# Patient Record
Sex: Male | Born: 1935 | Race: White | Hispanic: No | Marital: Married | Smoking: Former smoker
Health system: Southern US, Community
[De-identification: ages and names within clinical notes are randomized; demographics above are authoritative.]

## PROBLEM LIST (undated history)

## (undated) DIAGNOSIS — C679 Malignant neoplasm of bladder, unspecified: Secondary | ICD-10-CM

## (undated) DIAGNOSIS — Z955 Presence of coronary angioplasty implant and graft: Secondary | ICD-10-CM

## (undated) DIAGNOSIS — N201 Calculus of ureter: Secondary | ICD-10-CM

## (undated) DIAGNOSIS — M51369 Other intervertebral disc degeneration, lumbar region without mention of lumbar back pain or lower extremity pain: Secondary | ICD-10-CM

## (undated) DIAGNOSIS — M5416 Radiculopathy, lumbar region: Secondary | ICD-10-CM

## (undated) DIAGNOSIS — T8859XA Other complications of anesthesia, initial encounter: Secondary | ICD-10-CM

## (undated) DIAGNOSIS — Z87442 Personal history of urinary calculi: Secondary | ICD-10-CM

## (undated) DIAGNOSIS — N200 Calculus of kidney: Secondary | ICD-10-CM

## (undated) DIAGNOSIS — E785 Hyperlipidemia, unspecified: Secondary | ICD-10-CM

## (undated) DIAGNOSIS — G629 Polyneuropathy, unspecified: Secondary | ICD-10-CM

## (undated) DIAGNOSIS — I2510 Atherosclerotic heart disease of native coronary artery without angina pectoris: Secondary | ICD-10-CM

## (undated) DIAGNOSIS — E119 Type 2 diabetes mellitus without complications: Secondary | ICD-10-CM

## (undated) DIAGNOSIS — M5136 Other intervertebral disc degeneration, lumbar region: Secondary | ICD-10-CM

## (undated) DIAGNOSIS — I10 Essential (primary) hypertension: Secondary | ICD-10-CM

## (undated) DIAGNOSIS — F32A Depression, unspecified: Secondary | ICD-10-CM

## (undated) DIAGNOSIS — R29898 Other symptoms and signs involving the musculoskeletal system: Secondary | ICD-10-CM

## (undated) DIAGNOSIS — D649 Anemia, unspecified: Secondary | ICD-10-CM

## (undated) DIAGNOSIS — M1990 Unspecified osteoarthritis, unspecified site: Secondary | ICD-10-CM

## (undated) DIAGNOSIS — Z974 Presence of external hearing-aid: Secondary | ICD-10-CM

## (undated) DIAGNOSIS — K219 Gastro-esophageal reflux disease without esophagitis: Secondary | ICD-10-CM

## (undated) HISTORY — PX: CORONARY ANGIOPLASTY WITH STENT PLACEMENT: SHX49

## (undated) HISTORY — DX: Hyperlipidemia, unspecified: E78.5

## (undated) HISTORY — PX: CARDIOVASCULAR STRESS TEST: SHX262

## (undated) HISTORY — PX: CORONARY ANGIOPLASTY: SHX604

## (undated) HISTORY — PX: LAPAROSCOPIC CHOLECYSTECTOMY: SUR755

## (undated) HISTORY — PX: URETEROLITHOTOMY: SHX71

## (undated) HISTORY — PX: TONSILLECTOMY AND ADENOIDECTOMY: SUR1326

## (undated) HISTORY — DX: Essential (primary) hypertension: I10

## (undated) HISTORY — PX: CARDIAC CATHETERIZATION: SHX172

## (undated) HISTORY — PX: EXTRACORPOREAL SHOCK WAVE LITHOTRIPSY: SHX1557

## (undated) HISTORY — PX: CATARACT EXTRACTION W/ INTRAOCULAR LENS  IMPLANT, BILATERAL: SHX1307

---

## 1998-12-27 ENCOUNTER — Encounter: Admission: RE | Admit: 1998-12-27 | Discharge: 1998-12-27 | Payer: Self-pay | Admitting: Family Medicine

## 1999-01-11 ENCOUNTER — Encounter: Payer: Self-pay | Admitting: Family Medicine

## 1999-01-11 ENCOUNTER — Encounter: Admission: RE | Admit: 1999-01-11 | Discharge: 1999-01-11 | Payer: Self-pay | Admitting: Family Medicine

## 1999-05-14 ENCOUNTER — Inpatient Hospital Stay (HOSPITAL_COMMUNITY): Admission: EM | Admit: 1999-05-14 | Discharge: 1999-05-15 | Payer: Self-pay | Admitting: Family Medicine

## 2000-04-18 ENCOUNTER — Encounter: Payer: Self-pay | Admitting: Cardiology

## 2000-04-18 ENCOUNTER — Inpatient Hospital Stay (HOSPITAL_COMMUNITY): Admission: EM | Admit: 2000-04-18 | Discharge: 2000-04-20 | Payer: Self-pay | Admitting: Emergency Medicine

## 2001-03-05 ENCOUNTER — Ambulatory Visit (HOSPITAL_COMMUNITY): Admission: RE | Admit: 2001-03-05 | Discharge: 2001-03-05 | Payer: Self-pay | Admitting: Gastroenterology

## 2002-01-13 ENCOUNTER — Emergency Department (HOSPITAL_COMMUNITY): Admission: EM | Admit: 2002-01-13 | Discharge: 2002-01-14 | Payer: Self-pay | Admitting: *Deleted

## 2008-10-02 DIAGNOSIS — E785 Hyperlipidemia, unspecified: Secondary | ICD-10-CM | POA: Insufficient documentation

## 2008-10-02 DIAGNOSIS — I10 Essential (primary) hypertension: Secondary | ICD-10-CM | POA: Insufficient documentation

## 2008-10-02 DIAGNOSIS — I77819 Aortic ectasia, unspecified site: Secondary | ICD-10-CM | POA: Insufficient documentation

## 2008-12-25 DIAGNOSIS — K219 Gastro-esophageal reflux disease without esophagitis: Secondary | ICD-10-CM | POA: Insufficient documentation

## 2008-12-25 DIAGNOSIS — E1165 Type 2 diabetes mellitus with hyperglycemia: Secondary | ICD-10-CM | POA: Insufficient documentation

## 2008-12-25 DIAGNOSIS — G629 Polyneuropathy, unspecified: Secondary | ICD-10-CM | POA: Insufficient documentation

## 2008-12-25 DIAGNOSIS — G4700 Insomnia, unspecified: Secondary | ICD-10-CM | POA: Insufficient documentation

## 2009-05-25 DIAGNOSIS — N529 Male erectile dysfunction, unspecified: Secondary | ICD-10-CM | POA: Insufficient documentation

## 2009-05-25 DIAGNOSIS — N200 Calculus of kidney: Secondary | ICD-10-CM | POA: Insufficient documentation

## 2009-05-25 DIAGNOSIS — E669 Obesity, unspecified: Secondary | ICD-10-CM | POA: Insufficient documentation

## 2009-05-25 DIAGNOSIS — E8881 Metabolic syndrome: Secondary | ICD-10-CM | POA: Insufficient documentation

## 2010-03-22 DIAGNOSIS — E1149 Type 2 diabetes mellitus with other diabetic neurological complication: Secondary | ICD-10-CM | POA: Insufficient documentation

## 2011-04-11 DIAGNOSIS — I10 Essential (primary) hypertension: Secondary | ICD-10-CM | POA: Diagnosis not present

## 2011-04-11 DIAGNOSIS — Z9861 Coronary angioplasty status: Secondary | ICD-10-CM | POA: Diagnosis not present

## 2011-04-11 DIAGNOSIS — E785 Hyperlipidemia, unspecified: Secondary | ICD-10-CM | POA: Diagnosis not present

## 2011-04-11 DIAGNOSIS — E109 Type 1 diabetes mellitus without complications: Secondary | ICD-10-CM | POA: Diagnosis not present

## 2011-04-11 DIAGNOSIS — I2510 Atherosclerotic heart disease of native coronary artery without angina pectoris: Secondary | ICD-10-CM | POA: Diagnosis not present

## 2011-05-23 DIAGNOSIS — H9190 Unspecified hearing loss, unspecified ear: Secondary | ICD-10-CM | POA: Insufficient documentation

## 2011-05-25 DIAGNOSIS — C4441 Basal cell carcinoma of skin of scalp and neck: Secondary | ICD-10-CM | POA: Diagnosis not present

## 2011-08-21 DIAGNOSIS — E669 Obesity, unspecified: Secondary | ICD-10-CM | POA: Diagnosis not present

## 2011-08-21 DIAGNOSIS — G4700 Insomnia, unspecified: Secondary | ICD-10-CM | POA: Diagnosis not present

## 2011-08-21 DIAGNOSIS — E1149 Type 2 diabetes mellitus with other diabetic neurological complication: Secondary | ICD-10-CM | POA: Diagnosis not present

## 2011-08-21 DIAGNOSIS — M549 Dorsalgia, unspecified: Secondary | ICD-10-CM | POA: Insufficient documentation

## 2011-08-21 DIAGNOSIS — Z0000 Encounter for general adult medical examination without abnormal findings: Secondary | ICD-10-CM | POA: Diagnosis not present

## 2011-12-26 DIAGNOSIS — Z2839 Other underimmunization status: Secondary | ICD-10-CM | POA: Insufficient documentation

## 2011-12-29 DIAGNOSIS — H40019 Open angle with borderline findings, low risk, unspecified eye: Secondary | ICD-10-CM | POA: Diagnosis not present

## 2011-12-29 DIAGNOSIS — E119 Type 2 diabetes mellitus without complications: Secondary | ICD-10-CM | POA: Diagnosis not present

## 2011-12-29 DIAGNOSIS — Z961 Presence of intraocular lens: Secondary | ICD-10-CM | POA: Diagnosis not present

## 2011-12-29 DIAGNOSIS — H04129 Dry eye syndrome of unspecified lacrimal gland: Secondary | ICD-10-CM | POA: Diagnosis not present

## 2011-12-29 DIAGNOSIS — H524 Presbyopia: Secondary | ICD-10-CM | POA: Diagnosis not present

## 2011-12-29 DIAGNOSIS — E11319 Type 2 diabetes mellitus with unspecified diabetic retinopathy without macular edema: Secondary | ICD-10-CM | POA: Diagnosis not present

## 2012-01-25 DIAGNOSIS — L821 Other seborrheic keratosis: Secondary | ICD-10-CM | POA: Diagnosis not present

## 2012-01-25 DIAGNOSIS — L570 Actinic keratosis: Secondary | ICD-10-CM | POA: Diagnosis not present

## 2012-01-25 DIAGNOSIS — Z85828 Personal history of other malignant neoplasm of skin: Secondary | ICD-10-CM | POA: Diagnosis not present

## 2012-02-19 DIAGNOSIS — M810 Age-related osteoporosis without current pathological fracture: Secondary | ICD-10-CM | POA: Insufficient documentation

## 2012-02-19 DIAGNOSIS — E291 Testicular hypofunction: Secondary | ICD-10-CM | POA: Diagnosis not present

## 2012-02-19 DIAGNOSIS — E1149 Type 2 diabetes mellitus with other diabetic neurological complication: Secondary | ICD-10-CM | POA: Diagnosis not present

## 2012-02-19 DIAGNOSIS — E559 Vitamin D deficiency, unspecified: Secondary | ICD-10-CM | POA: Insufficient documentation

## 2012-03-20 HISTORY — PX: COLONOSCOPY WITH PROPOFOL: SHX5780

## 2012-06-06 DIAGNOSIS — N529 Male erectile dysfunction, unspecified: Secondary | ICD-10-CM | POA: Diagnosis not present

## 2012-06-06 DIAGNOSIS — E559 Vitamin D deficiency, unspecified: Secondary | ICD-10-CM | POA: Diagnosis not present

## 2012-07-25 DIAGNOSIS — L820 Inflamed seborrheic keratosis: Secondary | ICD-10-CM | POA: Diagnosis not present

## 2012-07-25 DIAGNOSIS — C44519 Basal cell carcinoma of skin of other part of trunk: Secondary | ICD-10-CM | POA: Diagnosis not present

## 2012-07-25 DIAGNOSIS — L570 Actinic keratosis: Secondary | ICD-10-CM | POA: Diagnosis not present

## 2012-07-25 DIAGNOSIS — Z85828 Personal history of other malignant neoplasm of skin: Secondary | ICD-10-CM | POA: Diagnosis not present

## 2012-07-25 DIAGNOSIS — L909 Atrophic disorder of skin, unspecified: Secondary | ICD-10-CM | POA: Diagnosis not present

## 2012-07-25 DIAGNOSIS — D1801 Hemangioma of skin and subcutaneous tissue: Secondary | ICD-10-CM | POA: Diagnosis not present

## 2012-07-25 DIAGNOSIS — D485 Neoplasm of uncertain behavior of skin: Secondary | ICD-10-CM | POA: Diagnosis not present

## 2012-07-25 DIAGNOSIS — L821 Other seborrheic keratosis: Secondary | ICD-10-CM | POA: Diagnosis not present

## 2012-08-26 DIAGNOSIS — M810 Age-related osteoporosis without current pathological fracture: Secondary | ICD-10-CM | POA: Diagnosis not present

## 2012-08-26 DIAGNOSIS — E559 Vitamin D deficiency, unspecified: Secondary | ICD-10-CM | POA: Diagnosis not present

## 2012-08-26 DIAGNOSIS — Z125 Encounter for screening for malignant neoplasm of prostate: Secondary | ICD-10-CM | POA: Diagnosis not present

## 2012-08-26 DIAGNOSIS — E1149 Type 2 diabetes mellitus with other diabetic neurological complication: Secondary | ICD-10-CM | POA: Diagnosis not present

## 2012-08-26 DIAGNOSIS — N3941 Urge incontinence: Secondary | ICD-10-CM | POA: Insufficient documentation

## 2012-08-26 DIAGNOSIS — N200 Calculus of kidney: Secondary | ICD-10-CM | POA: Diagnosis not present

## 2012-08-26 DIAGNOSIS — E669 Obesity, unspecified: Secondary | ICD-10-CM | POA: Diagnosis not present

## 2012-08-26 DIAGNOSIS — Z0000 Encounter for general adult medical examination without abnormal findings: Secondary | ICD-10-CM | POA: Diagnosis not present

## 2012-08-26 DIAGNOSIS — E291 Testicular hypofunction: Secondary | ICD-10-CM | POA: Diagnosis not present

## 2012-08-26 DIAGNOSIS — Z1331 Encounter for screening for depression: Secondary | ICD-10-CM | POA: Diagnosis not present

## 2012-09-06 DIAGNOSIS — H40019 Open angle with borderline findings, low risk, unspecified eye: Secondary | ICD-10-CM | POA: Diagnosis not present

## 2012-09-06 DIAGNOSIS — H04129 Dry eye syndrome of unspecified lacrimal gland: Secondary | ICD-10-CM | POA: Diagnosis not present

## 2012-10-21 DIAGNOSIS — M20019 Mallet finger of unspecified finger(s): Secondary | ICD-10-CM | POA: Diagnosis not present

## 2012-10-22 DIAGNOSIS — M20019 Mallet finger of unspecified finger(s): Secondary | ICD-10-CM | POA: Diagnosis not present

## 2012-10-30 DIAGNOSIS — M20019 Mallet finger of unspecified finger(s): Secondary | ICD-10-CM | POA: Diagnosis not present

## 2012-11-12 DIAGNOSIS — M20019 Mallet finger of unspecified finger(s): Secondary | ICD-10-CM | POA: Diagnosis not present

## 2012-12-02 DIAGNOSIS — M20019 Mallet finger of unspecified finger(s): Secondary | ICD-10-CM | POA: Diagnosis not present

## 2012-12-03 DIAGNOSIS — M20019 Mallet finger of unspecified finger(s): Secondary | ICD-10-CM | POA: Diagnosis not present

## 2012-12-17 DIAGNOSIS — K5289 Other specified noninfective gastroenteritis and colitis: Secondary | ICD-10-CM | POA: Diagnosis not present

## 2012-12-17 DIAGNOSIS — E1149 Type 2 diabetes mellitus with other diabetic neurological complication: Secondary | ICD-10-CM | POA: Diagnosis not present

## 2012-12-17 DIAGNOSIS — K219 Gastro-esophageal reflux disease without esophagitis: Secondary | ICD-10-CM | POA: Diagnosis not present

## 2012-12-17 DIAGNOSIS — I10 Essential (primary) hypertension: Secondary | ICD-10-CM | POA: Diagnosis not present

## 2013-01-02 DIAGNOSIS — E119 Type 2 diabetes mellitus without complications: Secondary | ICD-10-CM | POA: Diagnosis not present

## 2013-01-02 DIAGNOSIS — Z961 Presence of intraocular lens: Secondary | ICD-10-CM | POA: Diagnosis not present

## 2013-01-02 DIAGNOSIS — H40019 Open angle with borderline findings, low risk, unspecified eye: Secondary | ICD-10-CM | POA: Diagnosis not present

## 2013-01-20 DIAGNOSIS — IMO0001 Reserved for inherently not codable concepts without codable children: Secondary | ICD-10-CM | POA: Diagnosis not present

## 2013-01-20 DIAGNOSIS — Z23 Encounter for immunization: Secondary | ICD-10-CM | POA: Diagnosis not present

## 2013-01-20 DIAGNOSIS — I10 Essential (primary) hypertension: Secondary | ICD-10-CM | POA: Diagnosis not present

## 2013-01-20 DIAGNOSIS — G589 Mononeuropathy, unspecified: Secondary | ICD-10-CM | POA: Diagnosis not present

## 2013-01-20 DIAGNOSIS — E291 Testicular hypofunction: Secondary | ICD-10-CM | POA: Diagnosis not present

## 2013-01-20 DIAGNOSIS — E1149 Type 2 diabetes mellitus with other diabetic neurological complication: Secondary | ICD-10-CM | POA: Diagnosis not present

## 2013-01-20 DIAGNOSIS — E785 Hyperlipidemia, unspecified: Secondary | ICD-10-CM | POA: Diagnosis not present

## 2013-02-03 DIAGNOSIS — L570 Actinic keratosis: Secondary | ICD-10-CM | POA: Diagnosis not present

## 2013-02-03 DIAGNOSIS — D1801 Hemangioma of skin and subcutaneous tissue: Secondary | ICD-10-CM | POA: Diagnosis not present

## 2013-02-03 DIAGNOSIS — L819 Disorder of pigmentation, unspecified: Secondary | ICD-10-CM | POA: Diagnosis not present

## 2013-02-03 DIAGNOSIS — L821 Other seborrheic keratosis: Secondary | ICD-10-CM | POA: Diagnosis not present

## 2013-02-03 DIAGNOSIS — L738 Other specified follicular disorders: Secondary | ICD-10-CM | POA: Diagnosis not present

## 2013-02-03 DIAGNOSIS — Z85828 Personal history of other malignant neoplasm of skin: Secondary | ICD-10-CM | POA: Diagnosis not present

## 2013-04-15 DIAGNOSIS — I10 Essential (primary) hypertension: Secondary | ICD-10-CM | POA: Diagnosis not present

## 2013-04-15 DIAGNOSIS — Z9861 Coronary angioplasty status: Secondary | ICD-10-CM | POA: Diagnosis not present

## 2013-04-15 DIAGNOSIS — I2510 Atherosclerotic heart disease of native coronary artery without angina pectoris: Secondary | ICD-10-CM | POA: Diagnosis not present

## 2013-04-15 DIAGNOSIS — E109 Type 1 diabetes mellitus without complications: Secondary | ICD-10-CM | POA: Diagnosis not present

## 2013-04-15 DIAGNOSIS — E785 Hyperlipidemia, unspecified: Secondary | ICD-10-CM | POA: Diagnosis not present

## 2013-08-07 DIAGNOSIS — D485 Neoplasm of uncertain behavior of skin: Secondary | ICD-10-CM | POA: Diagnosis not present

## 2013-08-07 DIAGNOSIS — C4441 Basal cell carcinoma of skin of scalp and neck: Secondary | ICD-10-CM | POA: Diagnosis not present

## 2013-08-07 DIAGNOSIS — Z85828 Personal history of other malignant neoplasm of skin: Secondary | ICD-10-CM | POA: Diagnosis not present

## 2013-08-07 DIAGNOSIS — L821 Other seborrheic keratosis: Secondary | ICD-10-CM | POA: Diagnosis not present

## 2013-08-07 DIAGNOSIS — L570 Actinic keratosis: Secondary | ICD-10-CM | POA: Diagnosis not present

## 2013-08-07 DIAGNOSIS — L578 Other skin changes due to chronic exposure to nonionizing radiation: Secondary | ICD-10-CM | POA: Diagnosis not present

## 2013-08-25 DIAGNOSIS — K219 Gastro-esophageal reflux disease without esophagitis: Secondary | ICD-10-CM | POA: Diagnosis not present

## 2013-08-26 ENCOUNTER — Ambulatory Visit (INDEPENDENT_AMBULATORY_CARE_PROVIDER_SITE_OTHER): Payer: BC Managed Care – PPO

## 2013-08-26 ENCOUNTER — Ambulatory Visit (INDEPENDENT_AMBULATORY_CARE_PROVIDER_SITE_OTHER): Payer: BC Managed Care – PPO | Admitting: Podiatry

## 2013-08-26 ENCOUNTER — Encounter: Payer: Self-pay | Admitting: Podiatry

## 2013-08-26 VITALS — BP 114/81 | HR 74 | Resp 16 | Ht 68.0 in | Wt 178.0 lb

## 2013-08-26 DIAGNOSIS — M7750 Other enthesopathy of unspecified foot: Secondary | ICD-10-CM

## 2013-08-26 DIAGNOSIS — M779 Enthesopathy, unspecified: Secondary | ICD-10-CM | POA: Diagnosis not present

## 2013-08-26 DIAGNOSIS — M778 Other enthesopathies, not elsewhere classified: Secondary | ICD-10-CM

## 2013-08-26 DIAGNOSIS — M109 Gout, unspecified: Secondary | ICD-10-CM | POA: Diagnosis not present

## 2013-08-26 NOTE — Progress Notes (Signed)
   Subjective:    Patient ID: Alejandro Foster, male    DOB: February 01, 1934, 80 y.o.   MRN: [id number]  HPI Comments: "I just woke up with my foot like this"  Patient c/o throbbing dorsal left foot for 1 day. He suddenly woke up with his foot red and swollen. No injury the previous day. He can't bend his toes or wear shoes comfortably. He has only tried Advil with no relief.  Foot Pain      Review of Systems  HENT: Positive for hearing loss.   All other systems reviewed and are negative.      Objective:   Physical Exam: I have reviewed his past medical history medications allergies surgeries social history and review of systems. Pulses are strongly palpable bilateral. Neurologic sensorium is intact per since once the monofilament. Deep tendon reflexes are intact bilateral. Muscle strength is intact in normal for his age. Orthopedic evaluation demonstrates all joints distal to the ankle a full range of motion without crepitation. He has swelling with overlying erythema first metatarsal medial cuneiform joint which is warm on palpation and painful to the touch with the area of hyperintense erythema demonstrating the majority of the pain. This appears to be the nidus. Radiographic evaluation does not demonstrate any type of osseous abnormalities in this area.        Assessment & Plan:  Assessment: Acute gouty capsulitis first metatarsal medial cuneiform joint left foot possibly associated with renal issues from diabetes or possibly associated with hydrochlorothiazide.  Plan: Injected the site today with Kenalog and local anesthetic. I will followup with him in a few weeks.

## 2013-08-28 DIAGNOSIS — M109 Gout, unspecified: Secondary | ICD-10-CM | POA: Insufficient documentation

## 2013-08-28 DIAGNOSIS — H6120 Impacted cerumen, unspecified ear: Secondary | ICD-10-CM | POA: Diagnosis not present

## 2013-08-28 DIAGNOSIS — N3941 Urge incontinence: Secondary | ICD-10-CM | POA: Diagnosis not present

## 2013-08-28 DIAGNOSIS — E871 Hypo-osmolality and hyponatremia: Secondary | ICD-10-CM | POA: Insufficient documentation

## 2013-08-28 DIAGNOSIS — Z1331 Encounter for screening for depression: Secondary | ICD-10-CM | POA: Diagnosis not present

## 2013-08-28 DIAGNOSIS — M810 Age-related osteoporosis without current pathological fracture: Secondary | ICD-10-CM | POA: Diagnosis not present

## 2013-08-28 DIAGNOSIS — E1149 Type 2 diabetes mellitus with other diabetic neurological complication: Secondary | ICD-10-CM | POA: Diagnosis not present

## 2013-08-28 DIAGNOSIS — E559 Vitamin D deficiency, unspecified: Secondary | ICD-10-CM | POA: Diagnosis not present

## 2013-08-28 DIAGNOSIS — Z0000 Encounter for general adult medical examination without abnormal findings: Secondary | ICD-10-CM | POA: Diagnosis not present

## 2013-09-04 DIAGNOSIS — J069 Acute upper respiratory infection, unspecified: Secondary | ICD-10-CM | POA: Diagnosis not present

## 2013-09-04 DIAGNOSIS — H6120 Impacted cerumen, unspecified ear: Secondary | ICD-10-CM | POA: Diagnosis not present

## 2013-09-09 ENCOUNTER — Ambulatory Visit: Payer: BC Managed Care – PPO | Admitting: Podiatry

## 2013-09-30 DIAGNOSIS — K219 Gastro-esophageal reflux disease without esophagitis: Secondary | ICD-10-CM | POA: Diagnosis not present

## 2013-11-04 DIAGNOSIS — Z961 Presence of intraocular lens: Secondary | ICD-10-CM | POA: Diagnosis not present

## 2013-11-04 DIAGNOSIS — E119 Type 2 diabetes mellitus without complications: Secondary | ICD-10-CM | POA: Diagnosis not present

## 2014-02-05 DIAGNOSIS — H6123 Impacted cerumen, bilateral: Secondary | ICD-10-CM | POA: Diagnosis not present

## 2014-02-05 DIAGNOSIS — H9312 Tinnitus, left ear: Secondary | ICD-10-CM | POA: Diagnosis not present

## 2014-02-23 DIAGNOSIS — L821 Other seborrheic keratosis: Secondary | ICD-10-CM | POA: Diagnosis not present

## 2014-02-23 DIAGNOSIS — L918 Other hypertrophic disorders of the skin: Secondary | ICD-10-CM | POA: Diagnosis not present

## 2014-02-23 DIAGNOSIS — L820 Inflamed seborrheic keratosis: Secondary | ICD-10-CM | POA: Diagnosis not present

## 2014-02-23 DIAGNOSIS — Z85828 Personal history of other malignant neoplasm of skin: Secondary | ICD-10-CM | POA: Diagnosis not present

## 2014-02-23 DIAGNOSIS — L853 Xerosis cutis: Secondary | ICD-10-CM | POA: Diagnosis not present

## 2014-02-23 DIAGNOSIS — L570 Actinic keratosis: Secondary | ICD-10-CM | POA: Diagnosis not present

## 2014-02-24 DIAGNOSIS — E1149 Type 2 diabetes mellitus with other diabetic neurological complication: Secondary | ICD-10-CM | POA: Diagnosis not present

## 2014-02-24 DIAGNOSIS — E1165 Type 2 diabetes mellitus with hyperglycemia: Secondary | ICD-10-CM | POA: Diagnosis not present

## 2014-02-24 DIAGNOSIS — I10 Essential (primary) hypertension: Secondary | ICD-10-CM | POA: Diagnosis not present

## 2014-02-24 DIAGNOSIS — Z6828 Body mass index (BMI) 28.0-28.9, adult: Secondary | ICD-10-CM | POA: Diagnosis not present

## 2014-02-24 DIAGNOSIS — J069 Acute upper respiratory infection, unspecified: Secondary | ICD-10-CM | POA: Diagnosis not present

## 2014-02-24 DIAGNOSIS — E871 Hypo-osmolality and hyponatremia: Secondary | ICD-10-CM | POA: Diagnosis not present

## 2014-02-24 DIAGNOSIS — G4700 Insomnia, unspecified: Secondary | ICD-10-CM | POA: Diagnosis not present

## 2014-02-24 DIAGNOSIS — E785 Hyperlipidemia, unspecified: Secondary | ICD-10-CM | POA: Diagnosis not present

## 2014-07-02 ENCOUNTER — Ambulatory Visit: Payer: Medicare Other

## 2014-07-17 ENCOUNTER — Ambulatory Visit (INDEPENDENT_AMBULATORY_CARE_PROVIDER_SITE_OTHER): Payer: Medicare Other

## 2014-07-17 DIAGNOSIS — M79673 Pain in unspecified foot: Secondary | ICD-10-CM

## 2014-07-17 DIAGNOSIS — B351 Tinea unguium: Secondary | ICD-10-CM

## 2014-07-21 NOTE — Progress Notes (Signed)
HPI Presents today chief complaint of painful elongated toenails.  Objective: Pulses are palpable bilateral nails are thick, yellow dystrophic onychomycosis and painful palpation.   Assessment: Onychomycosis with pain in limb.  Plan: Treatment of nails in thickness and length as covered service secondary to pain.  

## 2014-08-26 DIAGNOSIS — Z0000 Encounter for general adult medical examination without abnormal findings: Secondary | ICD-10-CM | POA: Insufficient documentation

## 2014-09-03 DIAGNOSIS — Z794 Long term (current) use of insulin: Secondary | ICD-10-CM | POA: Insufficient documentation

## 2014-09-03 DIAGNOSIS — I493 Ventricular premature depolarization: Secondary | ICD-10-CM | POA: Insufficient documentation

## 2014-09-03 DIAGNOSIS — I119 Hypertensive heart disease without heart failure: Secondary | ICD-10-CM | POA: Insufficient documentation

## 2014-11-13 ENCOUNTER — Encounter: Payer: Self-pay | Admitting: Podiatry

## 2014-11-13 ENCOUNTER — Ambulatory Visit: Payer: Medicare Other

## 2014-11-13 ENCOUNTER — Ambulatory Visit (INDEPENDENT_AMBULATORY_CARE_PROVIDER_SITE_OTHER): Payer: Medicare Other | Admitting: Podiatry

## 2014-11-13 VITALS — BP 141/72 | HR 66 | Resp 17

## 2014-11-13 DIAGNOSIS — B351 Tinea unguium: Secondary | ICD-10-CM

## 2014-11-13 DIAGNOSIS — M79673 Pain in unspecified foot: Secondary | ICD-10-CM

## 2014-11-13 NOTE — Progress Notes (Signed)
Patient ID: Alejandro Foster, male   DOB: [birth year], 81 y.o.   MRN: [id number] Complaint:  Visit Type: Patient returns to my office for continued preventative foot care services. Complaint: Patient states" my nails have grown long and thick and become painful to walk and wear shoes" Patient has been diagnosed with DM and is taking gabapentin for his neuropathy.. The patient presents for preventative foot care services. No changes to ROS  Podiatric Exam: Vascular: dorsalis pedis and posterior tibial pulses are palpable bilateral. Capillary return is immediate. Temperature gradient is WNL. Skin turgor WNL  Sensorium: Diminished Semmes Weinstein monofilament test. Normal tactile sensation bilaterally. Nail Exam: Pt has thick disfigured discolored nails with subungual debris noted bilateral entire nail hallux  Ulcer Exam: There is no evidence of ulcer or pre-ulcerative changes or infection. Orthopedic Exam: Muscle tone and strength are WNL. No limitations in general ROM. No crepitus or effusions noted. Foot type and digits show no abnormalities. Bony prominences are unremarkable. Skin: No Porokeratosis. No infection or ulcers  Diagnosis:  Onychomycosis, , Pain in right toe, pain in left toes  Treatment & Plan Procedures and Treatment: Consent by patient was obtained for treatment procedures. The patient understood the discussion of treatment and procedures well. All questions were answered thoroughly reviewed. Debridement of mycotic and hypertrophic toenails, 1 through 5 bilateral and clearing of subungual debris. No ulceration, no infection noted.  Return Visit-Office Procedure: Patient instructed to return to the office for a follow up visit 3 months for continued evaluation and treatment.

## 2015-03-04 DIAGNOSIS — L570 Actinic keratosis: Secondary | ICD-10-CM | POA: Diagnosis not present

## 2015-03-04 DIAGNOSIS — Z85828 Personal history of other malignant neoplasm of skin: Secondary | ICD-10-CM | POA: Diagnosis not present

## 2015-03-04 DIAGNOSIS — L821 Other seborrheic keratosis: Secondary | ICD-10-CM | POA: Diagnosis not present

## 2015-03-10 ENCOUNTER — Ambulatory Visit: Payer: Medicare Other | Admitting: Podiatry

## 2015-08-12 ENCOUNTER — Other Ambulatory Visit: Payer: Self-pay | Admitting: Orthopedic Surgery

## 2015-08-12 DIAGNOSIS — M48061 Spinal stenosis, lumbar region without neurogenic claudication: Secondary | ICD-10-CM

## 2015-08-22 ENCOUNTER — Ambulatory Visit
Admission: RE | Admit: 2015-08-22 | Discharge: 2015-08-22 | Disposition: A | Discharge status: Home / Self Care | Payer: Medicare Other | Source: Ambulatory Visit | Attending: Orthopedic Surgery | Admitting: Orthopedic Surgery

## 2015-08-22 DIAGNOSIS — M48061 Spinal stenosis, lumbar region without neurogenic claudication: Secondary | ICD-10-CM

## 2016-01-19 ENCOUNTER — Emergency Department (HOSPITAL_COMMUNITY): Payer: Medicare Other

## 2016-01-19 ENCOUNTER — Encounter (HOSPITAL_COMMUNITY): Payer: Self-pay | Admitting: *Deleted

## 2016-01-19 ENCOUNTER — Emergency Department (HOSPITAL_COMMUNITY)
Admission: EM | Admit: 2016-01-19 | Discharge: 2016-01-19 | Disposition: A | Discharge status: Home / Self Care | Payer: Medicare Other | Attending: Emergency Medicine | Admitting: Emergency Medicine

## 2016-01-19 DIAGNOSIS — E119 Type 2 diabetes mellitus without complications: Secondary | ICD-10-CM | POA: Diagnosis not present

## 2016-01-19 DIAGNOSIS — Z791 Long term (current) use of non-steroidal anti-inflammatories (NSAID): Secondary | ICD-10-CM | POA: Insufficient documentation

## 2016-01-19 DIAGNOSIS — R109 Unspecified abdominal pain: Secondary | ICD-10-CM | POA: Diagnosis present

## 2016-01-19 DIAGNOSIS — Z794 Long term (current) use of insulin: Secondary | ICD-10-CM | POA: Diagnosis not present

## 2016-01-19 DIAGNOSIS — Z87891 Personal history of nicotine dependence: Secondary | ICD-10-CM | POA: Insufficient documentation

## 2016-01-19 DIAGNOSIS — I10 Essential (primary) hypertension: Secondary | ICD-10-CM | POA: Insufficient documentation

## 2016-01-19 DIAGNOSIS — Z79899 Other long term (current) drug therapy: Secondary | ICD-10-CM | POA: Diagnosis not present

## 2016-01-19 DIAGNOSIS — N202 Calculus of kidney with calculus of ureter: Secondary | ICD-10-CM | POA: Diagnosis not present

## 2016-01-19 DIAGNOSIS — N200 Calculus of kidney: Secondary | ICD-10-CM

## 2016-01-19 LAB — URINALYSIS, ROUTINE W REFLEX MICROSCOPIC
BILIRUBIN URINE: NEGATIVE
Glucose, UA: NEGATIVE mg/dL
KETONES UR: NEGATIVE mg/dL
NITRITE: NEGATIVE
Protein, ur: NEGATIVE mg/dL
Specific Gravity, Urine: 1.017 (ref 1.005–1.030)
pH: 7 (ref 5.0–8.0)

## 2016-01-19 LAB — COMPREHENSIVE METABOLIC PANEL
ALBUMIN: 4.6 g/dL (ref 3.5–5.0)
ALK PHOS: 27 U/L — AB (ref 38–126)
ALT: 24 U/L (ref 17–63)
AST: 23 U/L (ref 15–41)
Anion gap: 6 (ref 5–15)
BILIRUBIN TOTAL: 1.4 mg/dL — AB (ref 0.3–1.2)
BUN: 20 mg/dL (ref 6–20)
CALCIUM: 9.9 mg/dL (ref 8.9–10.3)
CO2: 25 mmol/L (ref 22–32)
Chloride: 108 mmol/L (ref 101–111)
Creatinine, Ser: 1.08 mg/dL (ref 0.61–1.24)
GFR calc Af Amer: >60 mL/min (ref >60)
GFR calc non Af Amer: >60 mL/min (ref >60)
GLUCOSE: 106 mg/dL — AB (ref 65–99)
Potassium: 3.9 mmol/L (ref 3.5–5.1)
Sodium: 139 mmol/L (ref 135–145)
TOTAL PROTEIN: 7.7 g/dL (ref 6.5–8.1)

## 2016-01-19 LAB — CBC
HCT: 41.4 % (ref 39.0–52.0)
Hemoglobin: 14.2 g/dL (ref 13.0–17.0)
MCH: 28.9 pg (ref 26.0–34.0)
MCHC: 34.3 g/dL (ref 30.0–36.0)
MCV: 84.1 fL (ref 78.0–100.0)
PLATELETS: 226 10*3/uL (ref 150–400)
RBC: 4.92 MIL/uL (ref 4.22–5.81)
RDW: 13.6 % (ref 11.5–15.5)
WBC: 9.7 10*3/uL (ref 4.0–10.5)

## 2016-01-19 LAB — URINE MICROSCOPIC-ADD ON
Bacteria, UA: NONE SEEN
SQUAMOUS EPITHELIAL / LPF: NONE SEEN

## 2016-01-19 LAB — LIPASE, BLOOD: Lipase: 30 U/L (ref 11–51)

## 2016-01-19 MED ORDER — ONDANSETRON 4 MG PO TBDP: 4.0000 mg | ORAL_TABLET | Freq: Three times a day (TID) | ORAL | 0 refills | Status: DC | PRN

## 2016-01-19 MED ORDER — HYDROCODONE-ACETAMINOPHEN 5-325 MG PO TABS: 1.0000 | ORAL_TABLET | ORAL | 0 refills | Status: DC | PRN

## 2016-01-19 NOTE — Discharge Instructions (Signed)
Take Vicodin as needed for pain. Zofran as needed for nausea.  Strain your  urine.  Call Dr. Junious Silk for follow-up appointment if not passed in 7 days.

## 2016-01-19 NOTE — ED Provider Notes (Signed)
[facility] DEPT Provider Note   CSN: [id number] Arrival date & time: 01/19/16  1600     History   Chief Complaint Chief Complaint  Patient presents with  . Abdominal Pain    HPI Alejandro Foster is a 82 y.o. male. Patient presents for evaluation of right flank pain. States his symptoms started yesterday morning upon awakening. He's had kidney stones and states is been over 20 years since he had an episode. One loose stool today which he thinks is "unrelated". No nausea vomiting. He took a Tylenol 3 at home that he had from previous dental procedure states his pain went from a 7 or 8 down to a 3 or 4/10.  HPI  Past Medical History:  Diagnosis Date  . Diabetes mellitus without complication ([place])   . Hyperlipidemia   . Hypertension     There are no active problems to display for this patient.   History reviewed. No pertinent surgical history.     Home Medications    Prior to Admission medications   Medication Sig Start Date End Date Taking? Authorizing Provider  alendronate (FOSAMAX) 70 MG tablet Take 70 mg by mouth every Monday. Take with a full glass of water on an empty stomach.   Yes Historical Provider, MD  amLODipine (NORVASC) 5 MG tablet Take 5 mg by mouth every evening.    Yes Historical Provider, MD  atorvastatin (LIPITOR) 20 MG tablet Take 20 mg by mouth every evening.    Yes Historical Provider, MD  Calcium Citrate 250 MG TABS Take 250 mg by mouth daily.   Yes Historical Provider, MD  cetirizine (ZYRTEC) 10 MG tablet Take 10 mg by mouth daily.   Yes Historical Provider, MD  Cholecalciferol (VITAMIN D PO) Take 5 drops by mouth every evening.    Yes Historical Provider, MD  [facility] EVENING PRIMROSE OIL PO Take 1 capsule by mouth 3 (three) times daily.   Yes Historical Provider, MD  fenofibrate (TRICOR) 48 MG tablet Take 48 mg by mouth every evening.    Yes Historical Provider, MD  gabapentin (NEURONTIN) 100 MG capsule Take 100-200 mg by mouth 2 (two) times daily.  Take 100mg  at noon and then 200mg  at night   Yes Historical Provider, MD  glipiZIDE-metformin (METAGLIP) 2.5-500 MG per tablet Take 2 tablets by mouth 2 (two) times daily before a meal.    Yes Historical Provider, MD  HUMALOG MIX 75/25 KWIKPEN (75-25) 100 UNIT/ML Kwikpen Inject 30-35 Units into the skin 2 (two) times daily. 35 units in the morning and then 30 units in the evening. 12/31/15  Yes Historical Provider, MD  hydrochlorothiazide (HYDRODIURIL) 25 MG tablet Take 25 mg by mouth daily.   Yes Historical Provider, MD  ibuprofen (ADVIL,MOTRIN) 200 MG tablet Take 200 mg by mouth every 6 (six) hours as needed for headache or moderate pain.    Yes Historical Provider, MD  MAGNESIUM CITRATE PO Take 150 mg by mouth every evening.    Yes Historical Provider, MD  metoprolol tartrate (LOPRESSOR) 25 MG tablet Take 25 mg by mouth daily.   Yes Historical Provider, MD  omega-3 acid ethyl esters (LOVAZA) 1 G capsule Take 1 g by mouth 2 (two) times daily.    Yes Historical Provider, MD  OVER THE COUNTER MEDICATION Take 1-2 capsules by mouth 3 (three) times daily. EFAC pillsTake 1 capsule in the am and at noon and then 2capsules at night   Yes Historical Provider, MD  Pease Take 2  capsules by mouth daily. Prostinol- otc medication   Yes Historical Provider, MD  ramipril (ALTACE) 10 MG capsule Take 20 mg by mouth daily.    Yes Historical Provider, MD  ranitidine (ZANTAC) 150 MG tablet Take 150 mg by mouth at bedtime.    Yes Historical Provider, MD  solifenacin (VESICARE) 5 MG tablet Take 5 mg by mouth daily.   Yes Historical Provider, MD  zolpidem (AMBIEN) 10 MG tablet Take 10 mg by mouth at bedtime.   Yes Historical Provider, MD  HYDROcodone-acetaminophen (NORCO/VICODIN) 5-325 MG tablet Take 1 tablet by mouth every 4 (four) hours as needed. 01/19/16   Tanna Furry, MD  ondansetron (ZOFRAN ODT) 4 MG disintegrating tablet Take 1 tablet (4 mg total) by mouth every 8 (eight) hours as needed for  nausea. 01/19/16   Tanna Furry, MD    Family History No family history on file.  Social History Social History  Substance Use Topics  . Smoking status: Former Smoker    Quit date: 08/26/1988  . Smokeless tobacco: Never Used  . Alcohol use No     Allergies   Sulfa antibiotics   Review of Systems Review of Systems  Constitutional: Negative for appetite change, chills, diaphoresis, fatigue and fever.  HENT: Negative for mouth sores, sore throat and trouble swallowing.   Eyes: Negative for visual disturbance.  Respiratory: Negative for cough, chest tightness, shortness of breath and wheezing.   Cardiovascular: Negative for chest pain.  Gastrointestinal: Positive for nausea. Negative for abdominal distention, abdominal pain, diarrhea and vomiting.  Endocrine: Negative for polydipsia, polyphagia and polyuria.  Genitourinary: Positive for flank pain. Negative for dysuria, frequency and hematuria.  Musculoskeletal: Negative for gait problem.  Skin: Negative for color change, pallor and rash.  Neurological: Negative for dizziness, syncope, light-headedness and headaches.  Hematological: Does not bruise/bleed easily.  Psychiatric/Behavioral: Negative for behavioral problems and confusion.     Physical Exam Updated Vital Signs BP 126/56 (BP Location: Left Arm)   Pulse 63   Temp 97.9 F (36.6 C) (Oral)   Resp 18   SpO2 95%   Physical Exam  Constitutional: He is oriented to person, place, and time. He appears well-developed and well-nourished. No distress.  HENT:  Head: Normocephalic.  Eyes: Conjunctivae are normal. Pupils are equal, round, and reactive to light. No scleral icterus.  Neck: Normal range of motion. Neck supple. No thyromegaly present.  Cardiovascular: Normal rate and regular rhythm.  Exam reveals no gallop and no friction rub.   No murmur heard. Pulmonary/Chest: Effort normal and breath sounds normal. No respiratory distress. He has no wheezes. He has no rales.    Abdominal: Soft. Bowel sounds are normal. He exhibits no distension. There is no tenderness. There is no rebound.    Musculoskeletal: Normal range of motion.  Neurological: He is alert and oriented to person, place, and time.  Skin: Skin is warm and dry. No rash noted.  Psychiatric: He has a normal mood and affect. His behavior is normal.     ED Treatments / Results  Labs (all labs ordered are listed, but only abnormal results are displayed) Labs Reviewed  COMPREHENSIVE METABOLIC PANEL - Abnormal; Notable for the following:       Result Value   Glucose, Bld 106 (*)    Alkaline Phosphatase 27 (*)    Total Bilirubin 1.4 (*)    All other components within normal limits  URINALYSIS, ROUTINE W REFLEX MICROSCOPIC (NOT AT [facility]) - Abnormal; Notable for the following:  Hgb urine dipstick TRACE (*)    Leukocytes, UA SMALL (*)    All other components within normal limits  LIPASE, BLOOD  CBC  URINE MICROSCOPIC-ADD ON    EKG  EKG Interpretation None       Radiology Ct Renal Stone Study  Result Date: 01/19/2016 CLINICAL DATA:  Right lower quadrant pain since yesterday morning, diarrhea and history of kidney stones. EXAM: CT ABDOMEN AND PELVIS WITHOUT CONTRAST TECHNIQUE: Multidetector CT imaging of the abdomen and pelvis was performed following the standard protocol without IV contrast. COMPARISON:  None. FINDINGS: Lower chest: Lung bases show no acute findings. Coronary artery calcification. Heart size normal. No pericardial or pleural effusion. Hepatobiliary: Liver is unremarkable. Cholecystectomy. No biliary ductal dilatation. Pancreas: Negative. Spleen: Negative. Adrenals/Urinary Tract: Adrenal glands are unremarkable. Tiny stones are seen in the kidneys. Low and high attenuation lesions in the left kidney measure up to 3.2 cm on the left, likely a combination of simple and hyperdense cysts but definitive characterization is limited without post-contrast imaging. Right renal edema  with mild right hydronephrosis secondary to a 6 mm proximal right ureteral stone. Ureters are otherwise decompressed. Bladder is unremarkable. Stomach/Bowel: Stomach, small bowel, appendix and colon are unremarkable. Vascular/Lymphatic: Atherosclerotic calcification of the arterial vasculature without abdominal aortic aneurysm. No pathologically enlarged lymph nodes. Reproductive: Prostate is normal in size. Other: No free fluid. Mesenteries and peritoneum are otherwise unremarkable. Musculoskeletal: No worrisome lytic or sclerotic lesions. Degenerative changes are seen in the spine. IMPRESSION: 1. Right renal edema and mild right hydronephrosis secondary to a 6 mm stone in the proximal right ureter. 2. Bilateral renal stones. 3. Aortic atherosclerosis (ICD10-170.0). Coronary artery calcification. Electronically Signed   By: Lorin Picket M.D.   On: 01/19/2016 17:09    Procedures Procedures (including critical care time)  Medications Ordered in ED Medications - No data to display   Initial Impression / Assessment and Plan / ED Course  I have reviewed the triage vital signs and the nursing notes.  Pertinent labs & imaging results that were available during my care of the patient were reviewed by me and considered in my medical decision making (see chart for details).  Clinical Course    CT scan shows proximal ureteral stone. 6 mm. Normal kidney function. No infection. Afebrile. Tolerating symptoms well. For discharge. He has a urologist Dr. Junious Silk. It hurts him to follow-up next week for outpatient appointment.  Final Clinical Impressions(s) / ED Diagnoses   Final diagnoses:  Kidney stone    New Prescriptions Discharge Medication List as of 01/19/2016  6:26 PM       Tanna Furry, MD 01/19/16 1845

## 2016-01-19 NOTE — ED Triage Notes (Addendum)
Per EMS, pt complains of RLQ abdominal pain since yesterday morning, 1 episode of diarrhea today. Pt has hx of kidney stones. Pt denies n/v at this time.   Pt took pain medication at 230PM today. Pain was 7/10 before pain medication, is now 4/10.

## 2016-03-01 ENCOUNTER — Other Ambulatory Visit: Payer: Self-pay | Admitting: Urology

## 2016-03-01 DIAGNOSIS — N201 Calculus of ureter: Secondary | ICD-10-CM

## 2016-03-02 ENCOUNTER — Other Ambulatory Visit: Payer: Self-pay | Admitting: Urology

## 2016-03-24 ENCOUNTER — Encounter (HOSPITAL_BASED_OUTPATIENT_CLINIC_OR_DEPARTMENT_OTHER): Payer: Self-pay | Admitting: *Deleted

## 2016-03-24 NOTE — Progress Notes (Addendum)
NPO AFTER MN.  ARRIVE AT 1015.  NEEDS ISTAT 8 AND KUB.  CURRENT EKG, LOV, STRESS TEST TO BE FAXED FROM DR Wynonia Lawman.  WILL TAKE METOPROLOL AM DOS W/ SIPS OF WATER AND IF NEEDED TAKE PRN PAIN MED. , ONE TYPE, AND ZOFRAN. PT VERBALIZED UNDERSTANDING , WILL DO HALF HUMALOG INSULIN DOSE HS BEFORE DOS , 15 UNITS.  ADDENDUM:  RECEIVED LOV NOTE, STRESS TEST AND CURRENT EKG, PLACED IN CHART.

## 2016-03-27 ENCOUNTER — Encounter (HOSPITAL_BASED_OUTPATIENT_CLINIC_OR_DEPARTMENT_OTHER): Payer: Self-pay | Admitting: *Deleted

## 2016-03-27 NOTE — H&P (Signed)
Office Visit Report 02/24/2016    Alejandro Foster         MRN: [id number]  PRIMARY CARE:  Precious Reel, MD  DOB: [birth year], 83 year old Male  REFERRING:  Georgette Dover, MD  SSN: [id number]  PROVIDER:  Festus Aloe, M.D.    TREATING:  Jimmey Ralph    LOCATION:  [facility] [phone]    CC: I have ureteral stone.  HPI: Alejandro Foster is a 83 year-old male established patient who is here for ureteral stone.  The problem is on the right side. He first stated noticing pain on approximately 01/20/2016. This is not his first kidney stone. He is currently having groin pain. He denies having flank pain, back pain, nausea, vomiting, fever, and chills. Pain is occuring on the right side. He has not caught a stone in his urine strainer since his symptoms began.   He has had eswl, ureteral stent, and ureteroscopy for treatment of his stones in the past.     ALLERGIES: Sulfa Drugs - Other Reaction, childhood   MEDICATIONS: Tamsulosin Hcl 0.4 mg capsule, ext release 24 hr 1 capsule PO Q HS  Vesicare 5 mg tablet 1 tablet PO Daily  Alendronate Sodium 70 MG Oral Tablet Oral  AmLODIPine Besylate 5 MG Oral Tablet Oral  Atorvastatin Calcium 20 MG Oral Tablet Oral  Fenofibrate CAPS Oral  Gabapentin 100 MG Oral Capsule Oral  GlipiZIDE-MetFORMIN HCl - 2.5-500 MG Oral Tablet Oral  HumaLOG SOLN Subcutaneous  HydroCHLOROthiazide 25 MG Oral Tablet Oral  Magnesium TABS Oral  Metoprolol Tartrate 25 MG Oral Tablet Oral  Omega-3 CAPS Oral  Prostaglandin E1 100 % powder 1 ml Intracavernosal Daily  Ramipril 10 MG Oral Capsule Oral  RaNITidine HCl - 150 MG Oral Capsule Oral  Tylenol 325 mg tablet  Vitamin D LIQD Oral  Zolpidem Tartrate 10 MG Oral Tablet Oral  Zyrtec TABS Oral    GU PSH: Cystoscopy - 11/10/2015    NON-GU PSH: Cholecystectomy - Jul 23, 2015        GU PMH: Calculus Ureter - 01/25/2016 Kidney Stone - 01/25/2016 ED, arterial insufficiency - 11/10/2015, Erectile dysfunction  due to arterial insufficiency, - 2015-07-23 Urinary Urgency - 11/10/2015 Bladder Cancer, History, History of bladder cancer - 07/23/2015 Personal Hx urinary calculi, History of renal calculi - 07-23-15 Urge incontinence, Urge incontinence of urine - 07/23/2015    NON-GU PMH: Encounter for general adult medical examination without abnormal findings, Encounter for preventive health examination - 2015/07/23 Personal history of other diseases of the circulatory system, History of hypertension - 07/23/2015, History of cardiac disorder, - Jul 23, 2015 Personal history of other diseases of the digestive system, History of esophageal reflux - Jul 23, 2015 Personal history of other diseases of the musculoskeletal system and connective tissue, History of arthritis - 07/23/15 Personal history of other endocrine, nutritional and metabolic disease, History of hypercholesterolemia - 2015/07/23, History of diabetes mellitus, - 23-Jul-2015    FAMILY HISTORY: Death - Mother, Father Kidney Stones - Runs In Family malignant neoplasm of prostate - Runs In Family renal failure - Runs In Family   SOCIAL HISTORY: Marital Status: Married Current Smoking Status: Patient does not smoke anymore.  Drinks 1 drink per month.  Drinks 2 caffeinated drinks per day. Patient's occupation is/was retired.     Notes: Three children   REVIEW OF SYSTEMS:     GU Review Male:  Patient reports get up at night to urinate and erection problems. Patient denies frequent urination,  hard to postpone urination, burning/ pain with urination, leakage of urine, stream starts and stops, trouble starting your stream, have to strain to urinate , and penile pain.    Gastrointestinal (Upper):  Patient denies nausea, vomiting, and indigestion/ heartburn.    Gastrointestinal (Lower):  Patient denies diarrhea and constipation.    Constitutional:  Patient reports night sweats and fatigue. Patient denies fever and weight loss.    Skin:  Patient denies skin rash/ lesion and  itching.    Eyes:  Patient denies double vision and blurred vision.    Ears/ Nose/ Throat:  Patient denies sore throat and sinus problems.    Hematologic/Lymphatic:  Patient denies swollen glands and easy bruising.    Cardiovascular:  Patient denies leg swelling and chest pains.    Respiratory:  Patient denies cough and shortness of breath.    Endocrine:  Patient denies excessive thirst.    Musculoskeletal:  Patient reports back pain. Patient denies joint pain.    Neurological:  Patient denies headaches and dizziness.    Psychologic:  Patient denies depression and anxiety.    VITAL SIGNS:       02/24/2016 09:38 AM     BP 126/71 mmHg     Pulse 58 /min     Temperature 96.6 F / 36 C     MULTI-SYSTEM PHYSICAL EXAMINATION:      Constitutional: Well-nourished. No physical deformities. Normally developed. Good grooming.      Respiratory: No labored breathing, no use of accessory muscles.      Cardiovascular: Normal temperature, normal extremity pulses, no swelling, no varicosities.      Neurologic / Psychiatric: Oriented to time, oriented to place, oriented to person. No depression, no anxiety, no agitation.      Gastrointestinal: No mass, no tenderness, no rigidity, non obese abdomen.             PAST DATA REVIEWED:   Source Of History:  Patient  Records Review:  Previous Patient Records  Urine Test Review:  Urinalysis  X-Ray Review: KUB: Reviewed Films.     PROCEDURES:    KUB - 74000  A single view of the abdomen is obtained.      Distal right ureteral stone appears unchanged from previous KUB.     Urinalysis w/Scope  Dipstick Dipstick Cont'd Micro  Color: Yellow Bilirubin: Neg WBC/hpf: 20 - 40/hpf  Appearance: Cloudy Ketones: Neg RBC/hpf: 0 - 2/hpf  Specific Gravity: 1.025 Blood: Neg Bacteria: Rare (0-9/hpf)  pH: <=5.0 Protein: Neg Cystals: NS (Not Seen)  Glucose: 3+ Urobilinogen: 0.2 Casts: NS (Not Seen)   Nitrites: Neg Trichomonas: Not Present   Leukocyte Esterase: Neg  Mucous: Not Present    Epithelial Cells: 0 - 5/hpf    Yeast: NS (Not Seen)    Sperm: Not Present    ASSESSMENT:     ICD-10 Details  1 GU:  Calculus Ureter - N20.1 Right, Culture urine. Empirically begin Cephalexin 500 mg 1 po BID X 7 days till culture complete. Distal right ureteral stone appears unchanged from previous KUB. Will have Dr. Junious Silk review KUB and call pt to discuss either ESWL vs cystourethroscopy, (R) RPG, stone extraction. Pt has now failed MET.    PLAN:   Medications  New Meds: Cephalexin 500 mg capsule 1 capsule PO BID #14 0 Refill(s)  Tramadol Hcl 50 mg tablet 1 tablet PO Q 6 H PRN #30 0 Refill(s)    Stop Meds: Ondansetron Odt 4 mg tablet,disintegrating 1 tablet Sublingual Q 8  H Start: 01/25/2016  Discontinue: 02/24/2016 - Reason: The medication cycle was completed.  Tramadol Hcl 50 mg tablet 1 tablet PO Q 6 H PRN Start: 02/23/2016  Discontinue: 02/24/2016 - Reason: The medication cycle was completed.    Orders  Labs Urine Culture and Sensitivity  Document  Letter(s):  Created for Patient: Clinical Summary   Notes:  Will have Dr. Junious Silk review KUB and call pt to discuss either ESWL vs cystourethroscopy, (R) RPG, stone extraction. Pt has now failed MET.    E & M CODE: I spent at least 25 minutes face to face with the patient, more than 50% of that time was spent on counseling and/or coordinating care.   * Signed by Jimmey Ralph on 02/24/16 at 9:58 AM (EST)*   The information contained in this medical record document is considered private and confidential patient information. This information can only be used for the medical diagnosis and/or medical services that are being provided by the patient's selected caregivers. This information can only be distributed outside of the patient's care if the patient agrees and signs waivers of authorization for this information to be sent to an outside source or route.  Add: F/u CT showed stone moved to right mid - ureter and  is visible on scout/KUB. Urine Cx was negative. I discussed with Alejandro Foster and reviewed chart, labs, CT and KUBs.

## 2016-03-28 ENCOUNTER — Encounter (HOSPITAL_BASED_OUTPATIENT_CLINIC_OR_DEPARTMENT_OTHER): Payer: Self-pay | Admitting: Anesthesiology

## 2016-03-28 ENCOUNTER — Ambulatory Visit (HOSPITAL_BASED_OUTPATIENT_CLINIC_OR_DEPARTMENT_OTHER)
Admission: RE | Admit: 2016-03-28 | Discharge: 2016-03-28 | Disposition: A | Discharge status: Home / Self Care | Payer: Medicare Other | Source: Ambulatory Visit | Attending: Urology | Admitting: Urology

## 2016-03-28 ENCOUNTER — Ambulatory Visit (HOSPITAL_COMMUNITY): Payer: Medicare Other

## 2016-03-28 ENCOUNTER — Ambulatory Visit (HOSPITAL_BASED_OUTPATIENT_CLINIC_OR_DEPARTMENT_OTHER): Payer: Medicare Other | Admitting: Anesthesiology

## 2016-03-28 ENCOUNTER — Encounter (HOSPITAL_BASED_OUTPATIENT_CLINIC_OR_DEPARTMENT_OTHER)
Admission: RE | Disposition: A | Discharge status: Home / Self Care | Payer: Self-pay | Source: Ambulatory Visit | Attending: Urology

## 2016-03-28 DIAGNOSIS — E119 Type 2 diabetes mellitus without complications: Secondary | ICD-10-CM | POA: Insufficient documentation

## 2016-03-28 DIAGNOSIS — N3080 Other cystitis without hematuria: Secondary | ICD-10-CM | POA: Insufficient documentation

## 2016-03-28 DIAGNOSIS — M1990 Unspecified osteoarthritis, unspecified site: Secondary | ICD-10-CM | POA: Diagnosis not present

## 2016-03-28 DIAGNOSIS — K219 Gastro-esophageal reflux disease without esophagitis: Secondary | ICD-10-CM | POA: Diagnosis not present

## 2016-03-28 DIAGNOSIS — Z79899 Other long term (current) drug therapy: Secondary | ICD-10-CM | POA: Insufficient documentation

## 2016-03-28 DIAGNOSIS — I10 Essential (primary) hypertension: Secondary | ICD-10-CM | POA: Insufficient documentation

## 2016-03-28 DIAGNOSIS — N202 Calculus of kidney with calculus of ureter: Secondary | ICD-10-CM | POA: Diagnosis present

## 2016-03-28 DIAGNOSIS — I2510 Atherosclerotic heart disease of native coronary artery without angina pectoris: Secondary | ICD-10-CM | POA: Insufficient documentation

## 2016-03-28 DIAGNOSIS — Z87891 Personal history of nicotine dependence: Secondary | ICD-10-CM | POA: Insufficient documentation

## 2016-03-28 DIAGNOSIS — Z888 Allergy status to other drugs, medicaments and biological substances status: Secondary | ICD-10-CM | POA: Diagnosis not present

## 2016-03-28 DIAGNOSIS — E7800 Pure hypercholesterolemia, unspecified: Secondary | ICD-10-CM | POA: Diagnosis not present

## 2016-03-28 DIAGNOSIS — N201 Calculus of ureter: Secondary | ICD-10-CM

## 2016-03-28 DIAGNOSIS — Z87442 Personal history of urinary calculi: Secondary | ICD-10-CM | POA: Insufficient documentation

## 2016-03-28 DIAGNOSIS — Z8042 Family history of malignant neoplasm of prostate: Secondary | ICD-10-CM | POA: Insufficient documentation

## 2016-03-28 DIAGNOSIS — Z794 Long term (current) use of insulin: Secondary | ICD-10-CM | POA: Insufficient documentation

## 2016-03-28 DIAGNOSIS — Z8551 Personal history of malignant neoplasm of bladder: Secondary | ICD-10-CM | POA: Diagnosis not present

## 2016-03-28 DIAGNOSIS — Z8249 Family history of ischemic heart disease and other diseases of the circulatory system: Secondary | ICD-10-CM | POA: Diagnosis not present

## 2016-03-28 HISTORY — DX: Presence of coronary angioplasty implant and graft: Z95.5

## 2016-03-28 HISTORY — PX: CYSTOSCOPY WITH URETEROSCOPY AND STENT PLACEMENT: SHX6377

## 2016-03-28 HISTORY — DX: Radiculopathy, lumbar region: M54.16

## 2016-03-28 HISTORY — DX: Type 2 diabetes mellitus without complications: E11.9

## 2016-03-28 HISTORY — DX: Other intervertebral disc degeneration, lumbar region without mention of lumbar back pain or lower extremity pain: M51.369

## 2016-03-28 HISTORY — DX: Calculus of kidney: N20.0

## 2016-03-28 HISTORY — PX: HOLMIUM LASER APPLICATION: SHX5852

## 2016-03-28 HISTORY — DX: Presence of external hearing-aid: Z97.4

## 2016-03-28 HISTORY — DX: Personal history of urinary calculi: Z87.442

## 2016-03-28 HISTORY — DX: Calculus of ureter: N20.1

## 2016-03-28 HISTORY — DX: Other intervertebral disc degeneration, lumbar region: M51.36

## 2016-03-28 HISTORY — DX: Other symptoms and signs involving the musculoskeletal system: R29.898

## 2016-03-28 HISTORY — DX: Atherosclerotic heart disease of native coronary artery without angina pectoris: I25.10

## 2016-03-28 HISTORY — DX: Polyneuropathy, unspecified: G62.9

## 2016-03-28 LAB — POCT I-STAT, CHEM 8
BUN: 19 mg/dL (ref 6–20)
CALCIUM ION: 1.11 mmol/L — AB (ref 1.15–1.40)
CREATININE: 1 mg/dL (ref 0.61–1.24)
Chloride: 106 mmol/L (ref 101–111)
GLUCOSE: 176 mg/dL — AB (ref 65–99)
HCT: 42 % (ref 39.0–52.0)
HEMOGLOBIN: 14.3 g/dL (ref 13.0–17.0)
Potassium: 4.2 mmol/L (ref 3.5–5.1)
Sodium: 138 mmol/L (ref 135–145)
TCO2: 21 mmol/L (ref 0–100)

## 2016-03-28 LAB — GLUCOSE, CAPILLARY: Glucose-Capillary: 150 mg/dL — ABNORMAL HIGH (ref 65–99)

## 2016-03-28 SURGERY — CYSTOURETEROSCOPY, WITH STENT INSERTION
Anesthesia: General | Site: Ureter | Laterality: Right

## 2016-03-28 MED ORDER — SODIUM CHLORIDE 0.9 % IR SOLN
Status: DC | PRN
  Administered 2016-03-28: 1 via INTRAVESICAL

## 2016-03-28 MED ORDER — KETOROLAC TROMETHAMINE 30 MG/ML IJ SOLN
INTRAMUSCULAR | Status: DC | PRN
  Administered 2016-03-28: 15 mg via INTRAVENOUS

## 2016-03-28 MED ORDER — LIDOCAINE 2% (20 MG/ML) 5 ML SYRINGE
INTRAMUSCULAR | Status: AC
  Filled 2016-03-28: qty 5

## 2016-03-28 MED ORDER — KETOROLAC TROMETHAMINE 30 MG/ML IJ SOLN
INTRAMUSCULAR | Status: AC
  Filled 2016-03-28: qty 1

## 2016-03-28 MED ORDER — FENTANYL CITRATE (PF) 100 MCG/2ML IJ SOLN
25.0000 ug | INTRAMUSCULAR | Status: DC | PRN
  Filled 2016-03-28: qty 1

## 2016-03-28 MED ORDER — LIDOCAINE HCL 2 % EX GEL
CUTANEOUS | Status: DC | PRN
  Administered 2016-03-28: 1 via URETHRAL

## 2016-03-28 MED ORDER — LIDOCAINE 2% (20 MG/ML) 5 ML SYRINGE
INTRAMUSCULAR | Status: DC | PRN
  Administered 2016-03-28: 50 mg via INTRAVENOUS

## 2016-03-28 MED ORDER — ONDANSETRON HCL 4 MG/2ML IJ SOLN
INTRAMUSCULAR | Status: AC
  Filled 2016-03-28: qty 2

## 2016-03-28 MED ORDER — PROPOFOL 10 MG/ML IV BOLUS
INTRAVENOUS | Status: DC | PRN
  Administered 2016-03-28: 130 mg via INTRAVENOUS

## 2016-03-28 MED ORDER — ONDANSETRON HCL 4 MG/2ML IJ SOLN
INTRAMUSCULAR | Status: DC | PRN
  Administered 2016-03-28: 4 mg via INTRAVENOUS

## 2016-03-28 MED ORDER — LACTATED RINGERS IV SOLN
INTRAVENOUS | Status: DC
  Administered 2016-03-28 (×2): via INTRAVENOUS
  Filled 2016-03-28: qty 1000

## 2016-03-28 MED ORDER — IOHEXOL 300 MG/ML  SOLN
INTRAMUSCULAR | Status: DC | PRN
  Administered 2016-03-28: 11 mL via URETHRAL

## 2016-03-28 MED ORDER — STERILE WATER FOR IRRIGATION IR SOLN
Status: DC | PRN
  Administered 2016-03-28: 50 mL

## 2016-03-28 MED ORDER — CEFAZOLIN IN D5W 1 GM/50ML IV SOLN
1.0000 g | INTRAVENOUS | Status: AC
  Administered 2016-03-28: 2 g via INTRAVENOUS
  Filled 2016-03-28: qty 50

## 2016-03-28 MED ORDER — HYDROCODONE-ACETAMINOPHEN 5-325 MG PO TABS
ORAL_TABLET | ORAL | Status: AC
  Filled 2016-03-28: qty 1

## 2016-03-28 MED ORDER — FENTANYL CITRATE (PF) 100 MCG/2ML IJ SOLN
INTRAMUSCULAR | Status: DC | PRN
  Administered 2016-03-28: 25 ug via INTRAVENOUS
  Administered 2016-03-28: 50 ug via INTRAVENOUS
  Administered 2016-03-28: 25 ug via INTRAVENOUS

## 2016-03-28 MED ORDER — LIDOCAINE HCL (CARDIAC) 20 MG/ML IV SOLN: INTRAVENOUS | Status: DC | PRN

## 2016-03-28 MED ORDER — FENTANYL CITRATE (PF) 100 MCG/2ML IJ SOLN
INTRAMUSCULAR | Status: AC
  Filled 2016-03-28: qty 2

## 2016-03-28 MED ORDER — LIDOCAINE HCL 2 % EX GEL
CUTANEOUS | Status: AC
  Filled 2016-03-28: qty 5

## 2016-03-28 MED ORDER — HYDROCODONE-ACETAMINOPHEN 5-325 MG PO TABS
1.0000 | ORAL_TABLET | ORAL | Status: DC | PRN
  Administered 2016-03-28: 1 via ORAL
  Filled 2016-03-28: qty 1

## 2016-03-28 MED ORDER — DEXAMETHASONE SODIUM PHOSPHATE 10 MG/ML IJ SOLN
INTRAMUSCULAR | Status: AC
  Filled 2016-03-28: qty 1

## 2016-03-28 MED ORDER — DEXAMETHASONE SODIUM PHOSPHATE 4 MG/ML IJ SOLN
INTRAMUSCULAR | Status: DC | PRN
  Administered 2016-03-28: 5 mg via INTRAVENOUS

## 2016-03-28 MED ORDER — ARTIFICIAL TEARS OP OINT
TOPICAL_OINTMENT | OPHTHALMIC | Status: AC
  Filled 2016-03-28: qty 3.5

## 2016-03-28 MED ORDER — PROPOFOL 10 MG/ML IV BOLUS
INTRAVENOUS | Status: AC
  Filled 2016-03-28: qty 40

## 2016-03-28 MED ORDER — CEFAZOLIN SODIUM-DEXTROSE 2-4 GM/100ML-% IV SOLN
INTRAVENOUS | Status: AC
  Filled 2016-03-28: qty 100

## 2016-03-28 SURGICAL SUPPLY — 35 items
BAG DRAIN URO-CYSTO SKYTR STRL (DRAIN) ×4 IMPLANT
BASKET LASER NITINOL 1.9FR (BASKET) IMPLANT
BASKET STNLS GEMINI 4WIRE 3FR (BASKET) IMPLANT
BASKET ZERO TIP NITINOL 2.4FR (BASKET) ×4 IMPLANT
CATH INTERMIT  6FR 70CM (CATHETERS) IMPLANT
CATH URET 5FR 28IN CONE TIP (BALLOONS)
CATH URET 5FR 28IN OPEN ENDED (CATHETERS) IMPLANT
CATH URET 5FR 70CM CONE TIP (BALLOONS) IMPLANT
CATH URET DUAL LUMEN 6-10FR 50 (CATHETERS) ×4 IMPLANT
CLOTH BEACON ORANGE TIMEOUT ST (SAFETY) ×4 IMPLANT
DRSG TELFA 3X8 NADH (GAUZE/BANDAGES/DRESSINGS) ×4 IMPLANT
ELECT REM PT RETURN 9FT ADLT (ELECTROSURGICAL)
ELECTRODE REM PT RTRN 9FT ADLT (ELECTROSURGICAL) IMPLANT
FIBER LASER FLEXIVA 200 (UROLOGICAL SUPPLIES) ×4 IMPLANT
GLOVE BIO SURGEON STRL SZ7.5 (GLOVE) ×4 IMPLANT
GOWN STRL REUS W/ TWL LRG LVL3 (GOWN DISPOSABLE) ×2 IMPLANT
GOWN STRL REUS W/ TWL XL LVL3 (GOWN DISPOSABLE) ×2 IMPLANT
GOWN STRL REUS W/TWL LRG LVL3 (GOWN DISPOSABLE) ×2
GOWN STRL REUS W/TWL XL LVL3 (GOWN DISPOSABLE) ×2
GUIDEWIRE 0.038 PTFE COATED (WIRE) ×4 IMPLANT
GUIDEWIRE ANG ZIPWIRE 038X150 (WIRE) ×4 IMPLANT
GUIDEWIRE STR DUAL SENSOR (WIRE) ×4 IMPLANT
IV NS IRRIG 3000ML ARTHROMATIC (IV SOLUTION) ×8 IMPLANT
KIT BALLIN UROMAX 15FX10 (LABEL) IMPLANT
KIT BALLN UROMAX 15FX4 (MISCELLANEOUS) IMPLANT
KIT BALLN UROMAX 26 75X4 (MISCELLANEOUS)
KIT ROOM TURNOVER WOR (KITS) ×4 IMPLANT
MANIFOLD NEPTUNE II (INSTRUMENTS) IMPLANT
PACK CYSTO (CUSTOM PROCEDURE TRAY) ×4 IMPLANT
SET HIGH PRES BAL DIL (LABEL)
SHEATH ACCESS URETERAL 38CM (SHEATH) ×4 IMPLANT
STENT URET 6FRX26 CONTOUR (STENTS) ×12 IMPLANT
SYRINGE IRR TOOMEY STRL 70CC (SYRINGE) IMPLANT
TUBE CONNECTING 12'X1/4 (SUCTIONS) ×1
TUBE CONNECTING 12X1/4 (SUCTIONS) ×3 IMPLANT

## 2016-03-28 NOTE — Interval H&P Note (Signed)
History and Physical Interval Note:  03/28/2016 11:41 AM  Alejandro Foster  has presented today for surgery, with the diagnosis of RIGHT URETERAL STONE  The various methods of treatment have been discussed with the patient and family. After consideration of risks, benefits and other options for treatment (I discussed continued stone passage and ESWL), the patient has consented to  Procedure(s): CYSTOSCOPY WITH RIGHT  URETEROSCOPY AND STENT PLACEMENT (Right) HOLMIUM LASER APPLICATION (Right) as a surgical intervention .  The patient's history has been reviewed, patient examined, no change in status, stable for surgery.  I have reviewed the patient's chart, KUB and labs. Stone remains in right mid-distal - discussed possible staged procedure. Questions were answered to the patient's satisfaction.  He elects to proceed.   Redmond Whittley

## 2016-03-28 NOTE — Progress Notes (Addendum)
Pt took own lopressor pill  w sip of water per Dr. Blane Ohara instruction.

## 2016-03-28 NOTE — Transfer of Care (Signed)
Last Vitals:  Vitals:   03/28/16 1014  BP: (!) 151/61  Pulse: 69  Resp: 16  Temp: 36.4 C    Last Pain:  Vitals:   03/28/16 1014  TempSrc: Oral         Immediate Anesthesia Transfer of Care Note  Patient: [name]  Procedure(s) Performed: Procedure(s) (LRB): CYSTOSCOPY WITH RIGHT  URETEROSCOPY AND STENT PLACEMENT (Right) HOLMIUM LASER APPLICATION (Right)  Patient Location: PACU  Anesthesia Type: General  Level of Consciousness: awake, alert  and oriented  Airway & Oxygen Therapy: Patient Spontanous Breathing and Patient connected to nasal cannula oxygen  Post-op Assessment: Report given to PACU RN and Post -op Vital signs reviewed and stable  Post vital signs: Reviewed and stable  Complications: No apparent anesthesia complications

## 2016-03-28 NOTE — Anesthesia Procedure Notes (Signed)
Procedure Name: LMA Insertion Date/Time: 03/28/2016 11:50 AM Performed by: Suzette Battiest Pre-anesthesia Checklist: Patient identified, Emergency Drugs available, Suction available and Patient being monitored Patient Re-evaluated:Patient Re-evaluated prior to inductionOxygen Delivery Method: Circle system utilized Preoxygenation: Pre-oxygenation with 100% oxygen Intubation Type: IV induction Ventilation: Mask ventilation without difficulty LMA: LMA inserted LMA Size: 4.0 Number of attempts: 1 Airway Equipment and Method: Bite block Placement Confirmation: positive ETCO2 Tube secured with: Tape Dental Injury: Teeth and Oropharynx as per pre-operative assessment

## 2016-03-28 NOTE — Anesthesia Preprocedure Evaluation (Addendum)
Anesthesia Evaluation  Patient identified by MRN, date of birth, ID band Patient awake    Reviewed: Allergy & Precautions, NPO status , Patient's Chart, lab work & pertinent test results, reviewed documented beta blocker date and time   Airway Mallampati: II  TM Distance: >3 FB Neck ROM: Full    Dental  (+) Dental Advisory Given   Pulmonary former smoker,    breath sounds clear to auscultation       Cardiovascular hypertension, Pt. on medications and Pt. on home beta blockers + CAD and + Cardiac Stents   Rhythm:Regular Rate:Normal     Neuro/Psych  Neuromuscular disease    GI/Hepatic negative GI ROS, Neg liver ROS,   Endo/Other  diabetes, Type 2, Insulin Dependent  Renal/GU Renal disease     Musculoskeletal  (+) Arthritis ,   Abdominal   Peds  Hematology negative hematology ROS (+)   Anesthesia Other Findings   Reproductive/Obstetrics                            Lab Results  Component Value Date   WBC 9.7 01/19/2016   HGB 14.2 01/19/2016   HCT 41.4 01/19/2016   MCV 84.1 01/19/2016   PLT 226 01/19/2016   Lab Results  Component Value Date   CREATININE 1.08 01/19/2016   BUN 20 01/19/2016   NA 139 01/19/2016   K 3.9 01/19/2016   CL 108 01/19/2016   CO2 25 01/19/2016    Anesthesia Physical Anesthesia Plan  ASA: III  Anesthesia Plan: General   Post-op Pain Management:    Induction: Intravenous  Airway Management Planned: LMA  Additional Equipment:   Intra-op Plan:   Post-operative Plan: Extubation in OR  Informed Consent: I have reviewed the patients History and Physical, chart, labs and discussed the procedure including the risks, benefits and alternatives for the proposed anesthesia with the patient or authorized representative who has indicated his/her understanding and acceptance.   Dental advisory given  Plan Discussed with: CRNA  Anesthesia Plan Comments:          Anesthesia Quick Evaluation

## 2016-03-28 NOTE — Op Note (Signed)
Preoperative diagnosis: Right ureteral stone, right renal stone Postoperative diagnosis: Same  Procedure: Cystoscopy, right retrograde pyelogram, right ureteroscopy with holmium laser lithotripsy and stone basket extraction, right ureteral stent placement; Bladder biopsy and fulguration 0.5 cm  Surgeon: Junious Silk   anesthesia: Gen.  Indication for procedure: 83 year old with symptomatic right ureteral stone was brought for the above procedures. There is a posterior small area of erythema neovascularity which was biopsied and fulgurated at the posterior bladder.  Findings: On cystoscopy the urethra appeared normal, the prostatic urethra was unremarkable, the trigone ureteral orifices were in their normal orthotopic position with clear efflux. There were no stones or foreign bodies in the bladder. The bladder mucosa appeared normal apart from a small cervical posteriorly about 5 mm which had some distinct neovascularity.  Right retrograde pyelogram-this outlined a single ureter single collecting system unit. There is a filling defect in the right mid to distal ureter just under the iliacs in the location of the stone. The proximal ureter and collecting system were unremarkable.  Ureteroscopy the stone was located in the right distal ureter and a small stone in the right upper pole posterior calyx.  Description of procedure: After consent was obtained patient brought to the operating room. After adequate anesthesia he was placed in lithotomy position and prepped and draped in the usual sterile fashion. A timeout was performed to confirm the patient and procedure. The cystoscope was passed per urethra and the bladder inspected. The right ureteral orifice was cannulated with a 6 [place] open-ended catheter and retrograde injection of contrast was performed. Then I advanced an angled glide wire. The semirigid scope was placed adjacent to the wire where the stones located in the right mid to distal ureter.  It was too large for extraction and it was fragmented 0.2 and 53 and 0.8 and 8 and in the fragments were dropped in the bladder with a 0 tip basket. The ureter was clear well up into the proximal ureter and had placed a second wire under direct vision. Over the second wire a ureteral access sheath was advanced without difficulty. The digital ureteroscope was then placed in the proximal ureter inspection was completed as well as the collecting system was inspected. A small stone was noted in the upper pole was grasped and removed without difficulty with a 0 tip Nitinol basket. Another inspection of the collecting system and renal pelvis noted to be normal. The proximal ureter was normal. The scope and the access sheath were backed out together and the ureter noted to be normal with no other stone fragments or injury.   I then turned my attention to the posterior bladder and biopsied this with the tauber biopsy forceps but the coagulation did not work. Therefore Bugbee electrode was passed and the small biopsy site fulgurated. Hemostasis was insured at low-pressure. Next the Glidewire was backloaded on the cystoscope and a 6 x 26 and ureter stent was advanced. The wire was removed with a good coil seen in the kidney and a good coil in the bladder. The bladder was drained and the scope removed. He was awakened and taken to the recovery room in stable condition.  Complications: None  Blood loss: Minimal  Specimens: #1 Stone fragments to office lab #2 posterior bladder biopsy to pathology  Drains: 6 x 26 cm right ureteral stent with string  Disposition: Patient stable to PACU

## 2016-03-28 NOTE — Anesthesia Postprocedure Evaluation (Signed)
Anesthesia Post Note  Patient: JA DELLAQUILA  Procedure(s) Performed: Procedure(s) (LRB): CYSTOSCOPY WITH RIGHT  URETEROSCOPY AND STENT PLACEMENT (Right) HOLMIUM LASER APPLICATION (Right)  Patient location during evaluation: PACU Anesthesia Type: General Level of consciousness: awake and alert Pain management: pain level controlled Vital Signs Assessment: post-procedure vital signs reviewed and stable Respiratory status: spontaneous breathing, nonlabored ventilation, respiratory function stable and patient connected to nasal cannula oxygen Cardiovascular status: blood pressure returned to baseline and stable Postop Assessment: no signs of nausea or vomiting Anesthetic complications: no       Last Vitals:  Vitals:   03/28/16 1330 03/28/16 1345  BP: 136/66 134/74  Pulse: (!) 55 (!) 56  Resp: 13 13  Temp:      Last Pain:  Vitals:   03/28/16 1300  TempSrc:   PainSc: 0-No pain                 Tiajuana Amass

## 2016-03-28 NOTE — Discharge Instructions (Signed)
Post Anesthesia Home Care Instructions  Activity: Get plenty of rest for the remainder of the day. A responsible adult should stay with you for 24 hours following the procedure.  For the next 24 hours, DO NOT: -Drive a car -Paediatric nurse -Drink alcoholic beverages -Take any medication unless instructed by your physician -Make any legal decisions or sign important papers.  Meals: Start with liquid foods such as gelatin or soup. Progress to regular foods as tolerated. Avoid greasy, spicy, heavy foods. If nausea and/or vomiting occur, drink only clear liquids until the nausea and/or vomiting subsides. Call your physician if vomiting continues.  Special Instructions/Symptoms: Your throat may feel dry or sore from the anesthesia or the breathing tube placed in your throat during surgery. If this causes discomfort, gargle with warm salt water. The discomfort should disappear within 24 hours.  If you had a scopolamine patch placed behind your ear for the management of post- operative nausea and/or vomiting:  1. The medication in the patch is effective for 72 hours, after which it should be removed.  Wrap patch in a tissue and discard in the trash. Wash hands thoroughly with soap and water. 2. You may remove the patch earlier than 72 hours if you experience unpleasant side effects which may include dry mouth, dizziness or visual disturbances. 3. Avoid touching the patch. Wash your hands with soap and water after contact with the patch.   Ureteral Stent Implantation, Care After Introduction Refer to this sheet in the next few weeks. These instructions provide you with information about caring for yourself after your procedure. Your health care provider may also give you more specific instructions. Your treatment has been planned according to current medical practices, but problems sometimes occur. Call your health care provider if you have any problems or questions after your  procedure.  REMOVAL OF THE STENT: remove the stent by pulling the string Friday morning, March 31, 2016.   What can I expect after the procedure? After the procedure, it is common to have:  Nausea.  Mild pain when you urinate. You may feel this pain in your lower back or lower abdomen. Pain should stop within a few minutes after you urinate. This may last for up to 1 week.  A small amount of blood in your urine for several days. Follow these instructions at home:   Medicines  Take over-the-counter and prescription medicines only as told by your health care provider.  If you were prescribed an antibiotic medicine, take it as told by your health care provider. Do not stop taking the antibiotic even if you start to feel better.  Do not drive for 24 hours if you received a sedative.  Do not drive or operate heavy machinery while taking prescription pain medicines. Activity  Return to your normal activities as told by your health care provider. Ask your health care provider what activities are safe for you.  Do not lift anything that is heavier than 10 lb (4.5 kg). Follow this limit for 1 week after your procedure, or for as long as told by your health care provider. General instructions  Watch for any blood in your urine. Call your health care provider if the amount of blood in your urine increases.  If you have a catheter:  Follow instructions from your health care provider about taking care of your catheter and collection bag.  Do not take baths, swim, or use a hot tub until your health care provider approves.  Drink enough fluid  to keep your urine clear or pale yellow.  Keep all follow-up visits as told by your health care provider. This is important. Contact a health care provider if:  You have pain that gets worse or does not get better with medicine, especially pain when you urinate.  You have difficulty urinating.  You feel nauseous or you vomit repeatedly during a  period of more than 2 days after the procedure. Get help right away if:  Your urine is dark red or has blood clots in it.  You are leaking urine (have incontinence).  The end of the stent comes out of your urethra.  You cannot urinate.  You have sudden, sharp, or severe pain in your abdomen or lower back.  You have a fever. This information is not intended to replace advice given to you by your health care provider. Make sure you discuss any questions you have with your health care provider. Document Released: 11/06/2012 Document Revised: 08/12/2015 Document Reviewed: 09/18/2014  2017 [employer]

## 2016-03-29 ENCOUNTER — Encounter (HOSPITAL_BASED_OUTPATIENT_CLINIC_OR_DEPARTMENT_OTHER): Payer: Self-pay | Admitting: Urology

## 2016-09-12 DIAGNOSIS — R5383 Other fatigue: Secondary | ICD-10-CM | POA: Insufficient documentation

## 2017-01-09 DIAGNOSIS — R269 Unspecified abnormalities of gait and mobility: Secondary | ICD-10-CM | POA: Insufficient documentation

## 2017-01-11 ENCOUNTER — Encounter (INDEPENDENT_AMBULATORY_CARE_PROVIDER_SITE_OTHER): Payer: Self-pay | Admitting: Physical Medicine and Rehabilitation

## 2017-01-11 ENCOUNTER — Ambulatory Visit (INDEPENDENT_AMBULATORY_CARE_PROVIDER_SITE_OTHER): Payer: Medicare Other | Admitting: Physical Medicine and Rehabilitation

## 2017-01-11 VITALS — BP 148/81 | HR 65

## 2017-01-11 DIAGNOSIS — M419 Scoliosis, unspecified: Secondary | ICD-10-CM | POA: Diagnosis not present

## 2017-01-11 DIAGNOSIS — M5416 Radiculopathy, lumbar region: Secondary | ICD-10-CM

## 2017-01-11 NOTE — Progress Notes (Deleted)
Lower back pain right sided radiating down leg to knee. No numbness or tingling. Doesn't know if last injection really helped because he had kidney stones 2 days after. Felt better for first 2 days after injection.

## 2017-01-12 ENCOUNTER — Encounter (INDEPENDENT_AMBULATORY_CARE_PROVIDER_SITE_OTHER): Payer: Self-pay | Admitting: Physical Medicine and Rehabilitation

## 2017-01-12 NOTE — Progress Notes (Signed)
Alejandro Foster - 83 y.o. male MRN [id number]  Date of birth: [birth year]  Office Visit Note: Visit Date: 01/11/2017 PCP: Shon Baton, MD Referred by: Shon Baton, MD  Subjective: Chief Complaint  Patient presents with  . Lower Back - Pain   HPI: Alejandro Foster is an 83 year old followed by Dr. Sharol Given.  He comes in today after having had a right L5 transforaminal injection in October of last year.  He felt like it helped for a couple of days but he unfortunately had a pretty severe kidney stone at the same time a few days later and was hospitalized.  He actually had quite a bit of complications over the next several months because of the kidney stone.  He comes in today stating that he continues to have low back pain with referral pattern into the lateral buttock and lateral leg to about the knee.  He does not endorse any numbness or tingling in the upper thigh but he does have a history of bilateral polyneuropathy of the lower limbs.  He does have type 2 diabetes.  His MRI from last year is low.  His moderate right scoliosis centered at L2 with a very minor listhesis of L4 on L5 with significant facet arthropathy on the right at L4-5.  There is degenerative disc height loss at this level as well.  There is no central canal stenosis and no focal nerve compression.  He reports no real pain on the left side.  Most of his pain is with standing and ambulating.  Better at rest.  Medications have not been beneficial.  He does have a history of heart disease and cannot anti-inflammatories.  He does use tramadol and gabapentin with only mild relief of the right-sided hip and leg pain.  He denies any groin pain or specific trauma.    Review of Systems  Constitutional: Negative for chills, fever, malaise/fatigue and weight loss.  HENT: Negative for hearing loss and sinus pain.   Eyes: Negative for blurred vision, double vision and photophobia.  Respiratory: Negative for cough and shortness of breath.     Cardiovascular: Negative for chest pain, palpitations and leg swelling.  Gastrointestinal: Negative for abdominal pain, nausea and vomiting.  Genitourinary: Negative for flank pain.  Musculoskeletal: Positive for back pain. Negative for myalgias.       Right hip and leg pain  Skin: Negative for itching and rash.  Neurological: Positive for tingling. Negative for tremors, focal weakness and weakness.  Endo/Heme/Allergies: Negative.   Psychiatric/Behavioral: Negative for depression.  All other systems reviewed and are negative.  Otherwise per HPI.  Assessment & Plan: Visit Diagnoses:  1. Lumbar radiculopathy   2. Scoliosis of thoracolumbar spine, unspecified scoliosis type     Plan: Findings:  Chronic long history of intermittent back pain but 1 year history of right hip and leg pain to about the knee.  His referral pattern clearly could be L5 or L4 nerve root.  This could also be facet mediated referral pattern.  Does have a right-sided facet joint arthropathy particularly at L4-5.  He also has some foraminal narrowing and slight foraminal protrusion is both at L4 and L5.  He has had conservative care with medications and therapy in the past.  Course is complicated by significant type 2 diabetes with polyneuropathy.  He also has a history of heart catheterization.Marland Kitchen  Approach is to complete a right L4 and L5 transforaminal epidural steroid injection to see if that is very beneficial probably go ahead  and schedule him for potential L4-5 and L5-S1 facet joint block depending on those results.  He will continue on the gabapentin and tramadol.    Meds & Orders: No orders of the defined types were placed in this encounter.  No orders of the defined types were placed in this encounter.   Follow-up: Return for Right L4 and L5 transforaminal epidural steroid injection.   Procedures: No procedures performed  No notes on file   Clinical History: MRI LUMBAR SPINE WITHOUT  CONTRAST  TECHNIQUE: Multiplanar, multisequence MR imaging of the lumbar spine was performed. No intravenous contrast was administered.  COMPARISON:  Office radiographs 07/15/2015.  FINDINGS: Segmentation: Conventional anatomy assumed, with the last open disc space designated L5-S1.  Alignment: There is a moderate convex right scoliosis centered at L2. There is 2 mm of degenerative anterolisthesis at L4-5.  Vertebrae: No worrisome osseous lesion, acute fracture or pars defect. The visualized sacroiliac joints appear unremarkable.  Conus medullaris: Extends to the L1-2 level and appears normal.  Paraspinal and other soft tissues: No significant paraspinal findings. A left renal cyst is partially imaged.  Disc levels:  T11-12: Mild loss of disc height and annular disc bulging. No spinal stenosis or nerve root encroachment.  There are no significant disc space findings at T12-L1 or L1-2.  L2-3: Mild disc bulging, facet and ligamentous hypertrophy. No spinal stenosis or nerve root encroachment.  L3-4: Annular disc bulging, facet and ligamentous hypertrophy contribute to mild spinal stenosis and mild narrowing of the lateral recesses, left greater than right. The foramina are sufficiently patent.  L4-5: There is loss of disc height with annular disc bulging and endplate osteophytes asymmetric to the right. There is also asymmetric right-sided facet hypertrophy. These factors contribute to mild-to-moderate right foraminal and mild right lateral recess narrowing. There is no definite nerve root encroachment. The spinal canal is adequately patent.  L5-S1: Fairly symmetric disc bulging with asymmetric facet hypertrophy on the right. Mild right foraminal narrowing. No definite nerve root encroachment.  IMPRESSION: 1. Convex right lumbar scoliosis with associated multilevel spondylosis. No acute findings demonstrated. 2. Mild multifactorial spinal stenosis at  L3-4 with mild narrowing of the lateral recesses, left greater than right. 3. Mild-to-moderate right foraminal and mild right lateral recess narrowing at L4-5 without definite nerve root encroachment. 4. Mild right foraminal narrowing at L5-S1.   Electronically Signed   By: Richardean Sale M.D.   On: 08/22/2015 15:41  He reports that he quit smoking about 28 years ago. His smoking use included Cigarettes. He quit after 40.00 years of use. He has never used smokeless tobacco. No results for input(s): HGBA1C, LABURIC in the last 8760 hours.  Objective:  VS:  HT:    WT:   BMI:     BP:(!) 148/81  HR:65bpm  TEMP: ( )  RESP:  Physical Exam  Constitutional: He is oriented to person, place, and time. He appears well-developed and well-nourished. No distress.  HENT:  Head: Normocephalic and atraumatic.  Nose: Nose normal.  Mouth/Throat: Oropharynx is clear and moist.  Eyes: Pupils are equal, round, and reactive to light. Conjunctivae are normal.  Neck: Normal range of motion. Neck supple.  Cardiovascular: Regular rhythm and intact distal pulses.   Pulmonary/Chest: Effort normal and breath sounds normal.  Abdominal: Soft. He exhibits no distension.  Musculoskeletal: He exhibits no deformity.  Patient ambulates without aid.  He is somewhat slow to rise from a seated position.  This is mainly due to his knees.  He does  have pain with extension of the lumbar spine.  He has no paraspinal tenderness.  He has no pain over the greater trochanters.  He is a little bit tender along the iliotibial band on the right but not the greater trochanter.  He has no pain with hip rotation.  Does have some stiffness at inches.  He has good distal strength without clonus.  Neurological: He is alert and oriented to person, place, and time. He exhibits normal muscle tone. Coordination normal.  Skin: Skin is warm. No rash noted.  Psychiatric: He has a normal mood and affect. His behavior is normal.  Nursing note  and vitals reviewed.   Ortho Exam Imaging: No results found.  Past Medical/Family/Surgical/Social History: Medications & Allergies reviewed per EMR There are no active problems to display for this patient.  Past Medical History:  Diagnosis Date  . Coronary artery disease    cardiologist-  dr Wynonia Lawman--  s/p  cardiac cath's w/ angioplasty and stenting x2  . DDD (degenerative disc disease), lumbar   . History of kidney stones   . Hyperlipidemia   . Hypertension   . Lumbar radiculopathy, right    right leg weakness  . Nephrolithiasis    bilateral non-obstructive per ct 01-19-2016  . Peripheral neuropathy    feet  . Right leg weakness    due to lumbar radiculopathy  . Right ureteral stone   . S/P coronary artery stent placement    2002 x1  and 2003 x1  . Type 2 diabetes mellitus ([place])   . Wears hearing aid    BILATERAL   History reviewed. No pertinent family history. Past Surgical History:  Procedure Laterality Date  . CARDIAC CATHETERIZATION  01/ 2002   dr Wynonia Lawman   mLAD 30%,  CFX OM3 40%,  RCA 30% previous stent site  . CARDIOVASCULAR STRESS TEST  04-15-2009  dr Wynonia Lawman   normal nuclear study w/ no ischemia/  normal LV function and wall motion , ef 77%  . CATARACT EXTRACTION W/ INTRAOCULAR LENS  IMPLANT, BILATERAL  2013  approx.  . COLONOSCOPY WITH PROPOFOL  2014  . CORONARY ANGIOPLASTY  1990  dr Wynonia Lawman   PTCA to OM2  . CORONARY ANGIOPLASTY  10/ 1996  dr Wynonia Lawman   PTCA to RCA  . CORONARY ANGIOPLASTY WITH STENT PLACEMENT  04/ 1996  dr Wynonia Lawman   stenting to RCA  . CYSTOSCOPY WITH URETEROSCOPY AND STENT PLACEMENT Right 03/28/2016   Procedure: CYSTOSCOPY WITH RIGHT  URETEROSCOPY AND STENT PLACEMENT;  Surgeon: Festus Aloe, MD;  Location: [facility];  Service: Urology;  Laterality: Right;  . EXTRACORPOREAL SHOCK WAVE LITHOTRIPSY  yrs ago  . HOLMIUM LASER APPLICATION Right 11/25/3380   Procedure: HOLMIUM LASER APPLICATION;  Surgeon: Festus Aloe, MD;   Location: [facility];  Service: Urology;  Laterality: Right;  . LAPAROSCOPIC CHOLECYSTECTOMY  1990's  . TONSILLECTOMY AND ADENOIDECTOMY  child  . URETEROLITHOTOMY  1990's   Social History   Occupational History  . Not on file.   Social History Main Topics  . Smoking status: Former Smoker    Years: 40.00    Types: Cigarettes    Quit date: 08/26/1988  . Smokeless tobacco: Never Used  . Alcohol use Yes     Comment: rare  . Drug use: No  . Sexual activity: Not on file

## 2017-01-22 ENCOUNTER — Ambulatory Visit (INDEPENDENT_AMBULATORY_CARE_PROVIDER_SITE_OTHER): Payer: Medicare Other | Admitting: Physical Medicine and Rehabilitation

## 2017-01-22 ENCOUNTER — Encounter (INDEPENDENT_AMBULATORY_CARE_PROVIDER_SITE_OTHER): Payer: Self-pay | Admitting: Physical Medicine and Rehabilitation

## 2017-01-22 ENCOUNTER — Ambulatory Visit: Payer: Medicare Other | Admitting: Physical Therapy

## 2017-01-22 ENCOUNTER — Ambulatory Visit (INDEPENDENT_AMBULATORY_CARE_PROVIDER_SITE_OTHER): Payer: Medicare Other

## 2017-01-22 VITALS — BP 141/68 | HR 88

## 2017-01-22 DIAGNOSIS — M5416 Radiculopathy, lumbar region: Secondary | ICD-10-CM

## 2017-01-22 DIAGNOSIS — M419 Scoliosis, unspecified: Secondary | ICD-10-CM

## 2017-01-22 MED ORDER — BETAMETHASONE SOD PHOS & ACET 6 (3-3) MG/ML IJ SUSP
12.0000 mg | Freq: Once | INTRAMUSCULAR | Status: AC
  Administered 2017-01-22: 12 mg

## 2017-01-22 MED ORDER — LIDOCAINE HCL (PF) 1 % IJ SOLN
2.0000 mL | Freq: Once | INTRAMUSCULAR | Status: AC
  Administered 2017-01-22: 2 mL

## 2017-01-22 NOTE — Progress Notes (Deleted)
Complains of low back pain with radicular right leg pain. Does state that he has been having some numbness and tingling in his right leg from time to time.

## 2017-01-22 NOTE — Patient Instructions (Signed)

## 2017-01-22 NOTE — Procedures (Signed)
[name] is an 83 year old gentleman with a right hip and leg pain.  We saw him recently for evaluation and management.  Please see our prior evaluation and management note for further details and justification.  Lumbosacral Transforaminal Epidural Steroid Injection - Sub-Pedicular Approach with Fluoroscopic Guidance  Patient: Alejandro Foster      Date of Birth: 1934/03/08 MRN: [id number] PCP: Shon Baton, MD      Visit Date: 01/22/2017   Universal Protocol:    Date/Time: 01/22/2017  Consent Given By: the patient  Position: PRONE  Additional Comments: Vital signs were monitored before and after the procedure. Patient was prepped and draped in the usual sterile fashion. The correct patient, procedure, and site was verified.   Injection Procedure Details:  Procedure Site One Meds Administered:  Meds ordered this encounter  Medications  . lidocaine (PF) (XYLOCAINE) 1 % injection 2 mL  . betamethasone acetate-betamethasone sodium phosphate (CELESTONE) injection 12 mg    Laterality: Right  Location/Site:  L4-L5 L5-S1  Needle size: 22 G  Needle type: Spinal  Needle Placement: Transforaminal  Findings:  -Contrast Used: 1 mL iohexol 180 mg iodine/mL   -Comments: Excellent flow of contrast along the nerve and into the epidural space.  Procedure Details: After squaring off the end-plates to get a true AP view, the C-arm was positioned so that an oblique view of the foramen as noted above was visualized. The target area is just inferior to the "nose of the scotty dog" or sub pedicular. The soft tissues overlying this structure were infiltrated with 2-3 ml. of 1% Lidocaine without Epinephrine.  The spinal needle was inserted toward the target using a "trajectory" view along the fluoroscope beam.  Under AP and lateral visualization, the needle was advanced so it did not puncture dura and was located close the 6 O'Clock position of the pedical in AP tracterory. Biplanar projections  were used to confirm position. Aspiration was confirmed to be negative for CSF and/or blood. A 1-2 ml. volume of Isovue-250 was injected and flow of contrast was noted at each level. Radiographs were obtained for documentation purposes.   After attaining the desired flow of contrast documented above, a 0.5 to 1.0 ml test dose of 0.25% Marcaine was injected into each respective transforaminal space.  The patient was observed for 90 seconds post injection.  After no sensory deficits were reported, and normal lower extremity motor function was noted,   the above injectate was administered so that equal amounts of the injectate were placed at each foramen (level) into the transforaminal epidural space.   Additional Comments:  The patient tolerated the procedure well Dressing: Band-Aid    Post-procedure details: Patient was observed during the procedure. Post-procedure instructions were reviewed.  Patient left the clinic in stable condition.

## 2017-01-24 ENCOUNTER — Encounter: Payer: Self-pay | Admitting: Physical Therapy

## 2017-01-24 ENCOUNTER — Ambulatory Visit: Payer: Medicare Other | Attending: Internal Medicine | Admitting: Physical Therapy

## 2017-01-24 DIAGNOSIS — R2689 Other abnormalities of gait and mobility: Secondary | ICD-10-CM | POA: Insufficient documentation

## 2017-01-24 DIAGNOSIS — R2681 Unsteadiness on feet: Secondary | ICD-10-CM | POA: Insufficient documentation

## 2017-01-25 NOTE — Therapy (Signed)
[facility] [address] [place], [place], [address] Phone: [phone]   Fax:  [phone]  Physical Therapy Evaluation  Patient Details  Name: Alejandro Foster MRN: [id number] Date of Birth: 1933/06/26 Referring Provider: Hadassah Pais   Encounter Date: 01/24/2017  PT End of Session - 01/25/17 0920    Visit Number  1    Number of Visits  9    Date for PT Re-Evaluation  03/25/17    Authorization Type  [employer] Medicare-GCODE every 10th visit    PT Start Time  0935    PT Stop Time  1015    PT Time Calculation (min)  40 min    Activity Tolerance  Patient tolerated treatment well    Behavior During Therapy  [facility] for tasks assessed/performed       Past Medical History:  Diagnosis Date  . Coronary artery disease    cardiologist-  dr Wynonia Lawman--  s/p  cardiac cath's w/ angioplasty and stenting x2  . DDD (degenerative disc disease), lumbar   . History of kidney stones   . Hyperlipidemia   . Hypertension   . Lumbar radiculopathy, right    right leg weakness  . Nephrolithiasis    bilateral non-obstructive per ct 01-19-2016  . Peripheral neuropathy    feet  . Right leg weakness    due to lumbar radiculopathy  . Right ureteral stone   . S/P coronary artery stent placement    2002 x1  and 2003 x1  . Type 2 diabetes mellitus ([facility])   . Wears hearing aid    BILATERAL    Past Surgical History:  Procedure Laterality Date  . CARDIAC CATHETERIZATION  01/ 2002   dr Wynonia Lawman   mLAD 30%,  CFX OM3 40%,  RCA 30% previous stent site  . CARDIOVASCULAR STRESS TEST  04-15-2009  dr Wynonia Lawman   normal nuclear study w/ no ischemia/  normal LV function and wall motion , ef 77%  . CATARACT EXTRACTION W/ INTRAOCULAR LENS  IMPLANT, BILATERAL  2013  approx.  . COLONOSCOPY WITH PROPOFOL  2014  . CORONARY ANGIOPLASTY  1990  dr Wynonia Lawman   PTCA to OM2  . CORONARY ANGIOPLASTY  10/ 1996  dr Wynonia Lawman   PTCA to RCA  . CORONARY ANGIOPLASTY WITH STENT PLACEMENT   04/ 1996  dr Wynonia Lawman   stenting to RCA  . EXTRACORPOREAL SHOCK WAVE LITHOTRIPSY  yrs ago  . LAPAROSCOPIC CHOLECYSTECTOMY  1990's  . TONSILLECTOMY AND ADENOIDECTOMY  child  . URETEROLITHOTOMY  1990's    There were no vitals filed for this visit.   Subjective Assessment - 01/24/17 0939    Subjective  Pt had recent visit with MD and told him that balance has been a problem for about a year.  He reports 2 falls coming down steps at home; reports decreased confidence in balance.  He's also noticing dropping things more often.  He uses walking stick when walking outdoors and walking the dog.    Pertinent History  lumbar radiculopathy, scoliosis (s/p injections 01/22/17); neuropathy    Patient Stated Goals  Pt's goal for therapy is to improve balance and strength in legs.    Currently in Pain?  No/denies         [facility] PT Assessment - 01/24/17 0943      Assessment   Medical Diagnosis  gait imbalance    Referring Provider  Hadassah Pais    Onset Date/Surgical Date  01/10/17 referral from MD  referral from MD     Precautions   Precautions  Fall      Balance Screen   Has the patient fallen in the past 6 months  Yes    How many times?  2 both falls down the steps going outdoors at home   both falls down the steps going outdoors at home   Has the patient had a decrease in activity level because of a fear of falling?   Yes    Is the patient reluctant to leave their home because of a fear of falling?   No      Home Film/video editor residence    Living Arrangements  Spouse/significant other    Available Help at Discharge  Family    Type of [place] to enter    Entrance Stairs-Number of Steps  7    Entrance Stairs-Rails  Right    Home Layout  Two level;Bed/bath upstairs    Alternate Level Stairs-Rails  Right    Home Equipment  -- walking stick   walking stick     Prior Function   Level of Independence  Independent    Leisure  Enjoy  hiking and walking, walks the dog; used to go the gym but has not been in past year      Observation/Other Assessments   Focus on Therapeutic Outcomes (FOTO)   NA      Posture/Postural Control   Posture/Postural Control  Postural limitations    Postural Limitations  Rounded Shoulders;Forward head      ROM / Strength   AROM / PROM / Strength  Strength      Strength   Overall Strength  Deficits    Overall Strength Comments  Pt feels RLE is weaker than LLE    Strength Assessment Site  Hip;Knee;Ankle    Right/Left Hip  Right;Left    Right Hip Flexion  5/5    Left Hip Flexion  5/5    Right/Left Knee  Right;Left    Right Knee Flexion  4+/5    Right Knee Extension  4+/5    Left Knee Flexion  5/5    Left Knee Extension  5/5    Right/Left Ankle  Right;Left    Right Ankle Dorsiflexion  4/5    Left Ankle Dorsiflexion  4/5      Transfers   Transfers  Sit to Stand;Stand to Sit    Sit to Stand  6: Modified independent (Device/Increase time);With upper extremity assist;From chair/3-in-1    Stand to Sit  6: Modified independent (Device/Increase time);With upper extremity assist;To chair/3-in-1      Ambulation/Gait   Ambulation/Gait  Yes    Ambulation/Gait Assistance  7: Independent    Ambulation Distance (Feet)  200 Feet    Assistive device  None    Gait Pattern  Step-through pattern;Decreased arm swing - right;Decreased arm swing - left;Decreased step length - right;Decreased step length - left;Poor foot clearance - left;Poor foot clearance - right    Ambulation Surface  Level;Indoor    Gait velocity  10.25 sec = 3.2 ft/sec    Stairs  Yes    Stairs Assistance  6: Modified independent (Device/Increase time)    Stair Management Technique  Two rails;Alternating pattern poor foot placement/clearance   poor foot placement/clearance   Number of Stairs  4    Height of Stairs  6      Standardized Balance Assessment  Standardized Balance Assessment  Timed Up and Go Test;Dynamic Gait Index       Dynamic Gait Index   Level Surface  Normal    Change in Gait Speed  Mild Impairment    Gait with Horizontal Head Turns  Mild Impairment    Gait with Vertical Head Turns  Mild Impairment    Gait and Pivot Turn  Normal    Step Over Obstacle  Mild Impairment    Step Around Obstacles  Normal    Steps  Moderate Impairment    Total Score  18    DGI comment:  Scores <19/24 indicate increased fall risk.      Timed Up and Go Test   Normal TUG (seconds)  14.97    TUG Comments  Scores >13.5 seconds indicate increased fall risk.      High Level Balance   High Level Balance Comments  Pt stands EO/EC solid surface x 30 seconds; stand EO/EC on foam surface x 30 seconds (increased posterior sway with EC on foam)             Objective measurements completed on examination: See above findings.              PT Education - 01/25/17 [phone]    Education provided  Yes    Education Details  Discussed eval results, including pt being at fall risk per TUG and DGI scores; discussed role of OT (based on pt's reports of hand pain at times and dropping things more often); discussed safety with stair negotiation and initiated education on fall prevention    Person(s) Educated  Patient    Methods  Explanation    Comprehension  Verbalized understanding          PT Long Term Goals - 01/25/17 0929      PT LONG TERM GOAL #1   Title  Pt will be independent with HEP for improved balance, strength, gait.  TARGET:  01/24/17    Time  4    Period  Weeks    Status  New    Target Date  01/24/17      PT LONG TERM GOAL #2   Title  Pt will improve DGI score to at least 20/24 for decreased fall risk.    Time  4    Period  Weeks    Status  New    Target Date  01/24/17      PT LONG TERM GOAL #3   Title  Pt will improve TUG score to less than or equal to 13.5 seconds for decreased fall risk.    Time  4    Period  Weeks    Status  New    Target Date  01/24/17      PT LONG TERM GOAL #4    Title  Sensory Organization test to be performed, with goals to be written as appropriate.    Time  4    Period  Weeks    Status  New    Target Date  01/24/17      PT LONG TERM GOAL #5   Title  Pt will negotiate at least 4 steps with handrail, alternating step pattern, modified independently for improved safety with stair negotiation.    Time  4    Period  Weeks    Status  New    Target Date  01/24/17      Additional Long Term Goals   Additional Long Term Goals  Yes  PT LONG TERM GOAL #6   Title  Pt will verbalize understanding of fall prevention in home environment.    Time  4    Period  Weeks    Status  New    Target Date  01/24/17             Plan - 03-Feb-2017 5176    Clinical Impression Statement  Pt is an 83 year old male who presents to OP PT with recent history of 2 falls in the past 6 months, referred for gait imbalance.  Pt presents with decreased balance, decreased muscle strength, abnormal gait, possible decreased vestibular system use for balance.  Pt is at fall risk per DGI and TUG scores.  Pt has been active in the past, hiking on trails and walking outdoors, but is not participating in those activities as much recently.  Pt will benefit from skilled PT to address the above stated deficits for decreased fall risk and improved functional mobility/participation in outdoor/leisure activities.    History and Personal Factors relevant to plan of care:  >3 co-morbidities, including DDD, lumbar radiculopathy, DM, peripheral neuropathy    Clinical Presentation  Stable    Clinical Presentation due to:  Fall risk, hx of 2 falls    Clinical Decision Making  Low    Rehab Potential  Good    PT Frequency  2x / week    PT Duration  4 weeks plus eval   plus eval   PT Treatment/Interventions  ADLs/Self Care Home Management;DME Instruction;Balance training;Therapeutic exercise;Stair training;Gait training;Functional mobility training;Therapeutic activities;Neuromuscular  re-education;Patient/family education    PT Next Visit Plan  Perform Sensory Organization test (and write goal as appropriate); initiate HEP for balance    Recommended Other Services  Possible OT referral based on pt's reports of hand pain/dropping things    Consulted and Agree with Plan of Care  Patient       Patient will benefit from skilled therapeutic intervention in order to improve the following deficits and impairments:  Abnormal gait, Decreased balance, Difficulty walking, Decreased strength, Postural dysfunction, Decreased mobility  Visit Diagnosis: Other abnormalities of gait and mobility  Unsteadiness on feet  G-Codes - February 03, 2017 1251    Functional Assessment Tool Used (Outpatient Only)  gait velocity 3.2 ft/sec, DGI 18/24, TUG 14.97 sec, 2 falls in the past 6 months    Functional Limitation  Mobility: Walking and moving around    Mobility: Walking and Moving Around Current Status [phone])  At least 20 percent but less than 40 percent impaired, limited or restricted    Mobility: Walking and Moving Around Goal Status [phone])  At least 1 percent but less than 20 percent impaired, limited or restricted        Problem List There are no active problems to display for this patient.   Whit Bruni W. 02-03-17, 1:01 PM  Frazier Butt., PT  [facility] [address] [place], [place], [address] Phone: [phone]   Fax:  [phone]  Name: Alejandro Foster MRN: [id number] Date of Birth: [birth year]

## 2017-01-29 ENCOUNTER — Ambulatory Visit: Payer: Medicare Other | Admitting: Physical Therapy

## 2017-01-29 DIAGNOSIS — R2681 Unsteadiness on feet: Secondary | ICD-10-CM

## 2017-01-29 DIAGNOSIS — R2689 Other abnormalities of gait and mobility: Secondary | ICD-10-CM

## 2017-01-29 NOTE — Therapy (Signed)
[facility] [address] [place], [place], [address] Phone: [phone]   Fax:  [phone]  Physical Therapy Treatment  Patient Details  Name: Alejandro Foster MRN: [id number] Date of Birth: [birth year] Referring Provider: Hadassah Pais   Encounter Date: 01/29/2017  PT End of Session - 01/29/17 1038    Visit Number  2    Number of Visits  9    Date for PT Re-Evaluation  03/25/17    Authorization Type  [employer]HC Medicare-GCODE every 10th visit    PT Start Time  0931    PT Stop Time  1015    PT Time Calculation (min)  44 min    Equipment Utilized During Treatment  Gait belt    Activity Tolerance  Patient tolerated treatment well    Behavior During Therapy  [facility] for tasks assessed/performed       Past Medical History:  Diagnosis Date  . Coronary artery disease    cardiologist-  dr Wynonia Lawman--  s/p  cardiac cath's w/ angioplasty and stenting x2  . DDD (degenerative disc disease), lumbar   . History of kidney stones   . Hyperlipidemia   . Hypertension   . Lumbar radiculopathy, right    right leg weakness  . Nephrolithiasis    bilateral non-obstructive per ct 01-19-2016  . Peripheral neuropathy    feet  . Right leg weakness    due to lumbar radiculopathy  . Right ureteral stone   . S/P coronary artery stent placement    2002 x1  and 2003 x1  . Type 2 diabetes mellitus ([place])   . Wears hearing aid    BILATERAL    Past Surgical History:  Procedure Laterality Date  . CARDIAC CATHETERIZATION  01/ 2002   dr Wynonia Lawman   mLAD 30%,  CFX OM3 40%,  RCA 30% previous stent site  . CARDIOVASCULAR STRESS TEST  04-15-2009  dr Wynonia Lawman   normal nuclear study w/ no ischemia/  normal LV function and wall motion , ef 77%  . CATARACT EXTRACTION W/ INTRAOCULAR LENS  IMPLANT, BILATERAL  2013  approx.  . COLONOSCOPY WITH PROPOFOL  2014  . CORONARY ANGIOPLASTY  1990  dr Wynonia Lawman   PTCA to OM2  . CORONARY ANGIOPLASTY  10/ 1996  dr Wynonia Lawman   PTCA to  RCA  . CORONARY ANGIOPLASTY WITH STENT PLACEMENT  04/ 1996  dr Wynonia Lawman   stenting to RCA  . EXTRACORPOREAL SHOCK WAVE LITHOTRIPSY  yrs ago  . LAPAROSCOPIC CHOLECYSTECTOMY  1990's  . TONSILLECTOMY AND ADENOIDECTOMY  child  . URETEROLITHOTOMY  1990's    There were no vitals filed for this visit.  Subjective Assessment - 01/29/17 0933    Subjective  Pt states he does not have much back pain and feels his balance has improved since getting injection from doctor. Pt complains of left foot occasionally catching when descending stairs.    Pertinent History  lumbar radiculopathy, scoliosis (s/p injections 01/22/17); neuropathy    Patient Stated Goals  Pt's goal for therapy is to improve balance and strength in legs.    Currently in Pain?  Yes    Pain Score  1     Pain Location  Back    Pain Orientation  Lower    Pain Descriptors / Indicators  Dull    Pain Type  Chronic pain    Multiple Pain Sites  No         [facility] PT Assessment - 01/29/17 0001  Balance   Balance Assessed  Yes       Neuro re-ed: sensory organization test performed with following results: Conditions: 1: all above norm 2: all above norm 3: all above norm  4: 1/3 below norm - EO with floor sway 5: all above norm 6: all above norm Composite score: 76 Sensory Analysis Som: above norm Vis: above norm Vest: above norm Pref: above norm Strategy analysis: within range        COG alignment: within range  .    [facility] Adult PT Treatment/Exercise - 01/29/17 0001      Ambulation/Gait   Stairs  Yes    Stairs Assistance  5: Supervision    Stair Management Technique  No rails;One rail Right;Alternating pattern;Step to pattern;Forwards    Number of Stairs  4 x5 sets, VC's for proper technique and S for safety    Height of Stairs  6      Neuro Re-ed    Neuro Re-ed Details   See HEP in pt instruction. Pt performed corner balance exercises with Min guard/Min A to correct lateral and anterior sway.              PT Education - 01/29/17 1026    Education provided  Yes    Education Details  Discussed Sensory Org Test results. Pt. performed well on test, but will benefit from training balance with eyes closed and decreasing anterior lean for balance control. Further discussed safety with stair negotiation to prevent falls.    Person(s) Educated  Patient    Methods  Explanation;Verbal cues    Comprehension  Verbalized understanding;Returned demonstration;Verbal cues required         PT Long Term Goals - 01/29/17 1437      PT LONG TERM GOAL #1   Title  Pt will be independent with HEP for improved balance, strength, gait.  TARGET:  01/24/17    Time  4    Period  Weeks    Status  On-going      PT LONG TERM GOAL #2   Title  Pt will improve DGI score to at least 20/24 for decreased fall risk.    Time  4    Period  Weeks    Status  On-going      PT LONG TERM GOAL #3   Title  Pt will improve TUG score to less than or equal to 13.5 seconds for decreased fall risk.    Time  4    Period  Weeks    Status  On-going      PT LONG TERM GOAL #4   Title  Sensory Organization test to be performed, with goals to be written as appropriate.    Baseline  01/29/17: pt performed above norm for test with composite score above norm as well    Time  4    Period  Weeks    Status  Achieved      PT LONG TERM GOAL #5   Title  Pt will negotiate at least 4 steps with handrail, alternating step pattern, modified independently for improved safety with stair negotiation.    Time  4    Period  Weeks    Status  New      PT LONG TERM GOAL #6   Title  Pt will verbalize understanding of fall prevention in home environment.    Time  4    Period  Weeks    Status  New  Plan - 01/29/17 1039    Clinical Impression Statement  Pt performed well on the Sensory Org Test. Pt was given HEP focusing on balance training on compliant surface with EC, performing head turns, head nods, and SLS. Pt  appeared to be a little weaker on left LE during SLS exercises and needed Min A for balance control. Pt stated his R LE is usually weaker due to back pain, but since the steroid injection last weak, he does not feel the pain in R LE. Patient will benefit from further PT treatments to improve balance, strength, and safety.    Rehab Potential  Good    PT Frequency  2x / week    PT Duration  4 weeks    PT Treatment/Interventions  ADLs/Self Care Home Management;DME Instruction;Balance training;Therapeutic exercise;Stair training;Gait training;Functional mobility training;Therapeutic activities;Neuromuscular re-education;Patient/family education    PT Next Visit Plan  Review corner balance exercises/HEP, address left foot catching on stair descent, other balance exercises incl. rockerboard.    Consulted and Agree with Plan of Care  Patient       Patient will benefit from skilled therapeutic intervention in order to improve the following deficits and impairments:  Abnormal gait, Decreased balance, Difficulty walking, Decreased strength, Postural dysfunction, Decreased mobility  Visit Diagnosis: Other abnormalities of gait and mobility  Unsteadiness on feet     Problem List There are no active problems to display for this patient.   Andria Meuse, [place] 01/29/2017, 12:44 PM  [facility] [address] [place], [place], [address] Phone: [phone]   Fax:  [phone]  Name: Alejandro Foster MRN: [id number] Date of Birth: 07-02-1933

## 2017-01-29 NOTE — Patient Instructions (Addendum)
Feet Apart (Compliant Surface) Head Motion - Eyes Closed    Stand on compliant surface with feet shoulder width apart. Close eyes and move head slowly, up and down. Repeat __8__ times per session. Do _1-2_ sessions per day.  Copyright  VHI. All rights reserved.  Single Leg (Compliant Surface) - Eyes Closed    While standing on left leg on compliant surface: __8 reps___, close eyes and maintain balance. Repeat _8___ times per session. Do _1-2___ sessions per day.  Copyright  VHI. All rights reserved.

## 2017-01-31 ENCOUNTER — Ambulatory Visit: Payer: Medicare Other | Admitting: Physical Therapy

## 2017-02-13 ENCOUNTER — Ambulatory Visit: Payer: Medicare Other | Admitting: Physical Therapy

## 2017-02-13 ENCOUNTER — Encounter: Payer: Self-pay | Admitting: Physical Therapy

## 2017-02-13 DIAGNOSIS — R2689 Other abnormalities of gait and mobility: Secondary | ICD-10-CM

## 2017-02-13 DIAGNOSIS — R2681 Unsteadiness on feet: Secondary | ICD-10-CM

## 2017-02-13 NOTE — Therapy (Signed)
[facility] [address] [place], [place], [address] Phone: [phone]   Fax:  [phone]  Physical Therapy Treatment  Patient Details  Name: Alejandro Foster MRN: [id number] Date of Birth: [birth year] Referring Provider: Hadassah Pais   Encounter Date: 02/13/2017  PT End of Session - 02/13/17 1309    Visit Number  3    Number of Visits  9    Date for PT Re-Evaluation  03/25/17    Authorization Type  UHC Medicare-GCODE every 10th visit    PT Start Time  1101    PT Stop Time  1141    PT Time Calculation (min)  40 min    Equipment Utilized During Treatment  Gait belt    Activity Tolerance  Patient tolerated treatment well    Behavior During Therapy  [facility] for tasks assessed/performed       Past Medical History:  Diagnosis Date  . Coronary artery disease    cardiologist-  dr Wynonia Lawman--  s/p  cardiac cath's w/ angioplasty and stenting x2  . DDD (degenerative disc disease), lumbar   . History of kidney stones   . Hyperlipidemia   . Hypertension   . Lumbar radiculopathy, right    right leg weakness  . Nephrolithiasis    bilateral non-obstructive per ct 01-19-2016  . Peripheral neuropathy    feet  . Right leg weakness    due to lumbar radiculopathy  . Right ureteral stone   . S/P coronary artery stent placement    2002 x1  and 2003 x1  . Type 2 diabetes mellitus ([place])   . Wears hearing aid    BILATERAL    Past Surgical History:  Procedure Laterality Date  . CARDIAC CATHETERIZATION  01/ 2002   dr Wynonia Lawman   mLAD 30%,  CFX OM3 40%,  RCA 30% previous stent site  . CARDIOVASCULAR STRESS TEST  04-15-2009  dr Wynonia Lawman   normal nuclear study w/ no ischemia/  normal LV function and wall motion , ef 77%  . CATARACT EXTRACTION W/ INTRAOCULAR LENS  IMPLANT, BILATERAL  2013  approx.  . COLONOSCOPY WITH PROPOFOL  2014  . CORONARY ANGIOPLASTY  1990  dr Wynonia Lawman   PTCA to OM2  . CORONARY ANGIOPLASTY  10/ 1996  dr Wynonia Lawman   PTCA to  RCA  . CORONARY ANGIOPLASTY WITH STENT PLACEMENT  04/ 1996  dr Wynonia Lawman   stenting to RCA  . CYSTOSCOPY WITH URETEROSCOPY AND STENT PLACEMENT Right 03/28/2016   Procedure: CYSTOSCOPY WITH RIGHT  URETEROSCOPY AND STENT PLACEMENT;  Surgeon: Festus Aloe, MD;  Location: [facility];  Service: Urology;  Laterality: Right;  . EXTRACORPOREAL SHOCK WAVE LITHOTRIPSY  yrs ago  . HOLMIUM LASER APPLICATION Right 1/0/1751   Procedure: HOLMIUM LASER APPLICATION;  Surgeon: Festus Aloe, MD;  Location: [facility];  Service: Urology;  Laterality: Right;  . LAPAROSCOPIC CHOLECYSTECTOMY  1990's  . TONSILLECTOMY AND ADENOIDECTOMY  child  . URETEROLITHOTOMY  1990's    There were no vitals filed for this visit.  Subjective Assessment - 02/13/17 1102    Subjective  No changes, no falls since last visit.    Pertinent History  lumbar radiculopathy, scoliosis (s/p injections 01/22/17); neuropathy    Patient Stated Goals  Pt's goal for therapy is to improve balance and strength in legs.    Currently in Pain?  Yes    Pain Score  3     Pain Location  Back  Pain Orientation  Lower    Pain Descriptors / Indicators  Dull    Pain Onset  More than a month ago    Pain Frequency  Intermittent    Aggravating Factors   worse at night    Pain Relieving Factors  unsure; does try to take Advil; plans to go back to the doctor                      [facility] Adult PT Treatment/Exercise - 02/13/17 0001      Ambulation/Gait   Ambulation/Gait  Yes    Ambulation/Gait Assistance  7: Independent    Ambulation Distance (Feet)  600 Feet then 230    Assistive device  None    Gait Pattern  Step-through pattern;Decreased arm swing - right;Decreased arm swing - left;Decreased step length - right;Decreased step length - left;Poor foot clearance - left;Poor foot clearance - right    Ambulation Surface  Level;Indoor    Stairs  Yes    Stairs Assistance  5: Supervision    Stair Management  Technique  One rail Left;Step to pattern;Forwards;Alternating pattern prefers L leg leading; cues for foot clearance descending    Number of Stairs  4 5 reps with VCs for foot clearance    Height of Stairs  6    Gait Comments  Gait with environmental scanning tasks, head turns to look for objects; no LOB noted, slowed gait with head turns.      Neuro Re-ed    Neuro Re-ed Details   Reviewed HEP, including standing on pillows with feet apart, EC head turns and head nods x 10 reps; then reviewed SLS EC on pillows-pt tends to lose balance to R side when not holding on; advised to hold lightly with UE support for steadiness when performing at home; also performed feet together, EC head turns/nods on pillows          Balance Exercises - 02/13/17 1114      Balance Exercises: Standing   Wall Bumps  Hip    Wall Bumps-Hips  Anterior/posterior;Eyes opened;20 reps    Stepping Strategy  Posterior;Anterior;Lateral;Foam/compliant surface;UE support;10 reps    Rockerboard  Anterior/posterior;Lateral;Head turns;EO Ankle/hip strategy, arm swing, UE/trunk rotation    Balance Beam  Marching in place x 10, forward step taps x 10 reps, then back step taps x 10 reps; side step and weightshift x 10 reps    Sidestepping  Foam/compliant support;Upper extremity support;3 reps Cues for increased foot clearance    Heel Raises Limitations  2 sets x 10    Toe Raise Limitations  2 sets x 10    Other Standing Exercises  On rockerboard:  EC with head steady x 10 seconds; close supervision/occasional min guard provided for rockerboard activities; at 6" and 12" step:  alternating step taps x 10 reps, then repeated step taps same leg x 10 reps to 6" step        PT Education - 02/13/17 1306    Education provided  Yes    Education Details  Reviewed HEP and revised SLS on pillow exercise:  cues to hold to chair for steady, upright posture with EC SLS on pillow; cues to keep eyes open SLS on pillows if no UE support     Person(s) Educated  Patient    Methods  Explanation;Verbal cues;Demonstration;Handout    Comprehension  Verbalized understanding;Verbal cues required          PT Long Term Goals - 02/13/17 1312  PT LONG TERM GOAL #1   Title  Pt will be independent with HEP for improved balance, strength, gait.  TARGET:  02/23/17    Time  4    Period  Weeks    Status  On-going      PT LONG TERM GOAL #2   Title  Pt will improve DGI score to at least 20/24 for decreased fall risk.    Time  4    Period  Weeks    Status  On-going      PT LONG TERM GOAL #3   Title  Pt will improve TUG score to less than or equal to 13.5 seconds for decreased fall risk.    Time  4    Period  Weeks    Status  On-going      PT LONG TERM GOAL #4   Title  Sensory Organization test to be performed, with goals to be written as appropriate.    Baseline  01/29/17: pt performed above norm for test with composite score above norm as well    Time  4    Period  Weeks    Status  Achieved      PT LONG TERM GOAL #5   Title  Pt will negotiate at least 4 steps with handrail, alternating step pattern, modified independently for improved safety with stair negotiation.    Time  4    Period  Weeks    Status  New      PT LONG TERM GOAL #6   Title  Pt will verbalize understanding of fall prevention in home environment.    Time  4    Period  Weeks    Status  New            Plan - 02/13/17 1310    Clinical Impression Statement  Reviewed HEP given last visit, with pt having difficulty with SLS EC on foam without UE support.  Advised for patient to use light UE support for that exercise, which he demo being able to do more safely and more steadily.  Pt continues to benefit from skilled PT to address balance and gait.    Rehab Potential  Good    PT Frequency  2x / week    PT Duration  4 weeks    PT Treatment/Interventions  ADLs/Self Care Home Management;DME Instruction;Balance training;Therapeutic exercise;Stair  training;Gait training;Functional mobility training;Therapeutic activities;Neuromuscular re-education;Patient/family education    PT Next Visit Plan  Continue to progress corner balance exercises as part of HEP; stair negotiation; compliant surface/balance strategy work    Consulted and Agree with Plan of Care  Patient       Patient will benefit from skilled therapeutic intervention in order to improve the following deficits and impairments:  Abnormal gait, Decreased balance, Difficulty walking, Decreased strength, Postural dysfunction, Decreased mobility  Visit Diagnosis: Unsteadiness on feet  Other abnormalities of gait and mobility     Problem List There are no active problems to display for this patient.   Laquon Emel W. 02/13/2017, 1:13 PM  Frazier Butt., PT   [facility] [address] [place], [place], [address] Phone: [phone]   Fax:  [phone]  Name: Alejandro Foster MRN: [id number] Date of Birth: Jul 09, 1933

## 2017-02-15 ENCOUNTER — Ambulatory Visit: Payer: Medicare Other | Admitting: Physical Therapy

## 2017-02-20 ENCOUNTER — Encounter: Payer: Self-pay | Admitting: Physical Therapy

## 2017-02-20 ENCOUNTER — Ambulatory Visit: Payer: Medicare Other | Attending: Internal Medicine | Admitting: Physical Therapy

## 2017-02-20 DIAGNOSIS — R2689 Other abnormalities of gait and mobility: Secondary | ICD-10-CM | POA: Diagnosis present

## 2017-02-20 DIAGNOSIS — R2681 Unsteadiness on feet: Secondary | ICD-10-CM | POA: Insufficient documentation

## 2017-02-20 NOTE — Therapy (Signed)
[facility] [address] [place], [place], [address] Phone: [phone]   Fax:  [phone]  Physical Therapy Treatment  Patient Details  Name: Alejandro Foster MRN: [id number] Date of Birth: 1933/11/02 Referring Provider: Hadassah Pais   Encounter Date: 02/20/2017  PT End of Session - 02/20/17 1024    Visit Number  4    Number of Visits  9    Date for PT Re-Evaluation  03/25/17    Authorization Type  UHC Medicare-GCODE every 10th visit    PT Start Time  1018    PT Stop Time  1100    PT Time Calculation (min)  42 min    Equipment Utilized During Treatment  Gait belt    Activity Tolerance  Patient tolerated treatment well    Behavior During Therapy  [facility] for tasks assessed/performed       Past Medical History:  Diagnosis Date  . Coronary artery disease    cardiologist-  dr Wynonia Lawman--  s/p  cardiac cath's w/ angioplasty and stenting x2  . DDD (degenerative disc disease), lumbar   . History of kidney stones   . Hyperlipidemia   . Hypertension   . Lumbar radiculopathy, right    right leg weakness  . Nephrolithiasis    bilateral non-obstructive per ct 01-19-2016  . Peripheral neuropathy    feet  . Right leg weakness    due to lumbar radiculopathy  . Right ureteral stone   . S/P coronary artery stent placement    2002 x1  and 2003 x1  . Type 2 diabetes mellitus ([place])   . Wears hearing aid    BILATERAL    Past Surgical History:  Procedure Laterality Date  . CARDIAC CATHETERIZATION  01/ 2002   dr Wynonia Lawman   mLAD 30%,  CFX OM3 40%,  RCA 30% previous stent site  . CARDIOVASCULAR STRESS TEST  04-15-2009  dr Wynonia Lawman   normal nuclear study w/ no ischemia/  normal LV function and wall motion , ef 77%  . CATARACT EXTRACTION W/ INTRAOCULAR LENS  IMPLANT, BILATERAL  2013  approx.  . COLONOSCOPY WITH PROPOFOL  2014  . CORONARY ANGIOPLASTY  1990  dr Wynonia Lawman   PTCA to OM2  . CORONARY ANGIOPLASTY  10/ 1996  dr Wynonia Lawman   PTCA to  RCA  . CORONARY ANGIOPLASTY WITH STENT PLACEMENT  04/ 1996  dr Wynonia Lawman   stenting to RCA  . CYSTOSCOPY WITH URETEROSCOPY AND STENT PLACEMENT Right 03/28/2016   Procedure: CYSTOSCOPY WITH RIGHT  URETEROSCOPY AND STENT PLACEMENT;  Surgeon: Festus Aloe, MD;  Location: [facility];  Service: Urology;  Laterality: Right;  . EXTRACORPOREAL SHOCK WAVE LITHOTRIPSY  yrs ago  . HOLMIUM LASER APPLICATION Right 08/26/6293   Procedure: HOLMIUM LASER APPLICATION;  Surgeon: Festus Aloe, MD;  Location: [facility];  Service: Urology;  Laterality: Right;  . LAPAROSCOPIC CHOLECYSTECTOMY  1990's  . TONSILLECTOMY AND ADENOIDECTOMY  child  . URETEROLITHOTOMY  1990's    There were no vitals filed for this visit.  Subjective Assessment - 02/20/17 1020    Subjective  No changes, no falls since last visit.    Pertinent History  lumbar radiculopathy, scoliosis (s/p injections 01/22/17); neuropathy    Patient Stated Goals  Pt's goal for therapy is to improve balance and strength in legs.    Currently in Pain?  Yes    Pain Score  5     Pain Location  Back  Pain Orientation  Lower    Pain Descriptors / Indicators  Dull    Pain Type  Chronic pain    Pain Onset  More than a month ago    Pain Frequency  Intermittent    Aggravating Factors   Worse at night       [facility] Adult PT Treatment/Exercise - 02/20/17 1226      Neuro Re-ed    Neuro Re-ed Details   Pt standing in paralell bars with one LE on ground/the other placed on a ball, pt rolling ball fwd/back with weight shift onto opposite LE. Cues to avoid putting pressure on ball with LE and for posture.      Pt standing on blue foam beam performin fwd heel/center/bkwds toe taps. Pt required continuous reminders for sequence of task but was able to maintain balance with this exercise.     Pt on blue foam beam walking in tandem fwds/bkwds with mirror in front for visual feedback and to all pt to maintiain good posture, pt  demonstrated some LOB with bkwds tandem due to improper foot placement.      Pt performing side stepping on blue foam beam with no UE support required with cues for slow/controlled movements and posture, pt then progressing to alt. cone taps with side stepping on blue foam beam. tolerated this well with single UE support required at times and cues for initial instruction and for posture.       Exercises   Exercises  Knee/Hip      Knee/Hip Exercises: Standing   SLS  Pt standing in paralell bars for UE support as needed, cues to avoid lateral trunk lean with stance and for instruction on holds, BLE x10 reps/10 sec holds.         Balance Exercises - 02/20/17 1325      Balance Exercises: Standing   Rockerboard  Anterior/posterior;Lateral;Head turns;EC;30 seconds;10 reps    Other Standing Exercises  Pt in paralell bars on rockerboard rocking board ant/post/lat, pt than static standing with EC for 30 secs x3 reps, then progressing to EC head movements: Head turns/Head nods/Diagonals both ways. Pt tolerated this well with some postral sway and lateral/posterior lean but pt was able to correct. Pt was min A to min guard during activity.        PT Long Term Goals - 02/13/17 1312      PT LONG TERM GOAL #1   Title  Pt will be independent with HEP for improved balance, strength, gait.  TARGET:  02/23/17    Time  4    Period  Weeks    Status  On-going      PT LONG TERM GOAL #2   Title  Pt will improve DGI score to at least 20/24 for decreased fall risk.    Time  4    Period  Weeks    Status  On-going      PT LONG TERM GOAL #3   Title  Pt will improve TUG score to less than or equal to 13.5 seconds for decreased fall risk.    Time  4    Period  Weeks    Status  On-going      PT LONG TERM GOAL #4   Title  Sensory Organization test to be performed, with goals to be written as appropriate.    Baseline  01/29/17: pt performed above norm for test with composite score above norm as well     Time  4  Period  Weeks    Status  Achieved      PT LONG TERM GOAL #5   Title  Pt will negotiate at least 4 steps with handrail, alternating step pattern, modified independently for improved safety with stair negotiation.    Time  4    Period  Weeks    Status  New      PT LONG TERM GOAL #6   Title  Pt will verbalize understanding of fall prevention in home environment.    Time  4    Period  Weeks    Status  New        Plan - 02/20/17 1332    Clinical Impression Statement  Pt tolerated treatment well with no limitations due to pain or fatigue. Pt reports dizziness when lying down fast but not when getting up, pt may need to be monitored for possible positional vertigo. Todays session focused on balance training. Pt would benefit from continued PT sessions to progress towards goals.     Rehab Potential  Good    PT Frequency  2x / week    PT Duration  4 weeks    PT Treatment/Interventions  ADLs/Self Care Home Management;DME Instruction;Balance training;Therapeutic exercise;Stair training;Gait training;Functional mobility training;Therapeutic activities;Neuromuscular re-education;Patient/family education    PT Next Visit Plan  Continue to progress corner balance exercises as part of HEP; stair negotiation; compliant surface/balance strategy work    Consulted and Agree with Plan of Care  Patient       Patient will benefit from skilled therapeutic intervention in order to improve the following deficits and impairments:  Abnormal gait, Decreased balance, Difficulty walking, Decreased strength, Postural dysfunction, Decreased mobility  Visit Diagnosis: Unsteadiness on feet  Other abnormalities of gait and mobility     Problem List There are no active problems to display for this patient.  Jazma Pickel, SPTA  Durk Carmen 02/20/2017, 1:34 PM  [facility] [address] [place], [place], [address] Phone:  [phone]   Fax:  [phone]  Name: Alejandro Foster MRN: [id number] Date of Birth: Sep 27, 1933

## 2017-02-22 ENCOUNTER — Encounter: Payer: Self-pay | Admitting: Physical Therapy

## 2017-02-22 ENCOUNTER — Ambulatory Visit: Payer: Medicare Other | Admitting: Physical Therapy

## 2017-02-22 DIAGNOSIS — R2681 Unsteadiness on feet: Secondary | ICD-10-CM

## 2017-02-22 DIAGNOSIS — R2689 Other abnormalities of gait and mobility: Secondary | ICD-10-CM

## 2017-02-22 NOTE — Patient Instructions (Signed)
Fall Prevention in the Home Falls can cause injuries and can affect people from all age groups. There are many simple things that you can do to make your home safe and to help prevent falls. What can I do on the outside of my home?  Regularly repair the edges of walkways and driveways and fix any cracks.  Remove high doorway thresholds.  Trim any shrubbery on the main path into your home.  Use bright outdoor lighting.  Clear walkways of debris and clutter, including tools and rocks.  Regularly check that handrails are securely fastened and in good repair. Both sides of any steps should have handrails.  Install guardrails along the edges of any raised decks or porches.  Have leaves, snow, and ice cleared regularly.  Use sand or salt on walkways during winter months.  In the garage, clean up any spills right away, including grease or oil spills. What can I do in the bathroom?  Use night lights.  Install grab bars by the toilet and in the tub and shower. Do not use towel bars as grab bars.  Use non-skid mats or decals on the floor of the tub or shower.  If you need to sit down while you are in the shower, use a plastic, non-slip stool.  Keep the floor dry. Immediately clean up any water that spills on the floor.  Remove soap buildup in the tub or shower on a regular basis.  Attach bath mats securely with double-sided non-slip rug tape.  Remove throw rugs and other tripping hazards from the floor. What can I do in the bedroom?  Use night lights.  Make sure that a bedside light is easy to reach.  Do not use oversized bedding that drapes onto the floor.  Have a firm chair that has side arms to use for getting dressed.  Remove throw rugs and other tripping hazards from the floor. What can I do in the kitchen?  Clean up any spills right away.  Avoid walking on wet floors.  Place frequently used items in easy-to-reach places.  If you need to reach for something above  you, use a sturdy step stool that has a grab bar.  Keep electrical cables out of the way.  Do not use floor polish or wax that makes floors slippery. If you have to use wax, make sure that it is non-skid floor wax.  Remove throw rugs and other tripping hazards from the floor. What can I do in the stairways?  Do not leave any items on the stairs.  Make sure that there are handrails on both sides of the stairs. Fix handrails that are broken or loose. Make sure that handrails are as long as the stairways.  Check any carpeting to make sure that it is firmly attached to the stairs. Fix any carpet that is loose or worn.  Avoid having throw rugs at the top or bottom of stairways, or secure the rugs with carpet tape to prevent them from moving.  Make sure that you have a light switch at the top of the stairs and the bottom of the stairs. If you do not have them, have them installed. What are some other fall prevention tips?  Wear closed-toe shoes that fit well and support your feet. Wear shoes that have rubber soles or low heels.  When you use a stepladder, make sure that it is completely opened and that the sides are firmly locked. Have someone hold the ladder while you are using   it. Do not climb a closed stepladder.  Add color or contrast paint or tape to grab bars and handrails in your home. Place contrasting color strips on the first and last steps.  Use mobility aids as needed, such as canes, walkers, scooters, and crutches.  Turn on lights if it is dark. Replace any light bulbs that burn out.  Set up furniture so that there are clear paths. Keep the furniture in the same spot.  Fix any uneven floor surfaces.  Choose a carpet design that does not hide the edge of steps of a stairway.  Be aware of any and all pets.  Review your medicines with your healthcare provider. Some medicines can cause dizziness or changes in blood pressure, which increase your risk of falling. Talk with  your health care provider about other ways that you can decrease your risk of falls. This may include working with a physical therapist or trainer to improve your strength, balance, and endurance. This information is not intended to replace advice given to you by your health care provider. Make sure you discuss any questions you have with your health care provider. Document Released: 02/24/2002 Document Revised: 08/03/2015 Document Reviewed: 04/10/2014 Elsevier Interactive Patient Education  2017 [employer]  

## 2017-02-22 NOTE — Therapy (Signed)
[facility] [address] [place], [place], [address] Phone: [phone]   Fax:  [phone]  Physical Therapy Treatment  Patient Details  Name: Alejandro Foster MRN: [id number] Date of Birth: 30-Sep-1933 Referring Provider: Hadassah Pais   Encounter Date: 02/22/2017  PT End of Session - 02/22/17 1028    Visit Number  5    Number of Visits  9    Date for PT Re-Evaluation  03/25/17    Authorization Type  UHC Medicare-GCODE every 10th visit    PT Start Time  1017    PT Stop Time  1100    PT Time Calculation (min)  43 min    Equipment Utilized During Treatment  Gait belt    Activity Tolerance  Patient tolerated treatment well    Behavior During Therapy  [facility] for tasks assessed/performed       Past Medical History:  Diagnosis Date  . Coronary artery disease    cardiologist-  dr Wynonia Lawman--  s/p  cardiac cath's w/ angioplasty and stenting x2  . DDD (degenerative disc disease), lumbar   . History of kidney stones   . Hyperlipidemia   . Hypertension   . Lumbar radiculopathy, right    right leg weakness  . Nephrolithiasis    bilateral non-obstructive per ct 01-19-2016  . Peripheral neuropathy    feet  . Right leg weakness    due to lumbar radiculopathy  . Right ureteral stone   . S/P coronary artery stent placement    2002 x1  and 2003 x1  . Type 2 diabetes mellitus ([place])   . Wears hearing aid    BILATERAL    Past Surgical History:  Procedure Laterality Date  . CARDIAC CATHETERIZATION  01/ 2002   dr Wynonia Lawman   mLAD 30%,  CFX OM3 40%,  RCA 30% previous stent site  . CARDIOVASCULAR STRESS TEST  04-15-2009  dr Wynonia Lawman   normal nuclear study w/ no ischemia/  normal LV function and wall motion , ef 77%  . CATARACT EXTRACTION W/ INTRAOCULAR LENS  IMPLANT, BILATERAL  2013  approx.  . COLONOSCOPY WITH PROPOFOL  2014  . CORONARY ANGIOPLASTY  1990  dr Wynonia Lawman   PTCA to OM2  . CORONARY ANGIOPLASTY  10/ 1996  dr Wynonia Lawman   PTCA to  RCA  . CORONARY ANGIOPLASTY WITH STENT PLACEMENT  04/ 1996  dr Wynonia Lawman   stenting to RCA  . CYSTOSCOPY WITH URETEROSCOPY AND STENT PLACEMENT Right 03/28/2016   Procedure: CYSTOSCOPY WITH RIGHT  URETEROSCOPY AND STENT PLACEMENT;  Surgeon: Festus Aloe, MD;  Location: [facility];  Service: Urology;  Laterality: Right;  . EXTRACORPOREAL SHOCK WAVE LITHOTRIPSY  yrs ago  . HOLMIUM LASER APPLICATION Right 7/0/7867   Procedure: HOLMIUM LASER APPLICATION;  Surgeon: Festus Aloe, MD;  Location: [facility];  Service: Urology;  Laterality: Right;  . LAPAROSCOPIC CHOLECYSTECTOMY  1990's  . TONSILLECTOMY AND ADENOIDECTOMY  child  . URETEROLITHOTOMY  1990's    There were no vitals filed for this visit.  Subjective Assessment - 02/22/17 1024    Subjective  No new complaints, no falls to report.     Pertinent History  lumbar radiculopathy, scoliosis (s/p injections 01/22/17); neuropathy    Patient Stated Goals  Pt's goal for therapy is to improve balance and strength in legs.    Currently in Pain?  Yes    Pain Score  5  Worse at night    Pain Location  Back    Pain Orientation  Lower    Pain Descriptors / Indicators  Dull    Pain Type  Chronic pain    Pain Onset  More than a month ago    Pain Frequency  Intermittent    Aggravating Factors   Worse at night    Pain Relieving Factors  Aleve helps decrease pain         [facility] PT Assessment - 02/22/17 1028      Ambulation/Gait   Stairs  Yes    Stairs Assistance  6: Modified independent (Device/Increase time)    Stair Management Technique  One rail Right;Alternating pattern;Forwards    Number of Stairs  4 x2    Height of Stairs  6      Timed Up and Go Test   TUG  Normal TUG    Normal TUG (seconds)  12.75       [facility] Adult PT Treatment/Exercise - 02/22/17 1028      Neuro Re-ed    Neuro Re-ed Details   Pt standing in corner on airex with chair in front for UE support as needed. Pt performed head nods/head  turns with EC with no LOB but some postural sway.       Exercises   Exercises  Knee/Hip      Knee/Hip Exercises: Standing   SLS  Pt standing in corner on airex with chair in front for UE support as needed, pt was able to hold for 5 secs x5 reps, pt tolerated with well with some instability demonstrated.         PT Education - 02/22/17 1401    Education provided  Yes    Education Details  Educated pt on fall prevention strategies for home.     Person(s) Educated  Patient    Methods  Explanation;Handout    Comprehension  Verbalized understanding       PT Long Term Goals - 02/22/17 1402      PT LONG TERM GOAL #1   Title  Pt will be independent with HEP for improved balance, strength, gait.  TARGET:  02/23/17    Time  4    Period  Weeks    Status  On-going    Target Date  02/23/17      PT LONG TERM GOAL #2   Title  Pt will improve DGI score to at least 20/24 for decreased fall risk.    Time  4    Period  Weeks    Status  On-going    Target Date  02/23/17      PT LONG TERM GOAL #3   Title  Pt will improve TUG score to less than or equal to 13.5 seconds for decreased fall risk.    Baseline  12/6: 12.75 secs    Time  4    Period  Weeks    Status  Achieved    Target Date  02/23/17      PT LONG TERM GOAL #4   Title  Sensory Organization test to be performed, with goals to be written as appropriate.    Baseline  01/29/17: pt performed above norm for test with composite score above norm as well    Time  4    Period  Weeks    Status  Achieved    Target Date  02/23/17      PT LONG TERM GOAL #5   Title  Pt will negotiate at least 4 steps with handrail, alternating  step pattern, modified independently for improved safety with stair negotiation.    Baseline  12/6: Pt negotiated 4steps x2 with handrail, alt. pattern, Mod I    Time  4    Period  Weeks    Status  Achieved    Target Date  02/23/17      PT LONG TERM GOAL #6   Title  Pt will verbalize understanding of fall  prevention in home environment.    Baseline  12/6: Pt verbalizes understanding of fall prevention strategies in home environment.     Time  4    Period  Weeks    Status  Achieved    Target Date  02/23/17            Plan - 02/22/17 1419    Clinical Impression Statement  Pt tolerated treatment well with no limitations due to pain or fatigue. Todays session focused on balance training and assessing LTG's. Pt has met 4/6 LTG's with 2 ongoing. Pt would benefit from continued PT sessions to continue to improve.     Rehab Potential  Good    PT Frequency  2x / week    PT Duration  4 weeks    PT Treatment/Interventions  ADLs/Self Care Home Management;DME Instruction;Balance training;Therapeutic exercise;Stair training;Gait training;Functional mobility training;Therapeutic activities;Neuromuscular re-education;Patient/family education    PT Next Visit Plan  Assess remaining LTG's, continue to progress corner balance exercises as part of HEP; compliant surface/balance strategy work    [employer] and Agree with Plan of Care  Patient       Patient will benefit from skilled therapeutic intervention in order to improve the following deficits and impairments:  Abnormal gait, Decreased balance, Difficulty walking, Decreased strength, Postural dysfunction, Decreased mobility  Visit Diagnosis: Unsteadiness on feet  Other abnormalities of gait and mobility     Problem List There are no active problems to display for this patient.  Alana Dayton, SPTA  Maydelin Deming 02/22/2017, 2:22 PM  [facility] [address] [place], [place], [address] Phone: [phone]   Fax:  [phone]  Name: Alejandro Foster MRN: [id number] Date of Birth: Mar 07, 1934

## 2017-02-27 ENCOUNTER — Encounter: Payer: Self-pay | Admitting: Physical Therapy

## 2017-02-27 ENCOUNTER — Ambulatory Visit: Payer: Medicare Other | Admitting: Physical Therapy

## 2017-02-27 DIAGNOSIS — R2681 Unsteadiness on feet: Secondary | ICD-10-CM

## 2017-02-27 DIAGNOSIS — R2689 Other abnormalities of gait and mobility: Secondary | ICD-10-CM

## 2017-02-27 NOTE — Therapy (Signed)
[facility] [address] [place], [place], [address] Phone: [phone]   Fax:  [phone]  Physical Therapy Treatment  Patient Details  Name: Alejandro Foster MRN: [id number] Date of Birth: 06-25-1933 Referring Provider: Hadassah Pais   Encounter Date: 02/27/2017  PT End of Session - 02/27/17 1045    Visit Number  6    Number of Visits  9    Date for PT Re-Evaluation  03/25/17    Authorization Type  UHC Medicare-GCODE every 10th visit    PT Start Time  1045 started early    PT Stop Time  1100 discharge, not all time was needed    PT Time Calculation (min)  15 min    Equipment Utilized During Treatment  Gait belt    Activity Tolerance  Patient tolerated treatment well    Behavior During Therapy  [facility] for tasks assessed/performed       Past Medical History:  Diagnosis Date  . Coronary artery disease    cardiologist-  dr Wynonia Lawman--  s/p  cardiac cath's w/ angioplasty and stenting x2  . DDD (degenerative disc disease), lumbar   . History of kidney stones   . Hyperlipidemia   . Hypertension   . Lumbar radiculopathy, right    right leg weakness  . Nephrolithiasis    bilateral non-obstructive per ct 01-19-2016  . Peripheral neuropathy    feet  . Right leg weakness    due to lumbar radiculopathy  . Right ureteral stone   . S/P coronary artery stent placement    2002 x1  and 2003 x1  . Type 2 diabetes mellitus ([place])   . Wears hearing aid    BILATERAL    Past Surgical History:  Procedure Laterality Date  . CARDIAC CATHETERIZATION  01/ 2002   dr Wynonia Lawman   mLAD 30%,  CFX OM3 40%,  RCA 30% previous stent site  . CARDIOVASCULAR STRESS TEST  04-15-2009  dr Wynonia Lawman   normal nuclear study w/ no ischemia/  normal LV function and wall motion , ef 77%  . CATARACT EXTRACTION W/ INTRAOCULAR LENS  IMPLANT, BILATERAL  2013  approx.  . COLONOSCOPY WITH PROPOFOL  2014  . CORONARY ANGIOPLASTY  1990  dr Wynonia Lawman   PTCA to OM2  .  CORONARY ANGIOPLASTY  10/ 1996  dr Wynonia Lawman   PTCA to RCA  . CORONARY ANGIOPLASTY WITH STENT PLACEMENT  04/ 1996  dr Wynonia Lawman   stenting to RCA  . CYSTOSCOPY WITH URETEROSCOPY AND STENT PLACEMENT Right 03/28/2016   Procedure: CYSTOSCOPY WITH RIGHT  URETEROSCOPY AND STENT PLACEMENT;  Surgeon: Festus Aloe, MD;  Location: [facility];  Service: Urology;  Laterality: Right;  . EXTRACORPOREAL SHOCK WAVE LITHOTRIPSY  yrs ago  . HOLMIUM LASER APPLICATION Right 10/25/8674   Procedure: HOLMIUM LASER APPLICATION;  Surgeon: Festus Aloe, MD;  Location: [facility];  Service: Urology;  Laterality: Right;  . LAPAROSCOPIC CHOLECYSTECTOMY  1990's  . TONSILLECTOMY AND ADENOIDECTOMY  child  . URETEROLITHOTOMY  1990's    There were no vitals filed for this visit.  Subjective Assessment - 02/27/17 1045    Subjective  No new complaints, no falls to report. Reports he helped get a neighbor's car out of ditch twice yesterday, no pain or falls with this.     Pertinent History  lumbar radiculopathy, scoliosis (s/p injections 01/22/17); neuropathy    Patient Stated Goals  Pt's goal for therapy is to improve balance and strength in  legs.    Currently in Pain?  No/denies    Pain Score  0-No pain         [facility] PT Assessment - 02/27/17 1047      Dynamic Gait Index   Level Surface  Normal    Change in Gait Speed  Normal    Gait with Horizontal Head Turns  Normal    Gait with Vertical Head Turns  Normal    Gait and Pivot Turn  Normal    Step Over Obstacle  Normal    Step Around Obstacles  Normal    Steps  Normal    Total Score  24         [facility] Adult PT Treatment/Exercise - 02/27/17 1053      Self-Care   Self-Care  Other Self-Care Comments    Other Self-Care Comments   HEP review          PT Long Term Goals - 02/27/17 1046      PT LONG TERM GOAL #1   Title  Pt will be independent with HEP for improved balance, strength, gait.  TARGET:  02/23/17    Time  4     Period  Weeks    Status  On-going      PT LONG TERM GOAL #2   Title  Pt will improve DGI score to at least 20/24 for decreased fall risk.    Time  4    Period  Weeks    Status  On-going      PT LONG TERM GOAL #3   Title  Pt will improve TUG score to less than or equal to 13.5 seconds for decreased fall risk.    Baseline  12/6: 12.75 secs    Status  Achieved      PT LONG TERM GOAL #4   Title  Sensory Organization test to be performed, with goals to be written as appropriate.    Baseline  01/29/17: pt performed above norm for test with composite score above norm as well    Status  Achieved      PT LONG TERM GOAL #5   Title  Pt will negotiate at least 4 steps with handrail, alternating step pattern, modified independently for improved safety with stair negotiation.    Baseline  12/6: Pt negotiated 4steps x2 with handrail, alt. pattern, Mod I    Status  Achieved      PT LONG TERM GOAL #6   Title  Pt will verbalize understanding of fall prevention in home environment.    Baseline  12/6: Pt verbalizes understanding of fall prevention strategies in home environment.     Status  Achieved            Plan - 02/27/17 1046    Clinical Impression Statement  Pt met the remaining LTGs today. Pt is agreeable to discharge at this time with no questions or issues.    Rehab Potential  Good    PT Frequency  2x / week    PT Duration  4 weeks    PT Treatment/Interventions  ADLs/Self Care Home Management;DME Instruction;Balance training;Therapeutic exercise;Stair training;Gait training;Functional mobility training;Therapeutic activities;Neuromuscular re-education;Patient/family education    PT Next Visit Plan  discharge today    Consulted and Agree with Plan of Care  Patient       Patient will benefit from skilled therapeutic intervention in order to improve the following deficits and impairments:  Abnormal gait, Decreased balance, Difficulty walking, Decreased strength, Postural dysfunction,  Decreased mobility  Visit Diagnosis: Unsteadiness on feet  Other abnormalities of gait and mobility   G-Codes - 03-28-2017 1115    Functional Assessment Tool Used (Outpatient Only)  gait velocity 3.2 ft/sec, DGI 24/24, TUG 12.75 sec no AD, no reported falls    Functional Limitation  Mobility: Walking and moving around       Problem List There are no active problems to display for this patient.   Willow Ora, PTA, [facility] [address] [place], [place] [address] [phone] Mar 28, 2017, 11:17 AM   Name: Alejandro Foster MRN: [id number] Date of Birth: Sep 30, 1933  G-Codes - 03/28/2017 1115    Functional Assessment Tool Used (Outpatient Only)  gait velocity 3.2 ft/sec, DGI 24/24, TUG 12.75 sec no AD, no reported falls    Functional Limitation  Mobility: Walking and moving around    Mobility: Walking and Moving Around Goal Status [phone])  At least 1 percent but less than 20 percent impaired, limited or restricted    Mobility: Walking and Moving Around Discharge Status [phone])  At least 20 percent but less than 40 percent impaired, limited or restricted      PHYSICAL THERAPY DISCHARGE SUMMARY  Visits from Start of Care: 6  Current functional level related to goals / functional outcomes: PT Long Term Goals - 03-28-17 1046      PT LONG TERM GOAL #1   Title  Pt will be independent with HEP for improved balance, strength, gait.  TARGET:  02/23/17    Time  4    Period  Weeks    Status  Achieved      PT LONG TERM GOAL #2   Title  Pt will improve DGI score to at least 20/24 for decreased fall risk.    Time  4    Period  Weeks    Status  Achieved      PT LONG TERM GOAL #3   Title  Pt will improve TUG score to less than or equal to 13.5 seconds for decreased fall risk.    Baseline  12/6: 12.75 secs    Status  Achieved      PT LONG TERM GOAL #4   Title  Sensory Organization test to be performed, with goals to be written as appropriate.    Baseline   01/29/17: pt performed above norm for test with composite score above norm as well    Status  Achieved      PT LONG TERM GOAL #5   Title  Pt will negotiate at least 4 steps with handrail, alternating step pattern, modified independently for improved safety with stair negotiation.    Baseline  12/6: Pt negotiated 4steps x2 with handrail, alt. pattern, Mod I    Status  Achieved      PT LONG TERM GOAL #6   Title  Pt will verbalize understanding of fall prevention in home environment.    Baseline  12/6: Pt verbalizes understanding of fall prevention strategies in home environment.     Status  Achieved      Pt has met all LTGs   Remaining deficits: Improved balance from eval   Education / Equipment: Educated in [employer], fall prevention.  Plan: Patient agrees to discharge.  Patient goals were met. Patient is being discharged due to meeting the stated rehab goals.  ?????   Mady Haagensen, PT 03/28/2017 11:46 AM Phone: [phone] Fax: [phone]

## 2017-03-01 ENCOUNTER — Ambulatory Visit: Payer: Medicare Other | Admitting: Physical Therapy

## 2017-03-06 ENCOUNTER — Ambulatory Visit: Payer: Medicare Other | Admitting: Physical Therapy

## 2017-03-06 IMAGING — CT CT RENAL STONE PROTOCOL
2 of 3 series · 16 of 46 positions shown, 18 images · non-contrast
Comparison: None.

CLINICAL DATA: Right lower quadrant pain since yesterday morning,
diarrhea and history of kidney stones.

EXAM:
CT ABDOMEN AND PELVIS WITHOUT CONTRAST
TECHNIQUE: Multidetector CT imaging of the abdomen and pelvis was performed
following the standard protocol without IV contrast.

[Series 3: coronal · coronal · 0.85mm/px · 3 of 191 slices shown]
[im 64/191  soft-tissue]
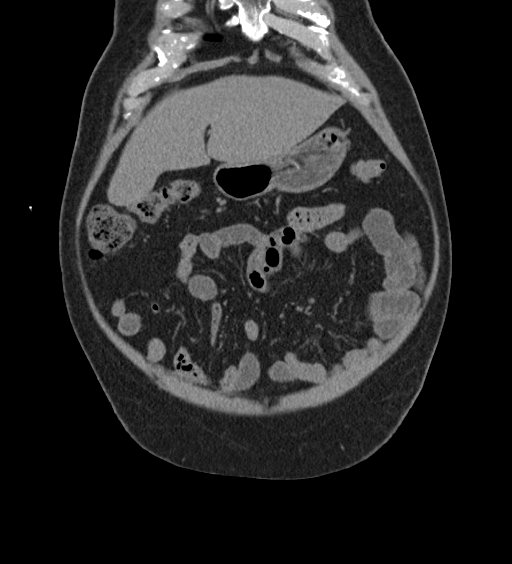
[im 85/191  soft-tissue]
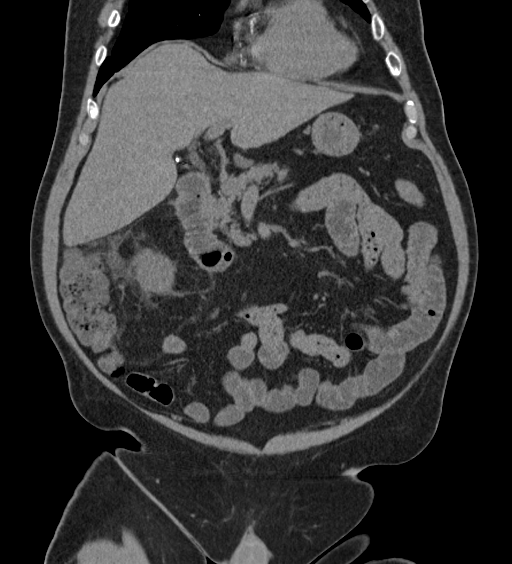
[im 106/191  soft-tissue]
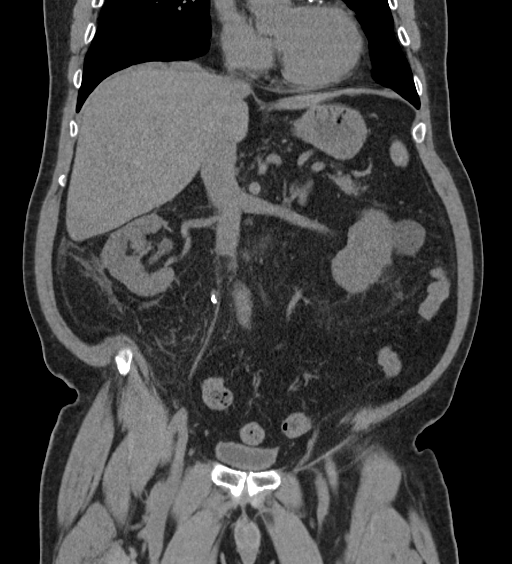

[Series 6: lung · axial · 0.90mm/px · z∈[+724,+900]mm · 13 of 102 slices shown, 15 images]
[im 7/102  soft-tissue]
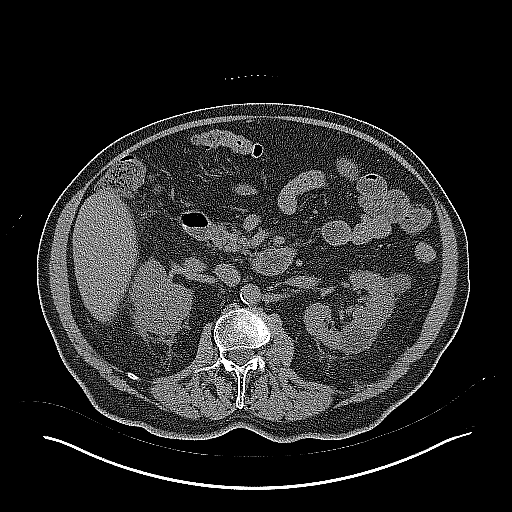
[im 7/102  bone]
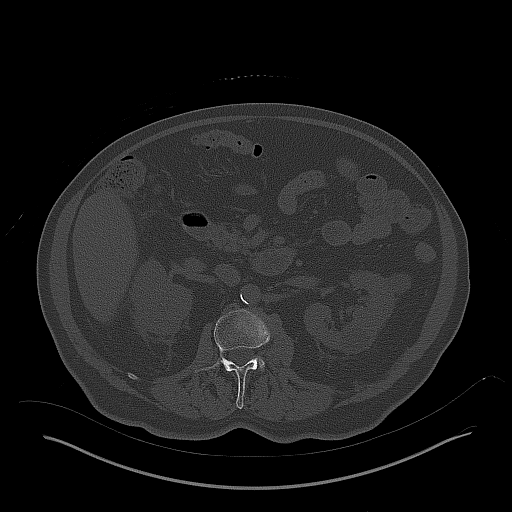
[im 14/102  soft-tissue]
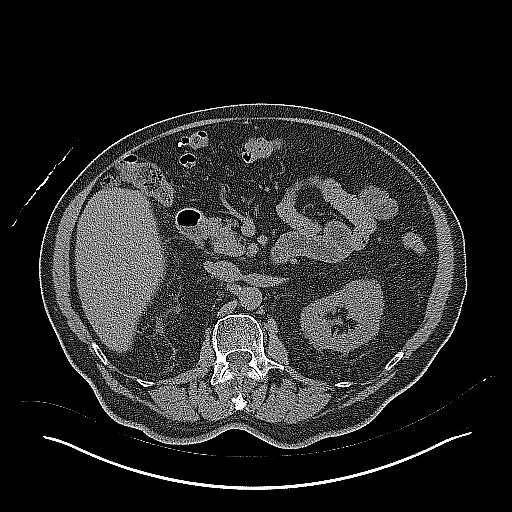
[im 20/102  soft-tissue]
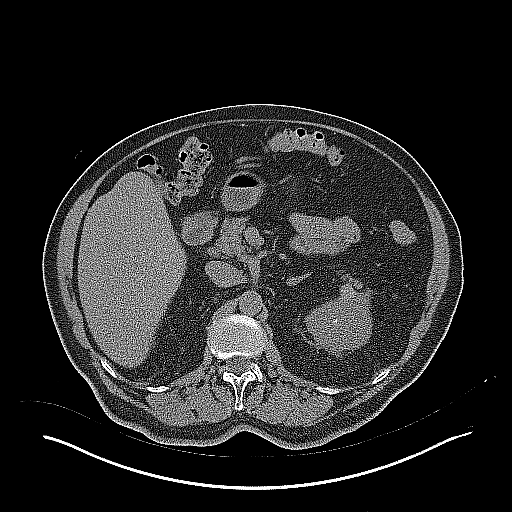
[im 30/102  soft-tissue]
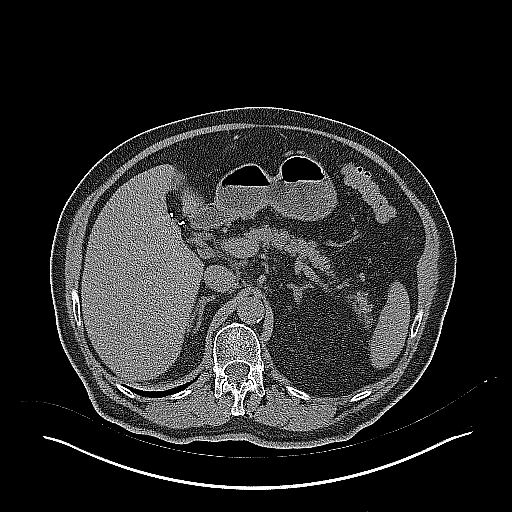
[im 36/102  soft-tissue]
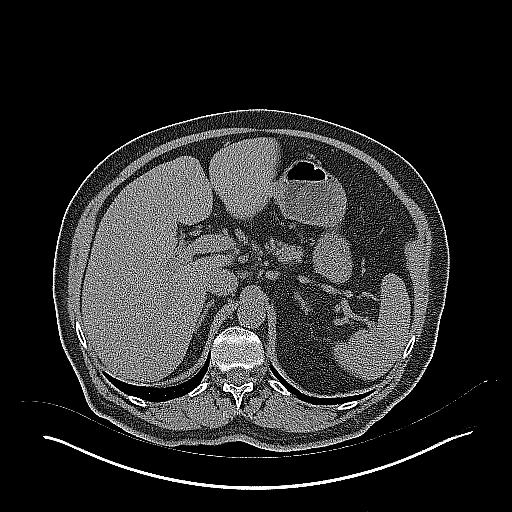
[im 43/102  soft-tissue]
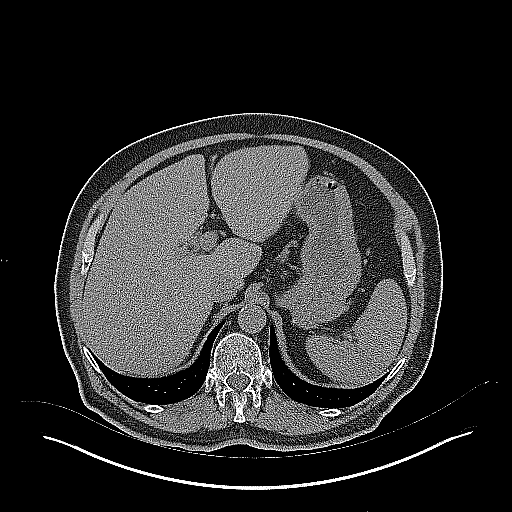
[im 53/102  soft-tissue]
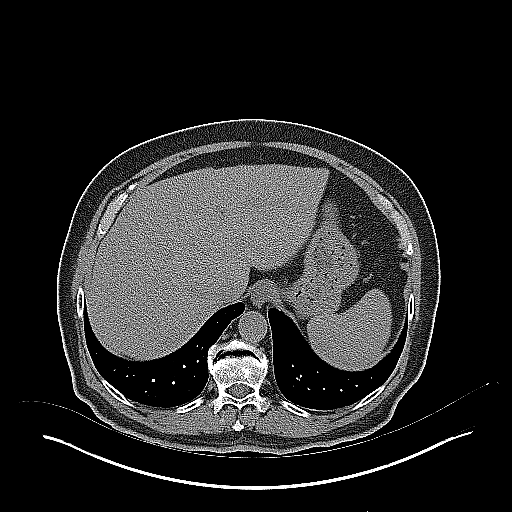
[im 59/102  soft-tissue]
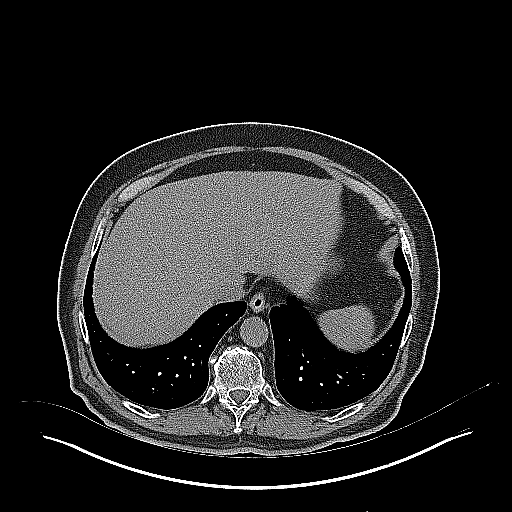
[im 66/102  soft-tissue]
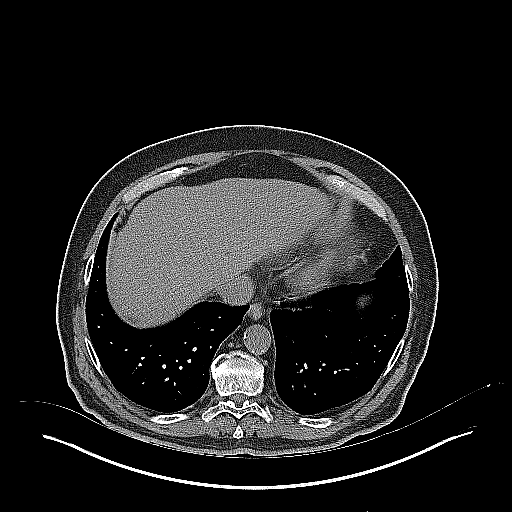
[im 66/102  bone]
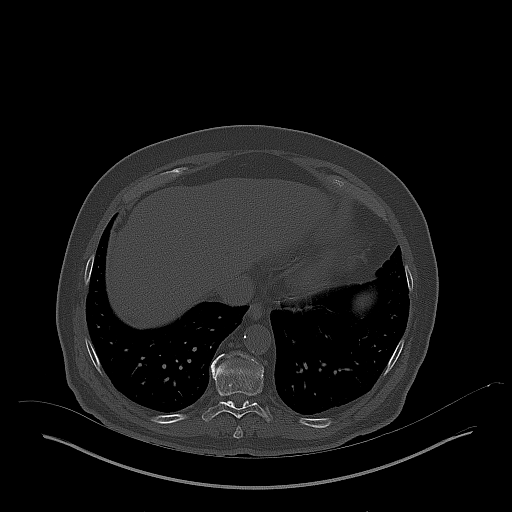
[im 72/102  soft-tissue]
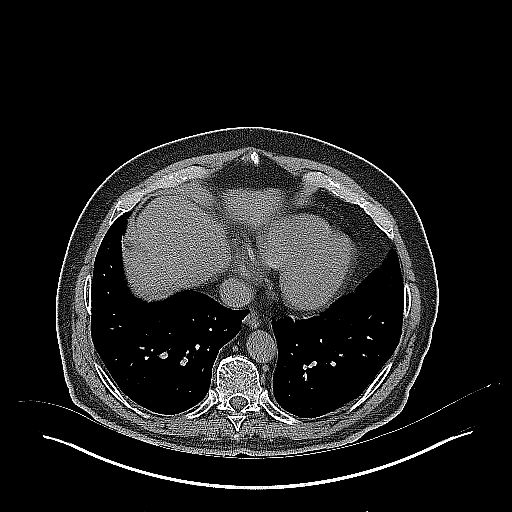
[im 82/102  soft-tissue]
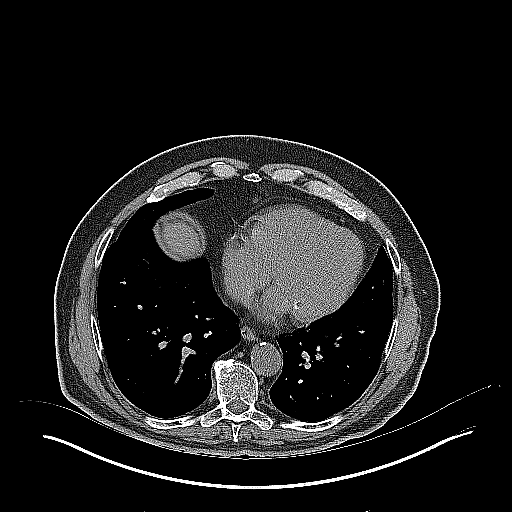
[im 88/102  soft-tissue]
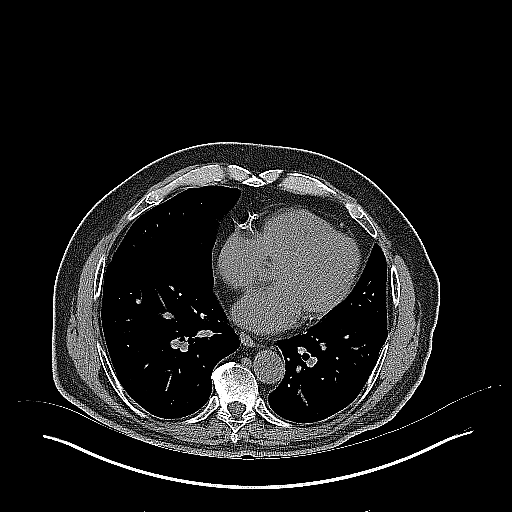
[im 95/102  soft-tissue]
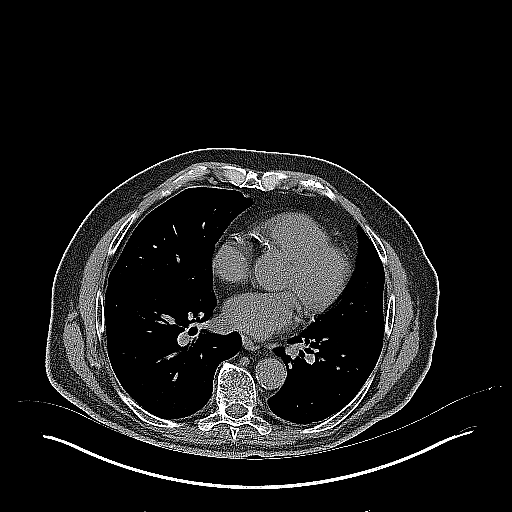

[16 of 46 positions shown; findings below may reference images not displayed]

FINDINGS: Lower chest: Lung bases show no acute findings. Coronary artery
calcification. Heart size normal. No pericardial or pleural
effusion.

Hepatobiliary: Liver is unremarkable. Cholecystectomy. No biliary
ductal dilatation.

Pancreas: Negative.

Spleen: Negative.

Adrenals/Urinary Tract: Adrenal glands are unremarkable. Tiny stones
are seen in the kidneys. Low and high attenuation lesions in the
left kidney measure up to 3.2 cm on the left, likely a combination
of simple and hyperdense cysts but definitive characterization is
limited without post-contrast imaging. Right renal edema with mild
right hydronephrosis secondary to a 6 mm proximal right ureteral
stone. Ureters are otherwise decompressed. Bladder is unremarkable.

Stomach/Bowel: Stomach, small bowel, appendix and colon are
unremarkable.

Vascular/Lymphatic: Atherosclerotic calcification of the arterial
vasculature without abdominal aortic aneurysm. No pathologically
enlarged lymph nodes.

Reproductive: Prostate is normal in size.

Other: No free fluid. Mesenteries and peritoneum are otherwise
unremarkable.

Musculoskeletal: No worrisome lytic or sclerotic lesions.
Degenerative changes are seen in the spine.
IMPRESSION: 1. Right renal edema and mild right hydronephrosis secondary to a 6
mm stone in the proximal right ureter.
2. Bilateral renal stones.
3. Aortic atherosclerosis ([id number]). Coronary artery
calcification.

## 2017-03-08 ENCOUNTER — Ambulatory Visit: Payer: Medicare Other | Admitting: Physical Therapy

## 2017-03-29 ENCOUNTER — Telehealth (INDEPENDENT_AMBULATORY_CARE_PROVIDER_SITE_OTHER): Payer: Self-pay | Admitting: Physical Medicine and Rehabilitation

## 2017-03-30 NOTE — Telephone Encounter (Signed)
Scheduled for 04/09/17 at 0830.

## 2017-03-30 NOTE — Telephone Encounter (Signed)
Unless it was tremendously better would do right L4-5 and L5-S1 facet block per my last notes

## 2017-04-09 ENCOUNTER — Encounter (INDEPENDENT_AMBULATORY_CARE_PROVIDER_SITE_OTHER): Payer: Self-pay | Admitting: Physical Medicine and Rehabilitation

## 2017-04-09 ENCOUNTER — Ambulatory Visit (INDEPENDENT_AMBULATORY_CARE_PROVIDER_SITE_OTHER): Payer: Medicare Other | Admitting: Physical Medicine and Rehabilitation

## 2017-04-09 ENCOUNTER — Ambulatory Visit (INDEPENDENT_AMBULATORY_CARE_PROVIDER_SITE_OTHER): Payer: Medicare Other

## 2017-04-09 VITALS — BP 135/64 | HR 60 | Temp 97.7°F

## 2017-04-09 DIAGNOSIS — M47816 Spondylosis without myelopathy or radiculopathy, lumbar region: Secondary | ICD-10-CM

## 2017-04-09 MED ORDER — METHYLPREDNISOLONE ACETATE 80 MG/ML IJ SUSP
80.0000 mg | Freq: Once | INTRAMUSCULAR | Status: AC
  Administered 2017-04-09: 80 mg

## 2017-04-09 NOTE — Progress Notes (Signed)
Lower back pain from mid back to right side.  Increased pain and numbness radiating to right leg, mostly at night.  Taking Aleve, not helping.  Last injection 01/22/17, injection did well for 3 weeks afterwards. Pain has slowly progressed.  No blood thinner, baby asprin. No contrast allergy.  Has driver today.

## 2017-04-09 NOTE — Patient Instructions (Signed)

## 2017-04-10 NOTE — Procedures (Signed)
[name] is a pleasant 84 year old gentleman who unfortunately is a poor historian.  He is here with his wife who does provide some of the history.  Briefly we saw him last year and completed transforaminal epidural steroid injection in an area where he has some collapsing scoliosis and facet arthropathy on the right at L4-5.  At that time he felt like the injection helped but he did have a run in with kidney stones around the same time that was fairly severe so he is really unsure how much it helped.  We went ahead a few weeks ago and repeated the injection which was a right L4 transforaminal injection with what he says was almost 100% relief for 3 weeks.  His wife however disagrees with the fact that it helped that much.  Reviewing his MRI shows mainly facet arthropathy on the right at L4-5 with some foraminal narrowing although there is no frank nerve compression.  He gets most of his pain with twisting and extension and mostly on the right lower back region but it does refer into the leg at times.  His biggest complaint is the right sided low back pain into the buttock region.  I think the best approach is a diagnostic facet joint block on the right at L4-5.  He would likely represent a great candidate for radiofrequency ablation in the future if this seemed to help.  We briefly discussed this.  The injection  will be diagnostic and hopefully therapeutic. The patient has failed conservative care including time, medications and activity modification.  Lumbar Facet Joint Intra-Articular Injection(s) with Fluoroscopic Guidance  Patient: Alejandro Foster LEVELS      Date of Birth: 02/11/1934 MRN: [id number] PCP: Shon Baton, MD      Visit Date: 04/09/2017   Universal Protocol:    Date/Time: 04/09/2017  Consent Given By: the patient  Position: PRONE   Additional Comments: Vital signs were monitored before and after the procedure. Patient was prepped and draped in the usual sterile fashion. The correct  patient, procedure, and site was verified.   Injection Procedure Details:  Procedure Site One Meds Administered:  Meds ordered this encounter  Medications  . methylPREDNISolone acetate (DEPO-MEDROL) injection 80 mg     Laterality: Right  Location/Site:  L4-L5  Needle size: 22 guage  Needle type: Spinal  Needle Placement: Articular  Findings:  -Comments: Excellent flow of contrast producing a partial arthrogram.  Procedure Details: The fluoroscope beam is vertically oriented in AP, and the inferior recess is visualized beneath the lower pole of the inferior apophyseal process, which represents the target point for needle insertion. When direct visualization is difficult the target point is located at the medial projection of the vertebral pedicle. The region overlying each aforementioned target is locally anesthetized with a 1 to 2 ml. volume of 1% Lidocaine without Epinephrine.   The spinal needle was inserted into each of the above mentioned facet joints using biplanar fluoroscopic guidance. A 0.25 to 0.5 ml. volume of Isovue-250 was injected and a partial facet joint arthrogram was obtained. A single spot film was obtained of the resulting arthrogram.    One to 1.25 ml of the steroid/anesthetic solution was then injected into each of the facet joints noted above.   Additional Comments:  The patient tolerated the procedure well Dressing: Band-Aid    Post-procedure details: Patient was observed during the procedure. Post-procedure instructions were reviewed.  Patient left the clinic in stable condition.  Pertinent Imaging: MRI LUMBAR SPINE WITHOUT  CONTRAST  TECHNIQUE: Multiplanar, multisequence MR imaging of the lumbar spine was performed. No intravenous contrast was administered.  COMPARISON:  Office radiographs 07/15/2015.  FINDINGS: Segmentation: Conventional anatomy assumed, with the last open disc space designated L5-S1.  Alignment: There is a moderate  convex right scoliosis centered at L2. There is 2 mm of degenerative anterolisthesis at L4-5.  Vertebrae: No worrisome osseous lesion, acute fracture or pars defect. The visualized sacroiliac joints appear unremarkable.  Conus medullaris: Extends to the L1-2 level and appears normal.  Paraspinal and other soft tissues: No significant paraspinal findings. A left renal cyst is partially imaged.  Disc levels:  T11-12: Mild loss of disc height and annular disc bulging. No spinal stenosis or nerve root encroachment.  There are no significant disc space findings at T12-L1 or L1-2.  L2-3: Mild disc bulging, facet and ligamentous hypertrophy. No spinal stenosis or nerve root encroachment.  L3-4: Annular disc bulging, facet and ligamentous hypertrophy contribute to mild spinal stenosis and mild narrowing of the lateral recesses, left greater than right. The foramina are sufficiently patent.  L4-5: There is loss of disc height with annular disc bulging and endplate osteophytes asymmetric to the right. There is also asymmetric right-sided facet hypertrophy. These factors contribute to mild-to-moderate right foraminal and mild right lateral recess narrowing. There is no definite nerve root encroachment. The spinal canal is adequately patent.  L5-S1: Fairly symmetric disc bulging with asymmetric facet hypertrophy on the right. Mild right foraminal narrowing. No definite nerve root encroachment.  IMPRESSION: 1. Convex right lumbar scoliosis with associated multilevel spondylosis. No acute findings demonstrated. 2. Mild multifactorial spinal stenosis at L3-4 with mild narrowing of the lateral recesses, left greater than right. 3. Mild-to-moderate right foraminal and mild right lateral recess narrowing at L4-5 without definite nerve root encroachment. 4. Mild right foraminal narrowing at L5-S1.   Electronically Signed   By: Richardean Sale M.D.   On: 08/22/2015 15:41

## 2017-04-27 ENCOUNTER — Telehealth (INDEPENDENT_AMBULATORY_CARE_PROVIDER_SITE_OTHER): Payer: Self-pay | Admitting: Physical Medicine and Rehabilitation

## 2017-04-27 NOTE — Telephone Encounter (Signed)
Patient states he had no relief from facet injection. Scheduled for right L4 and L5 TF on 05/14/17 with driver and no blood thinners.

## 2017-04-27 NOTE — Telephone Encounter (Signed)
If no help at all or less than 50% then repeat the injection that was done prior to that last year, think it was two level trans esi.  If facets helped but just did not last long then repeat with OV to discuss RFA - pt not good historian

## 2017-05-14 ENCOUNTER — Ambulatory Visit (INDEPENDENT_AMBULATORY_CARE_PROVIDER_SITE_OTHER): Payer: Medicare Other

## 2017-05-14 ENCOUNTER — Encounter (INDEPENDENT_AMBULATORY_CARE_PROVIDER_SITE_OTHER): Payer: Self-pay | Admitting: Physical Medicine and Rehabilitation

## 2017-05-14 ENCOUNTER — Ambulatory Visit (INDEPENDENT_AMBULATORY_CARE_PROVIDER_SITE_OTHER): Payer: Medicare Other | Admitting: Physical Medicine and Rehabilitation

## 2017-05-14 VITALS — BP 124/63 | HR 61 | Temp 97.8°F

## 2017-05-14 DIAGNOSIS — M48062 Spinal stenosis, lumbar region with neurogenic claudication: Secondary | ICD-10-CM | POA: Diagnosis not present

## 2017-05-14 DIAGNOSIS — M5416 Radiculopathy, lumbar region: Secondary | ICD-10-CM | POA: Diagnosis not present

## 2017-05-14 DIAGNOSIS — M419 Scoliosis, unspecified: Secondary | ICD-10-CM | POA: Diagnosis not present

## 2017-05-14 IMAGING — CR DG ABDOMEN 1V
1 series · 1 of 1 positions shown · non-contrast
Comparison: CT abdomen pelvis of 02/28/2016

CLINICAL DATA: Preop for right ureteral calculus

EXAM:
ABDOMEN - 1 VIEW

[t abdomen supine]
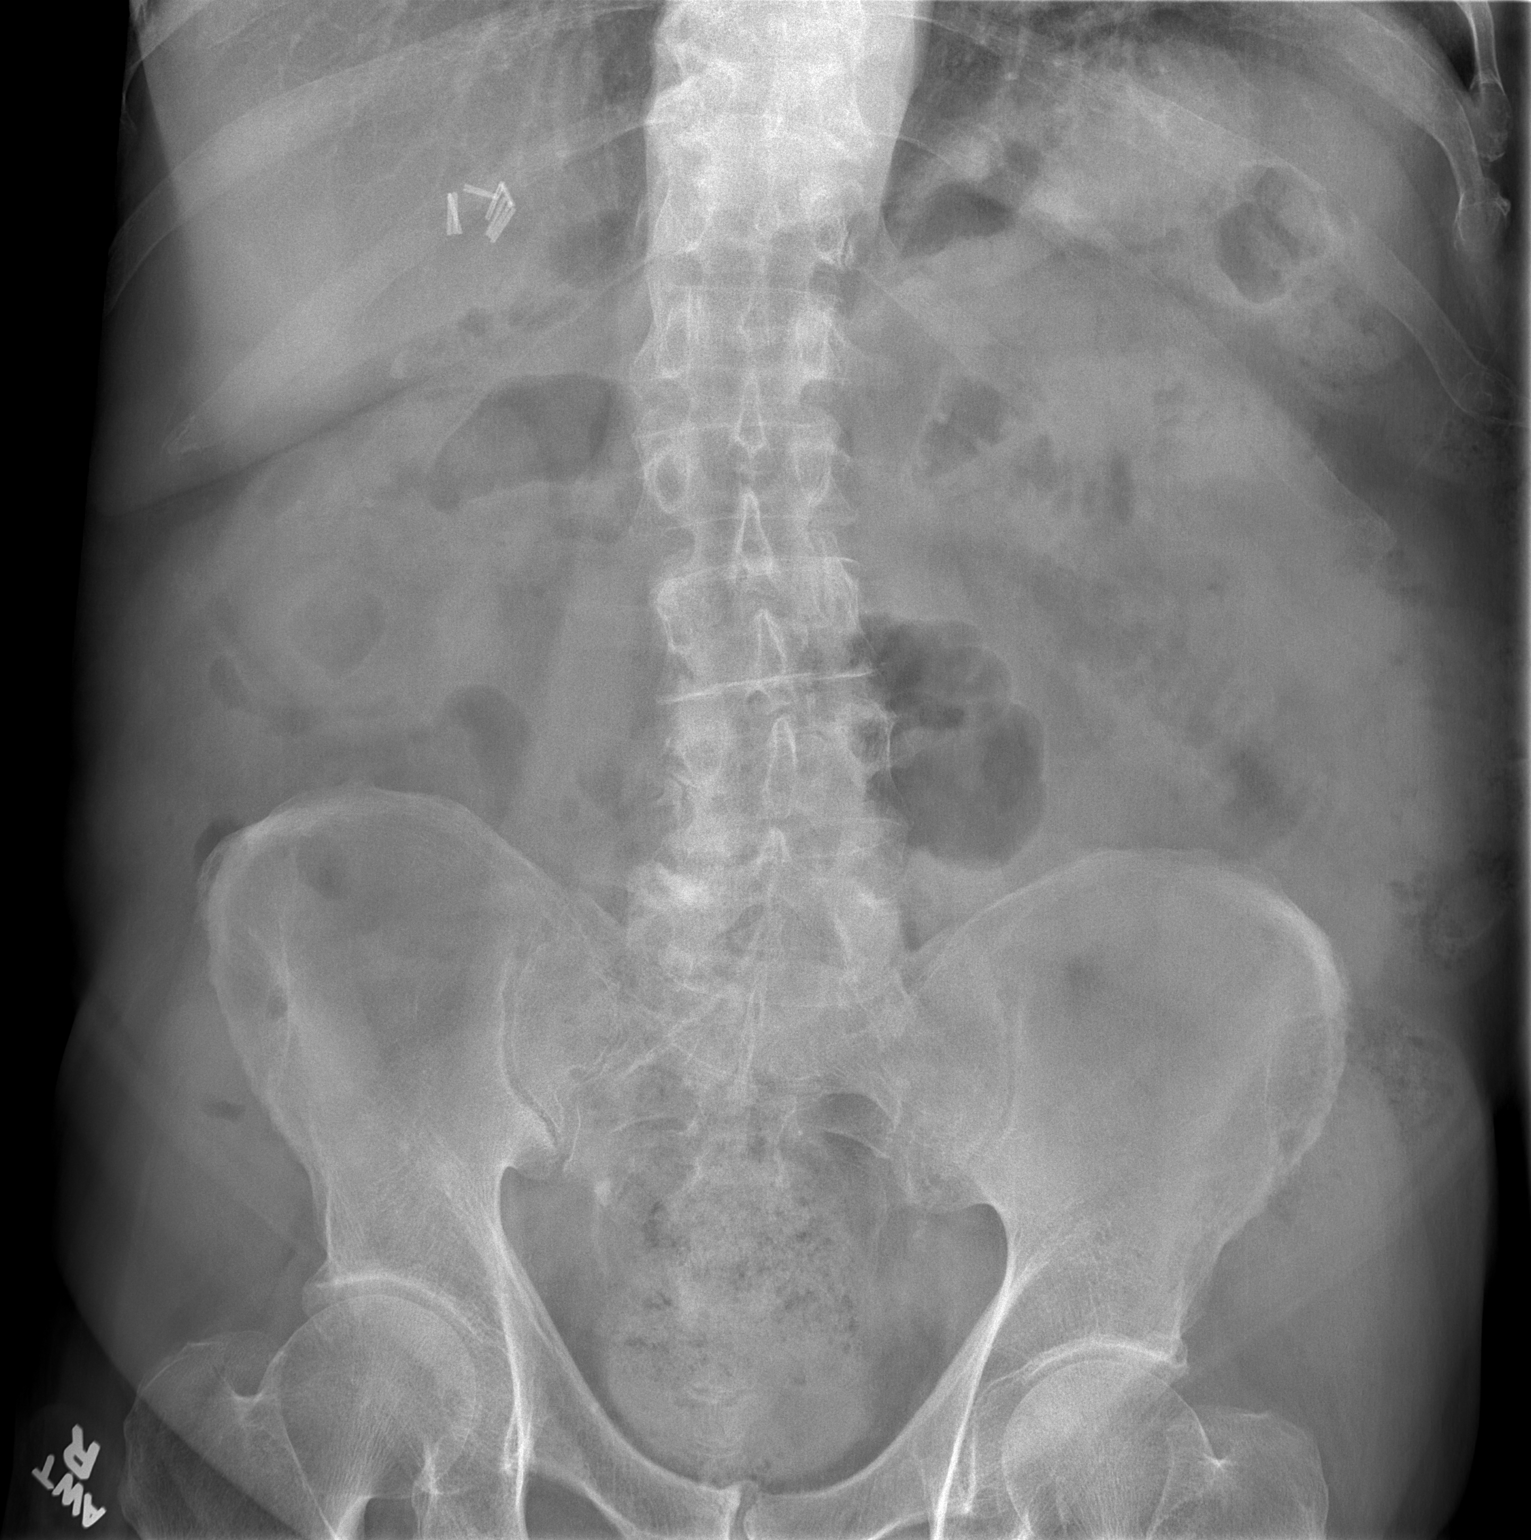

[1 of 1 positions shown; findings below may reference images not displayed]

FINDINGS: The distal right ureteral calculus noted by CT appears unchanged in
position overlying the lower right sacrum. A tiny right mid upper
renal calculus is noted. By plain film no definite left renal
calculi are seen. The bowel gas pattern is nonspecific. Surgical
clips are present in the right upper quadrant from prior
cholecystectomy.
IMPRESSION: 1. No change in position of the previously demonstrated distal right
ureteral calculus overlying the lower right sacrum.
2. Tiny right renal calculus. No definite left renal calculi are
seen by plain film.

## 2017-05-14 MED ORDER — METHYLPREDNISOLONE ACETATE 80 MG/ML IJ SUSP
80.0000 mg | Freq: Once | INTRAMUSCULAR | Status: AC
  Administered 2017-05-14: 80 mg

## 2017-05-14 NOTE — Patient Instructions (Signed)

## 2017-05-14 NOTE — Progress Notes (Deleted)
Pt states a dull aching pain in lower back mostly on the right side, that sometimes radiates through the right leg. Pt states pain started October 2018. Pt states driving and getting in the bed at night makes pain worse, advil makes pain better. Pt states previous injections did not help any. +Driver,-BT, Dye Allergies. Right L5-S1 interlam ? PT on [place] , had neurorehab PT for balance

## 2017-05-14 NOTE — Progress Notes (Signed)
Alejandro Foster - 84 y.o. male MRN [id number]  Date of birth: 1933/05/25  Office Visit Note: Visit Date: 05/14/2017 PCP: Shon Baton, MD Referred by: Shon Baton, MD  Subjective: Chief Complaint  Patient presents with  . Lower Back - Pain  . Right Leg - Pain   HPI: Alejandro Foster is an 84 year old gentleman who comes in today with his wife who provides some of the history.  He continues to have right-sided back and hip pain that does refer in the leg at times.  No paresthesias.  His history is somewhat convoluted and that this is been going on for quite a while and does affect his activities of daily living he also has issues with balance difficulties.  He has been to physical therapy for balance problems at the neuro rehabilitation center.  We completed transforaminal injection in the fall of last year but unfortunately patient had some medical problems after that and was never really sure how much it helped.  We saw him last month and completed facet joint block which again he thinks helped but is really unsure how much or how long it helped.  He continues to have this dull aching pain in the lower back more on the right side.  He did report that it started in October 2018.    ROS Otherwise per HPI.  Assessment & Plan: Visit Diagnoses:  1. Lumbar radiculopathy   2. Scoliosis of thoracolumbar spine, unspecified scoliosis type   3. Spinal stenosis of lumbar region with neurogenic claudication     Plan: Findings:  Chronic persistent right low back and hip pain with MRI findings of mostly facet arthropathy with some lateral recess narrowing but no focal central stenosis or focal nerve compression.  I think the best approach one time is an intralaminar epidural steroid injection.  See how much relief he gets and if it is not very much once again that I do not think injections are really going to help him.  At that point we would try to get and then with physical therapy at [facility].   Otherwise am not sure exactly how much we can help him other than change of medications or regrouping with Dr. Sharol Given from an orthopedic standpoint.  He does not have any groin pain.  Not sure if this is something more of a musculoskeletal complaint that he may in fact have to live with part of the time.    Meds & Orders:  Meds ordered this encounter  Medications  . methylPREDNISolone acetate (DEPO-MEDROL) injection 80 mg    Orders Placed This Encounter  Procedures  . XR C-ARM NO REPORT  . Epidural Steroid injection    Follow-up: Return if symptoms worsen or fail to improve.   Procedures: No procedures performed  Lumbar Epidural Steroid Injection - Interlaminar Approach with Fluoroscopic Guidance  Patient: Alejandro Foster      Date of Birth: 16-Mar-1934 MRN: [id number] PCP: Shon Baton, MD      Visit Date: 05/14/2017   Universal Protocol:     Consent Given By: the patient  Position: PRONE  Additional Comments: Vital signs were monitored before and after the procedure. Patient was prepped and draped in the usual sterile fashion. The correct patient, procedure, and site was verified.   Injection Procedure Details:  Procedure Site One Meds Administered:  Meds ordered this encounter  Medications  . methylPREDNISolone acetate (DEPO-MEDROL) injection 80 mg     Laterality: Right  Location/Site:  L5-S1  Needle size: 20 G  Needle type: Tuohy  Needle Placement: Paramedian epidural  Findings:   -Comments: Excellent flow of contrast into the epidural space.  Procedure Details: Using a paramedian approach from the side mentioned above, the region overlying the inferior lamina was localized under fluoroscopic visualization and the soft tissues overlying this structure were infiltrated with 4 ml. of 1% Lidocaine without Epinephrine. The Tuohy needle was inserted into the epidural space using a paramedian approach.   The epidural space was localized using loss of resistance  along with lateral and bi-planar fluoroscopic views.  After negative aspirate for air, blood, and CSF, a 2 ml. volume of Isovue-250 was injected into the epidural space and the flow of contrast was observed. Radiographs were obtained for documentation purposes.    The injectate was administered into the level noted above.   Additional Comments:  No complications occurred Dressing: Band-Aid    Post-procedure details: Patient was observed during the procedure. Post-procedure instructions were reviewed.  Patient left the clinic in stable condition.   Clinical History: MRI LUMBAR SPINE WITHOUT CONTRAST  TECHNIQUE: Multiplanar, multisequence MR imaging of the lumbar spine was performed. No intravenous contrast was administered.  COMPARISON:  Office radiographs 07/15/2015.  FINDINGS: Segmentation: Conventional anatomy assumed, with the last open disc space designated L5-S1.  Alignment: There is a moderate convex right scoliosis centered at L2. There is 2 mm of degenerative anterolisthesis at L4-5.  Vertebrae: No worrisome osseous lesion, acute fracture or pars defect. The visualized sacroiliac joints appear unremarkable.  Conus medullaris: Extends to the L1-2 level and appears normal.  Paraspinal and other soft tissues: No significant paraspinal findings. A left renal cyst is partially imaged.  Disc levels:  T11-12: Mild loss of disc height and annular disc bulging. No spinal stenosis or nerve root encroachment.  There are no significant disc space findings at T12-L1 or L1-2.  L2-3: Mild disc bulging, facet and ligamentous hypertrophy. No spinal stenosis or nerve root encroachment.  L3-4: Annular disc bulging, facet and ligamentous hypertrophy contribute to mild spinal stenosis and mild narrowing of the lateral recesses, left greater than right. The foramina are sufficiently patent.  L4-5: There is loss of disc height with annular disc bulging  and endplate osteophytes asymmetric to the right. There is also asymmetric right-sided facet hypertrophy. These factors contribute to mild-to-moderate right foraminal and mild right lateral recess narrowing. There is no definite nerve root encroachment. The spinal canal is adequately patent.  L5-S1: Fairly symmetric disc bulging with asymmetric facet hypertrophy on the right. Mild right foraminal narrowing. No definite nerve root encroachment.  IMPRESSION: 1. Convex right lumbar scoliosis with associated multilevel spondylosis. No acute findings demonstrated. 2. Mild multifactorial spinal stenosis at L3-4 with mild narrowing of the lateral recesses, left greater than right. 3. Mild-to-moderate right foraminal and mild right lateral recess narrowing at L4-5 without definite nerve root encroachment. 4. Mild right foraminal narrowing at L5-S1.   Electronically Signed   By: Richardean Sale M.D.   On: 08/22/2015 15:41  He reports that he quit smoking about 28 years ago. His smoking use included cigarettes. He quit after 40.00 years of use. he has never used smokeless tobacco. No results for input(s): HGBA1C, LABURIC in the last 8760 hours.  Objective:  VS:  HT:    WT:   BMI:     BP:124/63  HR:61bpm  TEMP:97.8 F (36.6 C)(Oral)  RESP:95 % Physical Exam  Musculoskeletal:  Patient ambulates without aid but does have some balance difficulties.  He ambulates with a forward flexed lumbar spine and does have pain with extension of the lumbar spine.  He has no groin pain with hip rotation.    Ortho Exam Imaging: No results found.  Past Medical/Family/Surgical/Social History: Medications & Allergies reviewed per EMR There are no active problems to display for this patient.  Past Medical History:  Diagnosis Date  . Coronary artery disease    cardiologist-  dr Wynonia Lawman--  s/p  cardiac cath's w/ angioplasty and stenting x2  . DDD (degenerative disc disease), lumbar   . History of  kidney stones   . Hyperlipidemia   . Hypertension   . Lumbar radiculopathy, right    right leg weakness  . Nephrolithiasis    bilateral non-obstructive per ct 01-19-2016  . Peripheral neuropathy    feet  . Right leg weakness    due to lumbar radiculopathy  . Right ureteral stone   . S/P coronary artery stent placement    2002 x1  and 2003 x1  . Type 2 diabetes mellitus ([place])   . Wears hearing aid    BILATERAL   History reviewed. No pertinent family history. Past Surgical History:  Procedure Laterality Date  . CARDIAC CATHETERIZATION  01/ 2002   dr Wynonia Lawman   mLAD 30%,  CFX OM3 40%,  RCA 30% previous stent site  . CARDIOVASCULAR STRESS TEST  04-15-2009  dr Wynonia Lawman   normal nuclear study w/ no ischemia/  normal LV function and wall motion , ef 77%  . CATARACT EXTRACTION W/ INTRAOCULAR LENS  IMPLANT, BILATERAL  2013  approx.  . COLONOSCOPY WITH PROPOFOL  2014  . CORONARY ANGIOPLASTY  1990  dr Wynonia Lawman   PTCA to OM2  . CORONARY ANGIOPLASTY  10/ 1996  dr Wynonia Lawman   PTCA to RCA  . CORONARY ANGIOPLASTY WITH STENT PLACEMENT  04/ 1996  dr Wynonia Lawman   stenting to RCA  . CYSTOSCOPY WITH URETEROSCOPY AND STENT PLACEMENT Right 03/28/2016   Procedure: CYSTOSCOPY WITH RIGHT  URETEROSCOPY AND STENT PLACEMENT;  Surgeon: Festus Aloe, MD;  Location: [facility];  Service: Urology;  Laterality: Right;  . EXTRACORPOREAL SHOCK WAVE LITHOTRIPSY  yrs ago  . HOLMIUM LASER APPLICATION Right 09/21/9447   Procedure: HOLMIUM LASER APPLICATION;  Surgeon: Festus Aloe, MD;  Location: [facility];  Service: Urology;  Laterality: Right;  . LAPAROSCOPIC CHOLECYSTECTOMY  1990's  . TONSILLECTOMY AND ADENOIDECTOMY  child  . URETEROLITHOTOMY  1990's   Social History   Occupational History  . Not on file  Tobacco Use  . Smoking status: Former Smoker    Years: 40.00    Types: Cigarettes    Last attempt to quit: 08/26/1988    Years since quitting: 28.7  . Smokeless tobacco: Never  Used  Substance and Sexual Activity  . Alcohol use: Yes    Comment: rare  . Drug use: No  . Sexual activity: Not on file

## 2017-05-22 NOTE — Procedures (Signed)
Lumbar Epidural Steroid Injection - Interlaminar Approach with Fluoroscopic Guidance  Patient: Alejandro Foster      Date of Birth: 07-18-1933 MRN: [id number] PCP: Shon Baton, MD      Visit Date: 05/14/2017   Universal Protocol:     Consent Given By: the patient  Position: PRONE  Additional Comments: Vital signs were monitored before and after the procedure. Patient was prepped and draped in the usual sterile fashion. The correct patient, procedure, and site was verified.   Injection Procedure Details:  Procedure Site One Meds Administered:  Meds ordered this encounter  Medications  . methylPREDNISolone acetate (DEPO-MEDROL) injection 80 mg     Laterality: Right  Location/Site:  L5-S1  Needle size: 20 G  Needle type: Tuohy  Needle Placement: Paramedian epidural  Findings:   -Comments: Excellent flow of contrast into the epidural space.  Procedure Details: Using a paramedian approach from the side mentioned above, the region overlying the inferior lamina was localized under fluoroscopic visualization and the soft tissues overlying this structure were infiltrated with 4 ml. of 1% Lidocaine without Epinephrine. The Tuohy needle was inserted into the epidural space using a paramedian approach.   The epidural space was localized using loss of resistance along with lateral and bi-planar fluoroscopic views.  After negative aspirate for air, blood, and CSF, a 2 ml. volume of Isovue-250 was injected into the epidural space and the flow of contrast was observed. Radiographs were obtained for documentation purposes.    The injectate was administered into the level noted above.   Additional Comments:  No complications occurred Dressing: Band-Aid    Post-procedure details: Patient was observed during the procedure. Post-procedure instructions were reviewed.  Patient left the clinic in stable condition.

## 2017-08-01 DIAGNOSIS — R600 Localized edema: Secondary | ICD-10-CM | POA: Insufficient documentation

## 2017-09-17 DIAGNOSIS — R454 Irritability and anger: Secondary | ICD-10-CM | POA: Insufficient documentation

## 2017-09-17 DIAGNOSIS — R059 Cough, unspecified: Secondary | ICD-10-CM | POA: Insufficient documentation

## 2017-09-26 ENCOUNTER — Telehealth (INDEPENDENT_AMBULATORY_CARE_PROVIDER_SITE_OTHER): Payer: Self-pay | Admitting: Physical Medicine and Rehabilitation

## 2017-09-26 NOTE — Telephone Encounter (Signed)
Yes if really helped

## 2017-09-27 NOTE — Telephone Encounter (Signed)
Patient reports that he had almost no pain until recently. Scheduled for 10/16/17 at 1400 with driver and no blood thinners.

## 2017-10-16 ENCOUNTER — Ambulatory Visit (INDEPENDENT_AMBULATORY_CARE_PROVIDER_SITE_OTHER): Payer: Self-pay

## 2017-10-16 ENCOUNTER — Ambulatory Visit (INDEPENDENT_AMBULATORY_CARE_PROVIDER_SITE_OTHER): Payer: Medicare Other | Admitting: Physical Medicine and Rehabilitation

## 2017-10-16 VITALS — BP 133/61 | HR 66 | Temp 98.3°F

## 2017-10-16 DIAGNOSIS — M5416 Radiculopathy, lumbar region: Secondary | ICD-10-CM | POA: Diagnosis not present

## 2017-10-16 DIAGNOSIS — M419 Scoliosis, unspecified: Secondary | ICD-10-CM | POA: Diagnosis not present

## 2017-10-16 DIAGNOSIS — M48062 Spinal stenosis, lumbar region with neurogenic claudication: Secondary | ICD-10-CM

## 2017-10-16 MED ORDER — METHYLPREDNISOLONE ACETATE 80 MG/ML IJ SUSP
80.0000 mg | Freq: Once | INTRAMUSCULAR | Status: AC
  Administered 2017-10-16: 80 mg

## 2017-10-16 NOTE — Patient Instructions (Signed)

## 2017-10-16 NOTE — Progress Notes (Signed)
    Numeric Pain Rating Scale and Functional Assessment Average Pain 2   In the last MONTH (on 0-10 scale) has pain interfered with the following?  1. General activity like being  able to carry out your everyday physical activities such as walking, climbing stairs, carrying groceries, or moving a chair?  Rating(2)   +Driver, -BT, -Dye Allergies.  

## 2017-10-16 NOTE — Procedures (Signed)
Lumbar Epidural Steroid Injection - Interlaminar Approach with Fluoroscopic Guidance  Patient: Alejandro Foster      Date of Birth: [birth year] MRN: [id number] PCP: Shon Baton, MD      Visit Date: 10/16/2017   Universal Protocol:     Consent Given By: the patient  Position: PRONE  Additional Comments: Vital signs were monitored before and after the procedure. Patient was prepped and draped in the usual sterile fashion. The correct patient, procedure, and site was verified.   Injection Procedure Details:  Procedure Site One Meds Administered:  Meds ordered this encounter  Medications  . methylPREDNISolone acetate (DEPO-MEDROL) injection 80 mg     Laterality: Right  Location/Site:  L5-S1  Needle size: 20 G  Needle type: Tuohy  Needle Placement: Paramedian epidural  Findings:   -Comments: Excellent flow of contrast into the epidural space.  Procedure Details: Using a paramedian approach from the side mentioned above, the region overlying the inferior lamina was localized under fluoroscopic visualization and the soft tissues overlying this structure were infiltrated with 4 ml. of 1% Lidocaine without Epinephrine. The Tuohy needle was inserted into the epidural space using a paramedian approach.   The epidural space was localized using loss of resistance along with lateral and bi-planar fluoroscopic views.  After negative aspirate for air, blood, and CSF, a 2 ml. volume of Isovue-250 was injected into the epidural space and the flow of contrast was observed. Radiographs were obtained for documentation purposes.    The injectate was administered into the level noted above.   Additional Comments:  The patient tolerated the procedure well Dressing: Band-Aid    Post-procedure details: Patient was observed during the procedure. Post-procedure instructions were reviewed.  Patient left the clinic in stable condition.

## 2017-10-23 NOTE — Progress Notes (Signed)
Alejandro RADELL - 84 y.o. male MRN [id number]  Date of birth: 1934-02-25  Office Visit Note: Visit Date: 10/16/2017 PCP: Shon Baton, MD Referred by: Shon Baton, MD  Subjective: Chief Complaint  Patient presents with  . Lower Back - Pain   HPI: [name] is an 84 year old gentleman with chronic history of back and hip and leg pain.  Most of his pain is from the lower back right more than the left but he does get pain a little bit higher up in the lower thoracic upper lumbar region.  This goes from sitting to laying down to getting up and movement.  He reports the last injection which was in February of this year and was a L5-S1 interlaminar injection gave him great relief for very similar symptoms.  Again he hurts more twisting sideways.  He has a good bit of scoliosis with multilevel facet arthropathy but not much in the way of central canal stenosis.  MRI was from 2017.  He said no new injuries.  We are going to repeat the injection today as a simplest thing to do.  If he does well with that it seems to help him I think that is fine with doing these very intermittently.  If it does not seem to help as much with the mechanical complaints are probably have him regroup with a physical therapist for a short course.   ROS Otherwise per HPI.  Assessment & Plan: Visit Diagnoses:  1. Lumbar radiculopathy   2. Scoliosis of thoracolumbar spine, unspecified scoliosis type   3. Spinal stenosis of lumbar region with neurogenic claudication     Plan: No additional findings.   Meds & Orders:  Meds ordered this encounter  Medications  . methylPREDNISolone acetate (DEPO-MEDROL) injection 80 mg    Orders Placed This Encounter  Procedures  . XR C-ARM NO REPORT  . Epidural Steroid injection    Follow-up: Return if symptoms worsen or fail to improve.   Procedures: No procedures performed  Lumbar Epidural Steroid Injection - Interlaminar Approach with Fluoroscopic Guidance  Patient: Alejandro Foster      Date of Birth: 05-10-1933 MRN: [id number] PCP: Shon Baton, MD      Visit Date: 10/16/2017   Universal Protocol:     Consent Given By: the patient  Position: PRONE  Additional Comments: Vital signs were monitored before and after the procedure. Patient was prepped and draped in the usual sterile fashion. The correct patient, procedure, and site was verified.   Injection Procedure Details:  Procedure Site One Meds Administered:  Meds ordered this encounter  Medications  . methylPREDNISolone acetate (DEPO-MEDROL) injection 80 mg     Laterality: Right  Location/Site:  L5-S1  Needle size: 20 G  Needle type: Tuohy  Needle Placement: Paramedian epidural  Findings:   -Comments: Excellent flow of contrast into the epidural space.  Procedure Details: Using a paramedian approach from the side mentioned above, the region overlying the inferior lamina was localized under fluoroscopic visualization and the soft tissues overlying this structure were infiltrated with 4 ml. of 1% Lidocaine without Epinephrine. The Tuohy needle was inserted into the epidural space using a paramedian approach.   The epidural space was localized using loss of resistance along with lateral and bi-planar fluoroscopic views.  After negative aspirate for air, blood, and CSF, a 2 ml. volume of Isovue-250 was injected into the epidural space and the flow of contrast was observed. Radiographs were obtained for documentation purposes.  The injectate was administered into the level noted above.   Additional Comments:  The patient tolerated the procedure well Dressing: Band-Aid    Post-procedure details: Patient was observed during the procedure. Post-procedure instructions were reviewed.  Patient left the clinic in stable condition.   Clinical History: MRI LUMBAR SPINE WITHOUT CONTRAST  TECHNIQUE: Multiplanar, multisequence MR imaging of the lumbar spine was performed. No intravenous  contrast was administered.  COMPARISON:  Office radiographs 07/15/2015.  FINDINGS: Segmentation: Conventional anatomy assumed, with the last open disc space designated L5-S1.  Alignment: There is a moderate convex right scoliosis centered at L2. There is 2 mm of degenerative anterolisthesis at L4-5.  Vertebrae: No worrisome osseous lesion, acute fracture or pars defect. The visualized sacroiliac joints appear unremarkable.  Conus medullaris: Extends to the L1-2 level and appears normal.  Paraspinal and other soft tissues: No significant paraspinal findings. A left renal cyst is partially imaged.  Disc levels:  T11-12: Mild loss of disc height and annular disc bulging. No spinal stenosis or nerve root encroachment.  There are no significant disc space findings at T12-L1 or L1-2.  L2-3: Mild disc bulging, facet and ligamentous hypertrophy. No spinal stenosis or nerve root encroachment.  L3-4: Annular disc bulging, facet and ligamentous hypertrophy contribute to mild spinal stenosis and mild narrowing of the lateral recesses, left greater than right. The foramina are sufficiently patent.  L4-5: There is loss of disc height with annular disc bulging and endplate osteophytes asymmetric to the right. There is also asymmetric right-sided facet hypertrophy. These factors contribute to mild-to-moderate right foraminal and mild right lateral recess narrowing. There is no definite nerve root encroachment. The spinal canal is adequately patent.  L5-S1: Fairly symmetric disc bulging with asymmetric facet hypertrophy on the right. Mild right foraminal narrowing. No definite nerve root encroachment.  IMPRESSION: 1. Convex right lumbar scoliosis with associated multilevel spondylosis. No acute findings demonstrated. 2. Mild multifactorial spinal stenosis at L3-4 with mild narrowing of the lateral recesses, left greater than right. 3. Mild-to-moderate right foraminal  and mild right lateral recess narrowing at L4-5 without definite nerve root encroachment. 4. Mild right foraminal narrowing at L5-S1.   Electronically Signed   By: Richardean Sale M.D.   On: 08/22/2015 15:41   He reports that he quit smoking about 29 years ago. His smoking use included cigarettes. He quit after 40.00 years of use. He has never used smokeless tobacco. No results for input(s): HGBA1C, LABURIC in the last 8760 hours.  Objective:  VS:  HT:    WT:   BMI:     BP:133/61  HR:66bpm  TEMP:98.3 F (36.8 C)(Oral)  RESP:94 % Physical Exam  Ortho Exam Imaging: No results found.  Past Medical/Family/Surgical/Social History: Medications & Allergies reviewed per EMR, new medications updated. There are no active problems to display for this patient.  Past Medical History:  Diagnosis Date  . Coronary artery disease    cardiologist-  dr Wynonia Lawman--  s/p  cardiac cath's w/ angioplasty and stenting x2  . DDD (degenerative disc disease), lumbar   . History of kidney stones   . Hyperlipidemia   . Hypertension   . Lumbar radiculopathy, right    right leg weakness  . Nephrolithiasis    bilateral non-obstructive per ct 01-19-2016  . Peripheral neuropathy    feet  . Right leg weakness    due to lumbar radiculopathy  . Right ureteral stone   . S/P coronary artery stent placement    2002 x1  and  2003 x1  . Type 2 diabetes mellitus ([place])   . Wears hearing aid    BILATERAL   No family history on file. Past Surgical History:  Procedure Laterality Date  . CARDIAC CATHETERIZATION  01/ 2002   dr Wynonia Lawman   mLAD 30%,  CFX OM3 40%,  RCA 30% previous stent site  . CARDIOVASCULAR STRESS TEST  04-15-2009  dr Wynonia Lawman   normal nuclear study w/ no ischemia/  normal LV function and wall motion , ef 77%  . CATARACT EXTRACTION W/ INTRAOCULAR LENS  IMPLANT, BILATERAL  2013  approx.  . COLONOSCOPY WITH PROPOFOL  2014  . CORONARY ANGIOPLASTY  1990  dr Wynonia Lawman   PTCA to OM2  . CORONARY  ANGIOPLASTY  10/ 1996  dr Wynonia Lawman   PTCA to RCA  . CORONARY ANGIOPLASTY WITH STENT PLACEMENT  04/ 1996  dr Wynonia Lawman   stenting to RCA  . CYSTOSCOPY WITH URETEROSCOPY AND STENT PLACEMENT Right 03/28/2016   Procedure: CYSTOSCOPY WITH RIGHT  URETEROSCOPY AND STENT PLACEMENT;  Surgeon: Festus Aloe, MD;  Location[facility];  Service: Urology;  Laterality: Right;  . EXTRACORPOREAL SHOCK WAVE LITHOTRIPSY  yrs ago  . HOLMIUM LASER APPLICATION Right 5/0/5397   Procedure: HOLMIUM LASER APPLICATION;  Surgeon: Festus Aloe, MD;  Location: [facility];  Service: Urology;  Laterality: Right;  . LAPAROSCOPIC CHOLECYSTECTOMY  1990's  . TONSILLECTOMY AND ADENOIDECTOMY  child  . URETEROLITHOTOMY  1990's   Social History   Occupational History  . Not on file  Tobacco Use  . Smoking status: Former Smoker    Years: 40.00    Types: Cigarettes    Last attempt to quit: 08/26/1988    Years since quitting: 29.1  . Smokeless tobacco: Never Used  Substance and Sexual Activity  . Alcohol use: Yes    Comment: rare  . Drug use: No  . Sexual activity: Not on file

## 2017-11-05 ENCOUNTER — Telehealth (INDEPENDENT_AMBULATORY_CARE_PROVIDER_SITE_OTHER): Payer: Self-pay | Admitting: Physical Medicine and Rehabilitation

## 2017-11-05 DIAGNOSIS — M48062 Spinal stenosis, lumbar region with neurogenic claudication: Secondary | ICD-10-CM

## 2017-11-05 DIAGNOSIS — M5416 Radiculopathy, lumbar region: Secondary | ICD-10-CM

## 2017-11-05 NOTE — Telephone Encounter (Signed)
Patient wishes to proceed with MRI. Order placed.

## 2017-11-05 NOTE — Telephone Encounter (Signed)
Need to either update MRI of lspine or f/up with Dr. Sharol Given from an orthopedic standpoint, last MRI was 2017 and pain is now worse despite injection and PT.

## 2017-11-14 ENCOUNTER — Ambulatory Visit
Admission: RE | Admit: 2017-11-14 | Discharge: 2017-11-14 | Disposition: A | Discharge status: Home / Self Care | Payer: Medicare Other | Source: Ambulatory Visit | Attending: Physical Medicine and Rehabilitation | Admitting: Physical Medicine and Rehabilitation

## 2017-11-14 DIAGNOSIS — M5416 Radiculopathy, lumbar region: Secondary | ICD-10-CM

## 2017-11-14 DIAGNOSIS — M48062 Spinal stenosis, lumbar region with neurogenic claudication: Secondary | ICD-10-CM

## 2017-11-15 ENCOUNTER — Telehealth (INDEPENDENT_AMBULATORY_CARE_PROVIDER_SITE_OTHER): Payer: Self-pay | Admitting: Orthopedic Surgery

## 2017-11-15 ENCOUNTER — Telehealth (INDEPENDENT_AMBULATORY_CARE_PROVIDER_SITE_OTHER): Payer: Self-pay | Admitting: Physical Medicine and Rehabilitation

## 2017-11-15 NOTE — Telephone Encounter (Signed)
Please see message below concerning a call for MRI results.  Thank You

## 2017-11-15 NOTE — Telephone Encounter (Signed)
Call patient.  His MRI scan shows degenerative disc disease which is unchanged from the MRI scan of 2017.  Would follow-up with Dr. Ernestina Patches.

## 2017-11-15 NOTE — Telephone Encounter (Signed)
Patient had an MRI yesterday and states he is now out of town. He would like to see if Dr. Sharol Given would call him with results. Patients # [phone]

## 2017-11-15 NOTE — Telephone Encounter (Signed)
Called and left a VM advising patient of Dr. Jess Barters message concerning his MRI results.

## 2017-11-15 NOTE — Telephone Encounter (Signed)
Per [facility] website this [facility] Advantage members plan does not currently require a prior authorization for procedure [phone].

## 2017-11-29 ENCOUNTER — Ambulatory Visit (INDEPENDENT_AMBULATORY_CARE_PROVIDER_SITE_OTHER): Payer: Medicare Other | Admitting: Physical Medicine and Rehabilitation

## 2017-11-29 ENCOUNTER — Encounter (INDEPENDENT_AMBULATORY_CARE_PROVIDER_SITE_OTHER): Payer: Self-pay | Admitting: Physical Medicine and Rehabilitation

## 2017-11-29 ENCOUNTER — Ambulatory Visit (INDEPENDENT_AMBULATORY_CARE_PROVIDER_SITE_OTHER): Payer: Self-pay

## 2017-11-29 VITALS — BP 127/59 | HR 59 | Temp 98.5°F

## 2017-11-29 DIAGNOSIS — M7918 Myalgia, other site: Secondary | ICD-10-CM

## 2017-11-29 DIAGNOSIS — E1142 Type 2 diabetes mellitus with diabetic polyneuropathy: Secondary | ICD-10-CM

## 2017-11-29 DIAGNOSIS — M47816 Spondylosis without myelopathy or radiculopathy, lumbar region: Secondary | ICD-10-CM

## 2017-11-29 DIAGNOSIS — M9983 Other biomechanical lesions of lumbar region: Secondary | ICD-10-CM | POA: Diagnosis not present

## 2017-11-29 DIAGNOSIS — G8929 Other chronic pain: Secondary | ICD-10-CM

## 2017-11-29 DIAGNOSIS — R531 Weakness: Secondary | ICD-10-CM

## 2017-11-29 DIAGNOSIS — M546 Pain in thoracic spine: Secondary | ICD-10-CM | POA: Diagnosis not present

## 2017-11-29 DIAGNOSIS — M48061 Spinal stenosis, lumbar region without neurogenic claudication: Secondary | ICD-10-CM

## 2017-11-29 MED ORDER — BETAMETHASONE SOD PHOS & ACET 6 (3-3) MG/ML IJ SUSP: 12.0000 mg | Freq: Once | INTRAMUSCULAR | Status: DC

## 2017-11-29 NOTE — Progress Notes (Signed)
 .  Numeric Pain Rating Scale and Functional Assessment Average Pain 8   In the last MONTH (on 0-10 scale) has pain interfered with the following?  1. General activity like being  able to carry out your everyday physical activities such as walking, climbing stairs, carrying groceries, or moving a chair?  Rating(3)   +Driver, -BT, -Dye Allergies.  

## 2017-12-05 ENCOUNTER — Other Ambulatory Visit: Payer: Self-pay

## 2017-12-05 ENCOUNTER — Ambulatory Visit: Payer: Medicare Other | Attending: Physical Medicine and Rehabilitation | Admitting: Physical Therapy

## 2017-12-05 ENCOUNTER — Encounter: Payer: Self-pay | Admitting: Physical Therapy

## 2017-12-05 DIAGNOSIS — R2689 Other abnormalities of gait and mobility: Secondary | ICD-10-CM | POA: Diagnosis present

## 2017-12-05 DIAGNOSIS — M6281 Muscle weakness (generalized): Secondary | ICD-10-CM | POA: Diagnosis present

## 2017-12-05 DIAGNOSIS — G8929 Other chronic pain: Secondary | ICD-10-CM | POA: Diagnosis present

## 2017-12-05 DIAGNOSIS — R293 Abnormal posture: Secondary | ICD-10-CM | POA: Insufficient documentation

## 2017-12-05 DIAGNOSIS — M545 Low back pain, unspecified: Secondary | ICD-10-CM

## 2017-12-05 DIAGNOSIS — R2681 Unsteadiness on feet: Secondary | ICD-10-CM | POA: Diagnosis present

## 2017-12-05 NOTE — Therapy (Signed)
[facility] [address] [place], [place], [address] Phone: [phone]   Fax:  [phone]  Physical Therapy Evaluation  Patient Details  Name: Alejandro Foster MRN: [id number] Date of Birth: May 06, 1933 Referring Provider: Magnus Sinning, MD   Encounter Date: 12/05/2017  PT End of Session - 12/05/17 1708    Visit Number  1    Date for PT Re-Evaluation  01/30/18    Authorization Type  [employer] Medicare    PT Start Time  1005    PT Stop Time  1053    PT Time Calculation (min)  48 min    Activity Tolerance  Patient tolerated treatment well    Behavior During Therapy  [facility] for tasks assessed/performed       Past Medical History:  Diagnosis Date  . Coronary artery disease    cardiologist-  dr Wynonia Lawman--  s/p  cardiac cath's w/ angioplasty and stenting x2  . DDD (degenerative disc disease), lumbar   . History of kidney stones   . Hyperlipidemia   . Hypertension   . Lumbar radiculopathy, right    right leg weakness  . Nephrolithiasis    bilateral non-obstructive per ct 01-19-2016  . Peripheral neuropathy    feet  . Right leg weakness    due to lumbar radiculopathy  . Right ureteral stone   . S/P coronary artery stent placement    2002 x1  and 2003 x1  . Type 2 diabetes mellitus ([place])   . Wears hearing aid    BILATERAL    Past Surgical History:  Procedure Laterality Date  . CARDIAC CATHETERIZATION  01/ 2002   dr Wynonia Lawman   mLAD 30%,  CFX OM3 40%,  RCA 30% previous stent site  . CARDIOVASCULAR STRESS TEST  04-15-2009  dr Wynonia Lawman   normal nuclear study w/ no ischemia/  normal LV function and wall motion , ef 77%  . CATARACT EXTRACTION W/ INTRAOCULAR LENS  IMPLANT, BILATERAL  2013  approx.  . COLONOSCOPY WITH PROPOFOL  2014  . CORONARY ANGIOPLASTY  1990  dr Wynonia Lawman   PTCA to OM2  . CORONARY ANGIOPLASTY  10/ 1996  dr Wynonia Lawman   PTCA to RCA  . CORONARY ANGIOPLASTY WITH STENT PLACEMENT  04/ 1996  dr Wynonia Lawman   stenting to  RCA  . CYSTOSCOPY WITH URETEROSCOPY AND STENT PLACEMENT Right 03/28/2016   Procedure: CYSTOSCOPY WITH RIGHT  URETEROSCOPY AND STENT PLACEMENT;  Surgeon: Festus Aloe, MD;  Location: [facility];  Service: Urology;  Laterality: Right;  . EXTRACORPOREAL SHOCK WAVE LITHOTRIPSY  yrs ago  . HOLMIUM LASER APPLICATION Right 09/22/1605   Procedure: HOLMIUM LASER APPLICATION;  Surgeon: Festus Aloe, MD;  Location: [facility];  Service: Urology;  Laterality: Right;  . LAPAROSCOPIC CHOLECYSTECTOMY  1990's  . TONSILLECTOMY AND ADENOIDECTOMY  child  . URETEROLITHOTOMY  1990's    There were no vitals filed for this visit.   Subjective Assessment - 12/05/17 1011    Subjective  Pt reports Rt sided LBP without any leg pain for approximately 2 years.  Pt has received 4 steriod injections which have helped somewhat.      Limitations  Sitting;Other (comment)   twisting   How long can you sit comfortably?  1 hour    How long can you stand comfortably?  most comfortable position    How long can you walk comfortably?  2 hours    Diagnostic tests  MRI lumbar spine Mod chronic  right L4 nerve level foraminal stenosis at L4/5. Mild multifactorial spinal stenosis at L3/4 with up to moderate Lt lateral recess stenosis at L4 nerve    Patient Stated Goals  put shoes on, exercise routine for weight loss (belly), keep pain to stay calm    Currently in Pain?  Yes    Pain Score  1    can get to 9/10 when twisting and initially laying down and getting up   Pain Location  Back    Pain Orientation  Right    Pain Descriptors / Indicators  Sharp;Aching    Pain Type  Chronic pain    Pain Onset  More than a month ago    Pain Frequency  Intermittent    Aggravating Factors   twisting, sitting > 60 min, slouched sitting, transitioning into laying down and getting up    Pain Relieving Factors  Aleve 3x/day, using caution when getting up out of bed    Effect of Pain on Daily Activities  getting  in and out of bed         [facility] PT Assessment - 12/05/17 0001      Assessment   Medical Diagnosis  M54.6,G89.29 (ICD-10-CM) - Chronic bilateral thoracic back pain    Referring Provider  Magnus Sinning, MD    Next MD Visit  not scheduled    Prior Therapy  balance PT      Balance Screen   Has the patient fallen in the past 6 months  No    Has the patient had a decrease in activity level because of a fear of falling?   No    Is the patient reluctant to leave their home because of a fear of falling?   No      Home Film/video editor residence    Living Arrangements  Spouse/significant other    Home Access  Stairs to enter    Entrance Stairs-Number of Steps  5    Entrance Stairs-Rails  Left;Right;Cannot reach both    [place] bars - tub/shower      Prior Function   Level of [place]  Retired      Associate Professor   Overall Cognitive Status  Within Functional Limits for tasks assessed      Observation/Other Assessments   Focus on Therapeutic Outcomes (FOTO)   41   expected 38% limited     Posture/Postural Control   Posture/Postural Control  Postural limitations    Postural Limitations  Increased thoracic kyphosis;Decreased lumbar lordosis;Forward head;Rounded Shoulders      AROM   Right/Left Hip  Right;Left   Bil hip IR/ER limited by 30%   Lumbar Flexion  10    Lumbar Extension  3    Lumbar - Right Side Bend  5    Lumbar - Left Side Bend  5    Lumbar - Right Rotation  0    Lumbar - Left Rotation  0      Strength   Right/Left Hip  Right;Left    Right Hip Flexion  5/5    Right Hip Extension  4/5    Right Hip External Rotation   4/5    Right Hip Internal Rotation  5/5    Right Hip ABduction  4/5    Right Hip ADduction  5/5    Left Hip Flexion  4+/5    Left Hip Extension  4/5    Left Hip External Rotation  4/5    Left Hip Internal Rotation  5/5    Left Hip ABduction  4/5     Left Hip ADduction  5/5      Flexibility   Soft Tissue Assessment /Muscle Length  yes    Hamstrings  limited bil    Quadriceps  limited bil    Piriformis  limited bil      Palpation   Spinal mobility  Limited PAs throughout thoracic and lumbar spine, limited rib springing bilaterally, limited sacral rocking bilaterally                Objective measurements completed on examination: See above findings.              PT Education - 12/05/17 1706    Education Details  Bed mobility, core stabilization    Person(s) Educated  Patient    Methods  Explanation;Demonstration;Tactile cues    Comprehension  Verbalized understanding;Returned demonstration       PT Short Term Goals - 12/05/17 2003      PT SHORT TERM GOAL #1   Title  Pt will demonstrate proper body mechanics during bed mobility for reduced pain and improved safety getting in/out of bed.    Time  2    Period  Weeks    Status  New    Target Date  12/19/17      PT SHORT TERM GOAL #2   Title  Pt will be independent in initial HEP including spinal ROM, core stabilization, LE and trunk stretches.    Time  4    Period  Weeks    Status  New    Target Date  01/02/18      PT SHORT TERM GOAL #3   Title  Pt will report 50% reduction in painful episodes during position transitions.    Time  4    Period  Weeks    Status  New    Target Date  01/02/18      PT SHORT TERM GOAL #4   Title  Pt will incorporate walking for exercise > or = 20 minutes 3x/week for aerobic health and to contribute to his goal of weight loss.    Time  4    Period  Weeks    Status  New    Target Date  01/02/18        PT Long Term Goals - 12/05/17 2005      PT LONG TERM GOAL #1   Title  Pt will be independent in HEP to include spine ROM, core and LE strength, flexibility and aerobic exercise to promote reduced pain, weight loss and aerobic health.    Time  8    Period  Weeks    Status  New    Target Date  01/30/18      PT  LONG TERM GOAL #2   Title  Pt will gain functional trunk and hip ROM to enable him to put his shoes on without difficulty.    Time  8    Period  Weeks    Status  New    Target Date  01/30/18      PT LONG TERM GOAL #3   Title  Pt will receive a 5/5 MMT rating in all LE muscle groups to promote continued independence in daily activities.    Time  8    Period  Weeks    Status  New  Target Date  01/30/18      PT LONG TERM GOAL #4   Title  Pt will receive a FOTO score of < or = 38% limited.    Time  8    Period  Weeks    Status  New    Target Date  01/30/18      PT LONG TERM GOAL #5   Title  Pt will be able to sit > 1 hour with pain rating of < or = 3/10.    Time  8    Period  Weeks    Status  New    Target Date  01/30/18             Plan - 12/05/17 1711    Clinical Impression Statement  Pt presents with core weakness and significant spinal and pelvic stiffness throughout thoracolumbar regions.  He has some weakness in LE hip musculature and limited flexibility of trunk and LEs.  He will benefit from Pt education for proper mechanics during daily activities, manual therapy, core stabilization and ther ex program to address ROM and strength deficits, and an aerobic exercise regimen.    Clinical Presentation  Stable    Clinical Decision Making  Low    Rehab Potential  Good    PT Frequency  2x / week    PT Duration  8 weeks    PT Treatment/Interventions  ADLs/Self Care Home Management;Traction;Moist Heat;Gait training;Stair training;Functional mobility training;Therapeutic activities;Therapeutic exercise;Patient/family education;Manual techniques;Passive range of motion;Dry needling;Taping    PT Next Visit Plan  begin HEP including follow up on bed mobility, core contractions, add spinal ROM and continue manual therapy techniques/joint mobs for spinal stiffness    Consulted and Agree with Plan of Care  Patient       Patient will benefit from skilled therapeutic intervention  in order to improve the following deficits and impairments:  Improper body mechanics, Pain, Decreased mobility, Postural dysfunction, Decreased activity tolerance, Decreased endurance, Decreased range of motion, Decreased strength, Hypomobility, Impaired flexibility  Visit Diagnosis: Chronic right-sided low back pain without sciatica - Plan: PT plan of care cert/re-cert  Abnormal posture - Plan: PT plan of care cert/re-cert  Muscle weakness (generalized) - Plan: PT plan of care cert/re-cert     Problem List There are no active problems to display for this patient.  Venetia Night Beuhring, PT 12/05/17 8:36 PM   [facility] [address] [place], [place], [address] Phone: [phone]   Fax:  [phone]  Name: SABURO LUGER MRN: [id number] Date of Birth: 1933/08/05

## 2017-12-05 NOTE — Patient Instructions (Signed)
Pt instructed and practiced with PT proper bed mobility for stand to sit to sidelying and from sidelying to sit to stand.  PT cued Pt to use transversus abdominus during position changes.  Transversus abdominus contractions encouraged by PT as beginning of stabilization program during all activities and postures.

## 2017-12-12 ENCOUNTER — Ambulatory Visit: Payer: Medicare Other | Admitting: Physical Therapy

## 2017-12-12 ENCOUNTER — Encounter: Payer: Self-pay | Admitting: Physical Therapy

## 2017-12-12 ENCOUNTER — Encounter (INDEPENDENT_AMBULATORY_CARE_PROVIDER_SITE_OTHER): Payer: Self-pay | Admitting: Physical Medicine and Rehabilitation

## 2017-12-12 DIAGNOSIS — M545 Low back pain, unspecified: Secondary | ICD-10-CM

## 2017-12-12 DIAGNOSIS — E1142 Type 2 diabetes mellitus with diabetic polyneuropathy: Secondary | ICD-10-CM | POA: Insufficient documentation

## 2017-12-12 DIAGNOSIS — M7918 Myalgia, other site: Secondary | ICD-10-CM

## 2017-12-12 DIAGNOSIS — M48061 Spinal stenosis, lumbar region without neurogenic claudication: Secondary | ICD-10-CM | POA: Insufficient documentation

## 2017-12-12 DIAGNOSIS — G8929 Other chronic pain: Secondary | ICD-10-CM | POA: Insufficient documentation

## 2017-12-12 DIAGNOSIS — M47816 Spondylosis without myelopathy or radiculopathy, lumbar region: Secondary | ICD-10-CM

## 2017-12-12 DIAGNOSIS — M6281 Muscle weakness (generalized): Secondary | ICD-10-CM

## 2017-12-12 DIAGNOSIS — M546 Pain in thoracic spine: Secondary | ICD-10-CM

## 2017-12-12 DIAGNOSIS — R293 Abnormal posture: Secondary | ICD-10-CM

## 2017-12-12 HISTORY — DX: Pain in thoracic spine: M54.6

## 2017-12-12 HISTORY — DX: Myalgia, other site: M79.18

## 2017-12-12 HISTORY — DX: Spinal stenosis, lumbar region without neurogenic claudication: M48.061

## 2017-12-12 HISTORY — DX: Other chronic pain: G89.29

## 2017-12-12 HISTORY — DX: Spondylosis without myelopathy or radiculopathy, lumbar region: M47.816

## 2017-12-12 HISTORY — DX: Type 2 diabetes mellitus with diabetic polyneuropathy: E11.42

## 2017-12-12 NOTE — Patient Instructions (Signed)
Access Code: [email or web address]  URL: [email or web address]  Date: 12/12/2017  Prepared by: Venetia Night Trindon Dorton   Exercises  Supine Hamstring Stretch - 10 reps - 2 sets - 30 hold - 1x daily - 7x weekly  Supine Piriformis Stretch with Foot on Ground - 10 reps - 2 sets - 30 hold - 1x daily - 7x weekly  Hooklying Single Knee to Chest Stretch - 10 reps - 2 sets - 5 hold - 1x daily - 7x weekly  Standing Hip Hinge with Dowel - 10 reps - 3 sets - 1x daily - 7x weekly  Single Leg Stance Stabilization with Quick Front to Back Band Oscillations - Posterior Anchor - 30 reps - 3 sets - 1x daily - 7x weekly  Single Leg Stance Stabilization with Quick Front to Back Band Oscillations - Anterior Anchor - 30 reps - 3 sets - 1x daily - 7x weekly  Patient Education  Rolling From Your Back to Your Side

## 2017-12-12 NOTE — Therapy (Signed)
[facility] [address], [place], [place], [address] Phone: [phone]   Fax:  [phone]  Physical Therapy Treatment  Patient Details  Name: Alejandro Foster MRN: [id number] Date of Birth: Feb 21, 1934 Referring Provider: Magnus Sinning, MD   Encounter Date: 12/12/2017  PT End of Session - 12/12/17 1013    Visit Number  2    Date for PT Re-Evaluation  01/30/18    Authorization Type  [employer] Medicare    PT Start Time  0922    PT Stop Time  1008    PT Time Calculation (min)  46 min    Activity Tolerance  Patient tolerated treatment well    Behavior During Therapy  [facility] for tasks assessed/performed       Past Medical History:  Diagnosis Date  . Coronary artery disease    cardiologist-  dr Wynonia Lawman--  s/p  cardiac cath's w/ angioplasty and stenting x2  . DDD (degenerative disc disease), lumbar   . History of kidney stones   . Hyperlipidemia   . Hypertension   . Lumbar radiculopathy, right    right leg weakness  . Nephrolithiasis    bilateral non-obstructive per ct 01-19-2016  . Peripheral neuropathy    feet  . Right leg weakness    due to lumbar radiculopathy  . Right ureteral stone   . S/P coronary artery stent placement    2002 x1  and 2003 x1  . Type 2 diabetes mellitus ([place])   . Wears hearing aid    BILATERAL    Past Surgical History:  Procedure Laterality Date  . CARDIAC CATHETERIZATION  01/ 2002   dr Wynonia Lawman   mLAD 30%,  CFX OM3 40%,  RCA 30% previous stent site  . CARDIOVASCULAR STRESS TEST  04-15-2009  dr Wynonia Lawman   normal nuclear study w/ no ischemia/  normal LV function and wall motion , ef 77%  . CATARACT EXTRACTION W/ INTRAOCULAR LENS  IMPLANT, BILATERAL  2013  approx.  . COLONOSCOPY WITH PROPOFOL  2014  . CORONARY ANGIOPLASTY  1990  dr Wynonia Lawman   PTCA to OM2  . CORONARY ANGIOPLASTY  10/ 1996  dr Wynonia Lawman   PTCA to RCA  . CORONARY ANGIOPLASTY WITH STENT PLACEMENT  04/ 1996  dr Wynonia Lawman   stenting to  RCA  . CYSTOSCOPY WITH URETEROSCOPY AND STENT PLACEMENT Right 03/28/2016   Procedure: CYSTOSCOPY WITH RIGHT  URETEROSCOPY AND STENT PLACEMENT;  Surgeon: Festus Aloe, MD;  Location: [facility];  Service: Urology;  Laterality: Right;  . EXTRACORPOREAL SHOCK WAVE LITHOTRIPSY  yrs ago  . HOLMIUM LASER APPLICATION Right 10/24/5641   Procedure: HOLMIUM LASER APPLICATION;  Surgeon: Festus Aloe, MD;  Location: [facility];  Service: Urology;  Laterality: Right;  . LAPAROSCOPIC CHOLECYSTECTOMY  1990's  . TONSILLECTOMY AND ADENOIDECTOMY  child  . URETEROLITHOTOMY  1990's    There were no vitals filed for this visit.  Subjective Assessment - 12/12/17 0923    Subjective  Pt reported he had a flare up putting the garbage bag into the outdoor trash can and had a flare up of pain.  Pain was sharp and intermittent thorughout the rest of the day but recovered the next day.    Limitations  Sitting;Other (comment)    How long can you sit comfortably?  1 hour    How long can you stand comfortably?  most comfortable position    How long can you walk comfortably?  2 hours  Diagnostic tests  MRI lumbar spine Mod chronic right L4 nerve level foraminal stenosis at L4/5. Mild multifactorial spinal stenosis at L3/4 with up to moderate Lt lateral recess stenosis at L4 nerve    Patient Stated Goals  put shoes on, exercise routine for weight loss (belly), keep pain to stay calm    Currently in Pain?  Yes    Pain Score  1     Pain Location  Back    Pain Orientation  Right    Pain Descriptors / Indicators  Dull   can be sharp at times   Pain Onset  More than a month ago    Aggravating Factors   lifting, twisting                       [facility] Adult PT Treatment/Exercise - 12/12/17 0001      Exercises   Exercises  Lumbar;Knee/Hip      Lumbar Exercises: Stretches   Active Hamstring Stretch  Right;Left;2 reps;30 seconds    Single Knee to Chest Stretch   Right;Left;5 reps;10 seconds    Piriformis Stretch  Right;Left;30 seconds;2 reps      Lumbar Exercises: Aerobic   Tread Mill  1.61mph x 6'      Lumbar Exercises: Standing   Other Standing Lumbar Exercises  double leg stance body blade oscillations Rt/Lt UE at sides x 1 min each, in front Rt/Lt UE 1 min each   progressed to red tband fwd/bwd UE ocsillations on both feet   Other Standing Lumbar Exercises  squats with dowel   PT cued to bend with hips and maintain core contraction     Knee/Hip Exercises: Standing   Other Standing Knee Exercises  medicine ball transfer chair to counter height with proper squat, carry and turning cues x 5 reps   PT cued to avoid bend, lift, twist combo            PT Education - 12/12/17 1012    Education Details  Access Code: [email or web address], avoidance of bend, lift, twist    Person(s) Educated  Patient    Methods  Explanation;Demonstration;Handout;Verbal cues    Comprehension  Verbalized understanding;Returned demonstration       PT Short Term Goals - 12/05/17 2003      PT SHORT TERM GOAL #1   Title  Pt will demonstrate proper body mechanics during bed mobility for reduced pain and improved safety getting in/out of bed.    Time  2    Period  Weeks    Status  New    Target Date  12/19/17      PT SHORT TERM GOAL #2   Title  Pt will be independent in initial HEP including spinal ROM, core stabilization, LE and trunk stretches.    Time  4    Period  Weeks    Status  New    Target Date  01/02/18      PT SHORT TERM GOAL #3   Title  Pt will report 50% reduction in painful episodes during position transitions.    Time  4    Period  Weeks    Status  New    Target Date  01/02/18      PT SHORT TERM GOAL #4   Title  Pt will incorporate walking for exercise > or = 20 minutes 3x/week for aerobic health and to contribute to his goal of weight loss.    Time  4  Period  Weeks    Status  New    Target Date  01/02/18        PT Long Term Goals -  12/05/17 2005      PT LONG TERM GOAL #1   Title  Pt will be independent in HEP to include spine ROM, core and LE strength, flexibility and aerobic exercise to promote reduced pain, weight loss and aerobic health.    Time  8    Period  Weeks    Status  New    Target Date  01/30/18      PT LONG TERM GOAL #2   Title  Pt will gain functional trunk and hip ROM to enable him to put his shoes on without difficulty.    Time  8    Period  Weeks    Status  New    Target Date  01/30/18      PT LONG TERM GOAL #3   Title  Pt will receive a 5/5 MMT rating in all LE muscle groups to promote continued independence in daily activities.    Time  8    Period  Weeks    Status  New    Target Date  01/30/18      PT LONG TERM GOAL #4   Title  Pt will receive a FOTO score of < or = 38% limited.    Time  8    Period  Weeks    Status  New    Target Date  01/30/18      PT LONG TERM GOAL #5   Title  Pt will be able to sit > 1 hour with pain rating of < or = 3/10.    Time  8    Period  Weeks    Status  New    Target Date  01/30/18            Plan - 12/12/17 1013    Clinical Impression Statement  Pt reported flare up with taking out trash over the weekend.  His postural awareness and body mechanics are poor and he demonstrates bending, lifting and twisting combinations for household activities.  PT spent time incorporating functional movement patterns of squatting, carrying, and transferring weighted ball today to simulate safer body mechanics with core awarenes.  Pt was able to perform with improved awareness and no occurrences of pain.    Rehab Potential  Good    PT Frequency  2x / week    PT Duration  8 weeks    PT Treatment/Interventions  ADLs/Self Care Home Management;Traction;Moist Heat;Gait training;Stair training;Functional mobility training;Therapeutic activities;Therapeutic exercise;Patient/family education;Manual techniques;Passive range of motion;Dry needling;Taping    PT Next Visit  Plan  follow up on HEP, continue functional training and strength training    Consulted and Agree with Plan of Care  Patient       Patient will benefit from skilled therapeutic intervention in order to improve the following deficits and impairments:  Improper body mechanics, Pain, Decreased mobility, Postural dysfunction, Decreased activity tolerance, Decreased endurance, Decreased range of motion, Decreased strength, Hypomobility, Impaired flexibility  Visit Diagnosis: Chronic right-sided low back pain without sciatica  Muscle weakness (generalized)  Abnormal posture     Problem List There are no active problems to display for this patient.  Venetia Night Ariana Cavenaugh, PT 12/12/17 10:16 AM    [facility] [address], [place], [place], [address] Phone: [phone]   Fax:  [phone]  Name:  JAIYON WANDER MRN: [id number] Date of Birth: [birth year]

## 2017-12-12 NOTE — Progress Notes (Signed)
[name] - 84 y.o. male MRN [id number]  Date of birth: 23-Oct-1933  Office Visit Note: Visit Date: 11/29/2017 PCP: Shon Baton, MD Referred by: Shon Baton, MD  Subjective: Chief Complaint  Patient presents with  . Lower Back - Pain  . Right Hip - Weakness  . Right Foot - Pain, Numbness  . Left Foot - Pain, Numbness   HPI: [name] is a very pleasant 84 year old gentleman who comes in today with continued recalcitrant low back pain right more than left centered over the lumbosacral junction and PSIS area.  Is worse with prolonged sitting somewhat worse with going from sit to stand but patient is able to walk without much difficulty without claudication type symptoms.  No radicular pain down the legs per se.  No paresthesias down the leg.  He is somewhat of a poor historian.  We completed transforaminal epidural steroid injection back in November after having seen in a long time before that and he felt like the injections initially helped.  We saw him back in January and it was hard to tell if the epidural injection helped that much and we did end up doing facet joint block and that did not seem to help at all.  We regroup and completed interlaminar epidural injection followed by another injection a few months later.  The injections helped to a degree but they are not really more than 50% are not very long-lasting and is just really hard to tell if the injections were getting anywhere so we did repeat his MRI.  We reviewed that with him today at length.  There is some scoliosis.  There is no high-grade central stenosis.  There is no nerve compression.  There is right more than left facet arthropathy.  His symptoms again are more sitting than standing.  He can stand fairly well.  We have already completed diagnostic blocks that just warrant is beneficial as he would like to see.  He did get enough relief with the facet joint block but it was very temporary and it was hard to discuss with him at  that time.  He has had no new trauma or falls.  He said no new focal weakness has had a history of leg weakness is more flexion of the hip but no real spine reasons.  There was noted weakness data radiculopathy but again nothing really on the MRI that would hinted any nerve compression.  He has a history of polyneuropathy of the legs.  He is an insulin-dependent diabetic but we do not have any recent hemoglobin A1c values in our chart at least.  He reports good control of his blood sugar.  He also reports peripheral neuropathy in the bilateral feet.  Some burning sensation.  He also has some upper back/mid back pain.  Really pain around the shoulder blade region.   Review of Systems  Constitutional: Negative for chills, fever, malaise/fatigue and weight loss.  HENT: Negative for hearing loss and sinus pain.   Eyes: Negative for blurred vision, double vision and photophobia.  Respiratory: Negative for cough and shortness of breath.   Cardiovascular: Negative for chest pain, palpitations and leg swelling.  Gastrointestinal: Negative for abdominal pain, nausea and vomiting.  Genitourinary: Negative for flank pain.  Musculoskeletal: Positive for back pain and joint pain. Negative for myalgias.  Skin: Negative for itching and rash.  Neurological: Positive for weakness. Negative for tremors and focal weakness.  Endo/Heme/Allergies: Negative.   Psychiatric/Behavioral: Negative for depression.  All  other systems reviewed and are negative.  Otherwise per HPI.  Assessment & Plan: Visit Diagnoses:  1. Foraminal stenosis of lumbar region   2. Chronic bilateral thoracic back pain   3. Myofascial pain syndrome   4. Spondylosis without myelopathy or radiculopathy, lumbar region   5. Weakness   6. Diabetic peripheral neuropathy associated with type 2 diabetes mellitus ([place])     Plan: Findings:  Continued low back pain mostly right-sided worse with sitting he feels unsteady with weakness in the right  thigh and he related this at least historically to a lumbar radiculopathy.  MRI does not show any nerve compression there is scoliosis and facet arthropathy.  He really has not gotten great relief this long-lasting with epidural injection or facet joint block.  Facet joint block was diagnostically better but very short-lived.  I think the next approach is to regroup with physical therapy with a good therapist and see if they can look at this more as a piriformis type syndrome with strengthening around the hip girdle and manual treatment as well.  Otherwise we would look at perhaps a diagnostic medial branch block one more time with good education on facet pain and radiofrequency ablation potential.  Otherwise can contemplate spinal cord stimulator.  Patient is 50 and a poor historian he probably could handle at least the mechanical aspect of that.  He would be a candidate from a diagnostic standpoint.  He has recalcitrant pain and he also has peripheral neuropathy.  He has taken hydrocodone and tramadol in the past.  He would not be a great candidate for chronic escalating opioid therapy.  We will see him back as needed and go from there.  No change in other medications.    Meds & Orders:  Meds ordered this encounter  Medications  . DISCONTD: betamethasone acetate-betamethasone sodium phosphate (CELESTONE) injection 12 mg    Orders Placed This Encounter  Procedures  . Ambulatory referral to Physical Therapy    Follow-up: Return if symptoms worsen or fail to improve.   Procedures: No procedures performed  No notes on file   Clinical History: MRI LUMBAR SPINE WITHOUT CONTRAST  TECHNIQUE: Multiplanar, multisequence MR imaging of the lumbar spine was performed. No intravenous contrast was administered.  COMPARISON:  Office radiographs 07/15/2015.  FINDINGS: Segmentation: Conventional anatomy assumed, with the last open disc space designated L5-S1.  Alignment: There is a moderate convex  right scoliosis centered at L2. There is 2 mm of degenerative anterolisthesis at L4-5.  Vertebrae: No worrisome osseous lesion, acute fracture or pars defect. The visualized sacroiliac joints appear unremarkable.  Conus medullaris: Extends to the L1-2 level and appears normal.  Paraspinal and other soft tissues: No significant paraspinal findings. A left renal cyst is partially imaged.  Disc levels:  T11-12: Mild loss of disc height and annular disc bulging. No spinal stenosis or nerve root encroachment.  There are no significant disc space findings at T12-L1 or L1-2.  L2-3: Mild disc bulging, facet and ligamentous hypertrophy. No spinal stenosis or nerve root encroachment.  L3-4: Annular disc bulging, facet and ligamentous hypertrophy contribute to mild spinal stenosis and mild narrowing of the lateral recesses, left greater than right. The foramina are sufficiently patent.  L4-5: There is loss of disc height with annular disc bulging and endplate osteophytes asymmetric to the right. There is also asymmetric right-sided facet hypertrophy. These factors contribute to mild-to-moderate right foraminal and mild right lateral recess narrowing. There is no definite nerve root encroachment. The spinal canal  is adequately patent.  L5-S1: Fairly symmetric disc bulging with asymmetric facet hypertrophy on the right. Mild right foraminal narrowing. No definite nerve root encroachment.  IMPRESSION: 1. Convex right lumbar scoliosis with associated multilevel spondylosis. No acute findings demonstrated. 2. Mild multifactorial spinal stenosis at L3-4 with mild narrowing of the lateral recesses, left greater than right. 3. Mild-to-moderate right foraminal and mild right lateral recess narrowing at L4-5 without definite nerve root encroachment. 4. Mild right foraminal narrowing at L5-S1.   Electronically Signed   By: Richardean Sale M.D.   On: 08/22/2015 15:41   He  reports that he quit smoking about 29 years ago. His smoking use included cigarettes. He quit after 40.00 years of use. He has never used smokeless tobacco. No results for input(s): HGBA1C, LABURIC in the last 8760 hours.  Objective:  VS:  HT:    WT:   BMI:     BP:(!) 127/59  HR:(!) 59bpm  TEMP:98.5 F (36.9 C)(Oral)  RESP:  Physical Exam  Constitutional: He is oriented to person, place, and time. He appears well-developed and well-nourished. No distress.  HENT:  Head: Normocephalic and atraumatic.  Nose: Nose normal.  Mouth/Throat: Oropharynx is clear and moist.  Eyes: Pupils are equal, round, and reactive to light. Conjunctivae are normal.  Neck: Normal range of motion. Neck supple. No tracheal deviation present.  Cardiovascular: Regular rhythm and intact distal pulses.  Pulmonary/Chest: Effort normal and breath sounds normal.  Abdominal: Soft. He exhibits no distension. There is no rebound and no guarding.  Musculoskeletal: He exhibits no deformity.  Patient is slow to rise from a seated position does have some balance difficulties.  He has a negative Romberg test negative pronator drift.  He has good distal strength without clonus.  Strength in the thighs instantaneously he seems to resist effort but then I am able to push him down with the right thigh more than the left.  This is in flexion.  He has no pain around the paraspinal region.  He has no real atrophy noted.  He does have subjective decreased sensation in a stocking distribution bilaterally the lower extremities.  No ulcers noted.  Neurological: He is alert and oriented to person, place, and time. He exhibits normal muscle tone. Coordination normal.  Skin: Skin is warm. No rash noted.  Psychiatric: He has a normal mood and affect. His behavior is normal.  Nursing note and vitals reviewed.   Ortho Exam Imaging: No results found.  Past Medical/Family/Surgical/Social History: Medications & Allergies reviewed per EMR, new  medications updated. There are no active problems to display for this patient.  Past Medical History:  Diagnosis Date  . Coronary artery disease    cardiologist-  dr Wynonia Lawman--  s/p  cardiac cath's w/ angioplasty and stenting x2  . DDD (degenerative disc disease), lumbar   . History of kidney stones   . Hyperlipidemia   . Hypertension   . Lumbar radiculopathy, right    right leg weakness  . Nephrolithiasis    bilateral non-obstructive per ct 01-19-2016  . Peripheral neuropathy    feet  . Right leg weakness    due to lumbar radiculopathy  . Right ureteral stone   . S/P coronary artery stent placement    2002 x1  and 2003 x1  . Type 2 diabetes mellitus ([place])   . Wears hearing aid    BILATERAL   History reviewed. No pertinent family history. Past Surgical History:  Procedure Laterality Date  . CARDIAC CATHETERIZATION  01/ 2002   dr Wynonia Lawman   mLAD 30%,  CFX OM3 40%,  RCA 30% previous stent site  . CARDIOVASCULAR STRESS TEST  04-15-2009  dr Wynonia Lawman   normal nuclear study w/ no ischemia/  normal LV function and wall motion , ef 77%  . CATARACT EXTRACTION W/ INTRAOCULAR LENS  IMPLANT, BILATERAL  2013  approx.  . COLONOSCOPY WITH PROPOFOL  2014  . CORONARY ANGIOPLASTY  1990  dr Wynonia Lawman   PTCA to OM2  . CORONARY ANGIOPLASTY  10/ 1996  dr Wynonia Lawman   PTCA to RCA  . CORONARY ANGIOPLASTY WITH STENT PLACEMENT  04/ 1996  dr Wynonia Lawman   stenting to RCA  . CYSTOSCOPY WITH URETEROSCOPY AND STENT PLACEMENT Right 03/28/2016   Procedure: CYSTOSCOPY WITH RIGHT  URETEROSCOPY AND STENT PLACEMENT;  Surgeon: Festus Aloe, MD;  Location[facility];  Service: Urology;  Laterality: Right;  . EXTRACORPOREAL SHOCK WAVE LITHOTRIPSY  yrs ago  . HOLMIUM LASER APPLICATION Right 08/25/7371   Procedure: HOLMIUM LASER APPLICATION;  Surgeon: Festus Aloe, MD;  Location[facility];  Service: Urology;  Laterality: Right;  . LAPAROSCOPIC CHOLECYSTECTOMY  1990's  . TONSILLECTOMY AND  ADENOIDECTOMY  child  . URETEROLITHOTOMY  1990's   Social History   Occupational History  . Not on file  Tobacco Use  . Smoking status: Former Smoker    Years: 40.00    Types: Cigarettes    Last attempt to quit: 08/26/1988    Years since quitting: 29.3  . Smokeless tobacco: Never Used  Substance and Sexual Activity  . Alcohol use: Yes    Comment: rare  . Drug use: No  . Sexual activity: Not on file

## 2017-12-14 ENCOUNTER — Encounter: Payer: Self-pay | Admitting: Physical Therapy

## 2017-12-14 ENCOUNTER — Ambulatory Visit: Payer: Medicare Other | Admitting: Physical Therapy

## 2017-12-14 DIAGNOSIS — G8929 Other chronic pain: Secondary | ICD-10-CM

## 2017-12-14 DIAGNOSIS — M6281 Muscle weakness (generalized): Secondary | ICD-10-CM

## 2017-12-14 DIAGNOSIS — R293 Abnormal posture: Secondary | ICD-10-CM

## 2017-12-14 DIAGNOSIS — M545 Low back pain: Principal | ICD-10-CM

## 2017-12-14 DIAGNOSIS — R2681 Unsteadiness on feet: Secondary | ICD-10-CM

## 2017-12-14 DIAGNOSIS — R2689 Other abnormalities of gait and mobility: Secondary | ICD-10-CM

## 2017-12-14 NOTE — Therapy (Signed)
[facility] [address] [place], [place], [address] Phone: [phone]   Fax:  [phone]  Physical Therapy Treatment  Patient Details  Name: Alejandro Foster MRN: [id number] Date of Birth: June 16, 1933 Referring Provider (PT): Magnus Sinning, MD   Encounter Date: 12/14/2017  PT End of Session - 12/14/17 1053    Visit Number  3    Date for PT Re-Evaluation  01/30/18    Authorization Type  [employer] Medicare    PT Start Time  1050    PT Stop Time  1133    PT Time Calculation (min)  43 min    Activity Tolerance  Patient tolerated treatment well    Behavior During Therapy  [facility] for tasks assessed/performed       Past Medical History:  Diagnosis Date  . Coronary artery disease    cardiologist-  dr Wynonia Lawman--  s/p  cardiac cath's w/ angioplasty and stenting x2  . DDD (degenerative disc disease), lumbar   . History of kidney stones   . Hyperlipidemia   . Hypertension   . Lumbar radiculopathy, right    right leg weakness  . Nephrolithiasis    bilateral non-obstructive per ct 01-19-2016  . Peripheral neuropathy    feet  . Right leg weakness    due to lumbar radiculopathy  . Right ureteral stone   . S/P coronary artery stent placement    2002 x1  and 2003 x1  . Type 2 diabetes mellitus ([place])   . Wears hearing aid    BILATERAL    Past Surgical History:  Procedure Laterality Date  . CARDIAC CATHETERIZATION  01/ 2002   dr Wynonia Lawman   mLAD 30%,  CFX OM3 40%,  RCA 30% previous stent site  . CARDIOVASCULAR STRESS TEST  04-15-2009  dr Wynonia Lawman   normal nuclear study w/ no ischemia/  normal LV function and wall motion , ef 77%  . CATARACT EXTRACTION W/ INTRAOCULAR LENS  IMPLANT, BILATERAL  2013  approx.  . COLONOSCOPY WITH PROPOFOL  2014  . CORONARY ANGIOPLASTY  1990  dr Wynonia Lawman   PTCA to OM2  . CORONARY ANGIOPLASTY  10/ 1996  dr Wynonia Lawman   PTCA to RCA  . CORONARY ANGIOPLASTY WITH STENT PLACEMENT  04/ 1996  dr Wynonia Lawman   stenting  to RCA  . CYSTOSCOPY WITH URETEROSCOPY AND STENT PLACEMENT Right 03/28/2016   Procedure: CYSTOSCOPY WITH RIGHT  URETEROSCOPY AND STENT PLACEMENT;  Surgeon: Festus Aloe, MD;  Location: [facility];  Service: Urology;  Laterality: Right;  . EXTRACORPOREAL SHOCK WAVE LITHOTRIPSY  yrs ago  . HOLMIUM LASER APPLICATION Right 06/18/6604   Procedure: HOLMIUM LASER APPLICATION;  Surgeon: Festus Aloe, MD;  Location: [facility];  Service: Urology;  Laterality: Right;  . LAPAROSCOPIC CHOLECYSTECTOMY  1990's  . TONSILLECTOMY AND ADENOIDECTOMY  child  . URETEROLITHOTOMY  1990's    There were no vitals filed for this visit.  Subjective Assessment - 12/14/17 1054    Subjective  Feeling good. No pain this AM. I think the walking is most beneficial.     Diagnostic tests  MRI lumbar spine Mod chronic right L4 nerve level foraminal stenosis at L4/5. Mild multifactorial spinal stenosis at L3/4 with up to moderate Lt lateral recess stenosis at L4 nerve    Currently in Pain?  No/denies    Multiple Pain Sites  No  [place] Adult PT Treatment/Exercise - 12/14/17 0001      Lumbar Exercises: Stretches   Active Hamstring Stretch  Right;Left;2 reps;30 seconds   Used sheet to assit, TC to get knee straighter     Lumbar Exercises: Aerobic   Nustep  L2 x 10 min   PTA present to discuss current status     Knee/Hip Exercises: Standing   Hip Abduction  Stengthening;Both;2 sets;10 reps;Knee straight   VC for posture and lower abs prior to lifting     Knee/Hip Exercises: Seated   Sit to Sand  2 sets;10 reps;without UE support   green band for hor abd 2x10 upon standing: added to HEP            PT Education - 12/14/17 1116    Education Details  Sit ti stand wiht green band shoulder horizontal abd upon standing; re-education on supine hamstring stretching.      Person(s) Educated  Patient    Methods  Explanation;Demonstration;Verbal  cues;Handout    Comprehension  Returned demonstration;Verbalized understanding       PT Short Term Goals - 12/05/17 2003      PT SHORT TERM GOAL #1   Title  Pt will demonstrate proper body mechanics during bed mobility for reduced pain and improved safety getting in/out of bed.    Time  2    Period  Weeks    Status  New    Target Date  12/19/17      PT SHORT TERM GOAL #2   Title  Pt will be independent in initial HEP including spinal ROM, core stabilization, LE and trunk stretches.    Time  4    Period  Weeks    Status  New    Target Date  01/02/18      PT SHORT TERM GOAL #3   Title  Pt will report 50% reduction in painful episodes during position transitions.    Time  4    Period  Weeks    Status  New    Target Date  01/02/18      PT SHORT TERM GOAL #4   Title  Pt will incorporate walking for exercise > or = 20 minutes 3x/week for aerobic health and to contribute to his goal of weight loss.    Time  4    Period  Weeks    Status  New    Target Date  01/02/18        PT Long Term Goals - 12/05/17 2005      PT LONG TERM GOAL #1   Title  Pt will be independent in HEP to include spine ROM, core and LE strength, flexibility and aerobic exercise to promote reduced pain, weight loss and aerobic health.    Time  8    Period  Weeks    Status  New    Target Date  01/30/18      PT LONG TERM GOAL #2   Title  Pt will gain functional trunk and hip ROM to enable him to put his shoes on without difficulty.    Time  8    Period  Weeks    Status  New    Target Date  01/30/18      PT LONG TERM GOAL #3   Title  Pt will receive a 5/5 MMT rating in all LE muscle groups to promote continued independence in daily activities.    Time  8    Period  Weeks  Status  New    Target Date  01/30/18      PT LONG TERM GOAL #4   Title  Pt will receive a FOTO score of < or = 38% limited.    Time  8    Period  Weeks    Status  New    Target Date  01/30/18      PT LONG TERM GOAL #5    Title  Pt will be able to sit > 1 hour with pain rating of < or = 3/10.    Time  8    Period  Weeks    Status  New    Target Date  01/30/18            Plan - 12/14/17 1053    Clinical Impression Statement  Pt presents today with no pain and compliant with his HEP. He is considering getting into the pool for exercise. oday we added sit to stand to his current HEP with the use of the green band for his postural strength. Pt need some re-education on his supine hams strech as he tends to bend his knee too much.     Rehab Potential  Good    PT Frequency  2x / week    PT Duration  8 weeks    PT Treatment/Interventions  ADLs/Self Care Home Management;Traction;Moist Heat;Gait training;Stair training;Functional mobility training;Therapeutic activities;Therapeutic exercise;Patient/family education;Manual techniques;Passive range of motion;Dry needling;Taping    PT Next Visit Plan  follow up on HEP, continue functional training and strength training    PT Home Exercise Plan  Access Code: [email or web address]    Consulted and Agree with Plan of Care  Patient       Patient will benefit from skilled therapeutic intervention in order to improve the following deficits and impairments:  Improper body mechanics, Pain, Decreased mobility, Postural dysfunction, Decreased activity tolerance, Decreased endurance, Decreased range of motion, Decreased strength, Hypomobility, Impaired flexibility  Visit Diagnosis: Chronic right-sided low back pain without sciatica  Muscle weakness (generalized)  Abnormal posture  Unsteadiness on feet  Other abnormalities of gait and mobility     Problem List Patient Active Problem List   Diagnosis Date Noted  . Diabetic peripheral neuropathy associated with type 2 diabetes mellitus ([place]) 12/12/2017  . Chronic bilateral thoracic back pain 12/12/2017  . Myofascial pain syndrome 12/12/2017  . Foraminal stenosis of lumbar region 12/12/2017  . Spondylosis without myelopathy  or radiculopathy, lumbar region 12/12/2017    Lajune Perine, PTA 12/14/2017, 11:38 AM  [facility] [address] [place], [place], [address] Phone: [phone]   Fax:  [phone]  Name: Alejandro Foster MRN: [id number] Date of Birth: 06-Sep-1933  Access Code: [email or web address]  URL: [email or web address]  Date: 12/14/2017  Prepared by: Myrene Galas   Exercises  Supine Hamstring Stretch - 10 reps - 2 sets - 30 hold - 1x daily - 7x weekly  Supine Piriformis Stretch with Foot on Ground - 10 reps - 2 sets - 30 hold - 1x daily - 7x weekly  Hooklying Single Knee to Chest Stretch - 10 reps - 2 sets - 5 hold - 1x daily - 7x weekly  Standing Hip Hinge with Dowel - 10 reps - 3 sets - 1x daily - 7x weekly  Single Leg Stance Stabilization with Quick Front to Back Band Oscillations - Posterior Anchor - 30 reps - 3 sets - 1x daily - 7x weekly  Single Leg Stance Stabilization with Quick Front to  Back Band Oscillations - Anterior Anchor - 30 reps - 3 sets - 1x daily - 7x weekly  Sit to Stand without Arm Support - 10 reps - 10 sets - 2x daily - 7x weekly  Patient Education  Rolling From Your Back to Your Side

## 2017-12-19 ENCOUNTER — Encounter: Payer: Self-pay | Admitting: Physical Therapy

## 2017-12-19 ENCOUNTER — Ambulatory Visit: Payer: Medicare Other | Attending: Physical Medicine and Rehabilitation | Admitting: Physical Therapy

## 2017-12-19 DIAGNOSIS — R2689 Other abnormalities of gait and mobility: Secondary | ICD-10-CM | POA: Diagnosis present

## 2017-12-19 DIAGNOSIS — R2681 Unsteadiness on feet: Secondary | ICD-10-CM | POA: Diagnosis present

## 2017-12-19 DIAGNOSIS — M545 Low back pain: Secondary | ICD-10-CM | POA: Diagnosis present

## 2017-12-19 DIAGNOSIS — M6281 Muscle weakness (generalized): Secondary | ICD-10-CM

## 2017-12-19 DIAGNOSIS — G8929 Other chronic pain: Secondary | ICD-10-CM | POA: Insufficient documentation

## 2017-12-19 DIAGNOSIS — R293 Abnormal posture: Secondary | ICD-10-CM | POA: Diagnosis present

## 2017-12-19 NOTE — Therapy (Signed)
[facility] [address], [place], [place], [address] Phone: [phone]   Fax:  [phone]  Physical Therapy Treatment  Patient Details  Name: Alejandro Foster MRN: [id number] Date of Birth: Oct 24, 1933 Referring Provider (PT): Magnus Sinning, MD   Encounter Date: 12/19/2017  PT End of Session - 12/19/17 0757    Visit Number  4    Date for PT Re-Evaluation  01/30/18    Authorization Type  UHC Medicare    PT Start Time  0758    PT Stop Time  0839    PT Time Calculation (min)  41 min    Activity Tolerance  Patient tolerated treatment well    Behavior During Therapy  [facility] for tasks assessed/performed       Past Medical History:  Diagnosis Date  . Coronary artery disease    cardiologist-  dr Wynonia Lawman--  s/p  cardiac cath's w/ angioplasty and stenting x2  . DDD (degenerative disc disease), lumbar   . History of kidney stones   . Hyperlipidemia   . Hypertension   . Lumbar radiculopathy, right    right leg weakness  . Nephrolithiasis    bilateral non-obstructive per ct 01-19-2016  . Peripheral neuropathy    feet  . Right leg weakness    due to lumbar radiculopathy  . Right ureteral stone   . S/P coronary artery stent placement    2002 x1  and 2003 x1  . Type 2 diabetes mellitus ([place])   . Wears hearing aid    BILATERAL    Past Surgical History:  Procedure Laterality Date  . CARDIAC CATHETERIZATION  01/ 2002   dr Wynonia Lawman   mLAD 30%,  CFX OM3 40%,  RCA 30% previous stent site  . CARDIOVASCULAR STRESS TEST  04-15-2009  dr Wynonia Lawman   normal nuclear study w/ no ischemia/  normal LV function and wall motion , ef 77%  . CATARACT EXTRACTION W/ INTRAOCULAR LENS  IMPLANT, BILATERAL  2013  approx.  . COLONOSCOPY WITH PROPOFOL  2014  . CORONARY ANGIOPLASTY  1990  dr Wynonia Lawman   PTCA to OM2  . CORONARY ANGIOPLASTY  10/ 1996  dr Wynonia Lawman   PTCA to RCA  . CORONARY ANGIOPLASTY WITH STENT PLACEMENT  04/ 1996  dr Wynonia Lawman   stenting  to RCA  . CYSTOSCOPY WITH URETEROSCOPY AND STENT PLACEMENT Right 03/28/2016   Procedure: CYSTOSCOPY WITH RIGHT  URETEROSCOPY AND STENT PLACEMENT;  Surgeon: Festus Aloe, MD;  Location: [facility];  Service: Urology;  Laterality: Right;  . EXTRACORPOREAL SHOCK WAVE LITHOTRIPSY  yrs ago  . HOLMIUM LASER APPLICATION Right 0/0/9381   Procedure: HOLMIUM LASER APPLICATION;  Surgeon: Festus Aloe, MD;  Location: [facility];  Service: Urology;  Laterality: Right;  . LAPAROSCOPIC CHOLECYSTECTOMY  1990's  . TONSILLECTOMY AND ADENOIDECTOMY  child  . URETEROLITHOTOMY  1990's    There were no vitals filed for this visit.  Subjective Assessment - 12/19/17 0757    Subjective  Pt reports he is doing well.  Sleeping through the night without pain interruptions.  Walking daily for a little over 20 minutes.  He continues to be reminded of sharp pain if he isn't careful about log rolling out of bed in the morning.  He was able to put his socks on with greater ease but is still working on flexibility to get his shoes on.    Diagnostic tests  MRI lumbar spine Mod chronic right L4 nerve level  foraminal stenosis at L4/5. Mild multifactorial spinal stenosis at L3/4 with up to moderate Lt lateral recess stenosis at L4 nerve    Patient Stated Goals  put shoes on, exercise routine for weight loss (belly), keep pain to stay calm    Currently in Pain?  No/denies                       [facility] Adult PT Treatment/Exercise - 12/19/17 0001      Bed Mobility   Bed Mobility  Right Sidelying to Sit;Left Sidelying to Sit;Rolling Right;Rolling Left   PT provided cueing to log roll with ab set and exhale     Exercises   Exercises  Lumbar;Knee/Hip      Lumbar Exercises: Stretches   Active Hamstring Stretch  Right;Left;2 reps;30 seconds    Single Knee to Chest Stretch  Right;Left;5 reps;10 seconds    Piriformis Stretch  Right;Left;20 seconds;2 reps      Lumbar Exercises:  Aerobic   Tread Mill  L1 x 6'    Nustep  L1x6'      Lumbar Exercises: Standing   Other Standing Lumbar Exercises  side stepping with UE red tband reactive isometrics of trunk       Lumbar Exercises: Supine   Large Ball Abdominal Isometric  10 reps;5 seconds    Large Ball Abdominal Isometric Limitations  shoulder press, shoulder adduction squeeze    Large Ball Oblique Isometric  10 reps;5 seconds      Knee/Hip Exercises: Standing   Forward Step Up  Left;Right;10 reps   PT cued to get COG forward and drive through heel   Forward Step Up Limitations  min use of bil UEs for balance/safety               PT Short Term Goals - 12/19/17 0802      PT SHORT TERM GOAL #1   Title  Pt will demonstrate proper body mechanics during bed mobility for reduced pain and improved safety getting in/out of bed.    Time  2    Period  Weeks    Status  Achieved      PT SHORT TERM GOAL #2   Title  Pt will be independent in initial HEP including spinal ROM, core stabilization, LE and trunk stretches.    Time  4    Period  Weeks    Status  Achieved      PT SHORT TERM GOAL #3   Title  Pt will report 50% reduction in painful episodes during position transitions.   Pt reports 70% improvement   Time  4    Period  Weeks    Status  Achieved      PT SHORT TERM GOAL #4   Title  Pt will incorporate walking for exercise > or = 20 minutes 3x/week for aerobic health and to contribute to his goal of weight loss.   pt is walking every day x 20+ min   Time  4    Period  Weeks    Status  Achieved        PT Long Term Goals - 12/19/17 1916      PT LONG TERM GOAL #2   Title  Pt will gain functional trunk and hip ROM to enable him to put his shoes on without difficulty.   Pt can get socks on and uses shoe horn for shoes   Time  8    Period  Weeks  Status  On-going            Plan - 12/19/17 0829    Clinical Impression Statement  Pt reports improvement in pain and has increased his  functional performance of daily tasks such as ease with donning shoes and socks.  He has also met all short term goals incorporating a walking program and his initial HEP.  He continues to be aware of the threat of pain when he doesn't use proper body mechanics getting in and out of bed.  Bed mobility reviewed today with min-mod cueing to remember to use ab set and log roll.  Pt needed verbal and tactile cueing not to overuse full abdominal bracing and breath holding with ab isometrics.  PT cued Pt to get COG over feet on step ups to simulate stairs at home.  Pt will continue to benefit from skilled advancement of strengthening for trunk and LEs to improve safe functional independence.    Rehab Potential  Good    PT Frequency  2x / week    PT Duration  8 weeks    PT Treatment/Interventions  ADLs/Self Care Home Management;Traction;Moist Heat;Gait training;Stair training;Functional mobility training;Therapeutic activities;Therapeutic exercise;Patient/family education;Manual techniques;Passive range of motion;Dry needling;Taping    PT Next Visit Plan  follow up on HEP, continue functional training and strength training    PT Home Exercise Plan  Access Code: [email or web address]    Consulted and Agree with Plan of Care  Patient       Patient will benefit from skilled therapeutic intervention in order to improve the following deficits and impairments:  Improper body mechanics, Pain, Decreased mobility, Postural dysfunction, Decreased activity tolerance, Decreased endurance, Decreased range of motion, Decreased strength, Hypomobility, Impaired flexibility  Visit Diagnosis: Chronic right-sided low back pain without sciatica  Muscle weakness (generalized)  Abnormal posture  Unsteadiness on feet     Problem List Patient Active Problem List   Diagnosis Date Noted  . Diabetic peripheral neuropathy associated with type 2 diabetes mellitus ([place]) 12/12/2017  . Chronic bilateral thoracic back pain 12/12/2017  .  Myofascial pain syndrome 12/12/2017  . Foraminal stenosis of lumbar region 12/12/2017  . Spondylosis without myelopathy or radiculopathy, lumbar region 12/12/2017   Baruch Merl, PT 12/19/17 8:42 AM   [facility] [address], [place], [place], [address] Phone: [phone]   Fax:  [phone]  Name: ITAMAR MCGOWAN MRN: [id number] Date of Birth: 1933/11/26

## 2017-12-21 ENCOUNTER — Ambulatory Visit: Payer: Medicare Other | Admitting: Physical Therapy

## 2017-12-21 ENCOUNTER — Encounter: Payer: Self-pay | Admitting: Physical Therapy

## 2017-12-21 DIAGNOSIS — G8929 Other chronic pain: Secondary | ICD-10-CM

## 2017-12-21 DIAGNOSIS — M545 Low back pain, unspecified: Secondary | ICD-10-CM

## 2017-12-21 DIAGNOSIS — M6281 Muscle weakness (generalized): Secondary | ICD-10-CM

## 2017-12-21 DIAGNOSIS — R2681 Unsteadiness on feet: Secondary | ICD-10-CM

## 2017-12-21 DIAGNOSIS — R293 Abnormal posture: Secondary | ICD-10-CM

## 2017-12-21 NOTE — Therapy (Signed)
[facility] [address], [place], [place], [address] Phone: [phone]   Fax:  [phone]  Physical Therapy Treatment  Patient Details  Name: Alejandro Foster MRN: [id number] Date of Birth: [birth year] Referring Provider (PT): Magnus Sinning, MD   Encounter Date: 12/21/2017  PT End of Session - 12/21/17 1013    Visit Number  5    Date for PT Re-Evaluation  01/30/18    Authorization Type  [employer] Medicare    PT Start Time  0932    PT Stop Time  1016    PT Time Calculation (min)  44 min    Activity Tolerance  Patient tolerated treatment well    Behavior During Therapy  [facility] for tasks assessed/performed       Past Medical History:  Diagnosis Date  . Coronary artery disease    cardiologist-  dr Wynonia Lawman--  s/p  cardiac cath's w/ angioplasty and stenting x2  . DDD (degenerative disc disease), lumbar   . History of kidney stones   . Hyperlipidemia   . Hypertension   . Lumbar radiculopathy, right    right leg weakness  . Nephrolithiasis    bilateral non-obstructive per ct 01-19-2016  . Peripheral neuropathy    feet  . Right leg weakness    due to lumbar radiculopathy  . Right ureteral stone   . S/P coronary artery stent placement    2002 x1  and 2003 x1  . Type 2 diabetes mellitus ([place])   . Wears hearing aid    BILATERAL    Past Surgical History:  Procedure Laterality Date  . CARDIAC CATHETERIZATION  01/ 2002   dr Wynonia Lawman   mLAD 30%,  CFX OM3 40%,  RCA 30% previous stent site  . CARDIOVASCULAR STRESS TEST  04-15-2009  dr Wynonia Lawman   normal nuclear study w/ no ischemia/  normal LV function and wall motion , ef 77%  . CATARACT EXTRACTION W/ INTRAOCULAR LENS  IMPLANT, BILATERAL  2013  approx.  . COLONOSCOPY WITH PROPOFOL  2014  . CORONARY ANGIOPLASTY  1990  dr Wynonia Lawman   PTCA to OM2  . CORONARY ANGIOPLASTY  10/ 1996  dr Wynonia Lawman   PTCA to RCA  . CORONARY ANGIOPLASTY WITH STENT PLACEMENT  04/ 1996  dr Wynonia Lawman   stenting  to RCA  . CYSTOSCOPY WITH URETEROSCOPY AND STENT PLACEMENT Right 03/28/2016   Procedure: CYSTOSCOPY WITH RIGHT  URETEROSCOPY AND STENT PLACEMENT;  Surgeon: Festus Aloe, MD;  Location: [facility];  Service: Urology;  Laterality: Right;  . EXTRACORPOREAL SHOCK WAVE LITHOTRIPSY  yrs ago  . HOLMIUM LASER APPLICATION Right 07/23/2561   Procedure: HOLMIUM LASER APPLICATION;  Surgeon: Festus Aloe, MD;  Location: [facility];  Service: Urology;  Laterality: Right;  . LAPAROSCOPIC CHOLECYSTECTOMY  1990's  . TONSILLECTOMY AND ADENOIDECTOMY  child  . URETEROLITHOTOMY  1990's    There were no vitals filed for this visit.  Subjective Assessment - 12/21/17 0936    Subjective  Pt had Rt back pain while in dentist chair getting his cleaning yesterday but otherwise has been painfree and continuing his walking program and HEP.    Diagnostic tests  MRI lumbar spine Mod chronic right L4 nerve level foraminal stenosis at L4/5. Mild multifactorial spinal stenosis at L3/4 with up to moderate Lt lateral recess stenosis at L4 nerve    Patient Stated Goals  put shoes on, exercise routine for weight loss (belly), keep pain to stay calm  Currently in Pain?  Yes    Pain Score  1     Pain Location  Back    Pain Orientation  Right    Pain Descriptors / Indicators  --   catching   Pain Onset  More than a month ago    Pain Frequency  Intermittent    Aggravating Factors   lifting/twisting    Effect of Pain on Daily Activities  position transitions if not careful and using core                       [facility] Adult PT Treatment/Exercise - 12/21/17 0001      Bed Mobility   Bed Mobility  Right Sidelying to Sit;Left Sidelying to Sit;Rolling Right;Rolling Left   PT provided cueing to log roll with ab set and exhale     Exercises   Exercises  Lumbar;Knee/Hip;Ankle      Lumbar Exercises: Stretches   Active Hamstring Stretch  Right;Left;2 reps;30 seconds    Double  Knee to Chest Stretch  5 reps;10 seconds   feet on red ball   Piriformis Stretch  Right;Left;20 seconds;2 reps      Lumbar Exercises: Aerobic   Tread Mill  L1 x 4' fwd 1.65mph, 1' sidestepping Lt 1.29mph, 1' sidestepping Rt 1.53mph    Nustep  legs only level 3 x 6'      Lumbar Exercises: Standing   Other Standing Lumbar Exercises  sidestepping with red tband at countertop 10' Rt/Lt x 5 sets    Other Standing Lumbar Exercises  sidestepping, fwd/bwd walking trunk reactive isometrics with UE tband red 5x10 sec each direction   mini squats with bil UE red tband pullbacks and pullforward     Lumbar Exercises: Supine   Large Ball Abdominal Isometric  10 reps;5 seconds   PT cued to perform glut set simultaneously     Knee/Hip Exercises: Supine   Other Supine Knee/Hip Exercises  glut sets 10x5 sec eachs      Ankle Exercises: Stretches   Gastroc Stretch  2 reps;20 seconds   bil with slant board              PT Short Term Goals - 12/19/17 0802      PT SHORT TERM GOAL #1   Title  Pt will demonstrate proper body mechanics during bed mobility for reduced pain and improved safety getting in/out of bed.    Time  2    Period  Weeks    Status  Achieved      PT SHORT TERM GOAL #2   Title  Pt will be independent in initial HEP including spinal ROM, core stabilization, LE and trunk stretches.    Time  4    Period  Weeks    Status  Achieved      PT SHORT TERM GOAL #3   Title  Pt will report 50% reduction in painful episodes during position transitions.   Pt reports 70% improvement   Time  4    Period  Weeks    Status  Achieved      PT SHORT TERM GOAL #4   Title  Pt will incorporate walking for exercise > or = 20 minutes 3x/week for aerobic health and to contribute to his goal of weight loss.   pt is walking every day x 20+ min   Time  4    Period  Weeks    Status  Achieved  PT Long Term Goals - 12/19/17 0804      PT LONG TERM GOAL #2   Title  Pt will gain functional  trunk and hip ROM to enable him to put his shoes on without difficulty.   Pt can get socks on and uses shoe horn for shoes   Time  8    Period  Weeks    Status  On-going            Plan - 12/21/17 1013    Clinical Impression Statement  Pt reported improved Rt back and buttock pain and catching with exercise today.  His core and hip muscle recruitment and body mechanics pattern with squats is improving.  PT continues to provide needed min-mod cueing to perform hip hinge during squats and use exhale and bil UEs to push from sidelying to sitting during bed mobility.  His pain is well controlled with increased use of core and proper body mechanics.  He was unable to perform bridging today without Rt LBP so PT did bridging prep work of glut sets instead.  Pt will continue to benefit from skilled progression of core and LE strengthening to improve safe daily task performance with continued pain management.    Rehab Potential  Good    PT Frequency  2x / week    PT Duration  8 weeks    PT Treatment/Interventions  ADLs/Self Care Home Management;Traction;Moist Heat;Gait training;Stair training;Functional mobility training;Therapeutic activities;Therapeutic exercise;Patient/family education;Manual techniques;Passive range of motion;Dry needling;Taping    PT Next Visit Plan  continue functional training and strength training    PT Home Exercise Plan  Access Code: [email or web address]       Patient will benefit from skilled therapeutic intervention in order to improve the following deficits and impairments:  Improper body mechanics, Pain, Decreased mobility, Postural dysfunction, Decreased activity tolerance, Decreased endurance, Decreased range of motion, Decreased strength, Hypomobility, Impaired flexibility  Visit Diagnosis: Chronic right-sided low back pain without sciatica  Muscle weakness (generalized)  Abnormal posture  Unsteadiness on feet     Problem List Patient Active Problem List    Diagnosis Date Noted  . Diabetic peripheral neuropathy associated with type 2 diabetes mellitus ([place]) 12/12/2017  . Chronic bilateral thoracic back pain 12/12/2017  . Myofascial pain syndrome 12/12/2017  . Foraminal stenosis of lumbar region 12/12/2017  . Spondylosis without myelopathy or radiculopathy, lumbar region 12/12/2017   Baruch Merl, PT 12/21/17 12:30 PM    [facility] [address] [place], [place], [address] Phone: [phone]   Fax:  [phone]  Name: Alejandro Foster MRN: [id number] Date of Birth: Aug 08, 1933

## 2017-12-24 ENCOUNTER — Encounter: Payer: Self-pay | Admitting: Physical Therapy

## 2017-12-24 ENCOUNTER — Ambulatory Visit: Payer: Medicare Other | Admitting: Physical Therapy

## 2017-12-24 DIAGNOSIS — M545 Low back pain, unspecified: Secondary | ICD-10-CM

## 2017-12-24 DIAGNOSIS — R293 Abnormal posture: Secondary | ICD-10-CM

## 2017-12-24 DIAGNOSIS — G8929 Other chronic pain: Secondary | ICD-10-CM

## 2017-12-24 DIAGNOSIS — M6281 Muscle weakness (generalized): Secondary | ICD-10-CM

## 2017-12-24 NOTE — Patient Instructions (Signed)
Access Code: [email or web address]  URL: [email or web address]  Date: 12/24/2017  Prepared by: Venetia Night Beuhring   Exercises  Supine Hamstring Stretch - 10 reps - 2 sets - 30 hold - 1x daily - 7x weekly  Supine Piriformis Stretch with Foot on Ground - 10 reps - 2 sets - 30 hold - 1x daily - 7x weekly  Hooklying Single Knee to Chest Stretch - 10 reps - 2 sets - 5 hold - 1x daily - 7x weekly  Hooklying Gluteal Sets - 10 reps - 2 sets - 10 hold - 1x daily - 7x weekly  Single Leg Stance Stabilization with Quick Front to Back Band Oscillations - Posterior Anchor - 30 reps - 3 sets - 1x daily - 7x weekly  Single Leg Stance Stabilization with Quick Front to Back Band Oscillations - Anterior Anchor - 30 reps - 3 sets - 1x daily - 7x weekly  Standing Hip Hinge with Dowel - 10 reps - 3 sets - 1x daily - 7x weekly  Sit to Stand without Arm Support - 10 reps - 3 sets - 2x daily - 7x weekly  Side Stepping with Resistance at Ankles and Counter Support - 10 reps - 2 sets - 1x daily - 7x weekly  Standing Hip Extension with Counter Support - 10 reps - 2 sets - 1x daily - 7x weekly  Standing Bilateral Shoulder Flexion Reactive Isometric with Resistance - 10 reps - 1 sets - 5 hold - 1x daily - 7x weekly  Standing Anti-Rotation Press with Anchored Resistance - 10 reps - 1 sets - 5 hold - 1x daily - 7x weekly  Shoulder Extension Reactive Isometrics with Elbow Extended - 10 reps - 1 sets - 5 hold - 1x daily - 7x weekly  Standing High Row with Resistance - 10 reps - 3 sets - 1x daily - 7x weekly  Supine Active Straight Leg Raise - 10 reps - 1 sets - 1x daily - 7x weekly  Standing Shoulder Horizontal Abduction with Resistance - 10 reps - 3 sets - 1x daily - 7x weekly  Patient Education  Rolling From Your Back to Your Side

## 2017-12-24 NOTE — Therapy (Addendum)
[facility] [address] [place], [place], [address] Phone: [phone]   Fax:  [phone]  Physical Therapy Treatment  Patient Details  Name: Alejandro Foster MRN: [id number] Date of Birth: March 26, 1933 Referring Provider (PT): Magnus Sinning, MD   Encounter Date: 12/24/2017  PT End of Session - 12/24/17 0759    Visit Number  6    Date for PT Re-Evaluation  01/30/18    Authorization Type  UHC Medicare    PT Start Time  0759    PT Stop Time  0845    PT Time Calculation (min)  46 min    Activity Tolerance  Patient tolerated treatment well    Behavior During Therapy  [facility] for tasks assessed/performed       Past Medical History:  Diagnosis Date  . Coronary artery disease    cardiologist-  dr Wynonia Lawman--  s/p  cardiac cath's w/ angioplasty and stenting x2  . DDD (degenerative disc disease), lumbar   . History of kidney stones   . Hyperlipidemia   . Hypertension   . Lumbar radiculopathy, right    right leg weakness  . Nephrolithiasis    bilateral non-obstructive per ct 01-19-2016  . Peripheral neuropathy    feet  . Right leg weakness    due to lumbar radiculopathy  . Right ureteral stone   . S/P coronary artery stent placement    2002 x1  and 2003 x1  . Type 2 diabetes mellitus ([place])   . Wears hearing aid    BILATERAL    Past Surgical History:  Procedure Laterality Date  . CARDIAC CATHETERIZATION  01/ 2002   dr Wynonia Lawman   mLAD 30%,  CFX OM3 40%,  RCA 30% previous stent site  . CARDIOVASCULAR STRESS TEST  04-15-2009  dr Wynonia Lawman   normal nuclear study w/ no ischemia/  normal LV function and wall motion , ef 77%  . CATARACT EXTRACTION W/ INTRAOCULAR LENS  IMPLANT, BILATERAL  2013  approx.  . COLONOSCOPY WITH PROPOFOL  2014  . CORONARY ANGIOPLASTY  1990  dr Wynonia Lawman   PTCA to OM2  . CORONARY ANGIOPLASTY  10/ 1996  dr Wynonia Lawman   PTCA to RCA  . CORONARY ANGIOPLASTY WITH STENT PLACEMENT  04/ 1996  dr Wynonia Lawman   stenting  to RCA  . CYSTOSCOPY WITH URETEROSCOPY AND STENT PLACEMENT Right 03/28/2016   Procedure: CYSTOSCOPY WITH RIGHT  URETEROSCOPY AND STENT PLACEMENT;  Surgeon: Festus Aloe, MD;  Location[facility];  Service: Urology;  Laterality: Right;  . EXTRACORPOREAL SHOCK WAVE LITHOTRIPSY  yrs ago  . HOLMIUM LASER APPLICATION Right 11/19/1192   Procedure: HOLMIUM LASER APPLICATION;  Surgeon: Festus Aloe, MD;  Location: [facility];  Service: Urology;  Laterality: Right;  . LAPAROSCOPIC CHOLECYSTECTOMY  1990's  . TONSILLECTOMY AND ADENOIDECTOMY  child  . URETEROLITHOTOMY  1990's    There were no vitals filed for this visit.  Subjective Assessment - 12/24/17 0800    Subjective  Pt reports dull ache in Rt low back which he notices at night when he gets in bed but it doesn't last more than 1-2 minutes.  Otherwise he had a good weekend and continued his walking program and HEP.  He is incorporating improved body mechanics at home with activities such as taking out the trash now without pain.    Diagnostic tests  MRI lumbar spine Mod chronic right L4 nerve level foraminal stenosis at L4/5. Mild multifactorial spinal stenosis  at L3/4 with up to moderate Lt lateral recess stenosis at L4 nerve    Patient Stated Goals  put shoes on, exercise routine for weight loss (belly), keep pain to stay calm    Currently in Pain?  No/denies                       [facility] Adult PT Treatment/Exercise - 12/24/17 0001      Exercises   Exercises  Lumbar;Knee/Hip;Shoulder      Lumbar Exercises: Aerobic   Tread Mill  L1 x 6'      Lumbar Exercises: Standing   Functional Squats  15 reps   2 sets bil, PT cued for maintained core   Other Standing Lumbar Exercises  sidestepping with red tband at countertop 10' Rt/Lt x 5 sets    Other Standing Lumbar Exercises  sidestepping, fwd/bwd walking trunk reactive isometrics with UE tband red 5x10 sec each direction   mini squats with bil UE  red tband pullbacks and pullforward     Lumbar Exercises: Supine   Straight Leg Raise  10 reps    Straight Leg Raises Limitations  bil      Knee/Hip Exercises: Supine   Straight Leg Raises  Strengthening;1 set;10 reps    Other Supine Knee/Hip Exercises  glut sets 10x5 sec eachs      Shoulder Exercises: Standing   Row  Strengthening;Both;15 reps;Theraband    Theraband Level (Shoulder Row)  Level 4 [facility])             PT Education - 12/24/17 0837    Education Details  [id number], progressed HEP to include postural strength through upper back, added more quad strengthening    Person(s) Educated  Patient    Methods  Explanation;Demonstration;Handout;Verbal cues;Tactile cues    Comprehension  Verbalized understanding;Verbal cues required;Tactile cues required       PT Short Term Goals - 12/19/17 0802      PT SHORT TERM GOAL #1   Title  Pt will demonstrate proper body mechanics during bed mobility for reduced pain and improved safety getting in/out of bed.    Time  2    Period  Weeks    Status  Achieved      PT SHORT TERM GOAL #2   Title  Pt will be independent in initial HEP including spinal ROM, core stabilization, LE and trunk stretches.    Time  4    Period  Weeks    Status  Achieved      PT SHORT TERM GOAL #3   Title  Pt will report 50% reduction in painful episodes during position transitions.   Pt reports 70% improvement   Time  4    Period  Weeks    Status  Achieved      PT SHORT TERM GOAL #4   Title  Pt will incorporate walking for exercise > or = 20 minutes 3x/week for aerobic health and to contribute to his goal of weight loss.   pt is walking every day x 20+ min   Time  4    Period  Weeks    Status  Achieved        PT Long Term Goals - 12/24/17 1448      PT LONG TERM GOAL #1   Title  Pt will be independent in HEP to include spine ROM, core and LE strength, flexibility and aerobic exercise to promote reduced pain, weight loss and aerobic health.  Time  8    Period  Weeks    Status  Achieved      PT LONG TERM GOAL #2   Title  Pt will gain functional trunk and hip ROM to enable him to put his shoes on without difficulty.    Time  8    Period  Weeks    Status  Achieved            Plan - 12/24/17 9030    Clinical Impression Statement  Pt is reporting much improved pain with compliance to use of proper body mechanics with bending, lifting, avoidance of combined bend/lift/twist activities, proper bed mobility, and HEP and walking program.  PT is building progressive strength program which he continues to need min-mod cueing for proper performance.  When Pt has had pain, he reports pain improves with hip strengthening exercises and core exercises.  Pt will continue to benefit from skilled progression of ther ex and lumbar stabilization for increased safety, ongoing exercise for health, and functional independence with pain control.    Rehab Potential  Good    PT Frequency  2x / week    PT Duration  8 weeks    PT Treatment/Interventions  ADLs/Self Care Home Management;Traction;Moist Heat;Gait training;Stair training;Functional mobility training;Therapeutic activities;Therapeutic exercise;Patient/family education;Manual techniques;Passive range of motion;Dry needling;Taping    PT Next Visit Plan  continue functional training and strength training    PT Home Exercise Plan  Access Code: [email or web address]    Consulted and Agree with Plan of Care  Patient       Patient will benefit from skilled therapeutic intervention in order to improve the following deficits and impairments:  Improper body mechanics, Pain, Decreased mobility, Postural dysfunction, Decreased activity tolerance, Decreased endurance, Decreased range of motion, Decreased strength, Hypomobility, Impaired flexibility  Visit Diagnosis: Chronic right-sided low back pain without sciatica  Muscle weakness (generalized)  Abnormal posture     Problem List Patient Active Problem  List   Diagnosis Date Noted  . Diabetic peripheral neuropathy associated with type 2 diabetes mellitus ([place]) 12/12/2017  . Chronic bilateral thoracic back pain 12/12/2017  . Myofascial pain syndrome 12/12/2017  . Foraminal stenosis of lumbar region 12/12/2017  . Spondylosis without myelopathy or radiculopathy, lumbar region 12/12/2017    Baruch Merl, PT 12/24/17 9:32 AM    [facility] [address], [place], [place], [address] Phone: [phone]   Fax:  [phone]  Name: MENELIK MCFARREN MRN: [id number] Date of Birth: 03-May-1933

## 2017-12-26 ENCOUNTER — Encounter: Payer: Self-pay | Admitting: Physical Therapy

## 2017-12-26 ENCOUNTER — Ambulatory Visit: Payer: Medicare Other | Admitting: Physical Therapy

## 2017-12-26 DIAGNOSIS — M6281 Muscle weakness (generalized): Secondary | ICD-10-CM

## 2017-12-26 DIAGNOSIS — R2681 Unsteadiness on feet: Secondary | ICD-10-CM

## 2017-12-26 DIAGNOSIS — R293 Abnormal posture: Secondary | ICD-10-CM

## 2017-12-26 DIAGNOSIS — M545 Low back pain: Secondary | ICD-10-CM | POA: Diagnosis not present

## 2017-12-26 DIAGNOSIS — G8929 Other chronic pain: Secondary | ICD-10-CM

## 2017-12-26 NOTE — Therapy (Signed)
[facility] [address], [place], [place], [address] Phone: [phone]   Fax:  [phone]  Physical Therapy Treatment  Patient Details  Name: Alejandro Foster MRN: [id number] Date of Birth: 1934-02-06 Referring Provider (PT): Magnus Sinning, MD   Encounter Date: 12/26/2017  PT End of Session - 12/26/17 0930    Visit Number  7    Date for PT Re-Evaluation  01/30/18    Authorization Type  [employer] Medicare    PT Start Time  0930    PT Stop Time  1013    PT Time Calculation (min)  43 min    Activity Tolerance  Patient tolerated treatment well    Behavior During Therapy  [facility] for tasks assessed/performed       Past Medical History:  Diagnosis Date  . Coronary artery disease    cardiologist-  dr Wynonia Lawman--  s/p  cardiac cath's w/ angioplasty and stenting x2  . DDD (degenerative disc disease), lumbar   . History of kidney stones   . Hyperlipidemia   . Hypertension   . Lumbar radiculopathy, right    right leg weakness  . Nephrolithiasis    bilateral non-obstructive per ct 01-19-2016  . Peripheral neuropathy    feet  . Right leg weakness    due to lumbar radiculopathy  . Right ureteral stone   . S/P coronary artery stent placement    2002 x1  and 2003 x1  . Type 2 diabetes mellitus ([place])   . Wears hearing aid    BILATERAL    Past Surgical History:  Procedure Laterality Date  . CARDIAC CATHETERIZATION  01/ 2002   dr Wynonia Lawman   mLAD 30%,  CFX OM3 40%,  RCA 30% previous stent site  . CARDIOVASCULAR STRESS TEST  04-15-2009  dr Wynonia Lawman   normal nuclear study w/ no ischemia/  normal LV function and wall motion , ef 77%  . CATARACT EXTRACTION W/ INTRAOCULAR LENS  IMPLANT, BILATERAL  2013  approx.  . COLONOSCOPY WITH PROPOFOL  2014  . CORONARY ANGIOPLASTY  1990  dr Wynonia Lawman   PTCA to OM2  . CORONARY ANGIOPLASTY  10/ 1996  dr Wynonia Lawman   PTCA to RCA  . CORONARY ANGIOPLASTY WITH STENT PLACEMENT  04/ 1996  dr Wynonia Lawman   stenting  to RCA  . CYSTOSCOPY WITH URETEROSCOPY AND STENT PLACEMENT Right 03/28/2016   Procedure: CYSTOSCOPY WITH RIGHT  URETEROSCOPY AND STENT PLACEMENT;  Surgeon: Festus Aloe, MD;  Location: [facility];  Service: Urology;  Laterality: Right;  . EXTRACORPOREAL SHOCK WAVE LITHOTRIPSY  yrs ago  . HOLMIUM LASER APPLICATION Right 0/05/4740   Procedure: HOLMIUM LASER APPLICATION;  Surgeon: Festus Aloe, MD;  Location: [facility];  Service: Urology;  Laterality: Right;  . LAPAROSCOPIC CHOLECYSTECTOMY  1990's  . TONSILLECTOMY AND ADENOIDECTOMY  child  . URETEROLITHOTOMY  1990's    There were no vitals filed for this visit.  Subjective Assessment - 12/26/17 0930    Subjective  Pt reports he has no pain today.  Had a good night and used his bed mobility techniques to get in and out of bed without pain.    Diagnostic tests  MRI lumbar spine Mod chronic right L4 nerve level foraminal stenosis at L4/5. Mild multifactorial spinal stenosis at L3/4 with up to moderate Lt lateral recess stenosis at L4 nerve    Currently in Pain?  No/denies  [place] Adult PT Treatment/Exercise - 12/26/17 0001      Exercises   Exercises  Lumbar;Knee/Hip      Lumbar Exercises: Stretches   Active Hamstring Stretch  Right;Left;2 reps;30 seconds    Piriformis Stretch  Right;Left;20 seconds;2 reps    Figure 4 Stretch  20 seconds;2 reps;With overpressure    Figure 4 Stretch Limitations  bil      Lumbar Exercises: Aerobic   Tread Mill  L1 x 6'   PT present to review goals and cue longer step length   Nustep  L2 x 6'   PT present to monitor     Lumbar Exercises: Standing   Other Standing Lumbar Exercises  stairs x 4 up/down 5 sets with Rt rail, step over step pattern      Lumbar Exercises: Seated   Long Arc Quad on Chair  Strengthening;Both;15 reps;2 sets    LAQ on Chair Weights (lbs)  2    Other Seated Lumbar Exercises  marching, bil, 2# ankle weights       Lumbar Exercises: Supine   Glut Set  10 reps   10 sec holds with UE press into physioball for ab set   Glut Set Limitations  PT provided cueing for longer hold for endurance strength    Dead Bug  15 reps    Dead Bug Limitations  bil alternating      Knee/Hip Exercises: Standing   Heel Raises  20 reps;Both    Hip Abduction  Stengthening;Both;2 sets;15 reps    Abduction Limitations  --   attempted with 2# but increased LBP, no pain without weight   Hip Extension  Stengthening;Both;15 reps    Other Standing Knee Exercises  toe raises x 20 reps    Other Standing Knee Exercises  marching toe taps on 4" riser x 30 reps               PT Short Term Goals - 12/19/17 0802      PT SHORT TERM GOAL #1   Title  Pt will demonstrate proper body mechanics during bed mobility for reduced pain and improved safety getting in/out of bed.    Time  2    Period  Weeks    Status  Achieved      PT SHORT TERM GOAL #2   Title  Pt will be independent in initial HEP including spinal ROM, core stabilization, LE and trunk stretches.    Time  4    Period  Weeks    Status  Achieved      PT SHORT TERM GOAL #3   Title  Pt will report 50% reduction in painful episodes during position transitions.   Pt reports 70% improvement   Time  4    Period  Weeks    Status  Achieved      PT SHORT TERM GOAL #4   Title  Pt will incorporate walking for exercise > or = 20 minutes 3x/week for aerobic health and to contribute to his goal of weight loss.   pt is walking every day x 20+ min   Time  4    Period  Weeks    Status  Achieved        PT Long Term Goals - 12/24/17 0539      PT LONG TERM GOAL #1   Title  Pt will be independent in HEP to include spine ROM, core and LE strength, flexibility and aerobic exercise to promote reduced pain, weight loss  and aerobic health.    Time  8    Period  Weeks    Status  Achieved      PT LONG TERM GOAL #2   Title  Pt will gain functional trunk and hip ROM to  enable him to put his shoes on without difficulty.    Time  8    Period  Weeks    Status  Achieved            Plan - 12/26/17 1009    Clinical Impression Statement  Pt with reported improved pain and function.  He is hoping to return to walking wooded trails with a walking stick soon.  He was able to perform steps with single rail with step over step pattern today which improved from step to step pattern at beginning of therapy.  He is able to perform sit to stand from standard height chair without use of UEs.  He has ongoing weakness in hip extensors, flexors, and abductors so PT focused today on further progression of challenging those muscle groups in functional patterns in both open and closed chain.  Pt will continue to benefit from skilled progression of lumbopelvic and LE strengthening for pain management and increased safety and independence during daily tasks.     PT Frequency  2x / week    PT Duration  8 weeks    PT Treatment/Interventions  ADLs/Self Care Home Management;Traction;Moist Heat;Gait training;Stair training;Functional mobility training;Therapeutic activities;Therapeutic exercise;Patient/family education;Manual techniques;Passive range of motion;Dry needling;Taping    PT Next Visit Plan  continue functional training and strength training    PT Home Exercise Plan  Access Code: [email or web address]    Consulted and Agree with Plan of Care  Patient       Patient will benefit from skilled therapeutic intervention in order to improve the following deficits and impairments:  Improper body mechanics, Pain, Decreased mobility, Postural dysfunction, Decreased activity tolerance, Decreased endurance, Decreased range of motion, Decreased strength, Hypomobility, Impaired flexibility  Visit Diagnosis: Chronic right-sided low back pain without sciatica  Muscle weakness (generalized)  Abnormal posture  Unsteadiness on feet     Problem List Patient Active Problem List   Diagnosis Date  Noted  . Diabetic peripheral neuropathy associated with type 2 diabetes mellitus ([place]) 12/12/2017  . Chronic bilateral thoracic back pain 12/12/2017  . Myofascial pain syndrome 12/12/2017  . Foraminal stenosis of lumbar region 12/12/2017  . Spondylosis without myelopathy or radiculopathy, lumbar region 12/12/2017   Baruch Merl, PT 12/26/17 10:11 AM    [facility] [address], [place], [place], [address] Phone: [phone]   Fax:  [phone]  Name: CHRYSTOPHER STANGL MRN: [id number] Date of Birth: June 15, 1933

## 2018-01-02 ENCOUNTER — Ambulatory Visit: Payer: Medicare Other | Admitting: Physical Therapy

## 2018-01-02 ENCOUNTER — Encounter: Payer: Self-pay | Admitting: Physical Therapy

## 2018-01-02 DIAGNOSIS — R293 Abnormal posture: Secondary | ICD-10-CM

## 2018-01-02 DIAGNOSIS — M545 Low back pain, unspecified: Secondary | ICD-10-CM

## 2018-01-02 DIAGNOSIS — M6281 Muscle weakness (generalized): Secondary | ICD-10-CM

## 2018-01-02 DIAGNOSIS — G8929 Other chronic pain: Secondary | ICD-10-CM

## 2018-01-02 DIAGNOSIS — R2681 Unsteadiness on feet: Secondary | ICD-10-CM

## 2018-01-02 NOTE — Therapy (Signed)
[facility] [address] [place], [place], [address] Phone: [phone]   Fax:  [phone]  Physical Therapy Treatment  Patient Details  Name: Alejandro Foster MRN: [id number] Date of Birth: Sep 01, 1933 Referring Provider (PT): Magnus Sinning, MD   Encounter Date: 01/02/2018  PT End of Session - 01/02/18 0940    Visit Number  8    Date for PT Re-Evaluation  01/30/18    Authorization Type  UHC Medicare    PT Start Time  0933    PT Stop Time  1017    PT Time Calculation (min)  44 min    Activity Tolerance  Patient tolerated treatment well    Behavior During Therapy  [facility] for tasks assessed/performed       Past Medical History:  Diagnosis Date  . Coronary artery disease    cardiologist-  dr Wynonia Lawman--  s/p  cardiac cath's w/ angioplasty and stenting x2  . DDD (degenerative disc disease), lumbar   . History of kidney stones   . Hyperlipidemia   . Hypertension   . Lumbar radiculopathy, right    right leg weakness  . Nephrolithiasis    bilateral non-obstructive per ct 01-19-2016  . Peripheral neuropathy    feet  . Right leg weakness    due to lumbar radiculopathy  . Right ureteral stone   . S/P coronary artery stent placement    2002 x1  and 2003 x1  . Type 2 diabetes mellitus ([place])   . Wears hearing aid    BILATERAL    Past Surgical History:  Procedure Laterality Date  . CARDIAC CATHETERIZATION  01/ 2002   dr Wynonia Lawman   mLAD 30%,  CFX OM3 40%,  RCA 30% previous stent site  . CARDIOVASCULAR STRESS TEST  04-15-2009  dr Wynonia Lawman   normal nuclear study w/ no ischemia/  normal LV function and wall motion , ef 77%  . CATARACT EXTRACTION W/ INTRAOCULAR LENS  IMPLANT, BILATERAL  2013  approx.  . COLONOSCOPY WITH PROPOFOL  2014  . CORONARY ANGIOPLASTY  1990  dr Wynonia Lawman   PTCA to OM2  . CORONARY ANGIOPLASTY  10/ 1996  dr Wynonia Lawman   PTCA to RCA  . CORONARY ANGIOPLASTY WITH STENT PLACEMENT  04/ 1996  dr Wynonia Lawman    stenting to RCA  . CYSTOSCOPY WITH URETEROSCOPY AND STENT PLACEMENT Right 03/28/2016   Procedure: CYSTOSCOPY WITH RIGHT  URETEROSCOPY AND STENT PLACEMENT;  Surgeon: Festus Aloe, MD;  Location: [facility];  Service: Urology;  Laterality: Right;  . EXTRACORPOREAL SHOCK WAVE LITHOTRIPSY  yrs ago  . HOLMIUM LASER APPLICATION Right 03/24/4006   Procedure: HOLMIUM LASER APPLICATION;  Surgeon: Festus Aloe, MD;  Location: [facility];  Service: Urology;  Laterality: Right;  . LAPAROSCOPIC CHOLECYSTECTOMY  1990's  . TONSILLECTOMY AND ADENOIDECTOMY  child  . URETEROLITHOTOMY  1990's    There were no vitals filed for this visit.  Subjective Assessment - 01/02/18 0935    Subjective  Pt woke up with a Rt low back catch this morning which he hasn't had in a long time.  He went to the dentist yesterday and had pain laying back in the chair which may have caused his morning pain.  He states it feels better now that he is up and moving.    Limitations  Sitting    How long can you sit comfortably?  1 hour    Diagnostic tests  MRI lumbar spine Mod chronic  right L4 nerve level foraminal stenosis at L4/5. Mild multifactorial spinal stenosis at L3/4 with up to moderate Lt lateral recess stenosis at L4 nerve    Currently in Pain?  Yes   was 3-4 on waking this morning but has improved since getting up   Pain Score  1     Pain Location  Back    Pain Orientation  Right    Pain Descriptors / Indicators  --   catching   Pain Type  Chronic pain    Pain Onset  More than a month ago                       [facility] Adult PT Treatment/Exercise - 01/02/18 0001      Ambulation/Gait   Ambulation/Gait  --      Exercises   Exercises  Lumbar;Knee/Hip      Lumbar Exercises: Stretches   Active Hamstring Stretch  Right;Left;2 reps;20 seconds    Single Knee to Chest Stretch  Left;Right;5 reps;10 seconds    Piriformis Stretch  Right;Left;20 seconds;2 reps    Figure 4  Stretch  20 seconds;2 reps;With overpressure    Figure 4 Stretch Limitations  bil    Other Lumbar Stretch Exercise  seated physioball rollouts x 15 reps      Lumbar Exercises: Aerobic   Tread Mill  L1 x 8', speed increasing from 1.5 to 1.40mph   PT present to review symptoms, goals     Knee/Hip Exercises: Standing   Rebounder  stagger stance Lt/Rt/front/back x 1' each x 2 sets, marching x 1' x 2 sets     Gait Training  stepping over objects 2" tall   PT cued for increased hip flexion and ankle DF to clear   Other Standing Knee Exercises  marching on foam x 1'               PT Short Term Goals - 12/19/17 0802      PT SHORT TERM GOAL #1   Title  Pt will demonstrate proper body mechanics during bed mobility for reduced pain and improved safety getting in/out of bed.    Time  2    Period  Weeks    Status  Achieved      PT SHORT TERM GOAL #2   Title  Pt will be independent in initial HEP including spinal ROM, core stabilization, LE and trunk stretches.    Time  4    Period  Weeks    Status  Achieved      PT SHORT TERM GOAL #3   Title  Pt will report 50% reduction in painful episodes during position transitions.   Pt reports 70% improvement   Time  4    Period  Weeks    Status  Achieved      PT SHORT TERM GOAL #4   Title  Pt will incorporate walking for exercise > or = 20 minutes 3x/week for aerobic health and to contribute to his goal of weight loss.   pt is walking every day x 20+ min   Time  4    Period  Weeks    Status  Achieved        PT Long Term Goals - 12/24/17 8119      PT LONG TERM GOAL #1   Title  Pt will be independent in HEP to include spine ROM, core and LE strength, flexibility and aerobic exercise to promote reduced pain, weight loss  and aerobic health.    Time  8    Period  Weeks    Status  Achieved      PT LONG TERM GOAL #2   Title  Pt will gain functional trunk and hip ROM to enable him to put his shoes on without difficulty.    Time  8     Period  Weeks    Status  Achieved            Plan - 01/02/18 1104    Clinical Impression Statement  Pt reported mild Rt LBP on waking this morning which began after being in a dentist chair yesterday.  Pain improved with walking warm up and LE stretching.  Pt has a goal of walking on unpaved wooded paths to add variety to his aerobic walking program so PT focused on uneven surface stepping, weight shifts and marching to simulate uneven ground.  He needed cueing to increase hip flexion and ankle DF to ensure clearing obstacles.  Pt demonstrated good performance of going upstairs with one railing today but needed cueing on use of eccentric quads when descending to avoid heavy landing on feet.  He was able to perform this with PT demo and VC.  He will continue to benefit from skilled PT to advance his strength, balance and gait challenges to improve safety and independence in the community.    Rehab Potential  Good    PT Frequency  2x / week    PT Duration  8 weeks    PT Treatment/Interventions  ADLs/Self Care Home Management;Traction;Moist Heat;Gait training;Stair training;Functional mobility training;Therapeutic activities;Therapeutic exercise;Patient/family education;Manual techniques;Passive range of motion;Dry needling;Taping    PT Next Visit Plan  continue functional training and strength training, balance training, eccentric control with descending stairs    PT Home Exercise Plan  Access Code: [email or web address]    Consulted and Agree with Plan of Care  Patient       Patient will benefit from skilled therapeutic intervention in order to improve the following deficits and impairments:  Improper body mechanics, Pain, Decreased mobility, Postural dysfunction, Decreased activity tolerance, Decreased endurance, Decreased range of motion, Decreased strength, Hypomobility, Impaired flexibility  Visit Diagnosis: Chronic right-sided low back pain without sciatica  Muscle weakness  (generalized)  Unsteadiness on feet  Abnormal posture     Problem List Patient Active Problem List   Diagnosis Date Noted  . Diabetic peripheral neuropathy associated with type 2 diabetes mellitus ([place]) 12/12/2017  . Chronic bilateral thoracic back pain 12/12/2017  . Myofascial pain syndrome 12/12/2017  . Foraminal stenosis of lumbar region 12/12/2017  . Spondylosis without myelopathy or radiculopathy, lumbar region 12/12/2017   Baruch Merl, PT 01/02/18 11:10 AM    [facility] [address], [place], [place], [address] Phone: [phone]   Fax:  [phone]  Name: Alejandro Foster MRN: [id number] Date of Birth: 1934-01-07

## 2018-01-04 ENCOUNTER — Ambulatory Visit: Payer: Medicare Other | Admitting: Physical Therapy

## 2018-01-04 ENCOUNTER — Encounter: Payer: Self-pay | Admitting: Physical Therapy

## 2018-01-04 DIAGNOSIS — M6281 Muscle weakness (generalized): Secondary | ICD-10-CM

## 2018-01-04 DIAGNOSIS — R2681 Unsteadiness on feet: Secondary | ICD-10-CM

## 2018-01-04 DIAGNOSIS — G8929 Other chronic pain: Secondary | ICD-10-CM

## 2018-01-04 DIAGNOSIS — R2689 Other abnormalities of gait and mobility: Secondary | ICD-10-CM

## 2018-01-04 DIAGNOSIS — M545 Low back pain: Secondary | ICD-10-CM | POA: Diagnosis not present

## 2018-01-04 DIAGNOSIS — R293 Abnormal posture: Secondary | ICD-10-CM

## 2018-01-04 NOTE — Therapy (Addendum)
[facility] [address] [place], [place], [address] Phone: [phone]   Fax:  [phone]  Physical Therapy Treatment  Patient Details  Name: Alejandro Foster MRN: [id number] Date of Birth: 10-03-1933 Referring Provider (PT): Magnus Sinning, MD   Encounter Date: 01/04/2018  PT End of Session - 01/04/18 0847    Visit Number  9    Date for PT Re-Evaluation  01/30/18    Authorization Type  [employer] Medicare    PT Start Time  2482    PT Stop Time  0932    PT Time Calculation (min)  45 min    Activity Tolerance  Patient tolerated treatment well    Behavior During Therapy  [facility] for tasks assessed/performed       Past Medical History:  Diagnosis Date  . Coronary artery disease    cardiologist-  dr Wynonia Lawman--  s/p  cardiac cath's w/ angioplasty and stenting x2  . DDD (degenerative disc disease), lumbar   . History of kidney stones   . Hyperlipidemia   . Hypertension   . Lumbar radiculopathy, right    right leg weakness  . Nephrolithiasis    bilateral non-obstructive per ct 01-19-2016  . Peripheral neuropathy    feet  . Right leg weakness    due to lumbar radiculopathy  . Right ureteral stone   . S/P coronary artery stent placement    2002 x1  and 2003 x1  . Type 2 diabetes mellitus ([place])   . Wears hearing aid    BILATERAL    Past Surgical History:  Procedure Laterality Date  . CARDIAC CATHETERIZATION  01/ 2002   dr Wynonia Lawman   mLAD 30%,  CFX OM3 40%,  RCA 30% previous stent site  . CARDIOVASCULAR STRESS TEST  04-15-2009  dr Wynonia Lawman   normal nuclear study w/ no ischemia/  normal LV function and wall motion , ef 77%  . CATARACT EXTRACTION W/ INTRAOCULAR LENS  IMPLANT, BILATERAL  2013  approx.  . COLONOSCOPY WITH PROPOFOL  2014  . CORONARY ANGIOPLASTY  1990  dr Wynonia Lawman   PTCA to OM2  . CORONARY ANGIOPLASTY  10/ 1996  dr Wynonia Lawman   PTCA to RCA  . CORONARY ANGIOPLASTY WITH STENT PLACEMENT  04/ 1996  dr Wynonia Lawman   stenting to RCA  . CYSTOSCOPY WITH URETEROSCOPY AND STENT PLACEMENT Right 03/28/2016   Procedure: CYSTOSCOPY WITH RIGHT  URETEROSCOPY AND STENT PLACEMENT;  Surgeon: Festus Aloe, MD;  Location: [facility];  Service: Urology;  Laterality: Right;  . EXTRACORPOREAL SHOCK WAVE LITHOTRIPSY  yrs ago  . HOLMIUM LASER APPLICATION Right 5/0/0370   Procedure: HOLMIUM LASER APPLICATION;  Surgeon: Festus Aloe, MD;  Location[facility];  Service: Urology;  Laterality: Right;  . LAPAROSCOPIC CHOLECYSTECTOMY  1990's  . TONSILLECTOMY AND ADENOIDECTOMY  child  . URETEROLITHOTOMY  1990's    There were no vitals filed for this visit.  Subjective Assessment - 01/04/18 0851    Subjective  Moving/walking seems to be the best thing to do. I have to watch sitting too long.    Currently in Pain?  Yes    Pain Score  1     Pain Location  Back    Pain Orientation  Right    Pain Descriptors / Indicators  Tightness    Aggravating Factors   Not moving    Pain Relieving Factors  Moving    Multiple Pain Sites  No  [place] Adult PT Treatment/Exercise - 01/04/18 0001      Lumbar Exercises: Stretches   Active Hamstring Stretch  Right;Left;2 reps;20 seconds    Single Knee to Chest Stretch  Right;Left;2 reps;20 seconds    Figure 4 Stretch  2 reps;20 seconds      Lumbar Exercises: Aerobic   Tread Mill  1.81mh x 10 min   PTA present for safety   Nustep  L2 x 8 min   PTA present to discuss status     Lumbar Exercises: Standing   Other Standing Lumbar Exercises  forwards step ups 10x bil, step downs 8x bil   Pain on RTLB during RTLE step downs   Other Standing Lumbar Exercises  abduction 2x10 0#   marching down length of counter 4x     Lumbar Exercises: Seated   Long Arc Quad on Chair  Strengthening;Both;2 sets;10 reps;Weights    LAQ on Chair Weights (lbs)  3      Lumbar Exercises: Supine   Dead Bug  10 reps;4 seconds   clamshell 10x  with TA activation, single LE bicylce 6x Bil              PT Short Term Goals - 12/19/17 0802      PT SHORT TERM GOAL #1   Title  Pt will demonstrate proper body mechanics during bed mobility for reduced pain and improved safety getting in/out of bed.    Time  2    Period  Weeks    Status  Achieved      PT SHORT TERM GOAL #2   Title  Pt will be independent in initial HEP including spinal ROM, core stabilization, LE and trunk stretches.    Time  4    Period  Weeks    Status  Achieved      PT SHORT TERM GOAL #3   Title  Pt will report 50% reduction in painful episodes during position transitions.   Pt reports 70% improvement   Time  4    Period  Weeks    Status  Achieved      PT SHORT TERM GOAL #4   Title  Pt will incorporate walking for exercise > or = 20 minutes 3x/week for aerobic health and to contribute to his goal of weight loss.   pt is walking every day x 20+ min   Time  4    Period  Weeks    Status  Achieved        PT Long Term Goals - 12/24/17 00722     PT LONG TERM GOAL #1   Title  Pt will be independent in HEP to include spine ROM, core and LE strength, flexibility and aerobic exercise to promote reduced pain, weight loss and aerobic health.    Time  8    Period  Weeks    Status  Achieved      PT LONG TERM GOAL #2   Title  Pt will gain functional trunk and hip ROM to enable him to put his shoes on without difficulty.    Time  8    Period  Weeks    Status  Achieved            Plan - 01/04/18 0853    Clinical Impression Statement  Pt reports today he feels 80% better since initial evaluation. Pt appears to have good handle on core activation and his flexibility exercises. Pain was reproduced with RTLE step downs today and  could not complete 10 reps. There was no pain on the LT.  Pt also increased his walking to 10 min.     Rehab Potential  Good    PT Frequency  2x / week    PT Duration  8 weeks    PT Treatment/Interventions  ADLs/Self Care  Home Management;Traction;Moist Heat;Gait training;Stair training;Functional mobility training;Therapeutic activities;Therapeutic exercise;Patient/family education;Manual techniques;Passive range of motion;Dry needling;Taping    PT Next Visit Plan  continue functional training and strength training, balance training, eccentric control with descending stairs    PT Home Exercise Plan  Access Code: [email or web address]    Consulted and Agree with Plan of Care  Patient       Patient will benefit from skilled therapeutic intervention in order to improve the following deficits and impairments:  Improper body mechanics, Pain, Decreased mobility, Postural dysfunction, Decreased activity tolerance, Decreased endurance, Decreased range of motion, Decreased strength, Hypomobility, Impaired flexibility  Visit Diagnosis: Chronic right-sided low back pain without sciatica  Muscle weakness (generalized)  Unsteadiness on feet  Abnormal posture  Other abnormalities of gait and mobility     Problem List Patient Active Problem List   Diagnosis Date Noted  . Diabetic peripheral neuropathy associated with type 2 diabetes mellitus ([place]) 12/12/2017  . Chronic bilateral thoracic back pain 12/12/2017  . Myofascial pain syndrome 12/12/2017  . Foraminal stenosis of lumbar region 12/12/2017  . Spondylosis without myelopathy or radiculopathy, lumbar region 12/12/2017    Marcedes Tech, PTA 01/04/2018, 9:27 AM   Baruch Merl, PT 01/28/18 6:49 AM  PHYSICAL THERAPY DISCHARGE SUMMARY  Visits from Start of Care: 9  Current functional level related to goals / functional outcomes: Pt last reported 80% improvement in symptoms with PT and had met goals.  Anticipated D/C at 10th visit but Pt cancelled this appointment and did not reschedule.    Remaining deficits: See above.   Education / Equipment: HEP Plan: Patient agrees to discharge.  Patient goals were met. Patient is being discharged due to not  returning since the last visit.  ?????       [facility] [address] [place], [place], [address] Phone: [phone]   Fax:  [phone]  Name: Alejandro Foster MRN: [id number] Date of Birth: [birth year]

## 2018-01-16 ENCOUNTER — Encounter: Payer: Medicare Other | Admitting: Physical Therapy

## 2018-01-25 ENCOUNTER — Encounter: Payer: Self-pay | Admitting: Cardiology

## 2018-01-25 ENCOUNTER — Ambulatory Visit: Payer: Medicare Other | Admitting: Cardiology

## 2018-01-25 VITALS — BP 114/58 | HR 66 | Ht 67.0 in | Wt 186.1 lb

## 2018-01-25 DIAGNOSIS — E1142 Type 2 diabetes mellitus with diabetic polyneuropathy: Secondary | ICD-10-CM | POA: Diagnosis not present

## 2018-01-25 DIAGNOSIS — E785 Hyperlipidemia, unspecified: Secondary | ICD-10-CM | POA: Insufficient documentation

## 2018-01-25 DIAGNOSIS — I2510 Atherosclerotic heart disease of native coronary artery without angina pectoris: Secondary | ICD-10-CM | POA: Insufficient documentation

## 2018-01-25 HISTORY — DX: Hyperlipidemia, unspecified: E78.5

## 2018-01-25 NOTE — Progress Notes (Signed)
Cardiology Office Note:    Date:  01/25/2018   ID:  Alejandro Foster, DOB 17-Apr-1933, MRN [id number]  PCP:  Shon Baton, MD  Cardiologist:  Jenne Campus, MD    Referring MD: Shon Baton, MD   Chief Complaint  Patient presents with  . Follow-up  I am doing well  History of Present Illness:    Alejandro Foster is a 84 y.o. male with quite complex past medical history that include long-standing hypertension diabetes, dyslipidemia, coronary artery disease PTCA and stenting done in 1996.  Overall doing very well in the matter-of-fact just came back from [place] thinks he was there for 4 days was able to walk climb with no major difficulties he did have some shortness of breath but no chest pain tightness squeezing pressure burning chest.  He was at the altitude [place] 6000 [place]. Described to have some swelling of lower extremities at evening time.  Apparently diabetes is early control his last A1c was 7.4 in December.  He does not smoke trying to be active try to exercise.  Used to have a problem with the back but started taking some CBD oil and now he does not have any pain in the back.  Past Medical History:  Diagnosis Date  . Coronary artery disease    cardiologist-  dr Wynonia Lawman--  s/p  cardiac cath's w/ angioplasty and stenting x2  . DDD (degenerative disc disease), lumbar   . History of kidney stones   . Hyperlipidemia   . Hypertension   . Lumbar radiculopathy, right    right leg weakness  . Nephrolithiasis    bilateral non-obstructive per ct 01-19-2016  . Peripheral neuropathy    feet  . Right leg weakness    due to lumbar radiculopathy  . Right ureteral stone   . S/P coronary artery stent placement    2002 x1  and 2003 x1  . Type 2 diabetes mellitus ([place])   . Wears hearing aid    BILATERAL    Past Surgical History:  Procedure Laterality Date  . CARDIAC CATHETERIZATION  01/ 2002   dr Wynonia Lawman   mLAD 30%,  CFX OM3 40%,  RCA 30% previous stent site  . CARDIOVASCULAR STRESS TEST   04-15-2009  dr Wynonia Lawman   normal nuclear study w/ no ischemia/  normal LV function and wall motion , ef 77%  . CATARACT EXTRACTION W/ INTRAOCULAR LENS  IMPLANT, BILATERAL  2013  approx.  . COLONOSCOPY WITH PROPOFOL  2014  . CORONARY ANGIOPLASTY  1990  dr Wynonia Lawman   PTCA to OM2  . CORONARY ANGIOPLASTY  10/ 1996  dr Wynonia Lawman   PTCA to RCA  . CORONARY ANGIOPLASTY WITH STENT PLACEMENT  04/ 1996  dr Wynonia Lawman   stenting to RCA  . CYSTOSCOPY WITH URETEROSCOPY AND STENT PLACEMENT Right 03/28/2016   Procedure: CYSTOSCOPY WITH RIGHT  URETEROSCOPY AND STENT PLACEMENT;  Surgeon: Festus Aloe, MD;  Location: [facility];  Service: Urology;  Laterality: Right;  . EXTRACORPOREAL SHOCK WAVE LITHOTRIPSY  yrs ago  . HOLMIUM LASER APPLICATION Right 08/19/9507   Procedure: HOLMIUM LASER APPLICATION;  Surgeon: Festus Aloe, MD;  Location: [facility];  Service: Urology;  Laterality: Right;  . LAPAROSCOPIC CHOLECYSTECTOMY  1990's  . TONSILLECTOMY AND ADENOIDECTOMY  child  . URETEROLITHOTOMY  1990's    Current Medications: Current Meds  Medication Sig  . alendronate (FOSAMAX) 70 MG tablet Take 70 mg by mouth every Monday. Take with a full glass of water  on an empty stomach.  Marland Kitchen amLODipine (NORVASC) 5 MG tablet Take 5 mg by mouth every evening.   Marland Kitchen aspirin EC 81 MG tablet Take 81 mg by mouth daily.  Marland Kitchen atorvastatin (LIPITOR) 20 MG tablet Take 20 mg by mouth every evening.   . Calcium Citrate 250 MG TABS Take 250 mg by mouth daily.  . cetirizine (ZYRTEC) 10 MG tablet Take 10 mg by mouth daily as needed.   . Cholecalciferol (VITAMIN D PO) Take 5 drops by mouth every evening.   . fenofibrate (TRICOR) 48 MG tablet Take 48 mg by mouth every evening.   . gabapentin (NEURONTIN) 100 MG capsule Take 100-200 mg by mouth 2 (two) times daily. Take 100mg  at noon and then 200mg  at night  . glipiZIDE-metformin (METAGLIP) 2.5-500 MG per tablet Take 2 tablets by mouth 2 (two) times daily before a  meal.   . HUMALOG MIX 75/25 KWIKPEN (75-25) 100 UNIT/ML Kwikpen Inject 30-35 Units into the skin 2 (two) times daily. 35 units in the morning and then 30 units in the evening.  . hydrochlorothiazide (HYDRODIURIL) 25 MG tablet Take 25 mg by mouth every morning.   Marland Kitchen ibuprofen (ADVIL,MOTRIN) 200 MG tablet Take 200 mg by mouth every 6 (six) hours as needed for headache or moderate pain.   . Magnesium Citrate 125 MG CAPS Take 150 mg by mouth daily.  . metoprolol tartrate (LOPRESSOR) 25 MG tablet Take 25 mg by mouth every morning.   . ramipril (ALTACE) 10 MG capsule Take 20 mg by mouth daily.   . ranitidine (ZANTAC) 150 MG tablet Take 150 mg by mouth at bedtime.   . solifenacin (VESICARE) 5 MG tablet Take 5 mg by mouth every morning.   . zolpidem (AMBIEN) 10 MG tablet Take 10 mg by mouth at bedtime.     Allergies:   Sulfa antibiotics   Social History   Socioeconomic History  . Marital status: Married    Spouse name: Not on file  . Number of children: Not on file  . Years of education: Not on file  . Highest education level: Not on file  Occupational History  . Not on file  Social Needs  . Financial resource strain: Not on file  . Food insecurity:    Worry: Not on file    Inability: Not on file  . Transportation needs:    Medical: Not on file    Non-medical: Not on file  Tobacco Use  . Smoking status: Former Smoker    Years: 40.00    Types: Cigarettes    Last attempt to quit: 08/26/1988    Years since quitting: 29.4  . Smokeless tobacco: Never Used  Substance and Sexual Activity  . Alcohol use: Yes    Comment: rare  . Drug use: No  . Sexual activity: Not on file  Lifestyle  . Physical activity:    Days per week: Not on file    Minutes per session: Not on file  . Stress: Not on file  Relationships  . Social connections:    Talks on phone: Not on file    Gets together: Not on file    Attends religious service: Not on file    Active member of club or organization: Not on  file    Attends meetings of clubs or organizations: Not on file    Relationship status: Not on file  Other Topics Concern  . Not on file  Social History Narrative  . Not on file  Family History: The patient's family history is not on file. ROS:   Please see the history of present illness.    All 14 point review of systems negative except as described per history of present illness  EKGs/Labs/Other Studies Reviewed:      Recent Labs: No results found for requested labs within last 8760 hours.  Recent Lipid Panel No results found for: CHOL, TRIG, HDL, CHOLHDL, VLDL, LDLCALC, LDLDIRECT  Physical Exam:    VS:  BP (!) 114/58   Pulse 66   Ht 5\' 7"  (1.702 m)   Wt 186 lb 1.9 oz (84.4 kg)   SpO2 98%   BMI 29.15 kg/m     Wt Readings from Last 3 Encounters:  01/25/18 186 lb 1.9 oz (84.4 kg)  03/28/16 178 lb (80.7 kg)  08/26/13 178 lb (80.7 kg)     GEN:  Well nourished, well developed in no acute distress HEENT: Normal NECK: No JVD; No carotid bruits LYMPHATICS: No lymphadenopathy CARDIAC: RRR, no murmurs, no rubs, no gallops RESPIRATORY:  Clear to auscultation without rales, wheezing or rhonchi  ABDOMEN: Soft, non-tender, non-distended MUSCULOSKELETAL:  No edema; No deformity  SKIN: Warm and dry LOWER EXTREMITIES: no swelling NEUROLOGIC:  Alert and oriented x 3 PSYCHIATRIC:  Normal affect   ASSESSMENT:    1. Diabetic peripheral neuropathy associated with type 2 diabetes mellitus ([place])   2. Coronary artery disease involving native coronary artery of native heart without angina pectoris   3. Dyslipidemia    PLAN:    In order of problems listed above:  1. Coronary artery disease status post stenting long time ago appears to be asymptomatic [place] walk and no difficulties did have some shortness of breath though.  Also described to have some swelling of lower extremities the evening time.  I will ask him to have  echocardiogram to check left ventricular ejection fraction but I do not think with dealing with any reactivation of coronary artery disease problem.  Prior symptoms included chest pain tightness and pain going to the neck when he was having coronary events. 2. Dyslipidemia on moderate intensity statin with excellent control cholesterol. 3. Diabetes with last hemoglobin A1c 7.5.  Overall he is doing well we will see him back in 6 months sooner if he get a problem   Medication Adjustments/Labs and Tests Ordered: Current medicines are reviewed at length with the patient today.  Concerns regarding medicines are outlined above.  No orders of the defined types were placed in this encounter.  Medication changes: No orders of the defined types were placed in this encounter.   Signed, Alejandro Liter, MD, [facility] 01/25/2018 11:24 AM    [place]

## 2018-01-25 NOTE — Patient Instructions (Signed)
Medication Instructions:  Your physician recommends that you continue on your current medications as directed. Please refer to the Current Medication list given to you today.  If you need a refill on your cardiac medications before your next appointment, please call your pharmacy.   Lab work: None.   If you have labs (blood work) drawn today and your tests are completely normal, you will receive your results only by: . MyChart Message (if you have MyChart) OR . A paper copy in the mail If you have any lab test that is abnormal or we need to change your treatment, we will call you to review the results.  Testing/Procedures: Your physician has requested that you have an echocardiogram. Echocardiography is a painless test that uses sound waves to create images of your heart. It provides your doctor with information about the size and shape of your heart and how well your heart's chambers and valves are working. This procedure takes approximately one hour. There are no restrictions for this procedure.    Follow-Up: At [facility], you and your health needs are our priority.  As part of our continuing mission to provide you with exceptional heart care, we have created designated Provider Care Teams.  These Care Teams include your primary Cardiologist (physician) and Advanced Practice Providers (APPs -  Physician Assistants and Nurse Practitioners) who all work together to provide you with the care you need, when you need it. You will need a follow up appointment in 6 months.  Please call our office 2 months in advance to schedule this appointment.  You may see No primary care provider on file. or another member of our [facility] Provider Team in [place]: Brian Munley, MD . Rajan Revankar, MD  Any Other Special Instructions Will Be Listed Below (If Applicable).  Echocardiogram An echocardiogram, or echocardiography, uses sound waves (ultrasound) to produce an image of your heart. The  echocardiogram is simple, painless, obtained within a short period of time, and offers valuable information to your health care provider. The images from an echocardiogram can provide information such as:  Evidence of coronary artery disease (CAD).  Heart size.  Heart muscle function.  Heart valve function.  Aneurysm detection.  Evidence of a past heart attack.  Fluid buildup around the heart.  Heart muscle thickening.  Assess heart valve function.  Tell a health care provider about:  Any allergies you have.  All medicines you are taking, including vitamins, herbs, eye drops, creams, and over-the-counter medicines.  Any problems you or family members have had with anesthetic medicines.  Any blood disorders you have.  Any surgeries you have had.  Any medical conditions you have.  Whether you are pregnant or may be pregnant. What happens before the procedure? No special preparation is needed. Eat and drink normally. What happens during the procedure?  In order to produce an image of your heart, gel will be applied to your chest and a wand-like tool (transducer) will be moved over your chest. The gel will help transmit the sound waves from the transducer. The sound waves will harmlessly bounce off your heart to allow the heart images to be captured in real-time motion. These images will then be recorded.  You may need an IV to receive a medicine that improves the quality of the pictures. What happens after the procedure? You may return to your normal schedule including diet, activities, and medicines, unless your health care provider tells you otherwise. This information is not intended to   replace advice given to you by your health care provider. Make sure you discuss any questions you have with your health care provider. Document Released: 03/03/2000 Document Revised: 10/23/2015 Document Reviewed: 11/11/2012 Elsevier Interactive Patient Education  2017 Elsevier  Inc.    

## 2018-01-28 NOTE — Progress Notes (Signed)
Thanks

## 2018-02-06 ENCOUNTER — Ambulatory Visit (HOSPITAL_BASED_OUTPATIENT_CLINIC_OR_DEPARTMENT_OTHER)
Admission: RE | Admit: 2018-02-06 | Discharge: 2018-02-06 | Disposition: A | Discharge status: Home / Self Care | Payer: Medicare Other | Source: Ambulatory Visit | Attending: Cardiology | Admitting: Cardiology

## 2018-02-06 DIAGNOSIS — I2510 Atherosclerotic heart disease of native coronary artery without angina pectoris: Secondary | ICD-10-CM | POA: Diagnosis present

## 2018-02-06 NOTE — Progress Notes (Signed)
  Echocardiogram 2D Echocardiogram has been performed.  Alejandro Foster T Haruto Demaria 02/06/2018, 11:52 AM

## 2018-05-28 DIAGNOSIS — M6530 Trigger finger, unspecified finger: Secondary | ICD-10-CM

## 2018-05-28 HISTORY — DX: Trigger finger, unspecified finger: M65.30

## 2019-02-18 ENCOUNTER — Ambulatory Visit (INDEPENDENT_AMBULATORY_CARE_PROVIDER_SITE_OTHER): Payer: Medicare Other | Admitting: Cardiology

## 2019-02-18 ENCOUNTER — Other Ambulatory Visit: Payer: Self-pay

## 2019-02-18 ENCOUNTER — Encounter: Payer: Self-pay | Admitting: Cardiology

## 2019-02-18 VITALS — BP 144/64 | HR 72 | Ht 68.0 in | Wt 182.0 lb

## 2019-02-18 DIAGNOSIS — E785 Hyperlipidemia, unspecified: Secondary | ICD-10-CM

## 2019-02-18 DIAGNOSIS — I2510 Atherosclerotic heart disease of native coronary artery without angina pectoris: Secondary | ICD-10-CM | POA: Diagnosis not present

## 2019-02-18 DIAGNOSIS — E1142 Type 2 diabetes mellitus with diabetic polyneuropathy: Secondary | ICD-10-CM | POA: Diagnosis not present

## 2019-02-18 NOTE — Patient Instructions (Signed)
Medication Instructions:  Your physician recommends that you continue on your current medications as directed. Please refer to the Current Medication list given to you today.  *If you need a refill on your cardiac medications before your next appointment, please call your pharmacy*  Lab Work: None.  If you have labs (blood work) drawn today and your tests are completely normal, you will receive your results only by: . MyChart Message (if you have MyChart) OR . A paper copy in the mail If you have any lab test that is abnormal or we need to change your treatment, we will call you to review the results.  Testing/Procedures: Your physician has requested that you have an echocardiogram. Echocardiography is a painless test that uses sound waves to create images of your heart. It provides your doctor with information about the size and shape of your heart and how well your heart's chambers and valves are working. This procedure takes approximately one hour. There are no restrictions for this procedure.     Follow-Up: At [facility], you and your health needs are our priority.  As part of our continuing mission to provide you with exceptional heart care, we have created designated Provider Care Teams.  These Care Teams include your primary Cardiologist (physician) and Advanced Practice Providers (APPs -  Physician Assistants and Nurse Practitioners) who all work together to provide you with the care you need, when you need it.  Your next appointment:   6 months  The format for your next appointment:   In Person  Provider:   You may see Dr. Krasowski or the following Advanced Practice Provider on your designated Care Team:    Caitlin Walker, FNP   Other Instructions   Echocardiogram An echocardiogram is a procedure that uses painless sound waves (ultrasound) to produce an image of the heart. Images from an echocardiogram can provide important information about:  Signs of coronary  artery disease (CAD).  Aneurysm detection. An aneurysm is a weak or damaged part of an artery wall that bulges out from the normal force of blood pumping through the body.  Heart size and shape. Changes in the size or shape of the heart can be associated with certain conditions, including heart failure, aneurysm, and CAD.  Heart muscle function.  Heart valve function.  Signs of a past heart attack.  Fluid buildup around the heart.  Thickening of the heart muscle.  A tumor or infectious growth around the heart valves. Tell a health care provider about:  Any allergies you have.  All medicines you are taking, including vitamins, herbs, eye drops, creams, and over-the-counter medicines.  Any blood disorders you have.  Any surgeries you have had.  Any medical conditions you have.  Whether you are pregnant or may be pregnant. What are the risks? Generally, this is a safe procedure. However, problems may occur, including:  Allergic reaction to dye (contrast) that may be used during the procedure. What happens before the procedure? No specific preparation is needed. You may eat and drink normally. What happens during the procedure?   An IV tube may be inserted into one of your veins.  You may receive contrast through this tube. A contrast is an injection that improves the quality of the pictures from your heart.  A gel will be applied to your chest.  A wand-like tool (transducer) will be moved over your chest. The gel will help to transmit the sound waves from the transducer.  The sound waves will harmlessly   bounce off of your heart to allow the heart images to be captured in real-time motion. The images will be recorded on a computer. The procedure may vary among health care providers and hospitals. What happens after the procedure?  You may return to your normal, everyday life, including diet, activities, and medicines, unless your health care provider tells you not to do  that. Summary  An echocardiogram is a procedure that uses painless sound waves (ultrasound) to produce an image of the heart.  Images from an echocardiogram can provide important information about the size and shape of your heart, heart muscle function, heart valve function, and fluid buildup around your heart.  You do not need to do anything to prepare before this procedure. You may eat and drink normally.  After the echocardiogram is completed, you may return to your normal, everyday life, unless your health care provider tells you not to do that. This information is not intended to replace advice given to you by your health care provider. Make sure you discuss any questions you have with your health care provider. Document Released: 03/03/2000 Document Revised: 06/27/2018 Document Reviewed: 04/08/2016 Elsevier Patient Education  2020 Elsevier Inc.   

## 2019-02-18 NOTE — Addendum Note (Signed)
Addended by: Ashok Norris on: 02/18/2019 11:47 AM   Modules accepted: Orders

## 2019-02-18 NOTE — Progress Notes (Signed)
Cardiology Office Note:    Date:  02/18/2019   ID:  [name], DOB 29-Sep-1933, MRN [id number]  PCP:  Shon Baton, MD  Cardiologist:  Jenne Campus, MD    Referring MD: Shon Baton, MD   Chief Complaint  Patient presents with  . Follow-up  Short of breath while walking  History of Present Illness:    TSUGIO GLEN is a 85 y.o. male with coronary artery disease status post PTCA and stenting done many years ago.  Also hypertension dyslipidemia diabetes comes today to my office for follow-up.  Overall seems to be doing well he still exercise on the regular basis, however lately he said he is getting more short of breath while walking and he is thinking maybe he simply getting old, maybe using a bit less exercises.  We will schedule him to have echocardiogram to assess left ventricle ejection fraction.  Past Medical History:  Diagnosis Date  . Coronary artery disease    cardiologist-  dr Wynonia Lawman--  s/p  cardiac cath's w/ angioplasty and stenting x2  . DDD (degenerative disc disease), lumbar   . History of kidney stones   . Hyperlipidemia   . Hypertension   . Lumbar radiculopathy, right    right leg weakness  . Nephrolithiasis    bilateral non-obstructive per ct 01-19-2016  . Peripheral neuropathy    feet  . Right leg weakness    due to lumbar radiculopathy  . Right ureteral stone   . S/P coronary artery stent placement    2002 x1  and 2003 x1  . Type 2 diabetes mellitus ([place])   . Wears hearing aid    BILATERAL    Past Surgical History:  Procedure Laterality Date  . CARDIAC CATHETERIZATION  01/ 2002   dr Wynonia Lawman   mLAD 30%,  CFX OM3 40%,  RCA 30% previous stent site  . CARDIOVASCULAR STRESS TEST  04-15-2009  dr Wynonia Lawman   normal nuclear study w/ no ischemia/  normal LV function and wall motion , ef 77%  . CATARACT EXTRACTION W/ INTRAOCULAR LENS  IMPLANT, BILATERAL  2013  approx.  . COLONOSCOPY WITH PROPOFOL  2014  . CORONARY ANGIOPLASTY  1990  dr Wynonia Lawman   PTCA to OM2   . CORONARY ANGIOPLASTY  10/ 1996  dr Wynonia Lawman   PTCA to RCA  . CORONARY ANGIOPLASTY WITH STENT PLACEMENT  04/ 1996  dr Wynonia Lawman   stenting to RCA  . CYSTOSCOPY WITH URETEROSCOPY AND STENT PLACEMENT Right 03/28/2016   Procedure: CYSTOSCOPY WITH RIGHT  URETEROSCOPY AND STENT PLACEMENT;  Surgeon: Festus Aloe, MD;  Location: [facility];  Service: Urology;  Laterality: Right;  . EXTRACORPOREAL SHOCK WAVE LITHOTRIPSY  yrs ago  . HOLMIUM LASER APPLICATION Right [id number]   Procedure: HOLMIUM LASER APPLICATION;  Surgeon: Festus Aloe, MD;  [facility]: [facility];  Service: Urology;  Laterality: Right;  . LAPAROSCOPIC CHOLECYSTECTOMY  1990's  . TONSILLECTOMY AND ADENOIDECTOMY  child  . URETEROLITHOTOMY  1990's    Current Medications: Current Meds  Medication Sig  . alendronate (FOSAMAX) 70 MG tablet Take 70 mg by mouth every Monday. Take with a full glass of water on an empty stomach.  Marland Kitchen amLODipine (NORVASC) 5 MG tablet Take 5 mg by mouth every evening.   Marland Kitchen aspirin EC 81 MG tablet Take 81 mg by mouth daily.  Marland Kitchen atorvastatin (LIPITOR) 20 MG tablet Take 20 mg by mouth every evening.   . Calcium Citrate 250 MG TABS Take  250 mg by mouth daily.  . cetirizine (ZYRTEC) 10 MG tablet Take 10 mg by mouth daily as needed.   . Cholecalciferol (VITAMIN D PO) Take 5 drops by mouth every evening.   . fenofibrate (TRICOR) 48 MG tablet Take 48 mg by mouth every evening.   . gabapentin (NEURONTIN) 100 MG capsule Take 100-200 mg by mouth 2 (two) times daily. Take 100mg  at noon and then 200mg  at night  . glipiZIDE-metformin (METAGLIP) 2.5-500 MG per tablet Take 2 tablets by mouth 2 (two) times daily before a meal.   . HUMALOG MIX 75/25 KWIKPEN (75-25) 100 UNIT/ML Kwikpen Inject into the skin 2 (two) times daily. 35 units in the morning and then 22 units in the evening.  . hydrochlorothiazide (HYDRODIURIL) 25 MG tablet Take 25 mg by mouth every morning.   Marland Kitchen ibuprofen (ADVIL,MOTRIN) 200  MG tablet Take 200 mg by mouth every 6 (six) hours as needed for headache or moderate pain.   . Magnesium Citrate 125 MG CAPS Take 150 mg by mouth daily.  . metoprolol tartrate (LOPRESSOR) 25 MG tablet Take 25 mg by mouth every morning.   . ramipril (ALTACE) 10 MG capsule Take 20 mg by mouth daily.   . solifenacin (VESICARE) 5 MG tablet Take 5 mg by mouth every morning.   . zolpidem (AMBIEN) 10 MG tablet Take 10 mg by mouth at bedtime.     Allergies:   Sulfa antibiotics   Social History   Socioeconomic History  . Marital status: Married    Spouse name: Not on file  . Number of children: Not on file  . Years of education: Not on file  . Highest education level: Not on file  Occupational History  . Not on file  Social Needs  . Financial resource strain: Not on file  . Food insecurity    Worry: Not on file    Inability: Not on file  . Transportation needs    Medical: Not on file    Non-medical: Not on file  Tobacco Use  . Smoking status: Former Smoker    Years: 40.00    Types: Cigarettes    Quit date: 08/26/1988    Years since quitting: 30.5  . Smokeless tobacco: Never Used  Substance and Sexual Activity  . Alcohol use: Yes    Comment: rare  . Drug use: No  . Sexual activity: Not on file  Lifestyle  . Physical activity    Days per week: Not on file    Minutes per session: Not on file  . Stress: Not on file  Relationships  . Social Herbalist on phone: Not on file    Gets together: Not on file    Attends religious service: Not on file    Active member of club or organization: Not on file    Attends meetings of clubs or organizations: Not on file    Relationship status: Not on file  Other Topics Concern  . Not on file  Social History Narrative  . Not on file     Family History: The patient's family history is not on file. ROS:   Please see the history of present illness.    All 14 point review of systems negative except as described per history of  present illness  EKGs/Labs/Other Studies Reviewed:      Recent Labs: No results found for requested labs within last 8760 hours.  Recent Lipid Panel No results found for: CHOL, TRIG, HDL, CHOLHDL,  VLDL, LDLCALC, LDLDIRECT  Physical Exam:    VS:  BP (!) 144/64   Pulse 72   Ht 5\' 8"  (1.727 m)   Wt 182 lb (82.6 kg)   SpO2 97%   BMI 27.67 kg/m     Wt Readings from Last 3 Encounters:  02/18/19 182 lb (82.6 kg)  01/25/18 186 lb 1.9 oz (84.4 kg)  03/28/16 178 lb (80.7 kg)     GEN:  Well nourished, well developed in no acute distress HEENT: Normal NECK: No JVD; No carotid bruits LYMPHATICS: No lymphadenopathy CARDIAC: RRR, no murmurs, no rubs, no gallops RESPIRATORY:  Clear to auscultation without rales, wheezing or rhonchi  ABDOMEN: Soft, non-tender, non-distended MUSCULOSKELETAL:  No edema; No deformity  SKIN: Warm and dry LOWER EXTREMITIES: no swelling NEUROLOGIC:  Alert and oriented x 3 PSYCHIATRIC:  Normal affect   ASSESSMENT:    1. Coronary artery disease involving native coronary artery of native heart without angina pectoris   2. Dyslipidemia   3. Diabetic peripheral neuropathy associated with type 2 diabetes mellitus ([place])    PLAN:    In order of problems listed above:  1. Coronary disease is stable on appropriate medication which I will continue. 2. Dyslipidemia we will retrieve his last fasting lipid profile.  (D1 from June was excellent with LDL of 29 and HDL of 48.  We will continue present management. 3. Diabetes followed by internal medicine team. 4. Dyspnea on exertion we will schedule him to have echocardiogram to assess left ventricle ejection fraction. 5. He brought some rhythm strips for me from [place].  Those were interpreted as possible atrial fibrillation.  However careful analysis of those rhythm strips showed sinus rhythm with some APCs.   Medication Adjustments/Labs and Tests Ordered: Current medicines are reviewed at length with the  patient today.  Concerns regarding medicines are outlined above.  No orders of the defined types were placed in this encounter.  Medication changes: No orders of the defined types were placed in this encounter.   Signed, Park Liter, MD, [facility] 02/18/2019 10:17 AM    [place]

## 2019-02-20 ENCOUNTER — Other Ambulatory Visit: Payer: Self-pay

## 2019-02-20 ENCOUNTER — Ambulatory Visit (HOSPITAL_BASED_OUTPATIENT_CLINIC_OR_DEPARTMENT_OTHER)
Admission: RE | Admit: 2019-02-20 | Discharge: 2019-02-20 | Disposition: A | Discharge status: Home / Self Care | Payer: Medicare Other | Source: Ambulatory Visit | Attending: Cardiology | Admitting: Cardiology

## 2019-02-20 DIAGNOSIS — I2510 Atherosclerotic heart disease of native coronary artery without angina pectoris: Secondary | ICD-10-CM | POA: Diagnosis present

## 2019-02-20 NOTE — Progress Notes (Signed)
  Echocardiogram 2D Echocardiogram has been performed.  Alejandro Foster 02/20/2019, 1:45 PM

## 2019-02-28 ENCOUNTER — Telehealth: Payer: Self-pay | Admitting: Family

## 2019-02-28 NOTE — Telephone Encounter (Signed)
Patient called asking about echo results. Results reviewed. Normal LVEF, no significant valvular dysfunction. He has grade 1 DD which is likely due to longstanding hx of HTN and age. Reassurance provided that these were good results. He was appreciative of the information.   Loel Dubonnet, NP

## 2019-03-27 DIAGNOSIS — E11319 Type 2 diabetes mellitus with unspecified diabetic retinopathy without macular edema: Secondary | ICD-10-CM | POA: Insufficient documentation

## 2019-03-29 ENCOUNTER — Ambulatory Visit: Payer: Medicare Other | Attending: Internal Medicine

## 2019-03-29 DIAGNOSIS — Z23 Encounter for immunization: Secondary | ICD-10-CM

## 2019-03-29 NOTE — Progress Notes (Signed)
   Covid-19 Vaccination Clinic  Name:  Alejandro Foster    MRN: [id number] DOB: 24-Oct-1933  03/29/2019  [name] was observed post Covid-19 immunization for 15 minutes without incidence. He was provided with Vaccine Information Sheet and instruction to access the V-Safe system.   [name] was instructed to call 911 with any severe reactions post vaccine: Marland Kitchen Difficulty breathing  . Swelling of your face and throat  . A fast heartbeat  . A bad rash all over your body  . Dizziness and weakness    Immunizations Administered    Name Date Dose VIS Date Route   Pfizer COVID-19 Vaccine 03/29/2019  2:06 PM 0.3 mL 02/28/2019 Intramuscular   Manufacturer: [place]   Lot: [id number]   [employer]: [id number]

## 2019-04-02 ENCOUNTER — Ambulatory Visit: Payer: Medicare Other | Attending: Internal Medicine

## 2019-04-02 DIAGNOSIS — Z23 Encounter for immunization: Secondary | ICD-10-CM | POA: Insufficient documentation

## 2019-04-16 ENCOUNTER — Ambulatory Visit: Payer: Medicare Other

## 2019-04-19 ENCOUNTER — Ambulatory Visit: Payer: Medicare Other | Attending: Internal Medicine

## 2019-04-19 DIAGNOSIS — Z23 Encounter for immunization: Secondary | ICD-10-CM

## 2019-04-19 NOTE — Progress Notes (Signed)
   Covid-19 Vaccination Clinic  Name:  ADREIAN TACHE    MRN: [id number] DOB: [birth year]  04/19/2019  Mr. Bonnici was observed post Covid-19 immunization for 15 minutes without incidence. He was provided with Vaccine Information Sheet and instruction to access the V-Safe system.   Mr. Drudge was instructed to call 911 with any severe reactions post vaccine: Marland Kitchen Difficulty breathing  . Swelling of your face and throat  . A fast heartbeat  . A bad rash all over your body  . Dizziness and weakness    Immunizations Administered    Name Date Dose VIS Date Route   Pfizer COVID-19 Vaccine 04/19/2019  8:14 AM 0.3 mL 02/28/2019 Intramuscular   Manufacturer: [place]   Lot: [id number]   [place]: [id number]

## 2019-08-21 ENCOUNTER — Other Ambulatory Visit: Payer: Self-pay

## 2019-08-21 ENCOUNTER — Ambulatory Visit: Payer: Medicare Other | Admitting: Cardiology

## 2019-08-22 ENCOUNTER — Ambulatory Visit: Payer: Medicare Other | Admitting: Cardiology

## 2019-08-22 ENCOUNTER — Encounter: Payer: Self-pay | Admitting: Cardiology

## 2019-08-22 ENCOUNTER — Other Ambulatory Visit: Payer: Self-pay

## 2019-08-22 VITALS — BP 138/62 | HR 66 | Ht 68.0 in | Wt 179.8 lb

## 2019-08-22 DIAGNOSIS — E785 Hyperlipidemia, unspecified: Secondary | ICD-10-CM

## 2019-08-22 DIAGNOSIS — E1142 Type 2 diabetes mellitus with diabetic polyneuropathy: Secondary | ICD-10-CM

## 2019-08-22 DIAGNOSIS — R0600 Dyspnea, unspecified: Secondary | ICD-10-CM | POA: Diagnosis not present

## 2019-08-22 DIAGNOSIS — R0609 Other forms of dyspnea: Secondary | ICD-10-CM

## 2019-08-22 DIAGNOSIS — I2510 Atherosclerotic heart disease of native coronary artery without angina pectoris: Secondary | ICD-10-CM | POA: Diagnosis not present

## 2019-08-22 DIAGNOSIS — R0789 Other chest pain: Secondary | ICD-10-CM | POA: Insufficient documentation

## 2019-08-22 HISTORY — DX: Dyspnea, unspecified: R06.00

## 2019-08-22 HISTORY — DX: Other forms of dyspnea: R06.09

## 2019-08-22 NOTE — Addendum Note (Signed)
Addended by: Ashok Norris on: 08/22/2019 09:56 AM   Modules accepted: Orders

## 2019-08-22 NOTE — Patient Instructions (Signed)
Medication Instructions:  Your physician recommends that you continue on your current medications as directed. Please refer to the Current Medication list given to you today.  *If you need a refill on your cardiac medications before your next appointment, please call your pharmacy*   Lab Work: None.  If you have labs (blood work) drawn today and your tests are completely normal, you will receive your results only by: Marland Kitchen MyChart Message (if you have MyChart) OR . A paper copy in the mail If you have any lab test that is abnormal or we need to change your treatment, we will call you to review the results.   Testing/Procedures:   [facility] [address] [place], [place] [address] Phone:  [phone]    Please arrive 15 minutes prior to your appointment time for registration and insurance purposes.  The test will take approximately 3 to 4 hours to complete; you may bring reading material.  If someone comes with you to your appointment, they will need to remain in the main lobby due to limited space in the testing area. **If you are pregnant or breastfeeding, please notify the nuclear lab prior to your appointment**  How to prepare for your Myocardial Perfusion Test: . Do not eat or drink 3 hours prior to your test, except you may have water. . Do not consume products containing caffeine (regular or decaffeinated) 12 hours prior to your test. (ex: coffee, chocolate, sodas, tea). . Do bring a list of your current medications with you.  If not listed below, you may take your medications as normal. .  HOLD diabetic medication/insulin the morning of the test: Glipizide, humalog  . Do wear comfortable clothes (no dresses or overalls) and walking shoes, tennis shoes preferred (No heels or open toe shoes are allowed). . Do NOT wear cologne, perfume, aftershave, or lotions (deodorant is allowed). . If these instructions are not followed, your test will have to be  rescheduled.  Please report to [address], [address] for your test.  If you have questions or concerns about your appointment, you can call the Nuclear Lab at [phone].  If you cannot keep your appointment, please provide 24 hours notification to the Nuclear Lab, to avoid a possible $50 charge to your account.    Follow-Up: At [facility], you and your health needs are our priority.  As part of our continuing mission to provide you with exceptional heart care, we have created designated Provider Care Teams.  These Care Teams include your primary Cardiologist (physician) and Advanced Practice Providers (APPs -  Physician Assistants and Nurse Practitioners) who all work together to provide you with the care you need, when you need it.  We recommend signing up for the patient portal called "MyChart".  Sign up information is provided on this After Visit Summary.  MyChart is used to connect with patients for Virtual Visits (Telemedicine).  Patients are able to view lab/test results, encounter notes, upcoming appointments, etc.  Non-urgent messages can be sent to your provider as well.   To learn more about what you can do with MyChart, go to [email or web address].    Your next appointment:   6 month(s)  The format for your next appointment:   In Person  Provider:   Jenne Campus, MD   Other Instructions   Cardiac Nuclear Scan A cardiac nuclear scan is a test that measures blood flow to the heart when a person is resting and when he or  she is exercising. The test looks for problems such as:  Not enough blood reaching a portion of the heart.  The heart muscle not working normally. You may need this test if:  You have heart disease.  You have had abnormal lab results.  You have had heart surgery or a balloon procedure to open up blocked arteries (angioplasty).  You have chest pain.  You have shortness of breath. In this test, a radioactive dye (tracer) is  injected into your bloodstream. After the tracer has traveled to your heart, an imaging device is used to measure how much of the tracer is absorbed by or distributed to various areas of your heart. This procedure is usually done at a hospital and takes 2-4 hours. Tell a health care provider about:  Any allergies you have.  All medicines you are taking, including vitamins, herbs, eye drops, creams, and over-the-counter medicines.  Any problems you or family members have had with anesthetic medicines.  Any blood disorders you have.  Any surgeries you have had.  Any medical conditions you have.  Whether you are pregnant or may be pregnant. What are the risks? Generally, this is a safe procedure. However, problems may occur, including:  Serious chest pain and heart attack. This is only a risk if the stress portion of the test is done.  Rapid heartbeat.  Sensation of warmth in your chest. This usually passes quickly.  Allergic reaction to the tracer. What happens before the procedure?  Ask your health care provider about changing or stopping your regular medicines. This is especially important if you are taking diabetes medicines or blood thinners.  Follow instructions from your health care provider about eating or drinking restrictions.  Remove your jewelry on the day of the procedure. What happens during the procedure?  An IV will be inserted into one of your veins.  Your health care provider will inject a small amount of radioactive tracer through the IV.  You will wait for 20-40 minutes while the tracer travels through your bloodstream.  Your heart activity will be monitored with an electrocardiogram (ECG).  You will lie down on an exam table.  Images of your heart will be taken for about 15-20 minutes.  You may also have a stress test. For this test, one of the following may be done: ? You will exercise on a treadmill or stationary bike. While you exercise, your  heart's activity will be monitored with an ECG, and your blood pressure will be checked. ? You will be given medicines that will increase blood flow to parts of your heart. This is done if you are unable to exercise.  When blood flow to your heart has peaked, a tracer will again be injected through the IV.  After 20-40 minutes, you will get back on the exam table and have more images taken of your heart.  Depending on the type of tracer used, scans may need to be repeated 3-4 hours later.  Your IV line will be removed when the procedure is over. The procedure may vary among health care providers and hospitals. What happens after the procedure?  Unless your health care provider tells you otherwise, you may return to your normal schedule, including diet, activities, and medicines.  Unless your health care provider tells you otherwise, you may increase your fluid intake. This will help to flush the contrast dye from your body. Drink enough fluid to keep your urine pale yellow.  Ask your health care provider, or the  department that is doing the test: ? When will my results be ready? ? How will I get my results? Summary  A cardiac nuclear scan measures the blood flow to the heart when a person is resting and when he or she is exercising.  Tell your health care provider if you are pregnant.  Before the procedure, ask your health care provider about changing or stopping your regular medicines. This is especially important if you are taking diabetes medicines or blood thinners.  After the procedure, unless your health care provider tells you otherwise, increase your fluid intake. This will help flush the contrast dye from your body.  After the procedure, unless your health care provider tells you otherwise, you may return to your normal schedule, including diet, activities, and medicines. This information is not intended to replace advice given to you by your health care provider. Make sure  you discuss any questions you have with your health care provider. Document Revised: 08/20/2017 Document Reviewed: 08/20/2017 Elsevier Patient Education  [place].

## 2019-08-22 NOTE — Progress Notes (Signed)
Cardiology Office Note:    Date:  08/22/2019   ID:  Alejandro Foster, DOB 1934/03/06, MRN [id number]  PCP:  Shon Baton, MD  Cardiologist:  Jenne Campus, MD    Referring MD: Shon Baton, MD   Chief Complaint  Patient presents with  . Follow-up    6 MO FU   I have chest pain  History of Present Illness:    RAVIN DENARDO is a 86 y.o. male with past medical history significant for coronary artery disease, status post PTCA and stenting done many years ago more than 53, also has history of hypertension dyslipidemia diabetes.  Comes today to my office follow-up.  Overall he seems to be doing well but described to type of chest pains one is the one that he gets after he eats and then he lay down at evening time he takes Tums with good relief secondhand of chest pain that happened when he was hypoglycemic he felt a sharp stabbing-like sensation in the chest.  He tried to walk on a regular basis he got dog that is only 66-year-old he walks with a dog few times a day have no difficulty doing this.  Past Medical History:  Diagnosis Date  . Coronary artery disease    cardiologist-  dr Wynonia Lawman--  s/p  cardiac cath's w/ angioplasty and stenting x2  . DDD (degenerative disc disease), lumbar   . History of kidney stones   . Hyperlipidemia   . Hypertension   . Lumbar radiculopathy, right    right leg weakness  . Nephrolithiasis    bilateral non-obstructive per ct 01-19-2016  . Peripheral neuropathy    feet  . Right leg weakness    due to lumbar radiculopathy  . Right ureteral stone   . S/P coronary artery stent placement    2002 x1  and 2003 x1  . Type 2 diabetes mellitus ([place])   . Wears hearing aid    BILATERAL    Past Surgical History:  Procedure Laterality Date  . CARDIAC CATHETERIZATION  01/ 2002   dr Wynonia Lawman   mLAD 30%,  CFX OM3 40%,  RCA 30% previous stent site  . CARDIOVASCULAR STRESS TEST  04-15-2009  dr Wynonia Lawman   normal nuclear study w/ no ischemia/  normal LV function and wall  motion , ef 77%  . CATARACT EXTRACTION W/ INTRAOCULAR LENS  IMPLANT, BILATERAL  2013  approx.  . COLONOSCOPY WITH PROPOFOL  2014  . CORONARY ANGIOPLASTY  1990  dr Wynonia Lawman   PTCA to OM2  . CORONARY ANGIOPLASTY  10/ 1996  dr Wynonia Lawman   PTCA to RCA  . CORONARY ANGIOPLASTY WITH STENT PLACEMENT  04/ 1996  dr Wynonia Lawman   stenting to RCA  . CYSTOSCOPY WITH URETEROSCOPY AND STENT PLACEMENT Right 03/28/2016   Procedure: CYSTOSCOPY WITH RIGHT  URETEROSCOPY AND STENT PLACEMENT;  Surgeon: Festus Aloe, MD;  Location: [facility];  Service: Urology;  Laterality: Right;  . EXTRACORPOREAL SHOCK WAVE LITHOTRIPSY  yrs ago  . HOLMIUM LASER APPLICATION Right 04/24/3662   Procedure: HOLMIUM LASER APPLICATION;  Surgeon: Festus Aloe, MD;  Location: [facility];  Service: Urology;  Laterality: Right;  . LAPAROSCOPIC CHOLECYSTECTOMY  1990's  . TONSILLECTOMY AND ADENOIDECTOMY  child  . URETEROLITHOTOMY  1990's    Current Medications: Current Meds  Medication Sig  . alendronate (FOSAMAX) 70 MG tablet Take 70 mg by mouth every Monday. Take with a full glass of water on an empty stomach.  Marland Kitchen amLODipine (  NORVASC) 5 MG tablet Take 5 mg by mouth every evening.   Marland Kitchen aspirin EC 81 MG tablet Take 81 mg by mouth daily.  Marland Kitchen atorvastatin (LIPITOR) 20 MG tablet Take 20 mg by mouth every evening.   . B-D UF III MINI PEN NEEDLES 31G X 5 MM MISC USE AS DIRECTED TWICE DAILY WITH INSULIN  . Calcium Citrate 250 MG TABS Take 250 mg by mouth daily.  . cetirizine (ZYRTEC) 10 MG tablet Take 10 mg by mouth daily as needed.   . Cholecalciferol (VITAMIN D PO) Take 5 drops by mouth every evening.   . fenofibrate (TRICOR) 48 MG tablet Take 48 mg by mouth every evening.   . gabapentin (NEURONTIN) 100 MG capsule Take 100-200 mg by mouth 2 (two) times daily. Take 100mg  at noon and then 200mg  at night  . glipiZIDE-metformin (METAGLIP) 2.5-500 MG per tablet Take 2 tablets by mouth 2 (two) times daily before a meal.    . HUMALOG MIX 75/25 KWIKPEN (75-25) 100 UNIT/ML Kwikpen Inject into the skin 2 (two) times daily. 35 units in the morning and then 22 units in the evening.  . hydrochlorothiazide (HYDRODIURIL) 25 MG tablet Take 25 mg by mouth every morning.   Marland Kitchen ibuprofen (ADVIL,MOTRIN) 200 MG tablet Take 200 mg by mouth every 6 (six) hours as needed for headache or moderate pain.   . Magnesium Citrate 125 MG CAPS Take 150 mg by mouth daily.  . metoprolol tartrate (LOPRESSOR) 25 MG tablet Take 25 mg by mouth every morning.   . ramipril (ALTACE) 10 MG capsule Take 10 mg by mouth 2 (two) times daily.   . solifenacin (VESICARE) 5 MG tablet Take 5 mg by mouth every morning.   . zolpidem (AMBIEN) 10 MG tablet Take 10 mg by mouth at bedtime.     Allergies:   Sulfa antibiotics   Social History   Socioeconomic History  . Marital status: Married    Spouse name: Not on file  . Number of children: Not on file  . Years of education: Not on file  . Highest education level: Not on file  Occupational History  . Not on file  Tobacco Use  . Smoking status: Former Smoker    Years: 40.00    Types: Cigarettes    Quit date: 08/26/1988    Years since quitting: 31.0  . Smokeless tobacco: Never Used  Substance and Sexual Activity  . Alcohol use: Yes    Comment: rare  . Drug use: No  . Sexual activity: Not on file  Other Topics Concern  . Not on file  Social History Narrative  . Not on file   Social Determinants of Health   Financial Resource Strain:   . Difficulty of Paying Living Expenses:   Food Insecurity:   . Worried About Charity fundraiser in the Last Year:   . Arboriculturist in the Last Year:   Transportation Needs:   . Film/video editor (Medical):   Marland Kitchen Lack of Transportation (Non-Medical):   Physical Activity:   . Days of Exercise per Week:   . Minutes of Exercise per Session:   Stress:   . Feeling of Stress :   Social Connections:   . Frequency of Communication with Friends and Family:     . Frequency of Social Gatherings with Friends and Family:   . Attends Religious Services:   . Active Member of Clubs or Organizations:   . Attends Archivist Meetings:   .  Marital Status:      Family History: The patient's family history is not on file. ROS:   Please see the history of present illness.    All 14 point review of systems negative except as described per history of present illness  EKGs/Labs/Other Studies Reviewed:      Recent Labs: No results found for requested labs within last 8760 hours.  Recent Lipid Panel No results found for: CHOL, TRIG, HDL, CHOLHDL, VLDL, LDLCALC, LDLDIRECT  Physical Exam:    VS:  BP 138/62   Pulse 66   Ht 5\' 8"  (1.727 m)   Wt 179 lb 12.8 oz (81.6 kg)   SpO2 99%   BMI 27.34 kg/m     Wt Readings from Last 3 Encounters:  08/22/19 179 lb 12.8 oz (81.6 kg)  02/18/19 182 lb (82.6 kg)  01/25/18 186 lb 1.9 oz (84.4 kg)     GEN:  Well nourished, well developed in no acute distress HEENT: Normal NECK: No JVD; No carotid bruits LYMPHATICS: No lymphadenopathy CARDIAC: RRR, no murmurs, no rubs, no gallops RESPIRATORY:  Clear to auscultation without rales, wheezing or rhonchi  ABDOMEN: Soft, non-tender, non-distended MUSCULOSKELETAL:  No edema; No deformity  SKIN: Warm and dry LOWER EXTREMITIES: no swelling NEUROLOGIC:  Alert and oriented x 3 PSYCHIATRIC:  Normal affect   ASSESSMENT:    1. Coronary artery disease involving native coronary artery of native heart without angina pectoris   2. Diabetic peripheral neuropathy associated with type 2 diabetes mellitus ([place])   3. Dyslipidemia   4. Dyspnea on exertion   5. Atypical chest pain    PLAN:    In order of problems listed above:  1. Coronary disease status post PTCA stenting many years ago he does have some atypical symptoms.  I will ask him to have stress test done.  I do this mostly because of the fact that he got longstanding diabetes and he may have angina  equivalent.  Last time I have seen him he was complained of any shortness of breath he said he does not have that problem anymore.  We did review his echocardiogram that was done last time which showed preserved left ventricle ejection fraction.  He is on antiplatelet therapy and statin which is appropriate management which I will continue. 2. Diabetic neuropathy followed by internal medicine team. 3. Dyslipidemia: He is on moderate intensity statin.  I did review his fasting lipid profile from [facility] LDL showing 29 HDL 48.  He is scheduled to see his primary care physician within the next few weeks and he will have fasting lipid profile done again. 4. Dyspnea on exertion improved. 5. Atypical chest pain: We will schedule him to have a stress test. 6. We did talk about healthy lifestyle need to exercise with the diet   Medication Adjustments/Labs and Tests Ordered: Current medicines are reviewed at length with the patient today.  Concerns regarding medicines are outlined above.  No orders of the defined types were placed in this encounter.  Medication changes: No orders of the defined types were placed in this encounter.   Signed, Park Liter, MD, [facility] 08/22/2019 9:35 AM    [place]

## 2019-09-03 ENCOUNTER — Telehealth (HOSPITAL_COMMUNITY): Payer: Self-pay | Admitting: *Deleted

## 2019-09-03 NOTE — Telephone Encounter (Signed)
Patient given detailed instructions per Myocardial Perfusion Study Information Sheet for the test on 09/10/2019 at 0745. Patient notified to arrive 15 minutes early and that it is imperative to arrive on time for appointment to keep from having the test rescheduled.  If you need to cancel or reschedule your appointment, please call the office within 24 hours of your appointment. . Patient verbalized understanding.Zedekiah Hinderman, Ranae Palms Patient did not want letter with instructions on mychart sent

## 2019-09-10 ENCOUNTER — Other Ambulatory Visit: Payer: Self-pay

## 2019-09-10 ENCOUNTER — Ambulatory Visit (HOSPITAL_COMMUNITY): Payer: Medicare Other | Attending: Cardiology

## 2019-09-10 VITALS — Ht 68.0 in | Wt 179.0 lb

## 2019-09-10 DIAGNOSIS — R0600 Dyspnea, unspecified: Secondary | ICD-10-CM | POA: Insufficient documentation

## 2019-09-10 DIAGNOSIS — R0789 Other chest pain: Secondary | ICD-10-CM | POA: Insufficient documentation

## 2019-09-10 DIAGNOSIS — R0609 Other forms of dyspnea: Secondary | ICD-10-CM

## 2019-09-10 LAB — MYOCARDIAL PERFUSION IMAGING
LV dias vol: 77 mL (ref 62–150)
LV sys vol: 25 mL
Peak HR: 68 {beats}/min
Rest HR: 54 {beats}/min
SDS: 0
SRS: 0
SSS: 0
TID: 1

## 2019-09-10 MED ORDER — TECHNETIUM TC 99M TETROFOSMIN IV KIT
9.6000 | PACK | Freq: Once | INTRAVENOUS | Status: AC | PRN
  Administered 2019-09-10: 9.6 via INTRAVENOUS
  Filled 2019-09-10: qty 10

## 2019-09-10 MED ORDER — REGADENOSON 0.4 MG/5ML IV SOLN
0.4000 mg | Freq: Once | INTRAVENOUS | Status: AC
  Administered 2019-09-10: 0.4 mg via INTRAVENOUS

## 2019-09-10 MED ORDER — TECHNETIUM TC 99M TETROFOSMIN IV KIT
30.8000 | PACK | Freq: Once | INTRAVENOUS | Status: AC | PRN
  Administered 2019-09-10: 30.8 via INTRAVENOUS
  Filled 2019-09-10: qty 31

## 2019-09-19 DIAGNOSIS — R413 Other amnesia: Secondary | ICD-10-CM | POA: Insufficient documentation

## 2019-11-10 ENCOUNTER — Other Ambulatory Visit: Payer: Self-pay

## 2019-11-10 ENCOUNTER — Ambulatory Visit (INDEPENDENT_AMBULATORY_CARE_PROVIDER_SITE_OTHER): Payer: Medicare Other | Admitting: Ophthalmology

## 2019-11-10 ENCOUNTER — Encounter (INDEPENDENT_AMBULATORY_CARE_PROVIDER_SITE_OTHER): Payer: Self-pay | Admitting: Ophthalmology

## 2019-11-10 DIAGNOSIS — E113292 Type 2 diabetes mellitus with mild nonproliferative diabetic retinopathy without macular edema, left eye: Secondary | ICD-10-CM

## 2019-11-10 DIAGNOSIS — Z961 Presence of intraocular lens: Secondary | ICD-10-CM

## 2019-11-10 DIAGNOSIS — Z794 Long term (current) use of insulin: Secondary | ICD-10-CM

## 2019-11-10 DIAGNOSIS — E113291 Type 2 diabetes mellitus with mild nonproliferative diabetic retinopathy without macular edema, right eye: Secondary | ICD-10-CM

## 2019-11-10 DIAGNOSIS — H43813 Vitreous degeneration, bilateral: Secondary | ICD-10-CM

## 2019-11-10 HISTORY — DX: Presence of intraocular lens: Z96.1

## 2019-11-10 HISTORY — DX: Type 2 diabetes mellitus with mild nonproliferative diabetic retinopathy without macular edema, right eye: E11.3291

## 2019-11-10 HISTORY — DX: Type 2 diabetes mellitus with mild nonproliferative diabetic retinopathy without macular edema, left eye: E11.3292

## 2019-11-10 HISTORY — DX: Vitreous degeneration, bilateral: H43.813

## 2019-11-10 NOTE — Assessment & Plan Note (Signed)

## 2019-11-10 NOTE — Progress Notes (Signed)
11/10/2019     CHIEF COMPLAINT Patient presents for Retina Follow Up   HISTORY OF PRESENT ILLNESS: Alejandro Foster is a 86 y.o. male who presents to the clinic today for:   HPI    Retina Follow Up    Patient presents with  Diabetic Retinopathy.  In both eyes.  Severity is moderate.  Duration of 1 year.  Since onset it is stable.  I, the attending physician,  performed the HPI with the patient and updated documentation appropriately.          Comments    1 Year NPDR f\u OU. Oct  Pt states OS NVA has decreased. Pt denies floaters and FOL. BGL: did not check A1C: 7.1       Last edited by Tilda Franco on 11/10/2019  8:31 AM. (History)      Referring physician: Shon Baton, MD [address] [place],  [place] [address]  HISTORICAL INFORMATION:   Selected notes from the [place]: No current outpatient medications on file. (Ophthalmic Drugs)   No current facility-administered medications for this visit. (Ophthalmic Drugs)   Current Outpatient Medications (Other)  Medication Sig  . alendronate (FOSAMAX) 70 MG tablet Take 70 mg by mouth every Monday. Take with a full glass of water on an empty stomach.  Marland Kitchen amLODipine (NORVASC) 5 MG tablet Take 5 mg by mouth every evening.   Marland Kitchen aspirin EC 81 MG tablet Take 81 mg by mouth daily.  Marland Kitchen atorvastatin (LIPITOR) 20 MG tablet Take 20 mg by mouth every evening.   . B-D UF III MINI PEN NEEDLES 31G X 5 MM MISC USE AS DIRECTED TWICE DAILY WITH INSULIN  . Calcium Citrate 250 MG TABS Take 250 mg by mouth daily.  . cetirizine (ZYRTEC) 10 MG tablet Take 10 mg by mouth daily as needed.   . Cholecalciferol (VITAMIN D PO) Take 5 drops by mouth every evening.   . DULoxetine (CYMBALTA) 30 MG capsule Take 30 mg by mouth daily.  . fenofibrate (TRICOR) 48 MG tablet Take 48 mg by mouth every evening.   . gabapentin (NEURONTIN) 100 MG capsule Take 100-200 mg by mouth 2 (two) times daily. Take 100mg  at noon and  then 200mg  at night  . glipiZIDE-metformin (METAGLIP) 2.5-500 MG per tablet Take 2 tablets by mouth 2 (two) times daily before a meal.   . HUMALOG MIX 75/25 KWIKPEN (75-25) 100 UNIT/ML Kwikpen Inject into the skin 2 (two) times daily. 35 units in the morning and then 22 units in the evening.  . hydrochlorothiazide (HYDRODIURIL) 25 MG tablet Take 25 mg by mouth every morning.   Marland Kitchen ibuprofen (ADVIL,MOTRIN) 200 MG tablet Take 200 mg by mouth every 6 (six) hours as needed for headache or moderate pain.   . Magnesium Citrate 125 MG CAPS Take 150 mg by mouth daily.  . metoprolol tartrate (LOPRESSOR) 25 MG tablet Take 25 mg by mouth every morning.   . ramipril (ALTACE) 10 MG capsule Take 10 mg by mouth 2 (two) times daily.   . solifenacin (VESICARE) 5 MG tablet Take 5 mg by mouth every morning.   . zolpidem (AMBIEN) 10 MG tablet Take 10 mg by mouth at bedtime.   No current facility-administered medications for this visit. (Other)      REVIEW OF SYSTEMS: ROS    Positive for: Endocrine   Last edited by Tilda Franco on 11/10/2019  8:31 AM. (History)  ALLERGIES Allergies  Allergen Reactions  . Sulfa Antibiotics Other (See Comments)    Unknown reaction- childhood allergy    PAST MEDICAL HISTORY Past Medical History:  Diagnosis Date  . Coronary artery disease    cardiologist-  dr Wynonia Lawman--  s/p  cardiac cath's w/ angioplasty and stenting x2  . DDD (degenerative disc disease), lumbar   . History of kidney stones   . Hyperlipidemia   . Hypertension   . Lumbar radiculopathy, right    right leg weakness  . Nephrolithiasis    bilateral non-obstructive per ct 01-19-2016  . Peripheral neuropathy    feet  . Right leg weakness    due to lumbar radiculopathy  . Right ureteral stone   . S/P coronary artery stent placement    2002 x1  and 2003 x1  . Type 2 diabetes mellitus ([place])   . Wears hearing aid    BILATERAL   Past Surgical History:  Procedure Laterality Date  . CARDIAC  CATHETERIZATION  01/ 2002   dr Wynonia Lawman   mLAD 30%,  CFX OM3 40%,  RCA 30% previous stent site  . CARDIOVASCULAR STRESS TEST  04-15-2009  dr Wynonia Lawman   normal nuclear study w/ no ischemia/  normal LV function and wall motion , ef 77%  . CATARACT EXTRACTION W/ INTRAOCULAR LENS  IMPLANT, BILATERAL  2013  approx.  . COLONOSCOPY WITH PROPOFOL  2014  . CORONARY ANGIOPLASTY  1990  dr Wynonia Lawman   PTCA to OM2  . CORONARY ANGIOPLASTY  10/ 1996  dr Wynonia Lawman   PTCA to RCA  . CORONARY ANGIOPLASTY WITH STENT PLACEMENT  04/ 1996  dr Wynonia Lawman   stenting to RCA  . CYSTOSCOPY WITH URETEROSCOPY AND STENT PLACEMENT Right 03/28/2016   Procedure: CYSTOSCOPY WITH RIGHT  URETEROSCOPY AND STENT PLACEMENT;  Surgeon: Festus Aloe, MD;  Location: [facility];  Service: Urology;  Laterality: Right;  . EXTRACORPOREAL SHOCK WAVE LITHOTRIPSY  yrs ago  . HOLMIUM LASER APPLICATION Right 09/25/2954   Procedure: HOLMIUM LASER APPLICATION;  Surgeon: Festus Aloe, MD;  Location: [facility];  Service: Urology;  Laterality: Right;  . LAPAROSCOPIC CHOLECYSTECTOMY  1990's  . TONSILLECTOMY AND ADENOIDECTOMY  child  . URETEROLITHOTOMY  1990's    FAMILY HISTORY History reviewed. No pertinent family history.  SOCIAL HISTORY Social History   Tobacco Use  . Smoking status: Former Smoker    Years: 40.00    Types: Cigarettes    Quit date: 08/26/1988    Years since quitting: 31.2  . Smokeless tobacco: Never Used  Substance Use Topics  . Alcohol use: Yes    Comment: rare  . Drug use: No         OPHTHALMIC EXAM:  Base Eye Exam    Visual Acuity (Snellen - Linear)      Right Left   Dist  20/25 -1 20/25 -1       Tonometry (Tonopen, 8:37 AM)      Right Left   Pressure 15 18       Pupils      Pupils Dark Light Shape React APD   Right PERRL 3 3 Round Minimal None   Left PERRL 3 3 Round Minimal None       Visual Fields (Counting fingers)      Left Right    Full Full       Neuro/Psych     Oriented x3: Yes   Mood/Affect: Normal       Dilation  Both eyes: 1.0% Mydriacyl, 2.5% Phenylephrine @ 8:37 AM        Slit Lamp and Fundus Exam    External Exam      Right Left   External Normal Normal       Slit Lamp Exam      Right Left   Lids/Lashes Normal Normal   Conjunctiva/Sclera White and quiet White and quiet   Cornea Clear Clear   Anterior Chamber Deep and quiet Deep and quiet   Iris Round and reactive Round and reactive   Lens Centered posterior chamber intraocular lens Centered posterior chamber intraocular lens   Anterior Vitreous Normal Normal       Fundus Exam      Right Left   Posterior Vitreous Normal Normal   Disc Normal Normal   C/D Ratio 0.35 0.4   Macula Normal Normal   Vessels no DR no DR   Periphery Normal Normal          IMAGING AND PROCEDURES  Imaging and Procedures for 11/10/19  OCT, Retina - OU - Both Eyes       Right Eye Quality was good. Scan locations included subfoveal. Central Foveal Thickness: 264.   Left Eye Quality was good. Scan locations included subfoveal. Central Foveal Thickness: 262.   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 Emphasis was placed on tight glucose, blood pressure, and serum lipid control. Avoidance of smoking was emphasized. Maintenance of normal body weight was emphasized. Appropriate follow up dilated exam is 1 year.  Risk findings in the past but no detectable today.  We will continue with annual dilated examinations  Posterior vitreous detachment of both eyes The nature of wet macular degeneration was discussed with the patient.  Forms of therapy reviewed include the use of Anti-VEGF medications injected painlessly into the eye, as  well as other possible treatment modalities, including thermal laser therapy. Fellow eye involvement and risks were discussed with the patient. Upon the finding of wet age related macular degeneration, treatment will be offered. The treatment regimen is on a treat as needed basis with the intent to treat if necessary and extend interval of exams when possible. On average 1 out of 6 patients do not need lifetime therapy. However, the risk of recurrent disease is high for a lifetime.  Initially monthly, then periodic, examinations and evaluations will determine whether the next treatment is required on the day of the examination.      ICD-10-CM   1. Type 2 diabetes mellitus with left eye affected by mild nonproliferative retinopathy without macular edema, with long-term current use of insulin (HCC)  E11.3292 OCT, Retina - OU - Both Eyes   Z79.4   2. Pseudophakia of both eyes  Z96.1   3. Controlled type 2 diabetes mellitus with mild nonproliferative retinopathy of right eye, with long-term current use of insulin, macular edema presence unspecified (HCC)  E11.3291 OCT, Retina - OU - Both Eyes   Z79.4   4. Posterior vitreous detachment of both eyes  H43.813     1.  Looks great OU, still no active maculopathy nor significant diabetic retinopathy OU  2.  3.  Ophthalmic Meds Ordered this visit:  No orders of the defined types were placed  in this encounter.      Return in about 1 year (around 11/09/2020) for DILATE OU, OCT.  Patient Instructions  Notify the office promptly for sure for new visual acuity decline or change    Explained the diagnoses, plan, and follow up with the patient and they expressed understanding.  Patient expressed understanding of the importance of proper follow up care.   Clent Demark Khali Perella M.D. Diseases & Surgery of the Retina and Vitreous [facility]ina [facility] Di[facility] [place] 11/10/19     Abbreviations: M myopia (nearsighted); A astigmatism; H hyperopia (farsighted); P  presbyopia; Mrx spectacle prescription;  CTL contact lenses; OD right eye; OS left eye; OU both eyes  XT exotropia; ET esotropia; PEK punctate epithelial keratitis; PEE punctate epithelial erosions; DES dry eye syndrome; MGD meibomian gland dysfunction; ATs artificial tears; PFAT's preservative free artificial tears; [place] nuclear sclerotic cataract; PSC posterior subcapsular cataract; ERM epi-retinal membrane; PVD posterior vitreous detachment; RD retinal detachment; DM diabetes mellitus; DR diabetic retinopathy; NPDR non-proliferative diabetic retinopathy; PDR proliferative diabetic retinopathy; CSME clinically significant macular edema; DME diabetic macular edema; dbh dot blot hemorrhages; CWS cotton wool spot; POAG primary open angle glaucoma; C/D cup-to-disc ratio; HVF humphrey visual field; GVF goldmann visual field; OCT optical coherence tomography; IOP intraocular pressure; BRVO Branch retinal vein occlusion; CRVO central retinal vein occlusion; CRAO central retinal artery occlusion; BRAO branch retinal artery occlusion; RT retinal tear; SB scleral buckle; PPV pars plana vitrectomy; VH Vitreous hemorrhage; PRP panretinal laser photocoagulation; IVK intravitreal kenalog; VMT vitreomacular traction; MH Macular hole;  NVD neovascularization of the disc; NVE neovascularization elsewhere; AREDS age related eye disease study; ARMD age related macular degeneration; POAG primary open angle glaucoma; EBMD epithelial/anterior basement membrane dystrophy; ACIOL anterior chamber intraocular lens; IOL intraocular lens; PCIOL posterior chamber intraocular lens; Phaco/IOL phacoemulsification with intraocular lens placement; [place] photorefractive keratectomy; LASIK laser assisted in situ keratomileusis; HTN hypertension; DM diabetes mellitus; COPD chronic obstructive pulmonary disease

## 2019-11-10 NOTE — Assessment & Plan Note (Signed)
The nature of mild nonproliferative diabetic retinopathy was discussed with the patient. Emphasis was placed on tight glucose, blood pressure, and serum lipid control. Avoidance of smoking was emphasized. Maintenance of normal body weight was emphasized. Appropriate follow up dilated exam is 1 year.  Risk findings in the past but no detectable today.  We will continue with annual dilated examinations

## 2019-11-10 NOTE — Patient Instructions (Signed)
Notify the office promptly for sure for new visual acuity decline or change

## 2020-03-03 ENCOUNTER — Ambulatory Visit: Payer: Medicare Other | Admitting: Cardiology

## 2020-03-16 DIAGNOSIS — M5416 Radiculopathy, lumbar region: Secondary | ICD-10-CM | POA: Insufficient documentation

## 2020-03-16 DIAGNOSIS — R29898 Other symptoms and signs involving the musculoskeletal system: Secondary | ICD-10-CM | POA: Insufficient documentation

## 2020-03-16 DIAGNOSIS — E119 Type 2 diabetes mellitus without complications: Secondary | ICD-10-CM | POA: Insufficient documentation

## 2020-03-16 DIAGNOSIS — Z974 Presence of external hearing-aid: Secondary | ICD-10-CM | POA: Insufficient documentation

## 2020-03-16 DIAGNOSIS — Z87442 Personal history of urinary calculi: Secondary | ICD-10-CM | POA: Insufficient documentation

## 2020-03-16 DIAGNOSIS — M5136 Other intervertebral disc degeneration, lumbar region: Secondary | ICD-10-CM | POA: Insufficient documentation

## 2020-03-16 DIAGNOSIS — Z955 Presence of coronary angioplasty implant and graft: Secondary | ICD-10-CM | POA: Insufficient documentation

## 2020-03-16 DIAGNOSIS — N201 Calculus of ureter: Secondary | ICD-10-CM | POA: Insufficient documentation

## 2020-03-17 ENCOUNTER — Other Ambulatory Visit: Payer: Self-pay

## 2020-03-17 ENCOUNTER — Ambulatory Visit: Payer: Medicare Other | Admitting: Cardiology

## 2020-03-17 ENCOUNTER — Encounter: Payer: Self-pay | Admitting: Cardiology

## 2020-03-17 VITALS — BP 110/68 | HR 55 | Ht 68.0 in | Wt 180.0 lb

## 2020-03-17 DIAGNOSIS — I2510 Atherosclerotic heart disease of native coronary artery without angina pectoris: Secondary | ICD-10-CM | POA: Diagnosis not present

## 2020-03-17 DIAGNOSIS — Z794 Long term (current) use of insulin: Secondary | ICD-10-CM

## 2020-03-17 DIAGNOSIS — E113292 Type 2 diabetes mellitus with mild nonproliferative diabetic retinopathy without macular edema, left eye: Secondary | ICD-10-CM | POA: Diagnosis not present

## 2020-03-17 DIAGNOSIS — I739 Peripheral vascular disease, unspecified: Secondary | ICD-10-CM

## 2020-03-17 DIAGNOSIS — E785 Hyperlipidemia, unspecified: Secondary | ICD-10-CM | POA: Diagnosis not present

## 2020-03-17 DIAGNOSIS — I10 Essential (primary) hypertension: Secondary | ICD-10-CM | POA: Diagnosis not present

## 2020-03-17 NOTE — Addendum Note (Signed)
Addended by: Delorse Limber I on: 03/17/2020 09:33 AM   Modules accepted: Orders

## 2020-03-17 NOTE — Progress Notes (Signed)
g

## 2020-03-17 NOTE — Patient Instructions (Signed)
Medication Instructions:  Your physician recommends that you continue on your current medications as directed. Please refer to the Current Medication list given to you today.  *If you need a refill on your cardiac medications before your next appointment, please call your pharmacy*   Lab Work: None If you have labs (blood work) drawn today and your tests are completely normal, you will receive your results only by: Marland Kitchen MyChart Message (if you have MyChart) OR . A paper copy in the mail If you have any lab test that is abnormal or we need to change your treatment, we will call you to review the results.   Testing/Procedures: Your physician has requested that you have an ankle brachial index (ABI). During this test an ultrasound and blood pressure cuff are used to evaluate the arteries that supply the arms and legs with blood. Allow thirty minutes for this exam. There are no restrictions or special instructions.  Your physician has requested that you have a lower or upper extremity arterial duplex. This test is an ultrasound of the arteries in the legs or arms. It looks at arterial blood flow in the legs and arms. Allow one hour for Lower and Upper Arterial scans. There are no restrictions or special instructions    Follow-Up: At [facility], you and your health needs are our priority.  As part of our continuing mission to provide you with exceptional heart care, we have created designated Provider Care Teams.  These Care Teams include your primary Cardiologist (physician) and Advanced Practice Providers (APPs -  Physician Assistants and Nurse Practitioners) who all work together to provide you with the care you need, when you need it.  We recommend signing up for the patient portal called "MyChart".  Sign up information is provided on this After Visit Summary.  MyChart is used to connect with patients for Virtual Visits (Telemedicine).  Patients are able to view lab/test results, encounter  notes, upcoming appointments, etc.  Non-urgent messages can be sent to your provider as well.   To learn more about what you can do with MyChart, go to [email or web address].    Your next appointment:   6 month(s)  The format for your next appointment:   In Person  Provider:   Gypsy Balsam, MD   Other Instructions

## 2020-03-17 NOTE — Progress Notes (Signed)
Cardiology Office Note:    Date:  03/17/2020   ID:  Alejandro Foster, DOB [birth year], MRN [id number]  PCP:  Shon Baton, MD  Cardiologist:  Jenne Campus, MD    Referring MD: Shon Baton, MD   Chief Complaint  Patient presents with   Follow-up    History of Present Illness:    Alejandro Foster is a 86 y.o. male with past medical history significant for coronary artery disease moderate 20 years ago he required PTCA and stenting.  Also does have history of hypertension, dyslipidemia, diabetes.  Comes today 2 months of follow-up overall doing well.  Denies having a chest pain tightness squeezing pressure burning chest.  He does have a dog named p.m. he walks with a dog couple times a day have no difficulty doing it.  Last time I did see him he was complaining of having some atypical chest pain, stress test was done after that on October 10, 2019 which showed no evidence of ischemia.  He presented today with some new complaint he said when he walks especially when he walks uphill he will get some tightness and burning in his legs.  He is concerned about it and that slows him down somewhat he said he stops his go away he described fairly typical claudications.  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 ([place]) 11/10/2019   Coronary artery disease    cardiologist-  dr Wynonia Lawman--  s/p  cardiac cath's w/ angioplasty and stenting x2   DDD (degenerative disc disease), lumbar    Diabetic peripheral neuropathy associated with type 2 diabetes mellitus ([place]) 12/12/2017   Dyslipidemia 01/25/2018   Dyspnea on exertion 08/22/2019   Foraminal stenosis of lumbar region 12/12/2017   History of kidney stones    Hyperlipidemia    Hypertension    Lumbar radiculopathy, right    right leg weakness   Myofascial pain syndrome 12/12/2017   Nephrolithiasis    bilateral  non-obstructive per ct 01-19-2016   Peripheral neuropathy    feet   Posterior vitreous detachment of both eyes 11/10/2019   Pseudophakia of both eyes 11/10/2019   Right leg weakness    due to lumbar radiculopathy   Right ureteral stone    S/P coronary artery stent placement    2002 x1  and 2003 x1   Spondylosis without myelopathy or radiculopathy, lumbar region 12/12/2017   Trigger finger 05/28/2018   Type 2 diabetes mellitus ([place])    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   COLONOSCOPY WITH PROPOFOL  2014   CORONARY ANGIOPLASTY  1990  dr Wynonia Lawman   PTCA to [place]  10/ 1996  dr Wynonia Lawman   PTCA to RCA   CORONARY ANGIOPLASTY WITH STENT PLACEMENT  04/ 1996  dr Wynonia Lawman   stenting to RCA  CYSTOSCOPY WITH URETEROSCOPY AND STENT PLACEMENT Right 03/28/2016   Procedure: CYSTOSCOPY WITH RIGHT  URETEROSCOPY AND STENT PLACEMENT;  Surgeon: Jerilee Field, MD;  Location: [facility];  Service: Urology;  Laterality: Right;   EXTRACORPOREAL SHOCK WAVE LITHOTRIPSY  yrs ago   HOLMIUM LASER APPLICATION Right 03/28/2016   Procedure: HOLMIUM LASER APPLICATION;  Surgeon: Jerilee Field, MD;  Location: [facility];  Service: Urology;  Laterality: Right;   LAPAROSCOPIC CHOLECYSTECTOMY  1990's   TONSILLECTOMY AND ADENOIDECTOMY  child   URETEROLITHOTOMY  1990's    Current Medications: Current Meds  Medication Sig   acetaminophen (TYLENOL) 325 MG tablet Take 325 mg  by mouth every 6 (six) hours as needed. Gel caps   amLODipine (NORVASC) 5 MG tablet Take 5 mg by mouth every evening.    aspirin EC 81 MG tablet Take 81 mg by mouth daily.   atorvastatin (LIPITOR) 20 MG tablet Take 20 mg by mouth every evening.    Calcium Citrate 250 MG TABS Take 250 mg by mouth daily.   cetirizine (ZYRTEC) 10 MG tablet Take 10 mg by mouth daily as needed.    Cholecalciferol (VITAMIN D PO) Take 5 drops by mouth every evening.    fenofibrate (TRICOR) 48 MG tablet Take 48 mg by mouth every evening.    gabapentin (NEURONTIN) 100 MG capsule Take 100-200 mg by mouth 2 (two) times daily. Take 100mg  at noon and then 200mg  at night   glipiZIDE-metformin (METAGLIP) 2.5-500 MG per tablet Take 2 tablets by mouth 2 (two) times daily before a meal.    HUMALOG MIX 75/25 KWIKPEN (75-25) 100 UNIT/ML Kwikpen Inject into the skin 2 (two) times daily. 35 units in the morning and then 22 units in the evening.   hydrochlorothiazide (HYDRODIURIL) 25 MG tablet Take 25 mg by mouth every morning.    Magnesium Citrate 125 MG CAPS Take 150 mg by mouth daily.   metoprolol tartrate (LOPRESSOR) 25 MG tablet Take 25 mg by mouth every morning.    Probiotic Product (PRO-BIOTIC BLEND PO) Take 1 capsule by mouth daily.   ramipril (ALTACE) 10 MG capsule Take 10 mg by mouth 2 (two) times daily.    solifenacin (VESICARE) 5 MG tablet Take 5 mg by mouth every morning.    zolpidem (AMBIEN) 10 MG tablet Take 10 mg by mouth at bedtime.     Allergies:   Sulfa antibiotics   Social History   Socioeconomic History   Marital status: Married    Spouse name: Not on file   Number of children: Not on file   Years of education: Not on file   Highest education level: Not on file  Occupational History   Not on file  Tobacco Use   Smoking status: Former Smoker    Years: 40.00    Types: Cigarettes    Quit date: 08/26/1988    Years since quitting: 31.5   Smokeless tobacco: Never Used  Substance  and Sexual Activity   Alcohol use: Yes    Comment: rare   Drug use: No   Sexual activity: Not on file  Other Topics Concern   Not on file  Social History Narrative   Not on file   Social Determinants of Health   Financial Resource Strain: Not on file  Food Insecurity: Not on file  Transportation Needs: Not on file  Physical Activity: Not on file  Stress: Not on file  Social Connections: Not on file     Family History: The  patient's family history is not on file. ROS:   Please see the history of present illness.    All 14 point review of systems negative except as described per history of present illness  EKGs/Labs/Other Studies Reviewed:      Recent Labs: No results found for requested labs within last 8760 hours.  Recent Lipid Panel No results found for: CHOL, TRIG, HDL, CHOLHDL, VLDL, LDLCALC, LDLDIRECT  Physical Exam:    VS:  BP 110/68 (BP Location: Right Arm, Patient Position: Sitting)    Pulse (!) 55    Ht 5\' 8"  (1.727 m)    Wt 180 lb (81.6 kg)    SpO2 94%    BMI 27.37 kg/m     Wt Readings from Last 3 Encounters:  03/17/20 180 lb (81.6 kg)  09/10/19 179 lb (81.2 kg)  08/22/19 179 lb 12.8 oz (81.6 kg)     GEN:  Well nourished, well developed in no acute distress HEENT: Normal NECK: No JVD; No carotid bruits LYMPHATICS: No lymphadenopathy CARDIAC: RRR, no murmurs, no rubs, no gallops RESPIRATORY:  Clear to auscultation without rales, wheezing or rhonchi  ABDOMEN: Soft, non-tender, non-distended MUSCULOSKELETAL:  No edema; No deformity  SKIN: Warm and dry LOWER EXTREMITIES: no swelling NEUROLOGIC:  Alert and oriented x 3 PSYCHIATRIC:  Normal affect   ASSESSMENT:    1. Coronary artery disease involving native coronary artery of native heart without angina pectoris   2. Type 2 diabetes mellitus with left eye affected by mild nonproliferative retinopathy without macular edema, with long-term current use of insulin (HCC)   3. Primary hypertension    4. Dyslipidemia   5. Claudication [facility])    PLAN:    In order of problems listed above:  1. Coronary disease stable from that point review stress test done few months ago showed no evidence of ischemia on antiplatelet therapy and statin will continue. 2. Type 2 diabetes he does have continuous glucose monitor.  I did review his K PN which did not have latest hemoglobin A1c. 3. Dyslipidemia he is taking statin which I will continue.  Reviewing of his K PN revealed LDL of 24 HDL 45.  We will continue present management. 4. Essential hypertension: His blood pressure seems to be well controlled we will continue present management. 5. Claudications.  Is a new problem.  I will schedule him to have ABI with arterial duplex of lower extremities.   Medication Adjustments/Labs and Tests Ordered: Current medicines are reviewed at length with the patient today.  Concerns regarding medicines are outlined above.  No orders of the defined types were placed in this encounter.  Medication changes: No orders of the defined types were placed in this encounter.   Signed, [facility], MD, [facility] 03/17/2020 9:26 AM    [facility]

## 2020-04-14 ENCOUNTER — Other Ambulatory Visit: Payer: Self-pay

## 2020-04-14 ENCOUNTER — Ambulatory Visit (HOSPITAL_BASED_OUTPATIENT_CLINIC_OR_DEPARTMENT_OTHER)
Admission: RE | Admit: 2020-04-14 | Discharge: 2020-04-14 | Disposition: A | Discharge status: Home / Self Care | Payer: Medicare Other | Source: Ambulatory Visit | Attending: Cardiology | Admitting: Cardiology

## 2020-04-14 DIAGNOSIS — I739 Peripheral vascular disease, unspecified: Secondary | ICD-10-CM | POA: Insufficient documentation

## 2020-09-07 ENCOUNTER — Ambulatory Visit: Payer: Medicare Other | Admitting: Cardiology

## 2020-09-07 ENCOUNTER — Encounter: Payer: Self-pay | Admitting: Cardiology

## 2020-09-07 ENCOUNTER — Other Ambulatory Visit: Payer: Self-pay

## 2020-09-07 VITALS — BP 118/60 | HR 62 | Ht 67.0 in | Wt 181.0 lb

## 2020-09-07 DIAGNOSIS — I493 Ventricular premature depolarization: Secondary | ICD-10-CM | POA: Diagnosis not present

## 2020-09-07 DIAGNOSIS — D692 Other nonthrombocytopenic purpura: Secondary | ICD-10-CM | POA: Insufficient documentation

## 2020-09-07 DIAGNOSIS — Z955 Presence of coronary angioplasty implant and graft: Secondary | ICD-10-CM

## 2020-09-07 DIAGNOSIS — I77819 Aortic ectasia, unspecified site: Secondary | ICD-10-CM

## 2020-09-07 DIAGNOSIS — I10 Essential (primary) hypertension: Secondary | ICD-10-CM | POA: Diagnosis not present

## 2020-09-07 DIAGNOSIS — I739 Peripheral vascular disease, unspecified: Secondary | ICD-10-CM

## 2020-09-07 NOTE — Patient Instructions (Addendum)
Medication Instructions:  Your physician recommends that you continue on your current medications as directed. Please refer to the Current Medication list given to you today.  *If you need a refill on your cardiac medications before your next appointment, please call your pharmacy*   Lab Work:  If you have labs (blood work) drawn today and your tests are completely normal, you will receive your results only by: [place] (if you have MyChart) OR A paper copy in the mail If you have any lab test that is abnormal or we need to change your treatment, we will call you to review the results.   Testing/Procedures:    Follow-Up: At [facility], you and your health needs are our priority.  As part of our continuing mission to provide you with exceptional heart care, we have created designated Provider Care Teams.  These Care Teams include your primary Cardiologist (physician) and Advanced Practice Providers (APPs -  Physician Assistants and Nurse Practitioners) who all work together to provide you with the care you need, when you need it.  We recommend signing up for the patient portal called "MyChart".  Sign up information is provided on this After Visit Summary.  MyChart is used to connect with patients for Virtual Visits (Telemedicine).  Patients are able to view lab/test results, encounter notes, upcoming appointments, etc.  Non-urgent messages can be sent to your provider as well.   To learn more about what you can do with MyChart, go to [email or web address].    Your next appointment:   6 month(s)  The format for your next appointment:   In Person  Provider:   Jenne Campus, MD   Other Instructions

## 2020-09-07 NOTE — Progress Notes (Signed)
Cardiology Office Note:    Date:  09/07/2020   ID:  Alejandro Foster, DOB [birth year], MRN [id number]  PCP:  Shon Baton, MD  Cardiologist:  Jenne Campus, MD    Referring MD: Shon Baton, MD   Chief Complaint  Patient presents with   Follow-up  And doing fine  History of Present Illness:    Alejandro Foster is a 87 y.o. male with past medical history significant for coronary artery disease status post PTCA and stenting many years ago, essential hypertension, dyslipidemia, diabetes, claudications.  He comes today 2 months of follow-up overall he is doing well he is upset because he was in [place] and trip to [place] was very difficult with a lot of delay in counseling of the flights, finally he was able to enjoy himself of that he was on a cruise in [place] as well as [place].  He said he was able to walk today with some difficulties but overall enjoyed himself.  Denies have any chest pain tightness squeezing pressure burning chest.  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 ([place]) 11/10/2019   Coronary artery disease    cardiologist-  dr Wynonia Lawman--  s/p  cardiac cath's w/ angioplasty and stenting x2   DDD (degenerative disc disease), lumbar    Diabetic peripheral neuropathy associated with type 2 diabetes mellitus ([place]) 12/12/2017   Dyslipidemia 01/25/2018   Dyspnea on exertion 08/22/2019   Foraminal stenosis of lumbar region 12/12/2017   History of kidney stones    Hyperlipidemia    Hypertension    Lumbar radiculopathy, right    right leg weakness   Myofascial pain syndrome 12/12/2017   Nephrolithiasis    bilateral non-obstructive per ct 01-19-2016   Peripheral neuropathy    feet   Posterior vitreous detachment of both eyes 11/10/2019   Pseudophakia of both eyes 11/10/2019   Right leg weakness    due to lumbar radiculopathy   Right ureteral stone    S/P  coronary artery stent placement    2002 x1  and 2003 x1   Spondylosis without myelopathy or radiculopathy, lumbar region 12/12/2017   Trigger finger 05/28/2018   Type 2 diabetes mellitus ([place])    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   COLONOSCOPY WITH PROPOFOL  2014   CORONARY ANGIOPLASTY  1990  dr Wynonia Lawman   PTCA to [place]  10/ 1996  dr Wynonia Lawman   PTCA to RCA   CORONARY ANGIOPLASTY WITH STENT PLACEMENT  04/ 1996  dr Wynonia Lawman   stenting to RCA   CYSTOSCOPY WITH URETEROSCOPY AND STENT PLACEMENT Right 03/28/2016   Procedure: CYSTOSCOPY WITH RIGHT  URETEROSCOPY AND STENT PLACEMENT;  Surgeon: Festus Aloe, MD;  Location: [facility];  Service: Urology;  Laterality: Right;   EXTRACORPOREAL SHOCK WAVE LITHOTRIPSY  yrs ago  HOLMIUM LASER APPLICATION Right 03/25/6061   Procedure: HOLMIUM LASER APPLICATION;  Surgeon: Festus Aloe, MD;  Location: [facility];  Service: Urology;  Laterality: Right;   LAPAROSCOPIC CHOLECYSTECTOMY  1990's   TONSILLECTOMY AND ADENOIDECTOMY  child   URETEROLITHOTOMY  1990's    Current Medications: Current Meds  Medication Sig   acetaminophen (TYLENOL) 325 MG tablet Take 325 mg by mouth every 6 (six) hours as needed for mild pain or moderate pain. Gel caps   amLODipine (NORVASC) 5 MG tablet Take 5 mg by mouth every evening.    aspirin EC 81 MG tablet Take 81 mg by mouth daily.   atorvastatin (LIPITOR) 20 MG tablet Take 20 mg by mouth every  evening.    cetirizine (ZYRTEC) 10 MG tablet Take 10 mg by mouth daily as needed for allergies.   Cholecalciferol (VITAMIN D PO) Take 5 drops by mouth every evening. Unknown strength   fenofibrate (TRICOR) 48 MG tablet Take 48 mg by mouth every evening.    gabapentin (NEURONTIN) 100 MG capsule Take 100-200 mg by mouth 2 (two) times daily. Take 100mg  at noon and then 200mg  at night   glipiZIDE-metformin (METAGLIP) 2.5-500 MG per tablet Take 2 tablets by mouth 2 (two) times daily before a meal.    HUMALOG MIX 75/25 KWIKPEN (75-25) 100 UNIT/ML Kwikpen Inject into the skin 2 (two) times daily. 35 units in the morning and then 22 units in the evening.   hydrochlorothiazide (HYDRODIURIL) 25 MG tablet Take 25 mg by mouth every morning.    Magnesium Citrate 125 MG CAPS Take 150 mg by mouth daily.   metoprolol tartrate (LOPRESSOR) 25 MG tablet Take 25 mg by mouth every morning.    Probiotic Product (PRO-BIOTIC BLEND PO) Take 1 capsule by mouth daily.   ramipril (ALTACE) 10 MG capsule Take 10 mg by mouth 2 (two) times daily.    solifenacin (VESICARE) 5 MG tablet Take 5 mg by mouth every morning.    zolpidem (AMBIEN) 10 MG tablet Take 10 mg by mouth at bedtime.     Allergies:   Sulfa antibiotics   Social History   Socioeconomic History   Marital status: Married    Spouse name: Not on file   Number of children: Not on file   Years of education: Not on file   Highest education level: Not on file  Occupational History   Not on file  Tobacco Use   Smoking status: Former    Years: 40.00    Pack years: 0.00    Types: Cigarettes    Quit date: 08/26/1988    Years since quitting: 32.0   Smokeless tobacco: Never  Substance and Sexual Activity   Alcohol use: Yes    Comment: rare   Drug use: No   Sexual activity: Not on file  Other Topics Concern   Not on file  Social History Narrative   Not on file   Social Determinants of Health   Financial Resource Strain: Not on file  Food Insecurity: Not  on file  Transportation Needs: Not on file  Physical Activity: Not on file  Stress: Not on file  Social Connections: Not on file     Family History: The patient's family history is not on file. ROS:   Please see the history of present illness.    All 14 point review of systems negative except as described per history of present illness  EKGs/Labs/Other Studies Reviewed:      Recent Labs: No results  found for requested labs within last 8760 hours.  Recent Lipid Panel No results found for: CHOL, TRIG, HDL, CHOLHDL, VLDL, LDLCALC, LDLDIRECT  Physical Exam:    VS:  BP 118/60 (BP Location: Right Arm, Patient Position: Sitting)   Pulse 62   Ht 5\' 7"  (1.702 m)   Wt 181 lb (82.1 kg)   SpO2 92%   BMI 28.35 kg/m     Wt Readings from Last 3 Encounters:  09/07/20 181 lb (82.1 kg)  03/17/20 180 lb (81.6 kg)  09/10/19 179 lb (81.2 kg)     GEN:  Well nourished, well developed in no acute distress HEENT: Normal NECK: No JVD; No carotid bruits LYMPHATICS: No lymphadenopathy CARDIAC: RRR, no murmurs, no rubs, no gallops RESPIRATORY:  Clear to auscultation without rales, wheezing or rhonchi  ABDOMEN: Soft, non-tender, non-distended MUSCULOSKELETAL:  No edema; No deformity  SKIN: Warm and dry LOWER EXTREMITIES: no swelling NEUROLOGIC:  Alert and oriented x 3 PSYCHIATRIC:  Normal affect   ASSESSMENT:    1. Ventricular premature depolarization   2. Dilatation of aorta (HCC)   3. Essential hypertension   4. S/P coronary artery stent placement   5. Claudication [facility])    PLAN:    In order of problems listed above:  PVCs.  Denies having any palpitations no dizziness no passing out. Dilatation of the aortic only mild.  His blood pressure is well controlled.  No symptoms.  We will reassess the situation When I see him Essential hypertension blood pressure well controlled continue present management Dyslipidemia I did review his K PN which show me LDL 24 HDL 45.  He is on  statin.  I wanted to repeat his fasting lipid profile but he does have appoint with his primary care physician within the next few weeks and he wanted his primary care physician to do it Claudication I did review his arterial duplex evaluation of lower extremities no critical lesion identified however he does have biphasic flow all the way through therefore more about inflow disease.  We will do ultrasounds of his abdomen and iliac vessels   Medication Adjustments/Labs and Tests Ordered: Current medicines are reviewed at length with the patient today.  Concerns regarding medicines are outlined above.  No orders of the defined types were placed in this encounter.  Medication changes: No orders of the defined types were placed in this encounter.   Signed, Park Liter, MD, [facility] 09/07/2020 10:39 AM    [place]

## 2020-10-07 ENCOUNTER — Other Ambulatory Visit: Payer: Self-pay

## 2020-10-07 ENCOUNTER — Ambulatory Visit (HOSPITAL_COMMUNITY)
Admission: RE | Admit: 2020-10-07 | Discharge: 2020-10-07 | Disposition: A | Discharge status: Home / Self Care | Payer: Medicare Other | Source: Ambulatory Visit | Attending: Cardiology | Admitting: Cardiology

## 2020-10-07 DIAGNOSIS — I700 Atherosclerosis of aorta: Secondary | ICD-10-CM | POA: Diagnosis not present

## 2020-10-07 DIAGNOSIS — Z955 Presence of coronary angioplasty implant and graft: Secondary | ICD-10-CM

## 2020-10-07 DIAGNOSIS — I493 Ventricular premature depolarization: Secondary | ICD-10-CM

## 2020-10-07 DIAGNOSIS — I77819 Aortic ectasia, unspecified site: Secondary | ICD-10-CM

## 2020-10-07 DIAGNOSIS — I739 Peripheral vascular disease, unspecified: Secondary | ICD-10-CM | POA: Diagnosis present

## 2020-10-07 DIAGNOSIS — I10 Essential (primary) hypertension: Secondary | ICD-10-CM

## 2020-10-11 ENCOUNTER — Telehealth: Payer: Self-pay

## 2020-10-11 NOTE — Telephone Encounter (Signed)
Left message on patients voicemail to please return our call.   

## 2020-10-11 NOTE — Telephone Encounter (Signed)
-----   Message from Park Liter, MD sent at 10/11/2020  9:13 AM EDT ----- Abdominal ultrasound did not show any critical stenosis or enlargement.  Overall looks good

## 2020-10-12 ENCOUNTER — Telehealth: Payer: Self-pay

## 2020-10-12 NOTE — Telephone Encounter (Signed)
Spoke with patient regarding results and recommendation.  Patient verbalizes understanding and is agreeable to plan of care. Advised patient to call back with any issues or concerns.  

## 2020-10-12 NOTE — Telephone Encounter (Signed)
-----   Message from Park Liter, MD sent at 10/11/2020  9:13 AM EDT ----- Abdominal ultrasound did not show any critical stenosis or enlargement.  Overall looks good

## 2020-11-09 ENCOUNTER — Ambulatory Visit (INDEPENDENT_AMBULATORY_CARE_PROVIDER_SITE_OTHER): Payer: Medicare Other | Admitting: Ophthalmology

## 2020-11-09 ENCOUNTER — Encounter (INDEPENDENT_AMBULATORY_CARE_PROVIDER_SITE_OTHER): Payer: Self-pay | Admitting: Ophthalmology

## 2020-11-09 ENCOUNTER — Other Ambulatory Visit: Payer: Self-pay

## 2020-11-09 DIAGNOSIS — E113292 Type 2 diabetes mellitus with mild nonproliferative diabetic retinopathy without macular edema, left eye: Secondary | ICD-10-CM | POA: Diagnosis not present

## 2020-11-09 DIAGNOSIS — Z794 Long term (current) use of insulin: Secondary | ICD-10-CM | POA: Diagnosis not present

## 2020-11-09 NOTE — Progress Notes (Signed)
11/09/2020     CHIEF COMPLAINT Patient presents for  Chief Complaint  Patient presents with   Retina Follow Up      HISTORY OF PRESENT ILLNESS: Alejandro Foster is a 87 y.o. male who presents to the clinic today for:   HPI     Retina Follow Up           Diagnosis: Diabetic Retinopathy   Laterality: both eyes   Onset: 1 year ago   Severity: moderate   Duration: 1 year   Course: stable   MD Performed: performed the HPI with the patient and updated documentation appropriately         Comments   1 yr fu ou oct Pt states he started noticing double vision when watching t.v. a couple months ago. "I think it is mostly my right eye. I notice flashes and floaters but those are normal, about the same as they have been." "I was diagnosed Type 2 Diabetic about 20 something years, controlled pretty much." A1C: 7.1 ~  BS: 165 this morning         Last edited by Laurin Coder, COA on 11/09/2020  8:25 AM.      Referring physician: Shon Baton, MD [place],  [place] [id number]  HISTORICAL INFORMATION:   Selected notes from the [facility]: No current outpatient medications on file. (Ophthalmic Drugs)   No current facility-administered medications for this visit. (Ophthalmic Drugs)   Current Outpatient Medications (Other)  Medication Sig   acetaminophen (TYLENOL) 325 MG tablet Take 325 mg by mouth every 6 (six) hours as needed for mild pain or moderate pain. Gel caps   amLODipine (NORVASC) 5 MG tablet Take 5 mg by mouth every evening.    aspirin EC 81 MG tablet Take 81 mg by mouth daily.   atorvastatin (LIPITOR) 20 MG tablet Take 20 mg by mouth every evening.    cetirizine (ZYRTEC) 10 MG tablet Take 10 mg by mouth daily as needed for allergies.   Cholecalciferol (VITAMIN D PO) Take 5 drops by mouth every evening. Unknown strength   fenofibrate (TRICOR) 48 MG tablet Take 48 mg by mouth every evening.    gabapentin  (NEURONTIN) 100 MG capsule Take 100-200 mg by mouth 2 (two) times daily. Take '100mg'$  at noon and then '200mg'$  at night   glipiZIDE-metformin (METAGLIP) 2.5-500 MG per tablet Take 2 tablets by mouth 2 (two) times daily before a meal.    HUMALOG MIX 75/25 KWIKPEN (75-25) 100 UNIT/ML Kwikpen Inject into the skin 2 (two) times daily. 35 units in the morning and then 22 units in the evening.   hydrochlorothiazide (HYDRODIURIL) 25 MG tablet Take 25 mg by mouth every morning.    Magnesium Citrate 125 MG CAPS Take 150 mg by mouth daily.   metoprolol tartrate (LOPRESSOR) 25 MG tablet Take 25 mg by mouth every morning.    Probiotic Product (PRO-BIOTIC BLEND PO) Take 1 capsule by mouth daily.   ramipril (ALTACE) 10 MG capsule Take 10 mg by mouth 2 (two) times daily.    solifenacin (VESICARE) 5 MG tablet Take 5 mg by mouth every morning.    zolpidem (AMBIEN) 10 MG tablet Take 10 mg by mouth at bedtime.   No current facility-administered medications for this visit. (Other)      REVIEW OF SYSTEMS:    ALLERGIES Allergies  Allergen Reactions   Sulfa Antibiotics Other (See Comments)  Unknown reaction- childhood allergy    PAST MEDICAL HISTORY 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 ([place]) 11/10/2019   Coronary artery disease    cardiologist-  dr Wynonia Lawman--  s/p  cardiac cath's w/ angioplasty and stenting x2   DDD (degenerative disc disease), lumbar    Diabetic peripheral neuropathy associated with type 2 diabetes mellitus ([place]) 12/12/2017   Dyslipidemia 01/25/2018   Dyspnea on exertion 08/22/2019   Foraminal stenosis of lumbar region 12/12/2017   History of kidney stones    Hyperlipidemia    Hypertension    Lumbar radiculopathy, right    right leg weakness   Myofascial pain syndrome 12/12/2017   Nephrolithiasis    bilateral non-obstructive per ct 01-19-2016    Peripheral neuropathy    feet   Posterior vitreous detachment of both eyes 11/10/2019   Pseudophakia of both eyes 11/10/2019   Right leg weakness    due to lumbar radiculopathy   Right ureteral stone    S/P coronary artery stent placement    2002 x1  and 2003 x1   Spondylosis without myelopathy or radiculopathy, lumbar region 12/12/2017   Trigger finger 05/28/2018   Type 2 diabetes mellitus ([place])    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) 11/10/2019   Wears hearing aid    BILATERAL   Past Surgical History:  Procedure Laterality Date   CARDIAC CATHETERIZATION  01/ 2002   dr Wynonia Lawman   mLAD 30%,  CFX OM3 40%,  RCA 30% previous stent site   CARDIOVASCULAR STRESS TEST  04-15-2009  dr Wynonia Lawman   normal nuclear study w/ no ischemia/  normal LV function and wall motion , ef 77%   CATARACT EXTRACTION W/ INTRAOCULAR LENS  IMPLANT, BILATERAL  2013  approx.   COLONOSCOPY WITH PROPOFOL  2014   CORONARY ANGIOPLASTY  1990  dr Wynonia Lawman   PTCA to [place]  10/ 1996  dr Wynonia Lawman   PTCA to RCA   CORONARY ANGIOPLASTY WITH STENT PLACEMENT  04/ 1996  dr Wynonia Lawman   stenting to RCA   CYSTOSCOPY WITH URETEROSCOPY AND STENT PLACEMENT Right 03/28/2016   Procedure: CYSTOSCOPY WITH RIGHT  URETEROSCOPY AND STENT PLACEMENT;  Surgeon: Festus Aloe, MD;  Location: [facility];  Service: Urology;  Laterality: Right;   EXTRACORPOREAL SHOCK WAVE LITHOTRIPSY  yrs ago   HOLMIUM LASER APPLICATION Right [id number]   Procedure: HOLMIUM LASER APPLICATION;  Surgeon: Festus Aloe, MD;  Location[facility];  Service: Urology;  Laterality: Right;   LAPAROSCOPIC CHOLECYSTECTOMY  1990's   TONSILLECTOMY AND ADENOIDECTOMY  child   URETEROLITHOTOMY  1990's    FAMILY HISTORY History reviewed. No pertinent family history.  SOCIAL HISTORY Social History   Tobacco Use   Smoking status: Former    Years: 40.00    Types:  Cigarettes    Quit date: 08/26/1988    Years since quitting: 32.2   Smokeless tobacco: Never  Substance Use Topics   Alcohol use: Yes    Comment: rare   Drug use: No         OPHTHALMIC EXAM:  Base Eye Exam     Visual Acuity (Snellen - Linear)       Right Left   Dist [place] 20/25 -2 20/25         Tonometry (Tonopen, 8:18  AM)       Right Left   Pressure 14 12         Pupils       Pupils Shape APD   Right PERRL Round None   Left PERRL Round None         Visual Fields (Counting fingers)       Left Right    Full          Extraocular Movement       Right Left    Full Full         Neuro/Psych     Oriented x3: Yes   Mood/Affect: Normal         Dilation     Both eyes: 1.0% Mydriacyl, 2.5% Phenylephrine @ 8:20 AM           Slit Lamp and Fundus Exam     External Exam       Right Left   External Normal Normal         Slit Lamp Exam       Right Left   Lids/Lashes Normal Normal   Conjunctiva/Sclera White and quiet White and quiet   Cornea Clear Clear   Anterior Chamber Deep and quiet Deep and quiet   Iris Round and reactive Round and reactive   Lens Centered posterior chamber intraocular lens Centered posterior chamber intraocular lens   Anterior Vitreous Normal Normal         Fundus Exam       Right Left   Posterior Vitreous Posterior vitreous detachment Posterior vitreous detachment   Disc Normal Normal   C/D Ratio 0.35 0.4   Macula Normal Normal   Vessels no DR no DR   Periphery Normal Normal            IMAGING AND PROCEDURES  Imaging and Procedures for 11/09/20  OCT, Retina - OU - Both Eyes       Right Eye Quality was good. Scan locations included subfoveal. Central Foveal Thickness: 267. Progression has been stable. Findings include normal foveal contour.   Left Eye Quality was good. Scan locations included subfoveal. Central Foveal Thickness: 267. Progression has been stable. Findings include normal foveal  contour.   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 Emphasis was placed on tight glucose, blood pressure, and serum lipid control. Avoidance of smoking was emphasized. Maintenance of normal body weight was emphasized. Appropriate follow up dilated exam is 1 year.  Stable      ICD-10-CM   1. Type 2 diabetes mellitus with left eye affected by mild nonproliferative retinopathy without macular edema, with long-term current use of insulin (HCC)  E11.3292 OCT, Retina - OU - Both Eyes   Z79.4       1.  Only mild NPDR noted in either eye.  This is a fantastic appearance for a 30-year duration of diabetes mellitus.  This represents effective blood sugar control and its subsequent lack of progression of diabetic retinopathy will observe  2.  3.  Ophthalmic Meds Ordered this visit:  No orders of the defined types were placed in this encounter.      Return in about 1 year (around 11/09/2021) for DILATE OU, COLOR FP.  There are no Patient Instructions  on file for this visit.   Explained the diagnoses, plan, and follow up with the patient and they expressed understanding.  Patient expressed understanding of the importance of proper follow up care.   Clent Demark Mathew Postiglione M.D. Diseases & Surgery of the Retina and Vitreous [facility] [place] 11/09/20     Abbreviations: M myopia (nearsighted); A astigmatism; H hyperopia (farsighted); P presbyopia; Mrx spectacle prescription;  CTL contact lenses; OD right eye; OS left eye; OU both eyes  XT exotropia; ET esotropia; PEK punctate epithelial keratitis; PEE punctate epithelial erosions; DES dry eye syndrome; MGD meibomian gland dysfunction; ATs artificial tears; PFAT's preservative free  artificial tears; [place] nuclear sclerotic cataract; PSC posterior subcapsular cataract; ERM epi-retinal membrane; PVD posterior vitreous detachment; RD retinal detachment; DM diabetes mellitus; DR diabetic retinopathy; NPDR non-proliferative diabetic retinopathy; PDR proliferative diabetic retinopathy; CSME clinically significant macular edema; DME diabetic macular edema; dbh dot blot hemorrhages; CWS cotton wool spot; POAG primary open angle glaucoma; C/D cup-to-disc ratio; HVF humphrey visual field; GVF goldmann visual field; OCT optical coherence tomography; IOP intraocular pressure; BRVO Branch retinal vein occlusion; CRVO central retinal vein occlusion; CRAO central retinal artery occlusion; BRAO branch retinal artery occlusion; RT retinal tear; SB scleral buckle; PPV pars plana vitrectomy; VH Vitreous hemorrhage; PRP panretinal laser photocoagulation; IVK intravitreal kenalog; VMT vitreomacular traction; MH Macular hole;  NVD neovascularization of the disc; NVE neovascularization elsewhere; AREDS age related eye disease study; ARMD age related macular degeneration; POAG primary open angle glaucoma; EBMD epithelial/anterior basement membrane dystrophy; ACIOL anterior chamber intraocular lens; IOL intraocular lens; PCIOL posterior chamber intraocular lens; Phaco/IOL phacoemulsification with intraocular lens placement; [place] photorefractive keratectomy; LASIK laser assisted in situ keratomileusis; HTN hypertension; DM diabetes mellitus; COPD chronic obstructive pulmonary disease

## 2020-11-09 NOTE — Assessment & Plan Note (Signed)
The nature of mild nonproliferative diabetic retinopathy was discussed with the patient. Emphasis was placed on tight glucose, blood pressure, and serum lipid control. Avoidance of smoking was emphasized. Maintenance of normal body weight was emphasized. Appropriate follow up dilated exam is 1 year.  Stable

## 2021-04-20 ENCOUNTER — Encounter (INDEPENDENT_AMBULATORY_CARE_PROVIDER_SITE_OTHER): Payer: Self-pay

## 2021-11-03 ENCOUNTER — Ambulatory Visit (INDEPENDENT_AMBULATORY_CARE_PROVIDER_SITE_OTHER): Payer: Medicare Other | Admitting: Ophthalmology

## 2021-11-03 ENCOUNTER — Encounter (INDEPENDENT_AMBULATORY_CARE_PROVIDER_SITE_OTHER): Payer: Self-pay | Admitting: Ophthalmology

## 2021-11-03 DIAGNOSIS — E113292 Type 2 diabetes mellitus with mild nonproliferative diabetic retinopathy without macular edema, left eye: Secondary | ICD-10-CM | POA: Diagnosis not present

## 2021-11-03 DIAGNOSIS — H43813 Vitreous degeneration, bilateral: Secondary | ICD-10-CM

## 2021-11-03 DIAGNOSIS — Z961 Presence of intraocular lens: Secondary | ICD-10-CM | POA: Diagnosis not present

## 2021-11-03 DIAGNOSIS — Z794 Long term (current) use of insulin: Secondary | ICD-10-CM

## 2021-11-03 DIAGNOSIS — H35433 Paving stone degeneration of retina, bilateral: Secondary | ICD-10-CM | POA: Diagnosis not present

## 2021-11-03 NOTE — Assessment & Plan Note (Signed)
Age-appropriate

## 2021-11-03 NOTE — Assessment & Plan Note (Signed)

## 2021-11-03 NOTE — Assessment & Plan Note (Signed)
Stable OU 

## 2021-11-03 NOTE — Progress Notes (Signed)
11/03/2021     CHIEF COMPLAINT Patient presents for  Chief Complaint  Patient presents with   Diabetic Retinopathy without Macular Edema      HISTORY OF PRESENT ILLNESS: Alejandro Foster is a 88 y.o. male who presents to the clinic today for:   HPI   11 MOS FU OU FP Pt stated, "In the mornings, I would see double vision but it would only last a few minutes and gradually clears up throughout the day. Recently, my vision has been slightly fuzzy in both eye." Pt denies new floaters and FOL.   Last edited by Silvestre Moment on 11/03/2021  8:01 AM.      Referring physician: Shon Baton, MD [place],  [place] [address]  HISTORICAL INFORMATION:   Selected notes from the [facility]: No current outpatient medications on file. (Ophthalmic Drugs)   No current facility-administered medications for this visit. (Ophthalmic Drugs)   Current Outpatient Medications (Other)  Medication Sig   acetaminophen (TYLENOL) 325 MG tablet Take 325 mg by mouth every 6 (six) hours as needed for mild pain or moderate pain. Gel caps   amLODipine (NORVASC) 5 MG tablet Take 5 mg by mouth every evening.    aspirin EC 81 MG tablet Take 81 mg by mouth daily.   atorvastatin (LIPITOR) 20 MG tablet Take 20 mg by mouth every evening.    cetirizine (ZYRTEC) 10 MG tablet Take 10 mg by mouth daily as needed for allergies.   Cholecalciferol (VITAMIN D PO) Take 5 drops by mouth every evening. Unknown strength   fenofibrate (TRICOR) 48 MG tablet Take 48 mg by mouth every evening.    gabapentin (NEURONTIN) 100 MG capsule Take 100-200 mg by mouth 2 (two) times daily. Take '100mg'$  at noon and then '200mg'$  at night   glipiZIDE-metformin (METAGLIP) 2.5-500 MG per tablet Take 2 tablets by mouth 2 (two) times daily before a meal.    HUMALOG MIX 75/25 KWIKPEN (75-25) 100 UNIT/ML Kwikpen Inject into the skin 2 (two) times daily. 35 units in the morning and then 22 units in the evening.    hydrochlorothiazide (HYDRODIURIL) 25 MG tablet Take 25 mg by mouth every morning.    Magnesium Citrate 125 MG CAPS Take 150 mg by mouth daily.   metoprolol tartrate (LOPRESSOR) 25 MG tablet Take 25 mg by mouth every morning.    Probiotic Product (PRO-BIOTIC BLEND PO) Take 1 capsule by mouth daily.   ramipril (ALTACE) 10 MG capsule Take 10 mg by mouth 2 (two) times daily.    solifenacin (VESICARE) 5 MG tablet Take 5 mg by mouth every morning.    zolpidem (AMBIEN) 10 MG tablet Take 10 mg by mouth at bedtime.   No current facility-administered medications for this visit. (Other)      REVIEW OF SYSTEMS: ROS   Negative for: Constitutional, Gastrointestinal, Neurological, Skin, Genitourinary, Musculoskeletal, HENT, Endocrine, Cardiovascular, Eyes, Respiratory, Psychiatric, Allergic/Imm, Heme/Lymph Last edited by Silvestre Moment on 11/03/2021  8:01 AM.       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 ([place]) 11/10/2019   Coronary artery disease    cardiologist-  dr Wynonia Lawman--  s/p  cardiac cath's w/  angioplasty and stenting x2   DDD (degenerative disc disease), lumbar    Diabetic peripheral neuropathy associated with type 2 diabetes mellitus ([place]) 12/12/2017   Dyslipidemia 01/25/2018   Dyspnea on exertion 08/22/2019   Foraminal stenosis of lumbar region 12/12/2017   History of kidney stones    Hyperlipidemia    Hypertension    Lumbar radiculopathy, right    right leg weakness   Myofascial pain syndrome 12/12/2017   Nephrolithiasis    bilateral non-obstructive per ct 01-19-2016   Peripheral neuropathy    feet   Posterior vitreous detachment of both eyes 11/10/2019   Pseudophakia of both eyes 11/10/2019   Right leg  weakness    due to lumbar radiculopathy   Right ureteral stone    S/P coronary artery stent placement    2002 x1  and 2003 x1   Spondylosis without myelopathy or radiculopathy, lumbar region 12/12/2017   Trigger finger 05/28/2018   Type 2 diabetes mellitus ([place])    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   COLONOSCOPY WITH PROPOFOL  2014   CORONARY ANGIOPLASTY  1990  dr Wynonia Lawman   PTCA to [place]  10/ 1996  dr Wynonia Lawman   PTCA to RCA   CORONARY ANGIOPLASTY WITH STENT PLACEMENT  04/ 1996  dr Wynonia Lawman   stenting to RCA   CYSTOSCOPY WITH URETEROSCOPY AND STENT PLACEMENT Right 03/28/2016   Procedure: CYSTOSCOPY WITH RIGHT  URETEROSCOPY AND STENT PLACEMENT;  Surgeon: Festus Aloe, MD;  Location: [facility];  Service: Urology;  Laterality: Right;   EXTRACORPOREAL SHOCK WAVE LITHOTRIPSY  yrs ago   HOLMIUM LASER APPLICATION Right 04/25/2033   Procedure: HOLMIUM LASER APPLICATION;  Surgeon: Festus Aloe, MD;  Location: [facility];  Service: Urology;  Laterality: Right;   LAPAROSCOPIC CHOLECYSTECTOMY  1990's   TONSILLECTOMY AND ADENOIDECTOMY  child   URETEROLITHOTOMY  1990's    FAMILY HISTORY History reviewed. No pertinent family history.  SOCIAL HISTORY Social History   Tobacco Use   Smoking status: Former    Years: 40.00    Types: Cigarettes    Quit date: 08/26/1988    Years since quitting: 33.2   Smokeless tobacco: Never  Substance Use Topics   Alcohol use: Yes     Comment: rare   Drug use: No         OPHTHALMIC EXAM:  Base Eye Exam     Visual Acuity (ETDRS)       Right Left   Dist [place] 20/25 -2 20/25 -2         Tonometry (Tonopen, 8:05 AM)       Right Left   Pressure 16 15         Pupils       Pupils APD   Right PERRL None   Left PERRL None         Visual Fields       Left Right  Full Full         Extraocular Movement       Right Left    Full, Ortho Full, Ortho         Neuro/Psych     Oriented x3: Yes   Mood/Affect: Normal         Dilation     Both eyes: 1.0% Mydriacyl, 2.5% Phenylephrine @ 8:05 AM           Slit Lamp and Fundus Exam     External Exam       Right Left   External Normal Normal         Slit Lamp Exam       Right Left   Lids/Lashes Normal Normal   Conjunctiva/Sclera White and quiet White and quiet   Cornea Clear Clear   Anterior Chamber Deep and quiet Deep and quiet   Iris Round and reactive Round and reactive   Lens Centered posterior chamber intraocular lens Centered posterior chamber intraocular lens   Anterior Vitreous Normal Normal         Fundus Exam       Right Left   Posterior Vitreous Posterior vitreous detachment Posterior vitreous detachment   Disc Normal Normal   C/D Ratio 0.35 0.4   Macula Normal Normal   Vessels no DR no DR   Periphery Reticular degeneration Reticular degeneration            IMAGING AND PROCEDURES  Imaging and Procedures for 11/03/21  Color Fundus Photography Optos - OU - Both Eyes       Right Eye Progression has no prior data. Disc findings include normal observations. Macula : normal observations. Vessels : normal observations. Periphery : normal observations, degeneration.   Left Eye Progression has no prior data. Macula : normal observations. Vessels : normal observations. Periphery : degeneration.   Notes Benign reticular RPE degeneration the retinal periphery OU.     OCT, Retina - OU - Both Eyes        Right Eye Quality was good. Scan locations included subfoveal. Central Foveal Thickness: 267. Progression has been stable. Findings include normal foveal contour.   Left Eye Quality was good. Scan locations included subfoveal. Central Foveal Thickness: 267. Progression has been stable. Findings include normal foveal contour.   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 Checking an Amsler Grid daily with advice to return immediately should a distortion develop, was given to the patient. The patient 's smoking status now and in the past was determined and advice based on the AREDS study was provided regarding the consumption of antioxidant supplements. AREDS 2 vitamin formulation was recommended. Consumption of dark leafy vegetables and fresh fruits of various colors was recommended. Treatment modalities for wet macular degeneration particularly the use of intravitreal injections of anti-blood vessel growth factors was discussed with the patient. Avastin, Lucentis, and Eylea are the available options. On occasion, therapy includes the use of photodynamic therapy and thermal laser. Stressed to the patient do not rub eyes.  Patient was advised to check Amsler Grid daily and return immediately if changes are noted. Instructions on using the grid were given to the patient. All patient questions were answered.  Posterior vitreous detachment of both eyes  The nature of posterior vitreous detachment was discussed with the patient as well as its physiology, its age prevalence, and its possible implication regarding retinal breaks and detachment.  An informational brochure was offered to the patient.  All the  patient's questions were answered.  The patient was asked to return if new or different flashes or floaters develops.   Patient was instructed to contact office immediately if any new changes were noticed. I explained to the patient that vitreous inside the eye is similar to jello inside a bowl. As the jello melts it can start to pull away from the bowl, similarly the vitreous throughout our lives can begin to pull away from the retina. That process is called a posterior vitreous detachment. In some cases, the vitreous can tug hard enough on the retina to form a retinal tear. I discussed with the patient the signs and symptoms of a retinal detachment.  Do not rub the eye.    Pseudophakia of both eyes Stable OU  Paving stone retinal degeneration, bilateral Age-appropriate     ICD-10-CM   1. Type 2 diabetes mellitus with left eye affected by mild nonproliferative retinopathy without macular edema, with long-term current use of insulin (HCC)  Q68.3419 Color Fundus Photography Optos - OU - Both Eyes   Z79.4 OCT, Retina - OU - Both Eyes    2. Posterior vitreous detachment of both eyes  H43.813     3. Pseudophakia of both eyes  Z96.1     4. Paving stone retinal degeneration, bilateral  H35.433       1.  No active maculopathy OU.  Only mild NPDR OU.  2.  Physiologic PVD OU  3.  Ophthalmic Meds Ordered this visit:  No orders of the defined types were placed in this encounter.      Return in about 1 year (around 11/04/2022) for DILATE OU, COLOR FP, OCT.  There are no Patient Instructions on file for this visit.   Explained the diagnoses, plan, and follow up with the patient and they expressed understanding.  Patient expressed understanding of the importance of proper follow up care.   Clent Demark Wynter Isaacs M.D. Diseases & Surgery of the Retina and Vitreous Retina & Diabetic [place] 11/03/21     Abbreviations: M myopia (nearsighted); A astigmatism; H hyperopia (farsighted); P  presbyopia; Mrx spectacle prescription;  CTL contact lenses; OD right eye; OS left eye; OU both eyes  XT exotropia; ET esotropia; PEK punctate epithelial keratitis; PEE punctate epithelial erosions; DES dry eye syndrome; MGD meibomian gland dysfunction; ATs artificial tears; PFAT's preservative free artificial tears; [place] nuclear sclerotic cataract; PSC posterior subcapsular cataract; ERM epi-retinal membrane; PVD posterior vitreous detachment; RD retinal detachment; DM diabetes mellitus; DR diabetic retinopathy; NPDR non-proliferative diabetic retinopathy; PDR proliferative diabetic retinopathy; CSME clinically significant macular edema; DME diabetic macular edema; dbh dot blot hemorrhages; CWS cotton wool spot; POAG primary open angle glaucoma; C/D cup-to-disc ratio; HVF humphrey visual field; GVF goldmann visual field; OCT optical coherence tomography; IOP intraocular pressure; BRVO Branch retinal vein occlusion; CRVO central retinal vein occlusion; CRAO central retinal artery occlusion; BRAO branch retinal artery occlusion; RT retinal tear; SB scleral buckle; PPV pars plana vitrectomy; VH Vitreous hemorrhage; PRP panretinal laser photocoagulation; IVK intravitreal kenalog; VMT vitreomacular traction; MH Macular hole;  NVD neovascularization of the disc; NVE neovascularization elsewhere; AREDS age related eye disease study; ARMD age related macular degeneration; POAG primary open angle glaucoma; EBMD epithelial/anterior basement membrane  dystrophy; ACIOL anterior chamber intraocular lens; IOL intraocular lens; PCIOL posterior chamber intraocular lens; Phaco/IOL phacoemulsification with intraocular lens placement; [place] photorefractive keratectomy; LASIK laser assisted in situ keratomileusis; HTN hypertension; DM diabetes mellitus; COPD chronic obstructive pulmonary disease

## 2021-11-03 NOTE — Assessment & Plan Note (Signed)

## 2021-11-10 ENCOUNTER — Encounter (INDEPENDENT_AMBULATORY_CARE_PROVIDER_SITE_OTHER): Payer: Medicare Other | Admitting: Ophthalmology

## 2022-01-12 ENCOUNTER — Emergency Department (HOSPITAL_BASED_OUTPATIENT_CLINIC_OR_DEPARTMENT_OTHER)
Admission: EM | Admit: 2022-01-12 | Discharge: 2022-01-12 | Disposition: A | Discharge status: Home / Self Care | Payer: Medicare Other | Attending: Emergency Medicine | Admitting: Emergency Medicine

## 2022-01-12 ENCOUNTER — Encounter (HOSPITAL_BASED_OUTPATIENT_CLINIC_OR_DEPARTMENT_OTHER): Payer: Self-pay

## 2022-01-12 ENCOUNTER — Other Ambulatory Visit (HOSPITAL_BASED_OUTPATIENT_CLINIC_OR_DEPARTMENT_OTHER): Payer: Self-pay

## 2022-01-12 ENCOUNTER — Other Ambulatory Visit: Payer: Self-pay

## 2022-01-12 DIAGNOSIS — Z7982 Long term (current) use of aspirin: Secondary | ICD-10-CM | POA: Diagnosis not present

## 2022-01-12 DIAGNOSIS — H60501 Unspecified acute noninfective otitis externa, right ear: Secondary | ICD-10-CM | POA: Diagnosis not present

## 2022-01-12 DIAGNOSIS — I10 Essential (primary) hypertension: Secondary | ICD-10-CM | POA: Insufficient documentation

## 2022-01-12 DIAGNOSIS — Z79899 Other long term (current) drug therapy: Secondary | ICD-10-CM | POA: Diagnosis not present

## 2022-01-12 DIAGNOSIS — Z7984 Long term (current) use of oral hypoglycemic drugs: Secondary | ICD-10-CM | POA: Diagnosis not present

## 2022-01-12 DIAGNOSIS — Z794 Long term (current) use of insulin: Secondary | ICD-10-CM | POA: Diagnosis not present

## 2022-01-12 DIAGNOSIS — E1140 Type 2 diabetes mellitus with diabetic neuropathy, unspecified: Secondary | ICD-10-CM | POA: Diagnosis not present

## 2022-01-12 DIAGNOSIS — H9201 Otalgia, right ear: Secondary | ICD-10-CM | POA: Diagnosis present

## 2022-01-12 MED ORDER — CIPROFLOXACIN-DEXAMETHASONE 0.3-0.1 % OT SUSP
4.0000 [drp] | Freq: Two times a day (BID) | OTIC | 0 refills | Status: AC
  Filled 2022-01-12: qty 7.5, 19d supply, fill #0

## 2022-01-12 NOTE — ED Notes (Signed)
Discharge instructions discussed with pt. Pt verbalized understanding. Pt stable and ambulatory.  °

## 2022-01-12 NOTE — ED Provider Notes (Signed)
[facility] EMERGENCY DEPT Provider Note   CSN: [id number] Arrival date & time: 01/12/22  1520     History  Chief Complaint  Patient presents with   Foreign Body in Alejandro Foster is a 88 y.o. male.   Foreign Body in [name]   88 year old male presents emergency department with foreign body sensation in right ear.  He states his symptoms began yesterday.  Sensation feels like itching/scratching with some pain.  He is worried about possible bug in ear although is not aware of within or above, actually in his ear.  States he wears bilateral hearing aids and has had his right one removed since symptom onset.  Denies fever, difficulty hearing.  Past medical history significant for diabetes mellitus type 2, hypertension, hyperlipidemia, diabetic neuropathy  Home Medications Prior to Admission medications   Medication Sig Start Date End Date Taking? Authorizing Provider  ciprofloxacin-dexamethasone (CIPRODEX) OTIC suspension Place 4 drops into the right ear 2 (two) times daily for 7 days. 01/12/22 01/19/22 Yes Dion Saucier A, PA  acetaminophen (TYLENOL) 325 MG tablet Take 325 mg by mouth every 6 (six) hours as needed for mild pain or moderate pain. Gel caps    [provider]  amLODipine (NORVASC) 5 MG tablet Take 5 mg by mouth every evening.     [provider]  aspirin EC 81 MG tablet Take 81 mg by mouth daily.    [provider]  atorvastatin (LIPITOR) 20 MG tablet Take 20 mg by mouth every evening.     [provider]  cetirizine (ZYRTEC) 10 MG tablet Take 10 mg by mouth daily as needed for allergies.    [provider]  Cholecalciferol (VITAMIN D PO) Take 5 drops by mouth every evening. Unknown strength    [provider]  fenofibrate (TRICOR) 48 MG tablet Take 48 mg by mouth every evening.     [provider]  gabapentin (NEURONTIN) 100 MG capsule Take 100-200 mg by mouth 2 (two) times daily. Take  '100mg'$  at noon and then '200mg'$  at night    [provider]  glipiZIDE-metformin (METAGLIP) 2.5-500 MG per tablet Take 2 tablets by mouth 2 (two) times daily before a meal.     [provider]  HUMALOG MIX 75/25 KWIKPEN (75-25) 100 UNIT/ML Kwikpen Inject into the skin 2 (two) times daily. 35 units in the morning and then 22 units in the evening. 12/31/15   [provider]  hydrochlorothiazide (HYDRODIURIL) 25 MG tablet Take 25 mg by mouth every morning.     [provider]  Magnesium Citrate 125 MG CAPS Take 150 mg by mouth daily.    [provider]  metoprolol tartrate (LOPRESSOR) 25 MG tablet Take 25 mg by mouth every morning.     [provider]  Probiotic Product (PRO-BIOTIC BLEND PO) Take 1 capsule by mouth daily.    [provider]  ramipril (ALTACE) 10 MG capsule Take 10 mg by mouth 2 (two) times daily.  11/29/16   [provider]  solifenacin (VESICARE) 5 MG tablet Take 5 mg by mouth every morning.     [provider]  zolpidem (AMBIEN) 10 MG tablet Take 10 mg by mouth at bedtime.    [provider]      Allergies    Sulfa antibiotics    Review of Systems   Review of Systems  All other systems reviewed and are negative.   Physical Exam Updated Vital Signs  BP (!) 131/59 (BP Location: Left Arm)   Pulse 64   Temp 97.9 F (36.6 C)   Resp 16   Ht '5\' 7"'$  (1.702 m)   Wt 82.1 kg   SpO2 96%   BMI 28.35 kg/m  Physical Exam Vitals and nursing note reviewed.  Constitutional:      General: He is not in acute distress.    Appearance: He is well-developed.  HENT:     Head: Normocephalic and atraumatic.     Right Ear: Tympanic membrane, ear canal and external ear normal.     Left Ear: Tympanic membrane, ear canal and external ear normal.     Ears:     Comments: Right ear canal mildly erythematous with no obvious discharge.  No evidence of foreign body.  Mild cerumen in left ear with no evidence of  impaction. Eyes:     Conjunctiva/sclera: Conjunctivae normal.  Cardiovascular:     Rate and Rhythm: Normal rate and regular rhythm.     Heart sounds: No murmur heard. Pulmonary:     Effort: Pulmonary effort is normal. No respiratory distress.     Breath sounds: Normal breath sounds.  Abdominal:     Palpations: Abdomen is soft.     Tenderness: There is no abdominal tenderness.  Musculoskeletal:        General: No swelling.     Cervical back: Neck supple.  Skin:    General: Skin is warm and dry.     Capillary Refill: Capillary refill takes less than 2 seconds.  Neurological:     Mental Status: He is alert.  Psychiatric:        Mood and Affect: Mood normal.     ED Results / Procedures / Treatments   Labs (all labs ordered are listed, but only abnormal results are displayed) Labs Reviewed - No data to display  EKG None  Radiology No results found.  Procedures Procedures    Medications Ordered in ED Medications - No data to display  ED Course/ Medical Decision Making/ A&P                           Medical Decision Making Risk Prescription drug management.   This patient presents to the ED for concern of ear pain, this involves an extensive number of treatment options, and is a complaint that carries with it a high risk of complications and morbidity.  The differential diagnosis includes perforated TM, otitis media, otitis externa, foreign body, mastoiditis, cellulitis, erysipelas   Co morbidities that complicate the patient evaluation  See HPI   Additional history obtained:  Additional history obtained from EMR External records from outside source obtained and reviewed including hospital records   Lab Tests:  N/a   Imaging Studies ordered:  N/a   Cardiac Monitoring: / EKG:  The patient was maintained on a cardiac monitor.  I personally viewed and interpreted the cardiac monitored which showed an underlying rhythm of: Sinus  rhythm   Consultations Obtained:  N/a   Problem List / ED Course / Critical interventions / Medication management  Otitis externa Reevaluation of the patient showed that the patient stayed the same I have reviewed the patients home medicines and have made adjustments as needed   Social Determinants of Health:  Denies tobacco, illicit drug use   Test / Admission - Considered:  Otitis externa Vitals signs  within normal range and stable throughout visit. Patient symptoms likely secondary to otitis  externa as indicated above.  Topical antibiotics will be prescribed.  Treatment plan is custom at length with patient he did not dressing was agreeable to said plan.  Close follow-up with PCP recommended 3 to 5 days for reevaluation.  Patient advised not to wear right-sided hearing aid until symptoms resolved. Worrisome signs and symptoms were discussed with the patient, and the patient acknowledged understanding to return to the ED if noticed. Patient was stable upon discharge.          Final Clinical Impression(s) / ED Diagnoses Final diagnoses:  Acute otitis externa of right ear, unspecified type    Rx / DC Orders ED Discharge Orders          Ordered    ciprofloxacin-dexamethasone (CIPRODEX) OTIC suspension  2 times daily        01/12/22 1659              Wilnette Kales, [place] 01/12/22 1708    Leanord Asal K, DO 01/13/22 0002

## 2022-01-12 NOTE — Discharge Instructions (Addendum)
Workup today consistent with outer ear infection. Use drops as prescribed. Follow up with PCP in 3-5 days for reevaluation. Please do not hesitate to return to the ED if the worrisome signs and symptoms we discussed become apparent.

## 2022-01-12 NOTE — ED Triage Notes (Signed)
Patient here POV from Home.  Patient believes an Insect crawled into his Right Ear and since then he has been having a Mild Discomfort to Same.  Possible FB noted. Some Irritation visualized. No Drainage.   NAD Noted during Triage. A&Ox4. Gcs 15. Ambulatory.

## 2022-04-26 ENCOUNTER — Ambulatory Visit: Payer: Medicare Other | Admitting: Cardiology

## 2022-05-03 ENCOUNTER — Ambulatory Visit: Payer: Medicare Other | Admitting: Cardiology

## 2022-05-10 ENCOUNTER — Ambulatory Visit: Payer: Medicare Other

## 2022-05-10 ENCOUNTER — Ambulatory Visit: Payer: Medicare Other | Admitting: Cardiology

## 2022-05-10 ENCOUNTER — Encounter: Payer: Self-pay | Admitting: Cardiology

## 2022-05-10 VITALS — BP 131/59 | HR 68 | Resp 15 | Ht 67.0 in | Wt 178.0 lb

## 2022-05-10 DIAGNOSIS — I10 Essential (primary) hypertension: Secondary | ICD-10-CM

## 2022-05-10 DIAGNOSIS — I25708 Atherosclerosis of coronary artery bypass graft(s), unspecified, with other forms of angina pectoris: Secondary | ICD-10-CM

## 2022-05-10 DIAGNOSIS — Z955 Presence of coronary angioplasty implant and graft: Secondary | ICD-10-CM

## 2022-05-10 NOTE — Progress Notes (Signed)
Patient referred by Shon Baton, MD for CAD  Subjective:   Alejandro Foster, male    DOB: [birth year], 89 y.o.   MRN: [id number]   Chief Complaint  Patient presents with   Coronary Artery Disease    atherosclerotic   New Patient (Initial Visit)    Referred by Dr. Virgina Jock     HPI  89 y.o. Caucasian male with patient hypertension, hyperlipidemia type 2 diabetes mellitus, coronary artery disease s/p PCI with Palmar Schatz stents in 1996, suspected mild PAD, here to establish care.  Patient is quite active for his age, enjoys his annual [place] trips to [place].  Following symptoms described by him were also noted in prior office visits with his previous cardiologist.  Notably, he reports retrosternal burning sensation on walking at high elevation.  He has no symptoms while walking 2 to 3 miles on relatively flat land.  He does report burning sensation in his entire legs, which resolved instantaneously after resting.  While he has noted to have biphasic waveform on his previous vascular ultrasound, iliac vessels were noted to be widely patent.   Reviewed recent test results with the patient, details below.      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 ([place]) 11/10/2019   Coronary artery disease    cardiologist-  dr Wynonia Lawman--  s/p  cardiac cath's w/ angioplasty and stenting x2   DDD (degenerative disc disease), lumbar    Diabetic peripheral neuropathy associated with type 2 diabetes mellitus ([place]) 12/12/2017   Dyslipidemia 01/25/2018   Dyspnea on exertion 08/22/2019   Foraminal stenosis of lumbar region 12/12/2017   History of kidney stones    Hyperlipidemia    Hypertension    Lumbar radiculopathy, right    right leg weakness   Myofascial pain syndrome 12/12/2017   Nephrolithiasis    bilateral non-obstructive per ct 01-19-2016    Peripheral neuropathy    feet   Posterior vitreous detachment of both eyes 11/10/2019   Pseudophakia of both eyes 11/10/2019   Right leg weakness    due to lumbar radiculopathy   Right ureteral stone    S/P coronary artery stent placement    2002 x1  and 2003 x1   Spondylosis without myelopathy or radiculopathy, lumbar region 12/12/2017   Trigger finger 05/28/2018   Type 2 diabetes mellitus ([place])    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   COLONOSCOPY WITH PROPOFOL  2014   CORONARY ANGIOPLASTY  1990  dr Wynonia Lawman   PTCA to OM2   CORONARY ANGIOPLASTY  10/ 1996  dr Wynonia Lawman   PTCA to RCA   CORONARY ANGIOPLASTY WITH STENT PLACEMENT  04/ 1996  dr Wynonia Lawman   stenting to RCA   CYSTOSCOPY WITH URETEROSCOPY AND STENT PLACEMENT Right 03/28/2016   Procedure: CYSTOSCOPY  WITH RIGHT  URETEROSCOPY AND STENT PLACEMENT;  Surgeon: Festus Aloe, MD;  Location: [facility];  Service: Urology;  Laterality: Right;   EXTRACORPOREAL SHOCK WAVE LITHOTRIPSY  yrs ago   HOLMIUM LASER APPLICATION Right [id number]   Procedure: HOLMIUM LASER APPLICATION;  Surgeon: Festus Aloe, MD;  Location: [facility];  Service: Urology;  Laterality: Right;   LAPAROSCOPIC CHOLECYSTECTOMY  1990's   TONSILLECTOMY AND ADENOIDECTOMY  child   URETEROLITHOTOMY  1990's     Social History   Tobacco Use  Smoking Status Former   Packs/day: 1.00   Years: 40.00   Total pack years: 40.00   Types: Cigarettes   Quit date: 08/26/1988    Years since quitting: 33.7  Smokeless Tobacco Never    Social History   Substance and Sexual Activity  Alcohol Use Yes   Comment: rare     Family History  Problem Relation Age of Onset   Gallbladder disease Father    Cancer Brother       Current Outpatient Medications:    acetaminophen (TYLENOL) 325 MG tablet, Take 325 mg by mouth every 6 (six) hours as needed for mild pain or moderate pain. Gel caps, Disp: , Rfl:    amLODipine (NORVASC) 5 MG tablet, Take 5 mg by mouth every evening. , Disp: , Rfl:    aspirin EC 81 MG tablet, Take 81 mg by mouth daily., Disp: , Rfl:    atorvastatin (LIPITOR) 20 MG tablet, Take 20 mg by mouth every evening. , Disp: , Rfl:    cetirizine (ZYRTEC) 10 MG tablet, Take 10 mg by mouth daily as needed for allergies., Disp: , Rfl:    Cholecalciferol (VITAMIN D PO), Take 5 drops by mouth every evening. Unknown strength, Disp: , Rfl:    fenofibrate (TRICOR) 48 MG tablet, Take 48 mg by mouth every evening. , Disp: , Rfl:    gabapentin (NEURONTIN) 100 MG capsule, Take 100-200 mg by mouth 2 (two) times daily. Take '100mg'$  at noon and then '200mg'$  at night, Disp: , Rfl:    glipiZIDE-metformin (METAGLIP) 2.5-500 MG per tablet, Take 2 tablets by mouth 2 (two) times daily before a meal. , Disp: , Rfl:    HUMALOG MIX 75/25 KWIKPEN (75-25) 100 UNIT/ML Kwikpen, Inject into the skin 2 (two) times daily. 35 units in the morning and then 22 units in the evening., Disp: , Rfl: 6   hydrochlorothiazide (HYDRODIURIL) 25 MG tablet, Take 25 mg by mouth every morning. , Disp: , Rfl:    ibuprofen (ADVIL) 200 MG tablet, Take 200 mg by mouth every 6 (six) hours as needed., Disp: , Rfl:    Magnesium Citrate 125 MG CAPS, Take 150 mg by mouth daily., Disp: , Rfl:    metoprolol tartrate (LOPRESSOR) 25 MG tablet, Take 25 mg by mouth every morning. , Disp: , Rfl:    NON FORMULARY, Pure gummie CBD 300 MG, Disp: , Rfl:    Probiotic Product (PRO-BIOTIC BLEND PO), Take 1 capsule by mouth  daily., Disp: , Rfl:    ramipril (ALTACE) 10 MG capsule, Take 10 mg by mouth 2 (two) times daily. , Disp: , Rfl:    solifenacin (VESICARE) 5 MG tablet, Take 5 mg by mouth every morning. , Disp: , Rfl:    zolpidem (AMBIEN) 10 MG tablet, Take 10 mg by mouth at bedtime., Disp: , Rfl:    DULoxetine (CYMBALTA) 30 MG capsule, Take 30 mg by mouth daily., Disp: , Rfl:    Cardiovascular and  other pertinent studies:  Reviewed external labs and tests, independently interpreted  EKG 05/10/2022: Sinus rhythm 59 bpm First degree A-V block    Recent labs: Reviewed. Cholesterol well controlled. Will update findings.     Review of Systems  Cardiovascular:  Positive for chest pain (As per HPI). Negative for dyspnea on exertion, leg swelling, palpitations and syncope.         Vitals:   05/10/22 0942  BP: (!) 131/59  Pulse: 68  Resp: 15  SpO2: 94%     Body mass index is 27.88 kg/m. Filed Weights   05/10/22 0942  Weight: 178 lb (80.7 kg)     Objective:   Physical Exam Vitals and nursing note reviewed.  Constitutional:      General: He is not in acute distress. Neck:     Vascular: No JVD.  Cardiovascular:     Rate and Rhythm: Normal rate and regular rhythm.     Heart sounds: Normal heart sounds. No murmur heard. Pulmonary:     Effort: Pulmonary effort is normal.     Breath sounds: Normal breath sounds. No wheezing or rales.  Musculoskeletal:     Right lower leg: No edema.     Left lower leg: No edema.          Visit diagnoses:   ICD-10-CM   1. S/P coronary artery stent placement  Z95.5     2. Coronary artery disease of bypass graft of native heart with stable angina pectoris ([place])  I25.708 EKG 12-Lead       Orders Placed This Encounter  Procedures   EKG 12-Lead     Medication changes this visit: There are no discontinued medications.  No orders of the defined types were placed in this encounter.    Assessment & Recommendations:    89 y.o. Caucasian  male with patient hypertension, hyperlipidemia type 2 diabetes mellitus, coronary artery disease s/p PCI with Palmar Schatz stents in 1996, suspected mild PAD  CAD: Occasional anginal symptoms, not lifestyle limiting. Will obtain echocardiogram and Lexiscan nuclear stress test.  While he is able to walk for long distances, he has difficulty walking on treadmill and rapid speech. Continue aspirin, statin, hypertension and diabetes management.   Further recommendations after above testing.   Thank you for referring the patient to [place]. Please feel free to contact with any questions.   Nigel Mormon, MD Pager: [phone] Office: [phone]

## 2022-05-12 ENCOUNTER — Encounter: Payer: Self-pay | Admitting: Cardiology

## 2022-05-12 DIAGNOSIS — I25708 Atherosclerosis of coronary artery bypass graft(s), unspecified, with other forms of angina pectoris: Secondary | ICD-10-CM | POA: Insufficient documentation

## 2022-06-01 ENCOUNTER — Other Ambulatory Visit: Payer: Medicare Other

## 2022-06-05 ENCOUNTER — Other Ambulatory Visit: Payer: Medicare Other

## 2022-06-19 ENCOUNTER — Ambulatory Visit: Payer: Medicare Other

## 2022-06-19 DIAGNOSIS — I25708 Atherosclerosis of coronary artery bypass graft(s), unspecified, with other forms of angina pectoris: Secondary | ICD-10-CM

## 2022-07-06 ENCOUNTER — Emergency Department (HOSPITAL_COMMUNITY): Payer: Medicare Other

## 2022-07-06 ENCOUNTER — Other Ambulatory Visit: Payer: Self-pay

## 2022-07-06 ENCOUNTER — Inpatient Hospital Stay (HOSPITAL_COMMUNITY)
Admission: EM | Admit: 2022-07-06 | Discharge: 2022-07-18 | DRG: 481 | Disposition: A | Discharge status: Skilled Nursing Facility | Payer: Medicare Other | Attending: Internal Medicine | Admitting: Internal Medicine

## 2022-07-06 ENCOUNTER — Encounter (HOSPITAL_COMMUNITY): Payer: Self-pay | Admitting: Pharmacy Technician

## 2022-07-06 DIAGNOSIS — R197 Diarrhea, unspecified: Secondary | ICD-10-CM | POA: Diagnosis present

## 2022-07-06 DIAGNOSIS — K222 Esophageal obstruction: Secondary | ICD-10-CM | POA: Diagnosis not present

## 2022-07-06 DIAGNOSIS — I10 Essential (primary) hypertension: Secondary | ICD-10-CM | POA: Diagnosis present

## 2022-07-06 DIAGNOSIS — E113293 Type 2 diabetes mellitus with mild nonproliferative diabetic retinopathy without macular edema, bilateral: Secondary | ICD-10-CM | POA: Diagnosis present

## 2022-07-06 DIAGNOSIS — Z7982 Long term (current) use of aspirin: Secondary | ICD-10-CM | POA: Diagnosis not present

## 2022-07-06 DIAGNOSIS — W010XXA Fall on same level from slipping, tripping and stumbling without subsequent striking against object, initial encounter: Secondary | ICD-10-CM | POA: Diagnosis present

## 2022-07-06 DIAGNOSIS — G4700 Insomnia, unspecified: Secondary | ICD-10-CM | POA: Diagnosis present

## 2022-07-06 DIAGNOSIS — Z79899 Other long term (current) drug therapy: Secondary | ICD-10-CM | POA: Diagnosis not present

## 2022-07-06 DIAGNOSIS — Z955 Presence of coronary angioplasty implant and graft: Secondary | ICD-10-CM

## 2022-07-06 DIAGNOSIS — Z87891 Personal history of nicotine dependence: Secondary | ICD-10-CM

## 2022-07-06 DIAGNOSIS — E876 Hypokalemia: Secondary | ICD-10-CM | POA: Diagnosis present

## 2022-07-06 DIAGNOSIS — K3189 Other diseases of stomach and duodenum: Secondary | ICD-10-CM | POA: Diagnosis present

## 2022-07-06 DIAGNOSIS — E1149 Type 2 diabetes mellitus with other diabetic neurological complication: Secondary | ICD-10-CM | POA: Diagnosis not present

## 2022-07-06 DIAGNOSIS — Z794 Long term (current) use of insulin: Secondary | ICD-10-CM

## 2022-07-06 DIAGNOSIS — S72142A Displaced intertrochanteric fracture of left femur, initial encounter for closed fracture: Principal | ICD-10-CM | POA: Diagnosis present

## 2022-07-06 DIAGNOSIS — E119 Type 2 diabetes mellitus without complications: Secondary | ICD-10-CM

## 2022-07-06 DIAGNOSIS — K2210 Ulcer of esophagus without bleeding: Secondary | ICD-10-CM | POA: Diagnosis present

## 2022-07-06 DIAGNOSIS — M549 Dorsalgia, unspecified: Secondary | ICD-10-CM | POA: Diagnosis present

## 2022-07-06 DIAGNOSIS — K219 Gastro-esophageal reflux disease without esophagitis: Secondary | ICD-10-CM | POA: Diagnosis present

## 2022-07-06 DIAGNOSIS — Z882 Allergy status to sulfonamides status: Secondary | ICD-10-CM | POA: Diagnosis not present

## 2022-07-06 DIAGNOSIS — G8929 Other chronic pain: Secondary | ICD-10-CM | POA: Diagnosis present

## 2022-07-06 DIAGNOSIS — I2510 Atherosclerotic heart disease of native coronary artery without angina pectoris: Secondary | ICD-10-CM | POA: Diagnosis present

## 2022-07-06 DIAGNOSIS — D62 Acute posthemorrhagic anemia: Secondary | ICD-10-CM | POA: Diagnosis present

## 2022-07-06 DIAGNOSIS — N3281 Overactive bladder: Secondary | ICD-10-CM | POA: Diagnosis present

## 2022-07-06 DIAGNOSIS — K449 Diaphragmatic hernia without obstruction or gangrene: Secondary | ICD-10-CM | POA: Diagnosis present

## 2022-07-06 DIAGNOSIS — E785 Hyperlipidemia, unspecified: Secondary | ICD-10-CM | POA: Diagnosis present

## 2022-07-06 DIAGNOSIS — E1142 Type 2 diabetes mellitus with diabetic polyneuropathy: Secondary | ICD-10-CM | POA: Diagnosis present

## 2022-07-06 DIAGNOSIS — S72009A Fracture of unspecified part of neck of unspecified femur, initial encounter for closed fracture: Secondary | ICD-10-CM | POA: Diagnosis present

## 2022-07-06 DIAGNOSIS — L740 Miliaria rubra: Secondary | ICD-10-CM | POA: Diagnosis present

## 2022-07-06 DIAGNOSIS — W19XXXA Unspecified fall, initial encounter: Secondary | ICD-10-CM | POA: Diagnosis present

## 2022-07-06 DIAGNOSIS — Z7984 Long term (current) use of oral hypoglycemic drugs: Secondary | ICD-10-CM | POA: Diagnosis not present

## 2022-07-06 DIAGNOSIS — Y92009 Unspecified place in unspecified non-institutional (private) residence as the place of occurrence of the external cause: Secondary | ICD-10-CM

## 2022-07-06 HISTORY — DX: Other complications of anesthesia, initial encounter: T88.59XA

## 2022-07-06 LAB — CBC WITH DIFFERENTIAL/PLATELET
Abs Immature Granulocytes: 0.03 10*3/uL (ref 0.00–0.07)
Basophils Absolute: 0.1 10*3/uL (ref 0.0–0.1)
Basophils Relative: 1 %
Eosinophils Absolute: 0.2 10*3/uL (ref 0.0–0.5)
Eosinophils Relative: 3 %
HCT: 39.5 % (ref 39.0–52.0)
Hemoglobin: 12.7 g/dL — ABNORMAL LOW (ref 13.0–17.0)
Immature Granulocytes: 0 %
Lymphocytes Relative: 26 %
Lymphs Abs: 1.9 10*3/uL (ref 0.7–4.0)
MCH: 28.5 pg (ref 26.0–34.0)
MCHC: 32.2 g/dL (ref 30.0–36.0)
MCV: 88.8 fL (ref 80.0–100.0)
Monocytes Absolute: 0.8 10*3/uL (ref 0.1–1.0)
Monocytes Relative: 11 %
Neutro Abs: 4.1 10*3/uL (ref 1.7–7.7)
Neutrophils Relative %: 59 %
Platelets: 237 10*3/uL (ref 150–400)
RBC: 4.45 MIL/uL (ref 4.22–5.81)
RDW: 14.6 % (ref 11.5–15.5)
WBC: 7.1 10*3/uL (ref 4.0–10.5)
nRBC: 0 % (ref 0.0–0.2)

## 2022-07-06 LAB — BASIC METABOLIC PANEL
Anion gap: 10 (ref 5–15)
BUN: 19 mg/dL (ref 8–23)
CO2: 22 mmol/L (ref 22–32)
Calcium: 9 mg/dL (ref 8.9–10.3)
Chloride: 105 mmol/L (ref 98–111)
Creatinine, Ser: 0.93 mg/dL (ref 0.61–1.24)
GFR, Estimated: >60 mL/min (ref >60)
Glucose, Bld: 208 mg/dL — ABNORMAL HIGH (ref 70–99)
Potassium: 4.1 mmol/L (ref 3.5–5.1)
Sodium: 137 mmol/L (ref 135–145)

## 2022-07-06 LAB — GLUCOSE, CAPILLARY
Glucose-Capillary: 120 mg/dL — ABNORMAL HIGH (ref 70–99)
Glucose-Capillary: 108 mg/dL — ABNORMAL HIGH (ref 70–99)

## 2022-07-06 MED ORDER — ACETAMINOPHEN 500 MG PO TABS
1000.0000 mg | ORAL_TABLET | Freq: Three times a day (TID) | ORAL | Status: AC
  Administered 2022-07-06 – 2022-07-09 (×6): 1000 mg via ORAL
  Filled 2022-07-06 (×8): qty 2

## 2022-07-06 MED ORDER — TRANEXAMIC ACID-NACL 1000-0.7 MG/100ML-% IV SOLN
1000.0000 mg | INTRAVENOUS | Status: AC
  Administered 2022-07-07: 1000 mg via INTRAVENOUS
  Filled 2022-07-06 (×2): qty 100

## 2022-07-06 MED ORDER — POVIDONE-IODINE 10 % EX SWAB
2.0000 | Freq: Once | CUTANEOUS | Status: AC
  Administered 2022-07-07: 2 via TOPICAL

## 2022-07-06 MED ORDER — TRANEXAMIC ACID 1000 MG/10ML IV SOLN
2000.0000 mg | INTRAVENOUS | Status: DC
  Filled 2022-07-06: qty 20

## 2022-07-06 MED ORDER — CEFAZOLIN SODIUM-DEXTROSE 2-4 GM/100ML-% IV SOLN
2.0000 g | INTRAVENOUS | Status: AC
  Administered 2022-07-07: 2 g via INTRAVENOUS
  Filled 2022-07-06: qty 100

## 2022-07-06 MED ORDER — POLYETHYLENE GLYCOL 3350 17 G PO PACK
17.0000 g | PACK | Freq: Every day | ORAL | Status: DC
  Administered 2022-07-08 – 2022-07-13 (×5): 17 g via ORAL
  Filled 2022-07-06 (×8): qty 1

## 2022-07-06 MED ORDER — INSULIN ASPART 100 UNIT/ML IJ SOLN
0.0000 [IU] | Freq: Three times a day (TID) | INTRAMUSCULAR | Status: DC
  Administered 2022-07-07 (×2): 3 [IU] via SUBCUTANEOUS
  Administered 2022-07-08: 2 [IU] via SUBCUTANEOUS
  Administered 2022-07-08 – 2022-07-09 (×2): 3 [IU] via SUBCUTANEOUS
  Administered 2022-07-09: 2 [IU] via SUBCUTANEOUS
  Administered 2022-07-09: 8 [IU] via SUBCUTANEOUS
  Administered 2022-07-10: 3 [IU] via SUBCUTANEOUS
  Administered 2022-07-10: 5 [IU] via SUBCUTANEOUS
  Administered 2022-07-11 (×2): 3 [IU] via SUBCUTANEOUS
  Administered 2022-07-11: 2 [IU] via SUBCUTANEOUS
  Administered 2022-07-12 – 2022-07-13 (×4): 3 [IU] via SUBCUTANEOUS
  Administered 2022-07-14 (×2): 5 [IU] via SUBCUTANEOUS
  Administered 2022-07-15: 8 [IU] via SUBCUTANEOUS
  Administered 2022-07-15: 3 [IU] via SUBCUTANEOUS
  Administered 2022-07-16: 8 [IU] via SUBCUTANEOUS
  Administered 2022-07-16: 3 [IU] via SUBCUTANEOUS
  Administered 2022-07-16: 2 [IU] via SUBCUTANEOUS
  Administered 2022-07-17 (×2): 3 [IU] via SUBCUTANEOUS
  Administered 2022-07-17: 5 [IU] via SUBCUTANEOUS
  Administered 2022-07-18: 11 [IU] via SUBCUTANEOUS
  Administered 2022-07-18: 5 [IU] via SUBCUTANEOUS
  Filled 2022-07-06: qty 0.15

## 2022-07-06 MED ORDER — OXYCODONE HCL 5 MG PO TABS
5.0000 mg | ORAL_TABLET | ORAL | Status: DC | PRN
  Administered 2022-07-06 – 2022-07-07 (×2): 5 mg via ORAL
  Filled 2022-07-06 (×2): qty 1

## 2022-07-06 MED ORDER — ACETAMINOPHEN 325 MG PO TABS: 650.0000 mg | ORAL_TABLET | Freq: Four times a day (QID) | ORAL | Status: DC | PRN

## 2022-07-06 MED ORDER — FENTANYL CITRATE PF 50 MCG/ML IJ SOSY
75.0000 ug | PREFILLED_SYRINGE | Freq: Once | INTRAMUSCULAR | Status: AC | PRN
  Administered 2022-07-06: 75 ug via INTRAVENOUS
  Filled 2022-07-06: qty 2

## 2022-07-06 MED ORDER — INSULIN DETEMIR 100 UNIT/ML ~~LOC~~ SOLN
15.0000 [IU] | Freq: Two times a day (BID) | SUBCUTANEOUS | Status: DC
  Administered 2022-07-07 – 2022-07-09 (×6): 15 [IU] via SUBCUTANEOUS
  Filled 2022-07-06 (×9): qty 0.15

## 2022-07-06 MED ORDER — ZOLPIDEM TARTRATE 5 MG PO TABS
5.0000 mg | ORAL_TABLET | Freq: Every evening | ORAL | Status: DC | PRN
  Administered 2022-07-06: 5 mg via ORAL
  Filled 2022-07-06: qty 1

## 2022-07-06 MED ORDER — INSULIN ASPART 100 UNIT/ML IJ SOLN
0.0000 [IU] | Freq: Every day | INTRAMUSCULAR | Status: DC
  Administered 2022-07-07 – 2022-07-11 (×2): 2 [IU] via SUBCUTANEOUS
  Administered 2022-07-15: 3 [IU] via SUBCUTANEOUS
  Administered 2022-07-16: 2 [IU] via SUBCUTANEOUS
  Filled 2022-07-06: qty 0.05

## 2022-07-06 MED ORDER — INSULIN ASPART 100 UNIT/ML IJ SOLN
4.0000 [IU] | Freq: Three times a day (TID) | INTRAMUSCULAR | Status: DC
  Administered 2022-07-07 – 2022-07-08 (×4): 4 [IU] via SUBCUTANEOUS
  Filled 2022-07-06: qty 0.04

## 2022-07-06 MED ORDER — MORPHINE SULFATE (PF) 2 MG/ML IV SOLN
2.0000 mg | INTRAVENOUS | Status: DC | PRN
  Administered 2022-07-06: 2 mg via INTRAVENOUS
  Filled 2022-07-06: qty 1

## 2022-07-06 MED ORDER — PANTOPRAZOLE SODIUM 40 MG PO TBEC
40.0000 mg | DELAYED_RELEASE_TABLET | Freq: Every day | ORAL | Status: DC
  Administered 2022-07-06 – 2022-07-10 (×5): 40 mg via ORAL
  Filled 2022-07-06 (×7): qty 1

## 2022-07-06 NOTE — Progress Notes (Signed)
Hearing aids in both ears.

## 2022-07-06 NOTE — Progress Notes (Signed)
Received consult from EDP for left hip fx.  I've reviewed the films and will plan to perform surgical repair tomorrow afternoon pending medical optimization.  Patient may have a diet today.  NPO after midnight.  Please hold lovenox for surgery.  Please transfer patient to [facility] for surgery.  Full consult to follow in the morning.  Mayra Reel, MD [facility] 2:10 PM

## 2022-07-06 NOTE — Progress Notes (Signed)
Orthopedic Tech Progress Note Patient Details:  Alejandro Foster 12/24/1933 [id number]  Musculoskeletal Traction Type of Traction: [place] Skin Traction Traction Location: lle Traction Weight: 10 lbs   Post Interventions Patient Tolerated: Well Instructions Provided: Care of device, Adjustment of device  [name] 07/06/2022, 9:12 PM

## 2022-07-06 NOTE — H&P (Signed)
History and Physical    PAUL TRETTIN ZOX:[id number] DOB: 1933-12-13 DOA: 07/06/2022  PCP: Creola Corn, MD  Patient coming from: home  I have personally briefly reviewed patient's old medical records in [facility]  Chief Complaint: fall, hip pain  HPI: Alejandro Foster is Alejandro Foster 89 y.o. male with medical history significant of CAD, T2DM, HTN, HLD presenting after Tonika Eden mechanical fall and left hip pain.  He was working around his car and tripped and fell hitting his L hip and his head.  Denies LOC.  Had immediate pain and was not ambulatory.  He came from home via EMS.  Denies CP, SOB, fevers, chills, abdominal pain.  Denies smoking, drinks rarely.  ED Course: imaging consistent with comminuted displaced and angulated intertrochanteric fracture of the left hip.  Admit to hospitalist, Dr. Roda Shutters to manage at [facility].   Review of Systems: As per HPI otherwise all other systems reviewed and are negative.  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 11/10/2019   Coronary artery disease    cardiologist-  dr Donnie Aho--  s/p  cardiac cath's w/ angioplasty and stenting x2   DDD (degenerative disc disease), lumbar    Diabetic peripheral neuropathy associated with type 2 diabetes mellitus 12/12/2017   Dyslipidemia 01/25/2018   Dyspnea on exertion 08/22/2019   Foraminal stenosis of lumbar region 12/12/2017   History of kidney stones    Hyperlipidemia    Hypertension    Lumbar radiculopathy, right    right leg weakness   Myofascial pain syndrome 12/12/2017   Nephrolithiasis    bilateral non-obstructive per ct 01-19-2016   Peripheral neuropathy    feet   Posterior vitreous detachment of both eyes 11/10/2019   Pseudophakia of both eyes 11/10/2019   Right leg weakness    due to lumbar radiculopathy   Right ureteral stone    S/P coronary artery stent placement    2002 x1  and 2003  x1   Spondylosis without myelopathy or radiculopathy, lumbar region 12/12/2017   Trigger finger 05/28/2018   Type 2 diabetes mellitus    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   COLONOSCOPY WITH PROPOFOL  2014   CORONARY ANGIOPLASTY  1990  dr Donnie Aho   PTCA to OM2   CORONARY ANGIOPLASTY  10/ 1996  dr Donnie Aho   PTCA to RCA   CORONARY ANGIOPLASTY WITH STENT PLACEMENT  04/ 1996  dr Donnie Aho   stenting to RCA   CYSTOSCOPY WITH URETEROSCOPY AND STENT PLACEMENT Right 03/28/2016   Procedure: CYSTOSCOPY WITH RIGHT  URETEROSCOPY AND STENT PLACEMENT;  Surgeon: Jerilee Field, MD;  Location: [facility];  Service: Urology;  Laterality: Right;   EXTRACORPOREAL SHOCK WAVE LITHOTRIPSY  yrs ago   HOLMIUM LASER APPLICATION Right 03/28/2016   Procedure: HOLMIUM LASER APPLICATION;  Surgeon: Jerilee Field, MD;  Location: [facility];  Service: Urology;  Laterality: Right;   LAPAROSCOPIC CHOLECYSTECTOMY  1990's   TONSILLECTOMY AND ADENOIDECTOMY  child   URETEROLITHOTOMY  1990's    Social History  reports that he quit smoking about 33 years ago. His smoking use included cigarettes. He has Hang Ammon 40.00 pack-year smoking history. He has never used smokeless tobacco. He reports current alcohol use. He reports that he does not use drugs.  Allergies  Allergen Reactions   Sulfa Antibiotics Other (See Comments)    Unknown reaction- childhood allergy    Family History  Problem Relation Age of Onset   Gallbladder disease  Father    Cancer Brother     Prior to Admission medications   Medication Sig Start Date End Date Taking? Authorizing Provider  fluorouracil (EFUDEX) 5 % cream APPLY Conor Filsaime SMALL AMOUNT TO AFFECTED AREA AT BEDTIME 05/18/22  Yes [provider]  acetaminophen (TYLENOL) 325 MG tablet Take 325 mg by mouth every 6 (six) hours as needed for mild pain or moderate pain. Gel caps    [provider]  amLODipine (NORVASC) 5 MG tablet Take 5 mg by mouth every evening.     [provider]  aspirin EC 81 MG tablet Take 81 mg by mouth daily.    [provider]  atorvastatin (LIPITOR) 20 MG tablet Take 20 mg by mouth every evening.     [provider]  cetirizine (ZYRTEC) 10 MG tablet Take 10 mg by mouth daily as needed for allergies.    [provider]  Cholecalciferol (VITAMIN D PO) Take 5 drops by mouth every evening. Unknown strength    [provider]  DULoxetine (CYMBALTA) 30 MG capsule Take 30 mg by mouth daily.    [provider]  fenofibrate (TRICOR) 48 MG tablet Take 48 mg by mouth every evening.     [provider]  gabapentin (NEURONTIN) 100 MG capsule Take 100-200 mg by mouth 2 (two) times daily. Take 100mg  at noon and then 200mg  at night    [provider]  glipiZIDE-metformin (METAGLIP) 2.5-500 MG per tablet Take 2 tablets by mouth 2 (two) times daily before Gwendolyne Welford meal.     [provider]  HUMALOG MIX 75/25 KWIKPEN (75-25) 100 UNIT/ML Kwikpen Inject into the skin 2 (two) times daily. 35 units in the morning and then 22 units in the evening. 12/31/15   [provider]  hydrochlorothiazide (HYDRODIURIL) 25 MG tablet Take 25 mg by mouth every morning.     [provider]  ibuprofen (ADVIL) 200 MG tablet Take 200 mg by mouth every 6 (six) hours as needed.    [provider]  Magnesium Citrate 125 MG CAPS Take 150 mg by mouth daily.    [provider]  metoprolol tartrate  (LOPRESSOR) 25 MG tablet Take 25 mg by mouth every morning.     [provider]  NON FORMULARY Pure gummie CBD 300 MG    [provider]  Probiotic Product (PRO-BIOTIC BLEND PO) Take 1 capsule by mouth daily.    [provider]  ramipril (ALTACE) 10 MG capsule Take 10 mg by mouth 2 (two) times daily.  11/29/16   [provider]  solifenacin (VESICARE) 5 MG tablet Take 5 mg by mouth every morning.     [provider]  zolpidem (AMBIEN) 10 MG tablet Take 10 mg by mouth at bedtime.    [provider]    Physical Exam: Vitals:  07/06/22 1205  BP: (!) 155/77  Resp: 16  Temp: 97.6 F (36.4 C)  TempSrc: Oral  SpO2: 97%    Constitutional: NAD, calm, comfortable Vitals:   07/06/22 1205  BP: (!) 155/77  Resp: 16  Temp: 97.6 F (36.4 C)  TempSrc: Oral  SpO2: 97%   Eyes: PERRL, lids and conjunctivae normal ENMT: Mucous membranes are moist Neck: normal, supple Respiratory: clear to auscultation bilaterally, no wheezing, no crackles. Normal respiratory effort.  Cardiovascular: Regular rate and rhythm, no murmurs / rubs / gallops. No extremity edema. Palpable pulses Abdomen: no tenderness, no masses palpated.  Musculoskeletal: LLE shortened and externally rotated  Skin: no rashes, lesions, ulcers.  Neurologic: CN 2-12 grossly intact. Moving all extremities.  Psychiatric: Normal judgment and insight. Alert and oriented x 3. Normal mood.   Labs on Admission: I have personally reviewed following labs and imaging studies  CBC: Recent Labs  Lab 07/06/22 1215  WBC 7.1  NEUTROABS 4.1  HGB 12.7*  HCT 39.5  MCV 88.8  PLT 237    Basic Metabolic Panel: Recent Labs  Lab 07/06/22 1215  NA 137  K 4.1  CL 105  CO2 22  GLUCOSE 208*  BUN 19  CREATININE 0.93  CALCIUM 9.0    GFR: CrCl cannot be calculated (Unknown ideal weight.).  Liver Function Tests: No results for input(s): "AST", "ALT", "ALKPHOS", "BILITOT", "PROT",  "ALBUMIN" in the last 168 hours.  Urine analysis:    Component Value Date/Time   COLORURINE YELLOW 01/19/2016 1600   APPEARANCEUR CLEAR 01/19/2016 1600   LABSPEC 1.017 01/19/2016 1600   PHURINE 7.0 01/19/2016 1600   GLUCOSEU NEGATIVE 01/19/2016 1600   HGBUR TRACE (Karle Desrosier) 01/19/2016 1600   BILIRUBINUR NEGATIVE 01/19/2016 1600   KETONESUR NEGATIVE 01/19/2016 1600   PROTEINUR NEGATIVE 01/19/2016 1600   NITRITE NEGATIVE 01/19/2016 1600   LEUKOCYTESUR SMALL (Kandance Yano) 01/19/2016 1600    Radiological Exams on Admission: CT Head Wo Contrast  Result Date: 07/06/2022 CLINICAL DATA:  Head trauma, minor (Age >= 65y) EXAM: CT HEAD WITHOUT CONTRAST TECHNIQUE: Contiguous axial images were obtained from the base of the skull through the vertex without intravenous contrast. RADIATION DOSE REDUCTION: This exam was performed according to the departmental dose-optimization program which includes automated exposure control, adjustment of the mA and/or kV according to patient size and/or use of iterative reconstruction technique. COMPARISON:  None Available. FINDINGS: Brain: No evidence of acute infarction, hemorrhage, hydrocephalus, extra-axial collection or mass lesion/mass effect. Cerebral atrophy. Vascular: No hyperdense vessel. Skull: No acute fracture. Sinuses/Orbits: Mild paranasal sinus mucosal thickening. No acute orbital findings. Other: No mastoid effusions. IMPRESSION: No evidence of acute intracranial abnormality. Electronically Signed   By: Feliberto Harts M.D.   On: 07/06/2022 14:26   CT Cervical Spine Wo Contrast  Result Date: 07/06/2022 CLINICAL DATA:  Trauma EXAM: CT CERVICAL SPINE WITHOUT CONTRAST TECHNIQUE: Multidetector CT imaging of the cervical spine was performed without intravenous contrast. Multiplanar CT image reconstructions were also generated. RADIATION DOSE REDUCTION: This exam was performed according to the departmental dose-optimization program which includes automated exposure control,  adjustment of the mA and/or kV according to patient size and/or use of iterative reconstruction technique. COMPARISON:  None Available. FINDINGS: Alignment: Normal Skull base and vertebrae: No compression deformities. Osseous structures are osteopenic. No osteolytic or osteoblastic lesions. Soft tissues and spinal canal: No prevertebral fluid or swelling. No visible canal hematoma. Disc levels: Disc space narrowing with marginal osteophyte formation identified at C5-6 and C6-7. Facet joint osteoarthritic changes identified on  the left C2-3, on the right C3-4, bilateral C4-5, bilateral C5-6, and bilaterally C7-T1. Upper chest: Negative. Other: None. IMPRESSION: Degenerative changes. Osteopenia. No acute traumatic abnormalities. Electronically Signed   By: Layla Maw M.D.   On: 07/06/2022 14:19   DG Hip Unilat W or Wo Pelvis 2-3 Views Left  Result Date: 07/06/2022 CLINICAL DATA:  Pain after fall EXAM: DG HIP (WITH OR WITHOUT PELVIS) 3V LEFT COMPARISON:  None Available. FINDINGS: Comminuted displaced and angulated intertrochanteric left hip fracture. No additional fracture or dislocation. Mild joint space loss of the hips. Mild degenerative changes of the left sacroiliac joint. Vascular calcifications. Prominent degenerative changes of the lumbar spine at the edge of the imaging field. IMPRESSION: Comminuted displaced and angulated intertrochanteric fracture of the left hip Electronically Signed   By: Karen Kays M.D.   On: 07/06/2022 13:27    EKG: Independently reviewed. pending  Assessment/Plan Principal Problem:   Fall Active Problems:   Hip fracture    Assessment and Plan:  Comminuted Displaced and Angulated Intertrochanteric Fracture of the Left Hip  After mechanical fall Ortho c/s, appreciate recs - plan for surgery at [facility] tmrw RCRI at least 3, but able to walk up 2 flights of stairs without issue - likely greater than 4 mets.  Recent echo 04/2022 with EF 40-45%, grade 1 diastolic  dysfunction.  Recent low risk stress test with Dr. Rosemary Holms, no significant abnormalities.  No additional w/u indicated prior to surgery. Scheduled APAP.  Prn oxy/morphine.  Bowel regimen.    With age, certainly at risk for delirium post op Post op dvt ppx and therapy per ortho  Hypertension Metoprolol, amlodipine, HCZT Hold ramipril with surgery tmrw  CAD Recent low risk stress test with Dr. Rosemary Holms Resume aspirin after surgery Continue statin  HLD Continue statin, fibrate  T2DM At home on 75/25 humalog 35 units in AM and 22 units in evening Hold oral meds glipizide/metformin Will use basal bolus regimen here, reduced dose for now, follow and adjust as needed - SSI  Gabapentin for peripheral neuropathy  He's not immediately clear whether cymbalta is for mood or chronic pain - continue for now Vesicare for overactive Bladder Ambien nightly for insomnia    DVT prophylaxis: SCDs, awaiting surgery  Code Status:   full  Family Communication:  Wife at bedside  Disposition Plan:   Patient is from:  home  Anticipated DC to:  Pending surgery  Anticipated DC date:  Pending surgery  Anticipated DC barriers: pending  Consults called:  ortho  Admission status:  inpatient   Severity of Illness: The appropriate patient status for this patient is INPATIENT. Inpatient status is judged to be reasonable and necessary in order to provide the required intensity of service to ensure the patient's safety. The patient's presenting symptoms, physical exam findings, and initial radiographic and laboratory data in the context of their chronic comorbidities is felt to place them at high risk for further clinical deterioration. Furthermore, it is not anticipated that the patient will be medically stable for discharge from the hospital within 2 midnights of admission.   * I certify that at the point of admission it is my clinical judgment that the patient will require inpatient hospital care  spanning beyond 2 midnights from the point of admission due to high intensity of service, high risk for further deterioration and high frequency of surveillance required.Lacretia Nicks MD [facility]  How to contact the [facility] Attending or Consulting provider 7A - 7P  or covering provider during after hours 7P -7A, for this patient?   Check the care team in [facility] and look for Analena Gama) attending/consulting TR[facility] provider listed and b) the [facility] team listed Log into [email or web address] and use [place]'s universal password to access. If you do not have the password, please contact the hospital operator. Locate the [facility] provider you are looking for under [facility] and page to Millee Denise number that you can be directly reached. If you still have difficulty reaching the provider, please page the [facility] (Director on Call) for the Hospitalists listed on amion for assistance.  07/06/2022, 4:08 PM

## 2022-07-06 NOTE — ED Provider Notes (Signed)
[place] EMERGENCY DEPARTMENT AT [facility] Provider Note   CSN: [id number] Arrival date & time: 07/06/22  1154     History  Chief Complaint  Patient presents with   Rj Pedrosa is a 89 y.o. male.  Patient presents the emergency department complaining of left-sided hip pain secondary to a fall.  Patient states he was walking and tripped over a trailer hitch, falling directly on his left hip and subsequently hitting the back of his head.  He denies losing consciousness and denies blood thinner usage.  Patient was not ambulatory and had to be carried via EMS.  Past medical history significant for type II DM, hypertension, CAD   HPI     Home Medications Prior to Admission medications   Medication Sig Start Date End Date Taking? Authorizing Provider  acetaminophen (TYLENOL) 325 MG tablet Take 325 mg by mouth every 6 (six) hours as needed for mild pain or moderate pain. Gel caps    [provider]  amLODipine (NORVASC) 5 MG tablet Take 5 mg by mouth every evening.     [provider]  aspirin EC 81 MG tablet Take 81 mg by mouth daily.    [provider]  atorvastatin (LIPITOR) 20 MG tablet Take 20 mg by mouth every evening.     [provider]  cetirizine (ZYRTEC) 10 MG tablet Take 10 mg by mouth daily as needed for allergies.    [provider]  Cholecalciferol (VITAMIN D PO) Take 5 drops by mouth every evening. Unknown strength    [provider]  DULoxetine (CYMBALTA) 30 MG capsule Take 30 mg by mouth daily.    [provider]  fenofibrate (TRICOR) 48 MG tablet Take 48 mg by mouth every evening.     [provider]  gabapentin (NEURONTIN) 100 MG capsule Take 100-200 mg by mouth 2 (two) times daily. Take  at noon and then  at night    [provider]  glipiZIDE-metformin (METAGLIP) 2.5-500 MG per tablet Take 2 tablets by mouth 2 (two) times daily before a meal.      [provider]  HUMALOG MIX 75/25 KWIKPEN (75-25) 100 UNIT/ML Kwikpen Inject into the skin 2 (two) times daily. 35 units in the morning and then 22 units in the evening. 12/31/15   [provider]  hydrochlorothiazide (HYDRODIURIL) 25 MG tablet Take 25 mg by mouth every morning.     [provider]  ibuprofen (ADVIL) 200 MG tablet Take 200 mg by mouth every 6 (six) hours as needed.    [provider]  Magnesium Citrate 125 MG CAPS Take 150 mg by mouth daily.    [provider]  metoprolol tartrate (LOPRESSOR) 25 MG tablet Take 25 mg by mouth every morning.     [provider]  NON FORMULARY Pure gummie CBD 300 MG    [provider]  Probiotic Product (PRO-BIOTIC BLEND PO) Take 1 capsule by mouth daily.    [provider]  ramipril (ALTACE) 10 MG capsule Take 10 mg by mouth 2 (two) times daily.  11/29/16   [provider]  solifenacin (VESICARE) 5 MG tablet Take 5 mg by mouth every morning.     [provider]  zolpidem (AMBIEN) 10 MG tablet Take 10 mg by mouth at bedtime.    [provider]      Allergies    Sulfa antibiotics    Review of Systems  Review of Systems  Physical Exam Updated Vital Signs BP (!) 155/77 (BP Location: Left Arm)   Temp 97.6 F (36.4 C) (Oral)   Resp 16   SpO2 97%  Physical Exam Vitals and nursing note reviewed.  Constitutional:      General: He is not in acute distress.    Appearance: He is well-developed.  HENT:     Head: Normocephalic and atraumatic.  Eyes:     Conjunctiva/sclera: Conjunctivae normal.  Cardiovascular:     Rate and Rhythm: Normal rate and regular rhythm.     Heart sounds: No murmur heard. Pulmonary:     Effort: Pulmonary effort is normal. No respiratory distress.     Breath sounds: Normal breath sounds.  Abdominal:     Palpations: Abdomen is soft.     Tenderness: There is no abdominal tenderness.  Musculoskeletal:        General:  Tenderness, deformity and signs of injury present. No swelling.     Cervical back: Neck supple.     Comments: Patient external rotation and shortening of the left lower extremity.  Neurovascularly intact distally.  Skin:    General: Skin is warm and dry.     Capillary Refill: Capillary refill takes less than 2 seconds.  Neurological:     Mental Status: He is alert.  Psychiatric:        Mood and Affect: Mood normal.     ED Results / Procedures / Treatments   Labs (all labs ordered are listed, but only abnormal results are displayed) Labs Reviewed  BASIC METABOLIC PANEL - Abnormal; Notable for the following components:      Result Value   Glucose, Bld 208 (*)    All other components within normal limits  CBC WITH DIFFERENTIAL/PLATELET - Abnormal; Notable for the following components:   Hemoglobin 12.7 (*)    All other components within normal limits    EKG None  Radiology DG Hip Unilat W or Wo Pelvis 2-3 Views Left  Result Date: 07/06/2022 CLINICAL DATA:  Pain after fall EXAM: DG HIP (WITH OR WITHOUT PELVIS) 3V LEFT COMPARISON:  None Available. FINDINGS: Comminuted displaced and angulated intertrochanteric left hip fracture. No additional fracture or dislocation. Mild joint space loss of the hips. Mild degenerative changes of the left sacroiliac joint. Vascular calcifications. Prominent degenerative changes of the lumbar spine at the edge of the imaging field. IMPRESSION: Comminuted displaced and angulated intertrochanteric fracture of the left hip Electronically Signed   By: Karen Kays M.D.   On: 07/06/2022 13:27    Procedures Procedures    Medications Ordered in ED Medications  fentaNYL (SUBLIMAZE) injection 75 mcg (75 mcg Intravenous Given 07/06/22 1223)    ED Course/ Medical Decision Making/ A&P                             Medical Decision Making Amount and/or Complexity of Data Reviewed Labs: ordered. Radiology: ordered.  Risk Prescription drug  management.   This patient presents to the ED for concern of left lower extremity pain, this involves an extensive number of treatment options, and is a complaint that carries with it a high risk of complications and morbidity.  The differential diagnosis includes fracture, dislocation, soft tissue injury, others.  Patient also has had and there are concerns for intracranial abnormality, fracture, dislocation   Co morbidities that complicate the patient evaluation  Type II DM   Additional history obtained:  Additional history  obtained from EMS    Lab Tests:  I Ordered, and personally interpreted labs.  The pertinent results include: Grossly unremarkable CBC, BMP   Imaging Studies ordered:  I ordered imaging studies including plain films of the left hip, CT head, CT cervical spine I independently visualized and interpreted imaging which showed comminuted, angulated, displaced intertrochanteric fracture of the left hip.  CT is shows I agree with the radiologist interpretation   Consultations Obtained:  I requested consultation with the orthopedic surgeon,Dr.Xu, and discussed lab and imaging findings as well as pertinent plan - they recommend: admission to [facility] for likely surgical fixation tomorrow Requested consultation with the hospitalist.  Dr. Lowell Guitar agreed to see the patient for admission with plans to admit to [facility]   Problem List / ED Course / Critical interventions / Medication management   I ordered medication including fentanyl for pain Reevaluation of the patient after these medicines showed that the patient improved I have reviewed the patients home medicines and have made adjustments as needed    Test / Admission - Considered:  Patient with left-sided intertrochanteric fracture.  No acute findings on head CT or cervical spine CT.  Plan for admission to [facility] for surgical fixation tomorrow.        Final Clinical Impression(s) / ED  Diagnoses Final diagnoses:  None    Rx / DC Orders ED Discharge Orders     None         Pamala Duffel 07/06/22 1451    Gwyneth Sprout, MD 07/12/22 0008

## 2022-07-06 NOTE — ED Triage Notes (Signed)
Pt bib ems from home with reports of tripping over trailer hitch in driveway onto his L side. On the ground for approx 1 hour until wife found him. L hip pain with shortening and outward rotation. CNS intact. Denies LOC, did hit head, not anticoagulated. Denies neck/back pain. GCS 15. VSS with ems.

## 2022-07-06 NOTE — Progress Notes (Signed)
Pt arrived to 6 north room 27 via [facility]. Alert and oriented x4. Pain level 3/10. Bed in lowest position.Marland Kitchen alarm on. Call light in reach. Will continue to monitor pt.

## 2022-07-07 ENCOUNTER — Inpatient Hospital Stay (HOSPITAL_COMMUNITY): Payer: Medicare Other | Admitting: Anesthesiology

## 2022-07-07 ENCOUNTER — Inpatient Hospital Stay (HOSPITAL_COMMUNITY): Payer: Medicare Other

## 2022-07-07 ENCOUNTER — Other Ambulatory Visit: Payer: Self-pay

## 2022-07-07 ENCOUNTER — Encounter (HOSPITAL_COMMUNITY)
Admission: EM | Disposition: A | Discharge status: Skilled Nursing Facility | Payer: Self-pay | Source: Home / Self Care | Attending: Internal Medicine

## 2022-07-07 ENCOUNTER — Encounter (HOSPITAL_COMMUNITY): Payer: Self-pay | Admitting: Family Medicine

## 2022-07-07 DIAGNOSIS — Z7984 Long term (current) use of oral hypoglycemic drugs: Secondary | ICD-10-CM

## 2022-07-07 DIAGNOSIS — Z794 Long term (current) use of insulin: Secondary | ICD-10-CM

## 2022-07-07 DIAGNOSIS — S72142A Displaced intertrochanteric fracture of left femur, initial encounter for closed fracture: Secondary | ICD-10-CM

## 2022-07-07 DIAGNOSIS — E1149 Type 2 diabetes mellitus with other diabetic neurological complication: Secondary | ICD-10-CM

## 2022-07-07 HISTORY — PX: INTRAMEDULLARY (IM) NAIL INTERTROCHANTERIC: SHX5875

## 2022-07-07 LAB — GLUCOSE, CAPILLARY
Glucose-Capillary: 168 mg/dL — ABNORMAL HIGH (ref 70–99)
Glucose-Capillary: 185 mg/dL — ABNORMAL HIGH (ref 70–99)
Glucose-Capillary: 171 mg/dL — ABNORMAL HIGH (ref 70–99)
Glucose-Capillary: 185 mg/dL — ABNORMAL HIGH (ref 70–99)
Glucose-Capillary: 194 mg/dL — ABNORMAL HIGH (ref 70–99)
Glucose-Capillary: 229 mg/dL — ABNORMAL HIGH (ref 70–99)

## 2022-07-07 LAB — CBC
HCT: 34.4 % — ABNORMAL LOW (ref 39.0–52.0)
Hemoglobin: 11.8 g/dL — ABNORMAL LOW (ref 13.0–17.0)
MCH: 29.4 pg (ref 26.0–34.0)
MCHC: 34.3 g/dL (ref 30.0–36.0)
MCV: 85.8 fL (ref 80.0–100.0)
Platelets: 232 10*3/uL (ref 150–400)
RBC: 4.01 MIL/uL — ABNORMAL LOW (ref 4.22–5.81)
RDW: 14.5 % (ref 11.5–15.5)
WBC: 12.8 10*3/uL — ABNORMAL HIGH (ref 4.0–10.5)
nRBC: 0 % (ref 0.0–0.2)

## 2022-07-07 LAB — COMPREHENSIVE METABOLIC PANEL
ALT: 25 U/L (ref 0–44)
AST: 27 U/L (ref 15–41)
Albumin: 3.6 g/dL (ref 3.5–5.0)
Alkaline Phosphatase: 29 U/L — ABNORMAL LOW (ref 38–126)
Anion gap: 15 (ref 5–15)
BUN: 20 mg/dL (ref 8–23)
CO2: 24 mmol/L (ref 22–32)
Calcium: 9.2 mg/dL (ref 8.9–10.3)
Chloride: 101 mmol/L (ref 98–111)
Creatinine, Ser: 1.03 mg/dL (ref 0.61–1.24)
GFR, Estimated: >60 mL/min (ref >60)
Glucose, Bld: 145 mg/dL — ABNORMAL HIGH (ref 70–99)
Potassium: 3.8 mmol/L (ref 3.5–5.1)
Sodium: 140 mmol/L (ref 135–145)
Total Bilirubin: 1.3 mg/dL — ABNORMAL HIGH (ref 0.3–1.2)
Total Protein: 6.2 g/dL — ABNORMAL LOW (ref 6.5–8.1)

## 2022-07-07 LAB — HEMOGLOBIN A1C
Hgb A1c MFr Bld: 7.2 % — ABNORMAL HIGH (ref 4.8–5.6)
Mean Plasma Glucose: 159.94 mg/dL

## 2022-07-07 LAB — SURGICAL PCR SCREEN
MRSA, PCR: NEGATIVE
Staphylococcus aureus: NEGATIVE

## 2022-07-07 SURGERY — FIXATION, FRACTURE, INTERTROCHANTERIC, WITH INTRAMEDULLARY ROD
Anesthesia: General | Site: Leg Upper | Laterality: Left

## 2022-07-07 MED ORDER — FENTANYL CITRATE (PF) 100 MCG/2ML IJ SOLN
INTRAMUSCULAR | Status: AC
  Filled 2022-07-07: qty 2

## 2022-07-07 MED ORDER — SOD CITRATE-CITRIC ACID 500-334 MG/5ML PO SOLN
30.0000 mL | Freq: Once | ORAL | Status: AC
  Administered 2022-07-07: 30 mL via ORAL
  Filled 2022-07-07: qty 30

## 2022-07-07 MED ORDER — MENTHOL 3 MG MT LOZG: 1.0000 | LOZENGE | OROMUCOSAL | Status: DC | PRN

## 2022-07-07 MED ORDER — ONDANSETRON HCL 4 MG/2ML IJ SOLN
4.0000 mg | Freq: Four times a day (QID) | INTRAMUSCULAR | Status: DC | PRN
  Administered 2022-07-09: 4 mg via INTRAVENOUS
  Filled 2022-07-07 (×2): qty 2

## 2022-07-07 MED ORDER — PROPOFOL 10 MG/ML IV BOLUS
INTRAVENOUS | Status: DC | PRN
  Administered 2022-07-07: 100 mg via INTRAVENOUS

## 2022-07-07 MED ORDER — DULOXETINE HCL 30 MG PO CPEP
30.0000 mg | ORAL_CAPSULE | Freq: Every day | ORAL | Status: DC
  Administered 2022-07-07 – 2022-07-18 (×12): 30 mg via ORAL
  Filled 2022-07-07 (×12): qty 1

## 2022-07-07 MED ORDER — RAMIPRIL 5 MG PO CAPS
20.0000 mg | ORAL_CAPSULE | Freq: Every day | ORAL | Status: DC
  Administered 2022-07-07: 20 mg via ORAL
  Filled 2022-07-07 (×2): qty 4

## 2022-07-07 MED ORDER — GABAPENTIN 100 MG PO CAPS
200.0000 mg | ORAL_CAPSULE | Freq: Every day | ORAL | Status: DC
  Administered 2022-07-07 – 2022-07-17 (×12): 200 mg via ORAL
  Filled 2022-07-07 (×12): qty 2

## 2022-07-07 MED ORDER — OXYCODONE HCL 5 MG PO TABS
10.0000 mg | ORAL_TABLET | ORAL | Status: DC | PRN
  Administered 2022-07-08 – 2022-07-10 (×3): 10 mg via ORAL
  Filled 2022-07-07: qty 2

## 2022-07-07 MED ORDER — TRANEXAMIC ACID-NACL 1000-0.7 MG/100ML-% IV SOLN
1000.0000 mg | Freq: Once | INTRAVENOUS | Status: AC
  Administered 2022-07-07: 1000 mg via INTRAVENOUS
  Filled 2022-07-07: qty 100

## 2022-07-07 MED ORDER — CHLORHEXIDINE GLUCONATE 0.12 % MT SOLN
OROMUCOSAL | Status: AC
  Administered 2022-07-07: 15 mL via OROMUCOSAL
  Filled 2022-07-07: qty 15

## 2022-07-07 MED ORDER — POLYETHYLENE GLYCOL 3350 17 G PO PACK: 17.0000 g | PACK | Freq: Every day | ORAL | Status: DC | PRN

## 2022-07-07 MED ORDER — HYDROCHLOROTHIAZIDE 25 MG PO TABS
25.0000 mg | ORAL_TABLET | Freq: Every morning | ORAL | Status: DC
  Filled 2022-07-07: qty 1

## 2022-07-07 MED ORDER — AMLODIPINE BESYLATE 5 MG PO TABS
5.0000 mg | ORAL_TABLET | Freq: Every evening | ORAL | Status: DC
  Administered 2022-07-07 – 2022-07-18 (×12): 5 mg via ORAL
  Filled 2022-07-07 (×12): qty 1

## 2022-07-07 MED ORDER — ONDANSETRON HCL 4 MG/2ML IJ SOLN: 4.0000 mg | Freq: Once | INTRAMUSCULAR | Status: DC | PRN

## 2022-07-07 MED ORDER — ACETAMINOPHEN 325 MG PO TABS: 325.0000 mg | ORAL_TABLET | Freq: Four times a day (QID) | ORAL | Status: DC | PRN

## 2022-07-07 MED ORDER — METHOCARBAMOL 500 MG PO TABS
500.0000 mg | ORAL_TABLET | Freq: Four times a day (QID) | ORAL | Status: DC | PRN
  Administered 2022-07-08 – 2022-07-14 (×3): 500 mg via ORAL
  Filled 2022-07-07 (×3): qty 1

## 2022-07-07 MED ORDER — SORBITOL 70 % SOLN: 30.0000 mL | Freq: Every day | Status: DC | PRN

## 2022-07-07 MED ORDER — ENOXAPARIN SODIUM 40 MG/0.4ML IJ SOSY
40.0000 mg | PREFILLED_SYRINGE | INTRAMUSCULAR | Status: DC
  Administered 2022-07-08 – 2022-07-10 (×3): 40 mg via SUBCUTANEOUS
  Filled 2022-07-07 (×3): qty 0.4

## 2022-07-07 MED ORDER — GABAPENTIN 100 MG PO CAPS: 100.0000 mg | ORAL_CAPSULE | Freq: Three times a day (TID) | ORAL | Status: DC

## 2022-07-07 MED ORDER — FESOTERODINE FUMARATE ER 4 MG PO TB24
4.0000 mg | ORAL_TABLET | Freq: Every day | ORAL | Status: DC
  Administered 2022-07-07 – 2022-07-18 (×12): 4 mg via ORAL
  Filled 2022-07-07 (×12): qty 1

## 2022-07-07 MED ORDER — SUCCINYLCHOLINE CHLORIDE 200 MG/10ML IV SOSY
PREFILLED_SYRINGE | INTRAVENOUS | Status: DC | PRN
  Administered 2022-07-07: 120 mg via INTRAVENOUS

## 2022-07-07 MED ORDER — ATORVASTATIN CALCIUM 10 MG PO TABS
20.0000 mg | ORAL_TABLET | Freq: Every evening | ORAL | Status: DC
  Administered 2022-07-07 – 2022-07-18 (×12): 20 mg via ORAL
  Filled 2022-07-07 (×12): qty 2

## 2022-07-07 MED ORDER — OXYCODONE HCL 5 MG PO TABS: 5.0000 mg | ORAL_TABLET | Freq: Once | ORAL | Status: DC | PRN

## 2022-07-07 MED ORDER — 0.9 % SODIUM CHLORIDE (POUR BTL) OPTIME
TOPICAL | Status: DC | PRN
  Administered 2022-07-07: 1000 mL

## 2022-07-07 MED ORDER — METOPROLOL TARTRATE 25 MG PO TABS
25.0000 mg | ORAL_TABLET | Freq: Every morning | ORAL | Status: DC
  Administered 2022-07-07 – 2022-07-18 (×11): 25 mg via ORAL
  Filled 2022-07-07 (×12): qty 1

## 2022-07-07 MED ORDER — SODIUM CHLORIDE 0.9 % IV SOLN: INTRAVENOUS | Status: DC

## 2022-07-07 MED ORDER — FENTANYL CITRATE (PF) 250 MCG/5ML IJ SOLN
INTRAMUSCULAR | Status: DC | PRN
  Administered 2022-07-07: 25 ug via INTRAVENOUS
  Administered 2022-07-07 (×2): 50 ug via INTRAVENOUS

## 2022-07-07 MED ORDER — GLIPIZIDE-METFORMIN HCL 2.5-500 MG PO TABS: 2.0000 | ORAL_TABLET | Freq: Two times a day (BID) | ORAL | Status: DC

## 2022-07-07 MED ORDER — ROCURONIUM BROMIDE 10 MG/ML (PF) SYRINGE
PREFILLED_SYRINGE | INTRAVENOUS | Status: DC | PRN
  Administered 2022-07-07: 40 mg via INTRAVENOUS

## 2022-07-07 MED ORDER — FAMOTIDINE 20 MG PO TABS
20.0000 mg | ORAL_TABLET | Freq: Once | ORAL | Status: AC
  Administered 2022-07-07: 20 mg via ORAL
  Filled 2022-07-07: qty 1

## 2022-07-07 MED ORDER — ENOXAPARIN SODIUM 40 MG/0.4ML IJ SOSY: 40.0000 mg | PREFILLED_SYRINGE | Freq: Every day | INTRAMUSCULAR | 0 refills | Status: DC

## 2022-07-07 MED ORDER — ONDANSETRON HCL 4 MG PO TABS: 4.0000 mg | ORAL_TABLET | Freq: Four times a day (QID) | ORAL | Status: DC | PRN

## 2022-07-07 MED ORDER — OXYCODONE HCL 5 MG/5ML PO SOLN: 5.0000 mg | Freq: Once | ORAL | Status: DC | PRN

## 2022-07-07 MED ORDER — METHOCARBAMOL 1000 MG/10ML IJ SOLN: 500.0000 mg | Freq: Four times a day (QID) | INTRAVENOUS | Status: DC | PRN

## 2022-07-07 MED ORDER — PHENOL 1.4 % MT LIQD: 1.0000 | OROMUCOSAL | Status: DC | PRN

## 2022-07-07 MED ORDER — HYDROMORPHONE HCL 1 MG/ML IJ SOLN
0.5000 mg | INTRAMUSCULAR | Status: DC | PRN
  Administered 2022-07-07: 1 mg via INTRAVENOUS
  Filled 2022-07-07: qty 1

## 2022-07-07 MED ORDER — PROPOFOL 10 MG/ML IV BOLUS
INTRAVENOUS | Status: AC
  Filled 2022-07-07: qty 20

## 2022-07-07 MED ORDER — INSULIN LISPRO PROT & LISPRO (75-25 MIX) 100 UNIT/ML KWIKPEN: 22.0000 [IU] | PEN_INJECTOR | SUBCUTANEOUS | Status: DC

## 2022-07-07 MED ORDER — LACTATED RINGERS IV SOLN: INTRAVENOUS | Status: DC

## 2022-07-07 MED ORDER — DOCUSATE SODIUM 100 MG PO CAPS
100.0000 mg | ORAL_CAPSULE | Freq: Two times a day (BID) | ORAL | Status: DC
  Administered 2022-07-07 – 2022-07-13 (×6): 100 mg via ORAL
  Filled 2022-07-07 (×8): qty 1

## 2022-07-07 MED ORDER — ORAL CARE MOUTH RINSE: 15.0000 mL | Freq: Once | OROMUCOSAL | Status: AC

## 2022-07-07 MED ORDER — CEFAZOLIN SODIUM-DEXTROSE 2-4 GM/100ML-% IV SOLN
2.0000 g | Freq: Four times a day (QID) | INTRAVENOUS | Status: AC
  Administered 2022-07-07 – 2022-07-08 (×3): 2 g via INTRAVENOUS
  Filled 2022-07-07 (×3): qty 100

## 2022-07-07 MED ORDER — OXYCODONE HCL 5 MG PO TABS: 5.0000 mg | ORAL_TABLET | Freq: Three times a day (TID) | ORAL | 0 refills | Status: DC | PRN

## 2022-07-07 MED ORDER — LIDOCAINE 2% (20 MG/ML) 5 ML SYRINGE
INTRAMUSCULAR | Status: DC | PRN
  Administered 2022-07-07: 60 mg via INTRAVENOUS

## 2022-07-07 MED ORDER — ONDANSETRON HCL 4 MG/2ML IJ SOLN
INTRAMUSCULAR | Status: DC | PRN
  Administered 2022-07-07: 4 mg via INTRAVENOUS

## 2022-07-07 MED ORDER — HYDRALAZINE HCL 20 MG/ML IJ SOLN: 10.0000 mg | Freq: Four times a day (QID) | INTRAMUSCULAR | Status: DC | PRN

## 2022-07-07 MED ORDER — ALUM & MAG HYDROXIDE-SIMETH 200-200-20 MG/5ML PO SUSP
30.0000 mL | ORAL | Status: DC | PRN
  Administered 2022-07-08 – 2022-07-09 (×4): 30 mL via ORAL
  Filled 2022-07-07 (×4): qty 30

## 2022-07-07 MED ORDER — ALUM & MAG HYDROXIDE-SIMETH 200-200-20 MG/5ML PO SUSP
30.0000 mL | ORAL | Status: DC | PRN
  Administered 2022-07-07: 30 mL via ORAL
  Filled 2022-07-07: qty 30

## 2022-07-07 MED ORDER — ONDANSETRON HCL 4 MG/2ML IJ SOLN
4.0000 mg | Freq: Four times a day (QID) | INTRAMUSCULAR | Status: DC | PRN
  Administered 2022-07-07: 4 mg via INTRAVENOUS
  Filled 2022-07-07: qty 2

## 2022-07-07 MED ORDER — SUGAMMADEX SODIUM 200 MG/2ML IV SOLN
INTRAVENOUS | Status: DC | PRN
  Administered 2022-07-07: 200 mg via INTRAVENOUS

## 2022-07-07 MED ORDER — GLIPIZIDE 5 MG PO TABS
5.0000 mg | ORAL_TABLET | Freq: Two times a day (BID) | ORAL | Status: DC
  Administered 2022-07-07 – 2022-07-18 (×20): 5 mg via ORAL
  Filled 2022-07-07 (×25): qty 1

## 2022-07-07 MED ORDER — ACETAMINOPHEN 500 MG PO TABS
1000.0000 mg | ORAL_TABLET | Freq: Four times a day (QID) | ORAL | Status: AC
  Administered 2022-07-07 – 2022-07-08 (×4): 1000 mg via ORAL
  Filled 2022-07-07 (×3): qty 2

## 2022-07-07 MED ORDER — FENTANYL CITRATE (PF) 250 MCG/5ML IJ SOLN
INTRAMUSCULAR | Status: AC
  Filled 2022-07-07: qty 5

## 2022-07-07 MED ORDER — OXYCODONE HCL 5 MG PO TABS
5.0000 mg | ORAL_TABLET | ORAL | Status: DC | PRN
  Administered 2022-07-07: 5 mg via ORAL
  Administered 2022-07-08: 10 mg via ORAL
  Administered 2022-07-08 – 2022-07-09 (×2): 5 mg via ORAL
  Administered 2022-07-09 – 2022-07-11 (×2): 10 mg via ORAL
  Filled 2022-07-07 (×2): qty 2
  Filled 2022-07-07: qty 1
  Filled 2022-07-07: qty 2
  Filled 2022-07-07: qty 1
  Filled 2022-07-07 (×2): qty 2

## 2022-07-07 MED ORDER — MAGNESIUM CITRATE PO SOLN: 1.0000 | Freq: Once | ORAL | Status: DC | PRN

## 2022-07-07 MED ORDER — GABAPENTIN 100 MG PO CAPS
100.0000 mg | ORAL_CAPSULE | Freq: Two times a day (BID) | ORAL | Status: DC
  Administered 2022-07-07 – 2022-07-18 (×24): 100 mg via ORAL
  Filled 2022-07-07 (×24): qty 1

## 2022-07-07 MED ORDER — METFORMIN HCL 500 MG PO TABS
1000.0000 mg | ORAL_TABLET | Freq: Two times a day (BID) | ORAL | Status: DC
  Administered 2022-07-07 – 2022-07-18 (×22): 1000 mg via ORAL
  Filled 2022-07-07 (×22): qty 2

## 2022-07-07 MED ORDER — FENTANYL CITRATE (PF) 100 MCG/2ML IJ SOLN
25.0000 ug | INTRAMUSCULAR | Status: DC | PRN
  Administered 2022-07-07 (×3): 50 ug via INTRAVENOUS

## 2022-07-07 MED ORDER — CHLORHEXIDINE GLUCONATE 0.12 % MT SOLN: 15.0000 mL | Freq: Once | OROMUCOSAL | Status: AC

## 2022-07-07 SURGICAL SUPPLY — 49 items
BAG COUNTER SPONGE SURGICOUNT (BAG) ×1 IMPLANT
BIT DRILL INTERTAN LAG SCREW (BIT) IMPLANT
BIT DRILL SHORT 4.0 (BIT) IMPLANT
BNDG COHESIVE 4X5 TAN STRL (GAUZE/BANDAGES/DRESSINGS) ×1 IMPLANT
BNDG COHESIVE 6X5 TAN ST LF (GAUZE/BANDAGES/DRESSINGS) IMPLANT
BNDG GAUZE DERMACEA FLUFF 4 (GAUZE/BANDAGES/DRESSINGS) ×1 IMPLANT
COVER PERINEAL POST (MISCELLANEOUS) ×1 IMPLANT
COVER SURGICAL LIGHT HANDLE (MISCELLANEOUS) ×1 IMPLANT
DRAPE C-ARMOR (DRAPES) ×1 IMPLANT
DRAPE STERI IOBAN 125X83 (DRAPES) ×1 IMPLANT
DRESSING MEPILEX FLEX 4X4 (GAUZE/BANDAGES/DRESSINGS) ×1 IMPLANT
DRILL BIT SHORT 4.0 (BIT) ×1
DRSG MEPILEX FLEX 4X4 (GAUZE/BANDAGES/DRESSINGS) ×2
DRSG MEPILEX POST OP 4X8 (GAUZE/BANDAGES/DRESSINGS) ×1 IMPLANT
DURAPREP 26ML APPLICATOR (WOUND CARE) ×1 IMPLANT
ELECT REM PT RETURN 9FT ADLT (ELECTROSURGICAL) ×1
ELECTRODE REM PT RTRN 9FT ADLT (ELECTROSURGICAL) ×1 IMPLANT
GAUZE PAD ABD 8X10 STRL (GAUZE/BANDAGES/DRESSINGS) ×2 IMPLANT
GLOVE BIOGEL PI IND STRL 7.0 (GLOVE) ×2 IMPLANT
GLOVE BIOGEL PI IND STRL 7.5 (GLOVE) ×1 IMPLANT
GLOVE ECLIPSE 7.0 STRL STRAW (GLOVE) ×1 IMPLANT
GLOVE SKINSENSE STRL SZ7.5 (GLOVE) ×2 IMPLANT
GLOVE SURG SYN 7.5  E (GLOVE) ×2
GLOVE SURG SYN 7.5 E (GLOVE) ×2 IMPLANT
GLOVE SURG SYN 7.5 PF PI (GLOVE) ×2 IMPLANT
GLOVE SURG UNDER POLY LF SZ7 (GLOVE) ×19 IMPLANT
GLOVE SURG UNDER POLY LF SZ7.5 (GLOVE) ×4 IMPLANT
GOWN STRL SURGICAL XL XLNG (GOWN DISPOSABLE) ×1 IMPLANT
GUIDE PIN 3.2X343 (PIN) ×2
GUIDE PIN 3.2X343MM (PIN) ×2
KIT BASIN OR (CUSTOM PROCEDURE TRAY) ×1 IMPLANT
KIT TURNOVER KIT B (KITS) ×1 IMPLANT
MANIFOLD NEPTUNE II (INSTRUMENTS) ×1 IMPLANT
NAIL TRIGEN LEFT 10X38-125 (Nail) IMPLANT
NS IRRIG 1000ML POUR BTL (IV SOLUTION) ×1 IMPLANT
PACK GENERAL/GYN (CUSTOM PROCEDURE TRAY) ×1 IMPLANT
PAD ARMBOARD 7.5X6 YLW CONV (MISCELLANEOUS) ×2 IMPLANT
PAD CAST 4YDX4 CTTN HI CHSV (CAST SUPPLIES) ×2 IMPLANT
PADDING CAST COTTON 4X4 STRL (CAST SUPPLIES) ×2
PIN GUIDE 3.2X343MM (PIN) IMPLANT
SCREW LAG COMPR KIT 95/90 (Screw) IMPLANT
STAPLER VISISTAT 35W (STAPLE) ×1 IMPLANT
SUT VIC AB 0 CT1 27 (SUTURE) ×1
SUT VIC AB 0 CT1 27XBRD ANBCTR (SUTURE) ×1 IMPLANT
SUT VIC AB 2-0 CT1 27 (SUTURE) ×1
SUT VIC AB 2-0 CT1 TAPERPNT 27 (SUTURE) ×1 IMPLANT
TOWEL GREEN STERILE (TOWEL DISPOSABLE) ×1 IMPLANT
TOWEL GREEN STERILE FF (TOWEL DISPOSABLE) ×1 IMPLANT
WATER STERILE IRR 1000ML POUR (IV SOLUTION) ×1 IMPLANT

## 2022-07-07 NOTE — H&P (Signed)

## 2022-07-07 NOTE — Op Note (Signed)
   Date of Surgery: 07/07/2022  INDICATIONS: Alejandro Foster is a 89 y.o.-year-old male who sustained a left hip fracture. The risks and benefits of the procedure discussed with the patient prior to the procedure and all questions were answered; consent was obtained.  PREOPERATIVE DIAGNOSIS: Left intertrochanteric hip fracture   POSTOPERATIVE DIAGNOSIS: Same   PROCEDURE: Open treatment of intertrochanteric fracture with intramedullary implant. CPT [phone]   SURGEON: N. Glee Arvin, M.D.   ASSIST: None  ANESTHESIA: general   IV FLUIDS AND URINE: See anesthesia record   ESTIMATED BLOOD LOSS: 100 cc  IMPLANTS: [employer] InterTAN 10 x 38 cm, 95/90 compression screws  DRAINS: None.   COMPLICATIONS: see description of procedure.   DESCRIPTION OF PROCEDURE: The patient was brought to the operating room and placed supine on the operating table. The patient's leg had been signed prior to the procedure. The patient had the anesthesia placed by the anesthesiologist. The prep verification and incision time-outs were performed to confirm that this was the correct patient, site, side and location. The patient had an SCD on the opposite lower extremity. The patient did receive antibiotics prior to the incision and was re-dosed during the procedure as needed at indicated intervals. The patient was positioned on the fracture table with the table in traction and internal rotation to reduce the hip. The well leg was placed in a scissor position and all bony prominences were well-padded. The patient had the lower extremity prepped and draped in the standard surgical fashion. The incision was made 4 finger breadths superior to the greater trochanter. A guide pin was inserted into the tip of the greater trochanter under fluoroscopic guidance. An opening reamer was used to gain access to the femoral canal. The nail length was measured and inserted down the femoral canal to its proper depth. The appropriate version  of insertion for the lag screw was found under fluoroscopy. A pin was inserted up the femoral neck through the jig. Then, a second antirotation pin was inserted inferior to the first pin. The length of the lag screw was then measured. The lag screw was inserted as near to center-center in the head as possible. The antirotation pin was then taken out and an interdigitating compression screw was placed in its place. The leg was taken out of traction, then the interdigitating compression screw was used to compress across the fracture. Compression was visualized on serial xrays.  The set screw was tightened.  I elected not to use a distal interlocking screw due to a robust lateral wall.  I felt the fracture was a stable pattern.  The wound was copiously irrigated with saline and the subcutaneous layer closed with 2.0 vicryl and the skin was reapproximated with staples. The wounds were cleaned and dried a final time and a sterile dressing was placed. The hip was taken through a range of motion at the end of the case under fluoroscopic imaging to visualize the approach-withdraw phenomenon and confirm implant length in the head. The patient was then awakened from anesthesia and taken to the recovery room in stable condition. All counts were correct at the end of the case.   POSTOPERATIVE PLAN: The patient will be weight bearing as tolerated and will return in 2 weeks for staple removal and the patient will receive DVT prophylaxis based on other medications, activity level, and risk ratio of bleeding to thrombosis.   Mayra Reel, MD [facility] 3:09 PM

## 2022-07-07 NOTE — Anesthesia Preprocedure Evaluation (Signed)
Anesthesia Evaluation  Patient identified by MRN, date of birth, ID band Patient awake    Reviewed: Allergy & Precautions, NPO status , Patient's Chart, lab work & pertinent test results, reviewed documented beta blocker date and time   History of Anesthesia Complications Negative for: history of anesthetic complications  Airway Mallampati: II  TM Distance: >3 FB Neck ROM: Full    Dental  (+) Dental Advisory Given, Implants   Pulmonary former smoker   Pulmonary exam normal        Cardiovascular hypertension, Pt. on medications and Pt. on home beta blockers + CAD, + Cardiac Stents and + Peripheral Vascular Disease  Normal cardiovascular exam     Neuro/Psych  Hearing aids   Neuromuscular disease  negative psych ROS   GI/Hepatic Neg liver ROS,GERD  Medicated,,  Endo/Other  diabetes, Type 2, Oral Hypoglycemic Agents, Insulin Dependent    Renal/GU negative Renal ROS     Musculoskeletal  (+) Arthritis ,    Abdominal   Peds  Hematology  (+) Blood dyscrasia, anemia   Anesthesia Other Findings   Reproductive/Obstetrics                             Anesthesia Physical Anesthesia Plan  ASA: 3  Anesthesia Plan: General   Post-op Pain Management: Tylenol PO (pre-op)*   Induction: Intravenous and Rapid sequence  PONV Risk Score and Plan: 2 and Treatment may vary due to age or medical condition, Ondansetron and Propofol infusion  Airway Management Planned: Oral ETT  Additional Equipment: None  Intra-op Plan:   Post-operative Plan: Extubation in OR  Informed Consent: I have reviewed the patients History and Physical, chart, labs and discussed the procedure including the risks, benefits and alternatives for the proposed anesthesia with the patient or authorized representative who has indicated his/her understanding and acceptance.     Dental advisory given  Plan Discussed with: CRNA  and Anesthesiologist  Anesthesia Plan Comments:        Anesthesia Quick Evaluation

## 2022-07-07 NOTE — Anesthesia Postprocedure Evaluation (Signed)
Anesthesia Post Note  Patient: TIBURCIO LINDER  Procedure(s) Performed: INTRAMEDULLARY (IM) NAILING OF LEFT FEMUR (Left: Leg Upper)     Patient location during evaluation: PACU Anesthesia Type: General Level of consciousness: awake and alert Pain management: pain level controlled Vital Signs Assessment: post-procedure vital signs reviewed and stable Respiratory status: spontaneous breathing, nonlabored ventilation, respiratory function stable and patient connected to nasal cannula oxygen Cardiovascular status: blood pressure returned to baseline and stable Postop Assessment: no apparent nausea or vomiting Anesthetic complications: no   No notable events documented.  Last Vitals:  Vitals:   07/07/22 1600 07/07/22 1615  BP: (!) 122/53 (!) 142/63  Pulse: 68 69  Resp: 17 13  Temp:    SpO2: 96% 96%    Last Pain:  Vitals:   07/07/22 1600  TempSrc:   PainSc: 5                  Beryle Lathe

## 2022-07-07 NOTE — Transfer of Care (Signed)
Immediate Anesthesia Transfer of Care Note  Patient: Alejandro Foster  Procedure(s) Performed: INTRAMEDULLARY (IM) NAILING OF LEFT FEMUR (Left: Leg Upper)  Patient Location: PACU  Anesthesia Type:General  Level of Consciousness: drowsy  Airway & Oxygen Therapy: Patient Spontanous Breathing and Patient connected to nasal cannula oxygen  Post-op Assessment: Report given to RN and Post -op Vital signs reviewed and stable  Post vital signs: Reviewed and stable  Last Vitals:  Vitals Value Taken Time  BP 147/85   Temp    Pulse 71 07/07/22 1534  Resp 22 07/07/22 1534  SpO2 91 % 07/07/22 1534  Vitals shown include unvalidated device data.  Last Pain:  Vitals:   07/07/22 1314  TempSrc: Oral  PainSc:          Complications: No notable events documented.

## 2022-07-07 NOTE — Anesthesia Procedure Notes (Signed)
Procedure Name: Intubation Date/Time: 07/07/2022 2:22 PM  Performed by: Katina Degree, CRNAPre-anesthesia Checklist: Patient identified, Emergency Drugs available, Suction available and Patient being monitored Patient Re-evaluated:Patient Re-evaluated prior to induction Oxygen Delivery Method: Circle system utilized Preoxygenation: Pre-oxygenation with 100% oxygen Induction Type: IV induction, Rapid sequence and Cricoid Pressure applied Laryngoscope Size: Mac and 4 Grade View: Grade I Tube type: Oral Tube size: 7.5 mm Number of attempts: 1 Airway Equipment and Method: Stylet and Oral airway Placement Confirmation: ETT inserted through vocal cords under direct vision, positive ETCO2 and breath sounds checked- equal and bilateral Secured at: 22 cm Tube secured with: Tape Dental Injury: Teeth and Oropharynx as per pre-operative assessment

## 2022-07-07 NOTE — Consult Note (Signed)
Reason for Consult:Left hip fx Referring Physician: Melina Schools Dahal Time called: 0730 Time at bedside: 0859   Alejandro Foster is an 89 y.o. male.  HPI: [name] was in the driveway when he tripped and fell. He had immediate left hip pain and could not get up. He was brought to the ED where x-rays showed a left hip fx and orthopedic surgery was consulted. He lives at home with his wife and does not use any assistive devices to ambulate.  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 11/10/2019   Coronary artery disease    cardiologist-  dr Donnie Aho--  s/p  cardiac cath's w/ angioplasty and stenting x2   DDD (degenerative disc disease), lumbar    Diabetic peripheral neuropathy associated with type 2 diabetes mellitus 12/12/2017   Dyslipidemia 01/25/2018   Dyspnea on exertion 08/22/2019   Foraminal stenosis of lumbar region 12/12/2017   History of kidney stones    Hyperlipidemia    Hypertension    Lumbar radiculopathy, right    right leg weakness   Myofascial pain syndrome 12/12/2017   Nephrolithiasis    bilateral non-obstructive per ct 01-19-2016   Peripheral neuropathy    feet   Posterior vitreous detachment of both eyes 11/10/2019   Pseudophakia of both eyes 11/10/2019   Right leg weakness    due to lumbar radiculopathy   Right ureteral stone    S/P coronary artery stent placement    2002 x1  and 2003 x1   Spondylosis without myelopathy or radiculopathy, lumbar region 12/12/2017   Trigger finger 05/28/2018   Type 2 diabetes mellitus    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 11/10/2019   Wears hearing aid    BILATERAL    Past Surgical History:  Procedure Laterality Date   CARDIAC CATHETERIZATION  01/ 2002   dr Donnie Aho   mLAD 30%,  CFX OM3 40%,  RCA 30% previous stent site   CARDIOVASCULAR STRESS TEST   04-15-2009  dr Donnie Aho   normal nuclear study w/ no ischemia/  normal LV function and wall motion , ef 77%   CATARACT EXTRACTION W/ INTRAOCULAR LENS  IMPLANT, BILATERAL  2013  approx.   COLONOSCOPY WITH PROPOFOL  2014   CORONARY ANGIOPLASTY  1990  dr Donnie Aho   PTCA to OM2   CORONARY ANGIOPLASTY  10/ 1996  dr Donnie Aho   PTCA to RCA   CORONARY ANGIOPLASTY WITH STENT PLACEMENT  04/ 1996  dr Donnie Aho   stenting to RCA   CYSTOSCOPY WITH URETEROSCOPY AND STENT PLACEMENT Right 03/28/2016   Procedure: CYSTOSCOPY WITH RIGHT  URETEROSCOPY AND STENT PLACEMENT;  Surgeon: Jerilee Field, MD;  Location: [facility];  Service: Urology;  Laterality: Right;   EXTRACORPOREAL SHOCK WAVE LITHOTRIPSY  yrs ago   HOLMIUM LASER APPLICATION Right 03/28/2016   Procedure: HOLMIUM LASER APPLICATION;  Surgeon: Jerilee Field, MD;  Location: [facility];  Service: Urology;  Laterality: Right;   LAPAROSCOPIC CHOLECYSTECTOMY  1990's   TONSILLECTOMY AND ADENOIDECTOMY  child   URETEROLITHOTOMY  1990's    Family History  Problem Relation Age of Onset   Gallbladder disease Father    Cancer Brother     Social History:  reports that he quit smoking about 33 years ago. His smoking use included cigarettes. He has a 40.00 pack-year smoking history. He has  never used smokeless tobacco. He reports current alcohol use. He reports that he does not use drugs.  Allergies:  Allergies  Allergen Reactions   Sulfa Antibiotics Other (See Comments)    Unknown reaction- childhood allergy    Medications: I have reviewed the patient's current medications.  Results for orders placed or performed during the hospital encounter of 07/06/22 (from the past 48 hour(s))  Basic metabolic panel     Status: Abnormal   Collection Time: 07/06/22 12:15 PM  Result Value Ref Range   Sodium 137 135 - 145 mmol/L   Potassium 4.1 3.5 - 5.1 mmol/L   Chloride 105 98 - 111 mmol/L   CO2 22 22 - 32 mmol/L   Glucose, Bld 208 (H)  70 - 99 mg/dL    Comment: Glucose reference range applies only to samples taken after fasting for at least 8 hours.   BUN 19 8 - 23 mg/dL   Creatinine, Ser 1.30 0.61 - 1.24 mg/dL   Calcium 9.0 8.9 - 86.5 mg/dL   GFR, Estimated >78 >46 mL/min    Comment: (NOTE) Calculated using the CKD-EPI Creatinine Equation (2021)    Anion gap 10 5 - 15    Comment: Performed at [facility], [address] [place], [place] [address]  CBC with Differential     Status: Abnormal   Collection Time: 07/06/22 12:15 PM  Result Value Ref Range   WBC 7.1 4.0 - 10.5 K/uL   RBC 4.45 4.22 - 5.81 MIL/uL   Hemoglobin 12.7 (L) 13.0 - 17.0 g/dL   HCT 28.4 13.2 - 44.0 %   MCV 88.8 80.0 - 100.0 fL   MCH 28.5 26.0 - 34.0 pg   MCHC 32.2 30.0 - 36.0 g/dL   RDW 10.2 72.5 - 36.6 %   Platelets 237 150 - 400 K/uL   nRBC 0.0 0.0 - 0.2 %   Neutrophils Relative % 59 %   Neutro Abs 4.1 1.7 - 7.7 K/uL   Lymphocytes Relative 26 %   Lymphs Abs 1.9 0.7 - 4.0 K/uL   Monocytes Relative 11 %   Monocytes Absolute 0.8 0.1 - 1.0 K/uL   Eosinophils Relative 3 %   Eosinophils Absolute 0.2 0.0 - 0.5 K/uL   Basophils Relative 1 %   Basophils Absolute 0.1 0.0 - 0.1 K/uL   Immature Granulocytes 0 %   Abs Immature Granulocytes 0.03 0.00 - 0.07 K/uL    Comment: Performed at [facility], [address] [place], [place] [address]  Glucose, capillary     Status: Abnormal   Collection Time: 07/06/22  5:14 PM  Result Value Ref Range   Glucose-Capillary 120 (H) 70 - 99 mg/dL    Comment: Glucose reference range applies only to samples taken after fasting for at least 8 hours.  Glucose, capillary     Status: Abnormal   Collection Time: 07/06/22  8:29 PM  Result Value Ref Range   Glucose-Capillary 108 (H) 70 - 99 mg/dL    Comment: Glucose reference range applies only to samples taken after fasting for at least 8 hours.  Surgical pcr screen     Status: None   Collection Time: 07/07/22 12:39 AM    Specimen: Nasal Mucosa; Nasal Swab  Result Value Ref Range   MRSA, PCR NEGATIVE NEGATIVE   Staphylococcus aureus NEGATIVE NEGATIVE    Comment: (NOTE) The Xpert SA Assay (FDA approved for NASAL specimens in patients 28 years of age and older), is one component  of a comprehensive surveillance program. It is not intended to diagnose infection nor to guide or monitor treatment. Performed at [facility], [address] [place], [place] [address]   Comprehensive metabolic panel     Status: Abnormal   Collection Time: 07/07/22 12:49 AM  Result Value Ref Range   Sodium 140 135 - 145 mmol/L   Potassium 3.8 3.5 - 5.1 mmol/L   Chloride 101 98 - 111 mmol/L   CO2 24 22 - 32 mmol/L   Glucose, Bld 145 (H) 70 - 99 mg/dL    Comment: Glucose reference range applies only to samples taken after fasting for at least 8 hours.   BUN 20 8 - 23 mg/dL   Creatinine, Ser 6.04 0.61 - 1.24 mg/dL   Calcium 9.2 8.9 - 54.0 mg/dL   Total Protein 6.2 (L) 6.5 - 8.1 g/dL   Albumin 3.6 3.5 - 5.0 g/dL   AST 27 15 - 41 U/L   ALT 25 0 - 44 U/L   Alkaline Phosphatase 29 (L) 38 - 126 U/L   Total Bilirubin 1.3 (H) 0.3 - 1.2 mg/dL   GFR, Estimated >98 >11 mL/min    Comment: (NOTE) Calculated using the CKD-EPI Creatinine Equation (2021)    Anion gap 15 5 - 15    Comment: Performed at [facility], [address] [place], [place] [address]  CBC     Status: Abnormal   Collection Time: 07/07/22 12:49 AM  Result Value Ref Range   WBC 12.8 (H) 4.0 - 10.5 K/uL   RBC 4.01 (L) 4.22 - 5.81 MIL/uL   Hemoglobin 11.8 (L) 13.0 - 17.0 g/dL   HCT 29.5 (L) 62.1 - 30.8 %   MCV 85.8 80.0 - 100.0 fL   MCH 29.4 26.0 - 34.0 pg   MCHC 34.3 30.0 - 36.0 g/dL   RDW 65.7 84.6 - 96.2 %   Platelets 232 150 - 400 K/uL   nRBC 0.0 0.0 - 0.2 %    Comment: Performed at [facility], [address] [place], [place] [address]    CT Head Wo Contrast  Result Date: 07/06/2022 CLINICAL DATA:  Head trauma, minor (Age >= 65y)  EXAM: CT HEAD WITHOUT CONTRAST TECHNIQUE: Contiguous axial images were obtained from the base of the skull through the vertex without intravenous contrast. RADIATION DOSE REDUCTION: This exam was performed according to the departmental dose-optimization program which includes automated exposure control, adjustment of the mA and/or kV according to patient size and/or use of iterative reconstruction technique. COMPARISON:  None Available. FINDINGS: Brain: No evidence of acute infarction, hemorrhage, hydrocephalus, extra-axial collection or mass lesion/mass effect. Cerebral atrophy. Vascular: No hyperdense vessel. Skull: No acute fracture. Sinuses/Orbits: Mild paranasal sinus mucosal thickening. No acute orbital findings. Other: No mastoid effusions. IMPRESSION: No evidence of acute intracranial abnormality. Electronically Signed   By: Feliberto Harts M.D.   On: 07/06/2022 14:26   CT Cervical Spine Wo Contrast  Result Date: 07/06/2022 CLINICAL DATA:  Trauma EXAM: CT CERVICAL SPINE WITHOUT CONTRAST TECHNIQUE: Multidetector CT imaging of the cervical spine was performed without intravenous contrast. Multiplanar CT image reconstructions were also generated. RADIATION DOSE REDUCTION: This exam was performed according to the departmental dose-optimization program which includes automated exposure control, adjustment of the mA and/or kV according to patient size and/or use of iterative reconstruction technique. COMPARISON:  None Available. FINDINGS: Alignment: Normal Skull base and vertebrae: No compression deformities. Osseous structures are osteopenic. No osteolytic or osteoblastic lesions.  Soft tissues and spinal canal: No prevertebral fluid or swelling. No visible canal hematoma. Disc levels: Disc space narrowing with marginal osteophyte formation identified at C5-6 and C6-7. Facet joint osteoarthritic changes identified on the left C2-3, on the right C3-4, bilateral C4-5, bilateral C5-6, and bilaterally C7-T1.  Upper chest: Negative. Other: None. IMPRESSION: Degenerative changes. Osteopenia. No acute traumatic abnormalities. Electronically Signed   By: Layla Maw M.D.   On: 07/06/2022 14:19   DG Hip Unilat W or Wo Pelvis 2-3 Views Left  Result Date: 07/06/2022 CLINICAL DATA:  Pain after fall EXAM: DG HIP (WITH OR WITHOUT PELVIS) 3V LEFT COMPARISON:  None Available. FINDINGS: Comminuted displaced and angulated intertrochanteric left hip fracture. No additional fracture or dislocation. Mild joint space loss of the hips. Mild degenerative changes of the left sacroiliac joint. Vascular calcifications. Prominent degenerative changes of the lumbar spine at the edge of the imaging field. IMPRESSION: Comminuted displaced and angulated intertrochanteric fracture of the left hip Electronically Signed   By: Karen Kays M.D.   On: 07/06/2022 13:27    Review of Systems  HENT:  Negative for ear discharge, ear pain, hearing loss and tinnitus.   Eyes:  Negative for photophobia and pain.  Respiratory:  Negative for cough and shortness of breath.   Cardiovascular:  Negative for chest pain.  Gastrointestinal:  Negative for abdominal pain, nausea and vomiting.  Genitourinary:  Negative for dysuria, flank pain, frequency and urgency.  Musculoskeletal:  Positive for arthralgias (Left hip). Negative for back pain, myalgias and neck pain.  Neurological:  Negative for dizziness and headaches.  Hematological:  Does not bruise/bleed easily.  Psychiatric/Behavioral:  The patient is not nervous/anxious.    Blood pressure (!) 141/54, pulse 64, temperature 97.8 F (36.6 C), temperature source Oral, resp. rate 18, SpO2 95 %. Physical Exam Constitutional:      General: He is not in acute distress.    Appearance: He is well-developed. He is not diaphoretic.  HENT:     Head: Normocephalic and atraumatic.  Eyes:     General: No scleral icterus.       Right eye: No discharge.        Left eye: No discharge.      Conjunctiva/sclera: Conjunctivae normal.  Cardiovascular:     Rate and Rhythm: Normal rate and regular rhythm.  Pulmonary:     Effort: Pulmonary effort is normal. No respiratory distress.  Musculoskeletal:     Cervical back: Normal range of motion.     Comments: LLE No traumatic wounds, ecchymosis, or rash  Mild TTP hip, in [place]  No knee or ankle effusion  Knee stable to varus/ valgus and anterior/posterior stress  Sens DPN, SPN, TN intact  Motor EHL 5/5  DP 2+, No significant edema  Skin:    General: Skin is warm and dry.  Neurological:     Mental Status: He is alert.  Psychiatric:        Mood and Affect: Mood normal.        Behavior: Behavior normal.     Assessment/Plan: Left hip fx -- Plan IMN today with Dr. Roda Shutters. Please keep NPO.    Freeman Caldron, PA-C Orthopedic Surgery [phone] 07/07/2022, 9:14 AM

## 2022-07-07 NOTE — Progress Notes (Signed)
PROGRESS NOTE  Alejandro Foster  DOB: 20-Dec-1933  PCP: Creola Corn, MD WUJ:[id number]  DOA: 07/06/2022  LOS: 1 day  Hospital Day: 2  Brief narrative: Alejandro Foster is a 89 y.o. male with PMH significant for DM2, HTN, HLD, CAD/stents, DDD, lumbar spine stenosis, radiculopathy, chronic back pain, peripheral neuropathy, nephrolithiasis who lives at home and does not use any assistive devices to ambulate 4/18, patient was brought to the ED by EMS after a fall at home. Per report, patient was working around his car in his driveway when he tripped and fell hitting his left hip and head, sustained immediate left hip pain and was unable to get up.  Denies LOC  X-rays in the ED showed comminuted displaced and angulated intertrochanteric fracture of the left hip Orthopedic surgery was consulted Admitted to [facility]  Subjective: Patient was seen and examined this morning.  Pleasant elderly Caucasian male.  Lying on bed.  Not in distress.  Wife at bedside. Chart reviewed Afebrile, heart rate in 60s and 70s, blood pressure in 130s and 140s, breathing on room air  Assessment and plan: Comminuted Displaced and Angulated Intertrochanteric Fracture of the Left Hip  After mechanical fall Orthopedics consulted Noted to plan of intramedullary nailing today.  Remains n.p.o. Pain management and DVT prophylaxis per Ortho.  Essential hypertension PTA on metoprolol titrate 25 mg daily, amlodipine 5 mg daily, HCTZ 25 mg daily, ramipril 20 mg daily Continue metoprolol and amlodipine.  Keep HCTZ on ramipril on hold perioperatively Continue monitor blood pressure.  Hydralazine IV as needed.   CAD/HLD Recent low risk stress test with Dr. Rosemary Holms PTA on aspirin 81 mg daily, statin and fibrate Resume aspirin after surgery Continue statin and fibrate  Type 2 diabetes mellitus A1c pending PTA on Humalog 75/25 twice daily-35 units AM, 22 units p.m. also on glipizide 2.5 mg twice daily and metformin 5 mg twice  daily Currently oral meds on hold. Currently on basal bolus regimen with 15 units Levemir twice daily, scheduled Premeal aspart 4 units 3 times daily and sliding scale insulin Continue Neurontin for neuropathy No results found for: "HGBA1C" Recent Labs  Lab 07/06/22 1714 07/06/22 2029 07/07/22 0916  GLUCAP 120* 108* 168*   Chronic back pain On Cymbalta 30 mg daily  Overactive bladder Vesicare  Insomnia Ambien nightly    Mobility: Needs PT eval postprocedure  Goals of care   Code Status: Full Code     DVT prophylaxis:  SCDs Start: 07/07/22 0026   Antimicrobials: None Fluid: Start on NS at 50 mill per hour Consultants: Orthopedics Family Communication: Wife at bedside  Status: Inpatient Level of care:  Telemetry Medical   Needs to continue in-hospital care:  Needs surgical fixation of hip  Patient from: Home Anticipated d/c to: Pending surgery and clinical course   Diet:  Diet Order             Diet NPO time specified Except for: Sips with Meds  Diet effective now                   Scheduled Meds:  acetaminophen  1,000 mg Oral Q8H   amLODipine  5 mg Oral QPM   atorvastatin  20 mg Oral QPM   DULoxetine  30 mg Oral Daily   fesoterodine  4 mg Oral Daily   gabapentin  100 mg Oral BID   And   gabapentin  200 mg Oral QHS   insulin aspart  0-15 Units Subcutaneous TID WC  insulin aspart  0-5 Units Subcutaneous QHS   insulin aspart  4 Units Subcutaneous TID WC   insulin detemir  15 Units Subcutaneous BID   metoprolol tartrate  25 mg Oral q morning   pantoprazole  40 mg Oral Daily   polyethylene glycol  17 g Oral Daily   povidone-iodine  2 Application Topical Once   tranexamic acid (CYKLOKAPRON) 2,000 mg in sodium chloride 0.9 % 50 mL Topical Application  2,000 mg Topical To OR    PRN meds: acetaminophen **FOLLOWED BY** [START ON 07/09/2022] acetaminophen, alum & mag hydroxide-simeth, hydrALAZINE, morphine injection, ondansetron **OR** ondansetron  (ZOFRAN) IV, oxyCODONE, polyethylene glycol, zolpidem   Infusions:    ceFAZolin (ANCEF) IV     tranexamic acid      Antimicrobials: Anti-infectives (From admission, onward)    Start     Dose/Rate Route Frequency Ordered Stop   07/07/22 0600  ceFAZolin (ANCEF) IVPB 2g/100 mL premix        2 g 200 mL/hr over 30 Minutes Intravenous On call to O.R. 07/06/22 1925 07/08/22 0559       Nutritional status:  There is no height or weight on file to calculate BMI.          Objective: Vitals:   07/07/22 0515 07/07/22 0919  BP: (!) 141/54 135/60  Pulse: 64 71  Resp: 18 17  Temp: 97.8 F (36.6 C) 98.3 F (36.8 C)  SpO2: 95% 93%    Intake/Output Summary (Last 24 hours) at 07/07/2022 1121 Last data filed at 07/07/2022 0953 Gross per 24 hour  Intake 120 ml  Output 300 ml  Net -180 ml   There were no vitals filed for this visit. Weight change:  There is no height or weight on file to calculate BMI.   Physical Exam: General exam: Pleasant, elderly Caucasian male.  Not in distress.  Pain controlled Skin: No rashes, lesions or ulcers. HEENT: Atraumatic, normocephalic, no obvious bleeding Lungs: Clear to auscultation bilaterally CVS: Regular rate and rhythm, no murmur GI/Abd soft, nontender, nondistended, bowel sound present CNS: Alert, awake, oriented x 3 Psychiatry: Mood appropriate Extremities: No pedal edema, no calf tenderness  Data Review: I have personally reviewed the laboratory data and studies available.  F/u labs ordered Unresulted Labs (From admission, onward)     Start     Ordered   07/07/22 1122  Hemoglobin A1c  Add-on,   AD        07/07/22 1121            Total time spent in review of labs and imaging, patient evaluation, formulation of plan, documentation and communication with family: 55 minutes  Signed, Lorin Glass, MD [facility] 07/07/2022

## 2022-07-07 NOTE — Discharge Instructions (Signed)
° ° °  1. Change dressings as needed °2. May shower but keep incisions covered and dry °3. Take lovenox to prevent blood clots °4. Take stool softeners as needed °5. Take pain meds as needed ° °

## 2022-07-08 DIAGNOSIS — S72142A Displaced intertrochanteric fracture of left femur, initial encounter for closed fracture: Secondary | ICD-10-CM | POA: Diagnosis not present

## 2022-07-08 LAB — CBC WITH DIFFERENTIAL/PLATELET
Abs Immature Granulocytes: 0.08 10*3/uL — ABNORMAL HIGH (ref 0.00–0.07)
Basophils Absolute: 0 10*3/uL (ref 0.0–0.1)
Basophils Relative: 0 %
Eosinophils Absolute: 0 10*3/uL (ref 0.0–0.5)
Eosinophils Relative: 0 %
HCT: 32.8 % — ABNORMAL LOW (ref 39.0–52.0)
Hemoglobin: 10.8 g/dL — ABNORMAL LOW (ref 13.0–17.0)
Immature Granulocytes: 1 %
Lymphocytes Relative: 12 %
Lymphs Abs: 1.7 10*3/uL (ref 0.7–4.0)
MCH: 29 pg (ref 26.0–34.0)
MCHC: 32.9 g/dL (ref 30.0–36.0)
MCV: 88.2 fL (ref 80.0–100.0)
Monocytes Absolute: 1.8 10*3/uL — ABNORMAL HIGH (ref 0.1–1.0)
Monocytes Relative: 13 %
Neutro Abs: 10.8 10*3/uL — ABNORMAL HIGH (ref 1.7–7.7)
Neutrophils Relative %: 74 %
Platelets: 230 10*3/uL (ref 150–400)
RBC: 3.72 MIL/uL — ABNORMAL LOW (ref 4.22–5.81)
RDW: 15 % (ref 11.5–15.5)
WBC: 14.5 10*3/uL — ABNORMAL HIGH (ref 4.0–10.5)
nRBC: 0 % (ref 0.0–0.2)

## 2022-07-08 LAB — BASIC METABOLIC PANEL
Anion gap: 7 (ref 5–15)
BUN: 24 mg/dL — ABNORMAL HIGH (ref 8–23)
CO2: 25 mmol/L (ref 22–32)
Calcium: 8 mg/dL — ABNORMAL LOW (ref 8.9–10.3)
Chloride: 101 mmol/L (ref 98–111)
Creatinine, Ser: 1.09 mg/dL (ref 0.61–1.24)
GFR, Estimated: >60 mL/min (ref >60)
Glucose, Bld: 174 mg/dL — ABNORMAL HIGH (ref 70–99)
Potassium: 4 mmol/L (ref 3.5–5.1)
Sodium: 133 mmol/L — ABNORMAL LOW (ref 135–145)

## 2022-07-08 LAB — GLUCOSE, CAPILLARY
Glucose-Capillary: 146 mg/dL — ABNORMAL HIGH (ref 70–99)
Glucose-Capillary: 161 mg/dL — ABNORMAL HIGH (ref 70–99)
Glucose-Capillary: 117 mg/dL — ABNORMAL HIGH (ref 70–99)
Glucose-Capillary: 91 mg/dL (ref 70–99)
Glucose-Capillary: 116 mg/dL — ABNORMAL HIGH (ref 70–99)

## 2022-07-08 NOTE — Progress Notes (Signed)
PROGRESS NOTE  Alejandro Foster  DOB: 21-Feb-1934  PCP: Creola Corn, MD VHQ:[id number]  DOA: 07/06/2022  LOS: 2 days  Hospital Day: 3  Brief narrative: Alejandro Foster is a 89 y.o. male with PMH significant for DM2, HTN, HLD, CAD/stents, DDD, lumbar spine stenosis, radiculopathy, chronic back pain, peripheral neuropathy, nephrolithiasis who lives at home and does not use any assistive devices to ambulate 4/18, patient was brought to the ED by EMS after a fall at home. Per report, patient was working around his car in his driveway when he tripped and fell hitting his left hip and head, sustained immediate left hip pain and was unable to get up.  Denies LOC  X-rays in the ED showed comminuted displaced and angulated intertrochanteric fracture of the left hip Orthopedic surgery was consulted Admitted to [facility]  Subjective: Patient was seen and examined this morning.   Lying on bed.  Not in distress.  Pain controlled.  Was waiting to be seen by PT.  Multiple family members at bedside.   Blood pressure running little low this morning and hence metoprolol and ramipril were held.  Assessment and plan: Comminuted Displaced and Angulated Intertrochanteric Fracture of the Left Hip  S/p intramedullary nailing 4/19 - Dr. Roda Shutters Fracture due to mechanical fall Procedure as above. Pain management with Tylenol scheduled.  Oxycodone as needed, IV Dilaudid as needed  Lovenox for DVT prophylaxis  Essential hypertension PTA on metoprolol titrate 25 mg daily, amlodipine 5 mg daily, HCTZ 25 mg daily, ramipril 20 mg daily Blood pressure low this morning.  Metoprolol held.  Ramipril and HCTZ also remain on hold.  Okay to continue amlodipine for tonight. Continue monitor blood pressure.  Hydralazine IV as needed.   CAD/HLD Recent low risk stress test with Dr. Rosemary Holms PTA on aspirin 81 mg daily, statin and fibrate Aspirin remains on hold.  Plan to resume prior to discharge. Continue statin and fibrate  Type 2  diabetes mellitus A1c 7.1 07/07/2022 PTA on Humalog 75/25 twice daily-35 units AM, 22 units p.m. also on glipizide 2.5 mg twice daily and metformin 5 mg twice daily Currently oral meds on hold. Currently on basal bolus regimen with 15 units Levemir twice daily, scheduled Premeal aspart 4 units 3 times daily and sliding scale insulin.  Blood sugar level controlled. Continue Neurontin for neuropathy Recent Labs  Lab 07/07/22 1704 07/07/22 1853 07/07/22 2139 07/08/22 0750 07/08/22 1153  GLUCAP 185* 194* 229* 146* 161*   Chronic back pain On Cymbalta 30 mg daily  Overactive bladder Vesicare  Insomnia Ambien nightly    Mobility: pending PT eval  Goals of care   Code Status: Full Code     DVT prophylaxis:  enoxaparin (LOVENOX) injection 40 mg Start: 07/08/22 0800 SCDs Start: 07/07/22 1701 Place TED hose Start: 07/07/22 1701 SCDs Start: 07/07/22 0026   Antimicrobials: None Fluid: Currently remains on NS at 50 mill per hour. Consultants: Orthopedics Family Communication: Wife and other family members at bedside  Status: Inpatient Level of care:  Telemetry Medical   Needs to continue in-hospital care:  Postsurgical status.  Pending PT eval  Patient from: Home Anticipated d/c to: Home health with PT versus rehab depending on PT eval   Diet:  Diet Order             Diet Carb Modified Fluid consistency: Thin; Room service appropriate? Yes  Diet effective now  Scheduled Meds:  acetaminophen  1,000 mg Oral Q8H   amLODipine  5 mg Oral QPM   atorvastatin  20 mg Oral QPM   docusate sodium  100 mg Oral BID   DULoxetine  30 mg Oral Daily   enoxaparin (LOVENOX) injection  40 mg Subcutaneous Q24H   fesoterodine  4 mg Oral Daily   gabapentin  100 mg Oral BID   And   gabapentin  200 mg Oral QHS   glipiZIDE  5 mg Oral BID AC   insulin aspart  0-15 Units Subcutaneous TID WC   insulin aspart  0-5 Units Subcutaneous QHS   insulin aspart  4 Units  Subcutaneous TID WC   insulin detemir  15 Units Subcutaneous BID   metFORMIN  1,000 mg Oral BID WC   metoprolol tartrate  25 mg Oral q morning   pantoprazole  40 mg Oral Daily   polyethylene glycol  17 g Oral Daily    PRN meds: acetaminophen, alum & mag hydroxide-simeth, hydrALAZINE, HYDROmorphone (DILAUDID) injection, magnesium citrate, menthol-cetylpyridinium **OR** phenol, methocarbamol **OR** methocarbamol (ROBAXIN) IV, morphine injection, ondansetron **OR** ondansetron (ZOFRAN) IV, oxyCODONE, oxyCODONE, polyethylene glycol, sorbitol, zolpidem   Infusions:   sodium chloride     methocarbamol (ROBAXIN) IV      Antimicrobials: Anti-infectives (From admission, onward)    Start     Dose/Rate Route Frequency Ordered Stop   07/07/22 2100  ceFAZolin (ANCEF) IVPB 2g/100 mL premix        2 g 200 mL/hr over 30 Minutes Intravenous Every 6 hours 07/07/22 1701 07/08/22 0855   07/07/22 0600  ceFAZolin (ANCEF) IVPB 2g/100 mL premix        2 g 200 mL/hr over 30 Minutes Intravenous On call to O.R. 07/06/22 1925 07/07/22 1445       Nutritional status:  Body mass index is 27.06 kg/m.          Objective: Vitals:   07/08/22 0752 07/08/22 0939  BP: (!) 104/57 (!) 115/54  Pulse: 73 66  Resp: 17 19  Temp: 97.7 F (36.5 C)   SpO2: (!) 88% 91%    Intake/Output Summary (Last 24 hours) at 07/08/2022 1500 Last data filed at 07/08/2022 1450 Gross per 24 hour  Intake 2830.48 ml  Output 500 ml  Net 2330.48 ml   There were no vitals filed for this visit. Weight change:  Body mass index is 27.06 kg/m.   Physical Exam: General exam: Pleasant, elderly Caucasian male.  Not in distress.  Pain controlled Skin: No rashes, lesions or ulcers. HEENT: Atraumatic, normocephalic, no obvious bleeding Lungs: Clear to auscultation bilaterally CVS: Regular rate and rhythm, no murmur GI/Abd soft, nontender, nondistended, bowel sound present CNS: Alert awake oriented x 3 Psychiatry: Mood  appropriate Extremities: No pedal edema, no calf tenderness  Data Review: I have personally reviewed the laboratory data and studies available.  F/u labs ordered Unresulted Labs (From admission, onward)     Start     Ordered   07/14/22 0500  Creatinine, serum  (enoxaparin (LOVENOX)  CrCl >/= 30 mL/min  )  Weekly,   R     Comments: while on enoxaparin therapy.    07/07/22 1701   07/08/22 0500  CBC  Daily,   R     Comments: For 3 days.    07/07/22 1701   07/08/22 0500  Basic metabolic panel  Daily,   R     Comments: For 2 days .    07/07/22 1701  Total time spent in review of labs and imaging, patient evaluation, formulation of plan, documentation and communication with family: 45 minutes  Signed, Lorin Glass, MD [facility] 07/08/2022

## 2022-07-08 NOTE — Evaluation (Signed)
Physical Therapy Evaluation Patient Details Name: Alejandro Foster MRN: [id number] DOB: February 10, 1934 Today's Date: 07/08/2022  History of Present Illness  89 y.o. male admitted 4/18 s/p Lt hip fx and had INTRAMEDULLARY (IM) NAILING OF LEFT FEMUR with PMH significant for DM2, HTN, HLD, CAD/stents, DDD, lumbar spine stenosis, radiculopathy, chronic back pain, peripheral neuropathy, nephrolithiasis. Pt fell while loading his trailer.  Clinical Impression  Patient is s/p above surgery resulting in functional limitations due to the deficits listed below (see PT Problem List). Previously active, independent and driving. Lives with wife. Family present during evaluation and very supportive. Mod assist for transfer from recliner and min assist to ambulate short distance in room with training for RW use. Distance of 10 feet limited by onset of nausea after having pain medication shortly before PT arrival. Would benefit from post-acute rehab to improve independence prior to returning home with wife. Patient will benefit from acute skilled PT to increase their independence and safety with mobility to facilitate discharge.     Patient will benefit from intensive inpatient follow up therapy, >3 hours/day    Recommendations for follow up therapy are one component of a multi-disciplinary discharge planning process, led by the attending physician.  Recommendations may be updated based on patient status, additional functional criteria and insurance authorization.     Assistance Recommended at Discharge Frequent or constant Supervision/Assistance  Patient can return home with the following  A lot of help with walking and/or transfers;A little help with bathing/dressing/bathroom;Assistance with cooking/housework;Assist for transportation;Help with stairs or ramp for entrance    Equipment Recommendations None recommended by PT (TBD next venue)  Recommendations for Other Services  Rehab consult    Functional Status  Assessment Patient has had a recent decline in their functional status and demonstrates the ability to make significant improvements in function in a reasonable and predictable amount of time.     Precautions / Restrictions Precautions Precautions: Fall Restrictions Weight Bearing Restrictions: Yes LLE Weight Bearing: Weight bearing as tolerated      Mobility  Bed Mobility               General bed mobility comments: in recliner    Transfers Overall transfer level: Needs assistance Equipment used: Rolling walker (2 wheels) Transfers: Sit to/from Stand Sit to Stand: Mod assist           General transfer comment: Mod assist for boost to stand, nearing Min A. Cues for hand placement and technique. RW for support upon rising to stabilize and unload weight on Lt as needed.    Ambulation/Gait Ambulation/Gait assistance: Min assist Gait Distance (Feet): 10 Feet Assistive device: Rolling walker (2 wheels) Gait Pattern/deviations: Step-to pattern, Decreased stride length, Decreased stance time - left, Decreased weight shift to left, Antalgic Gait velocity: decr Gait velocity interpretation: <1.31 ft/sec, indicative of household ambulator Pre-gait activities: Weight shift, march General Gait Details: Educated on safe AD use with RW. VC for sequencing, min assist for RW control and progress with weight shifting. Step-to pattern. Pt navigated 5 feet forward, became nauseous, and tolerated 5 feet backwards with additional cues for sequencing. Some difficulty lifting LLE adequately from floor for clearance but improved gradually. No buckling, using UE support adequately to unload LLE as needed.  Stairs            Wheelchair Mobility    Modified Rankin (Stroke Patients Only)       Balance Overall balance assessment: Needs assistance Sitting-balance support: No upper extremity supported, Feet supported  Sitting balance-Leahy Scale: Fair     Standing balance support:  Bilateral upper extremity supported, Reliant on assistive device for balance Standing balance-Leahy Scale: Poor                               Pertinent Vitals/Pain Pain Assessment Pain Assessment: Faces Faces Pain Scale: Hurts even more Pain Location: l hip with WB Pain Descriptors / Indicators: Aching Pain Intervention(s): Limited activity within patient's tolerance, Monitored during session, Premedicated before session, Repositioned    Home Living Family/patient expects to be discharged to:: Private residence Living Arrangements: Spouse/significant other Available Help at Discharge: Available 24 hours/day Type of Home: House Home Access: Stairs to enter Entrance Stairs-Rails: Right;Left (can not reach both) Entrance Stairs-Number of Steps: 3   Home Layout: Able to live on main level with bedroom/bathroom (pnt has his office and bathroom in basement;) Home Equipment: None Additional Comments: the shower in basement is more accessable but there is 14 steps down.    Prior Function Prior Level of Function : Independent/Modified Independent;Driving             Mobility Comments: i ADLs Comments: i with adls.     Hand Dominance   Dominant Hand: Right    Extremity/Trunk Assessment   Upper Extremity Assessment Upper Extremity Assessment: Defer to OT evaluation    Lower Extremity Assessment Lower Extremity Assessment: LLE deficits/detail LLE Deficits / Details: weakness and guarding as anticipated post op       Communication   Communication: No difficulties  Cognition Arousal/Alertness: Awake/alert Behavior During Therapy: WFL for tasks assessed/performed Overall Cognitive Status: Within Functional Limits for tasks assessed                                          General Comments General comments (skin integrity, edema, etc.): family present, supportive    Exercises General Exercises - Lower Extremity Ankle Circles/Pumps: AROM,  Both, 10 reps, Seated Quad Sets: Strengthening, Both, 10 reps, Seated Gluteal Sets: Strengthening, Both, 10 reps, Seated Hip ABduction/ADduction: Strengthening, Both, 5 reps, Seated   Assessment/Plan    PT Assessment Patient needs continued PT services  PT Problem List Decreased strength;Decreased range of motion;Decreased activity tolerance;Decreased balance;Decreased mobility;Decreased knowledge of use of DME;Decreased knowledge of precautions;Pain       PT Treatment Interventions DME instruction;Gait training;Stair training;Functional mobility training;Therapeutic activities;Therapeutic exercise;Neuromuscular re-education;Balance training;Patient/family education;Modalities    PT Goals (Current goals can be found in the Care Plan section)  Acute Rehab PT Goals Patient Stated Goal: Get stronger at rehab, then go home PT Goal Formulation: With patient Time For Goal Achievement: 07/21/22 Potential to Achieve Goals: Good    Frequency Min 5X/week     Co-evaluation               AM-PAC PT "6 Clicks" Mobility  Outcome Measure Help needed turning from your back to your side while in a flat bed without using bedrails?: A Little Help needed moving from lying on your back to sitting on the side of a flat bed without using bedrails?: A Little Help needed moving to and from a bed to a chair (including a wheelchair)?: A Lot Help needed standing up from a chair using your arms (e.g., wheelchair or bedside chair)?: A Lot Help needed to walk in hospital room?: A Little Help needed climbing 3-5  steps with a railing? : A Lot 6 Click Score: 15    End of Session Equipment Utilized During Treatment: Gait belt Activity Tolerance: Patient tolerated treatment well;Other (comment) (gait limited by nausea) Patient left: in chair;with call bell/phone within reach;with chair alarm set;with family/visitor present;with SCD's reapplied   PT Visit Diagnosis: Other abnormalities of gait and mobility  (R26.89);Unsteadiness on feet (R26.81);Muscle weakness (generalized) (M62.81);History of falling (Z91.81);Difficulty in walking, not elsewhere classified (R26.2);Pain Pain - Right/Left: Left Pain - part of body: Hip    Time: 1320-1340 PT Time Calculation (min) (ACUTE ONLY): 20 min   Charges:   PT Evaluation $PT Eval Low Complexity: 1 Low          Kathlyn Sacramento, PT, DPT Physical Therapist Acute Rehabilitation Services [facility] Outpatient Rehabilitation Services [facility]   Berton Mount 07/08/2022, 3:37 PM

## 2022-07-08 NOTE — Progress Notes (Signed)
   Subjective:  Patient reports pain as moderate.   No events.  Objective:   VITALS:   Vitals:   07/07/22 2056 07/08/22 0357 07/08/22 0752 07/08/22 0939  BP: 135/75 129/60 (!) 104/57 (!) 115/54  Pulse: 78 77 73 66  Resp: Temp: 98.1 F (36.7 C) 98.3 F (36.8 C) 97.7 F (36.5 C)   TempSrc: Oral Oral Oral   SpO2: 93% 93% (!) 88% 91%  Height:        Sensation intact distally Intact pulses distally Dorsiflexion/Plantar flexion intact Incision: dressing C/D/I and no drainage   Lab Results  Component Value Date   WBC 14.5 (H) 07/08/2022   HGB 10.8 (L) 07/08/2022   HCT 32.8 (L) 07/08/2022   MCV 88.2 07/08/2022   PLT 230 07/08/2022     Assessment/Plan:  1 Day Post-Op   - Expected postop acute blood loss anemia - Up with PT/OT - DVT ppx - SCDs, ambulation, lovenox - WBAT operative extremity - Pain control   Alejandro Foster 07/08/2022, 10:40 AM

## 2022-07-08 NOTE — Evaluation (Signed)
Occupational Therapy Evaluation Patient Details Name: Alejandro Foster MRN: [id number] DOB: 04-28-1933 Today's Date: 07/08/2022   History of Present Illness 89 y.o. male with PMH significant for DM2, HTN, HLD, CAD/stents, DDD, lumbar spine stenosis, radiculopathy, chronic back pain, peripheral neuropathy, nephrolithiasis. Pt fell while loading his trailer. Pt. has l hip fx and had NTRAMEDULLARY (IM) NAILING OF LEFT FEMUR   Clinical Impression   Pt was cooperative with therapy. Pt required assist with supine to sit at Mod a level. Pt. Was Mod a with sit to stand from bed with raised bed. Pt. Is Mod a with stand pivot to chair. Pt. Is open to snf if needed. He has 3 steps to get into his home.      Recommendations for follow up therapy are one component of a multi-disciplinary discharge planning process, led by the attending physician.  Recommendations may be updated based on patient status, additional functional criteria and insurance authorization.   Assistance Recommended at Discharge Frequent or constant Supervision/Assistance  Patient can return home with the following A lot of help with bathing/dressing/bathroom;A lot of help with walking and/or transfers;Assistance with cooking/housework    Functional Status Assessment  Patient has had a recent decline in their functional status and demonstrates the ability to make significant improvements in function in a reasonable and predictable amount of time.  Equipment Recommendations  BSC/3in1 (pt. does not think a shower seat will fit in the walk in shower.)    Recommendations for Other Services       Precautions / Restrictions Precautions Precautions: Fall Restrictions Weight Bearing Restrictions: Yes LLE Weight Bearing: Weight bearing as tolerated      Mobility Bed Mobility Overal bed mobility: Needs Assistance Bed Mobility: Supine to Sit     Supine to sit: Mod assist     General bed mobility comments: cues for proper hand  placement    Transfers Overall transfer level: Needs assistance Equipment used: Rolling walker (2 wheels) Transfers: Sit to/from Stand, Bed to chair/wheelchair/BSC Sit to Stand: Mod assist Stand pivot transfers: Mod assist         General transfer comment: cues and assist to move walker      Balance Overall balance assessment: Needs assistance   Sitting balance-Leahy Scale: Fair       Standing balance-Leahy Scale: Poor                             ADL either performed or assessed with clinical judgement   ADL Overall ADL's : Needs assistance/impaired Eating/Feeding: Independent   Grooming: Wash/dry hands;Wash/dry face;Set up;Sitting   Upper Body Bathing: Minimal assistance;Sitting   Lower Body Bathing: Maximal assistance;Sit to/from stand   Upper Body Dressing : Minimal assistance;Sitting   Lower Body Dressing: Total assistance;Sit to/from stand   Toilet Transfer: Moderate assistance;Stand-pivot   Toileting- Clothing Manipulation and Hygiene: Maximal assistance;Sit to/from stand       Functional mobility during ADLs: Moderate assistance;Rolling walker (2 wheels) (Pt. performed stand pivot transfer bed to recliner.) General ADL Comments: pt may benefit from ae training.     Vision Baseline Vision/History: 1 Wears glasses Ability to See in Adequate Light: 0 Adequate Patient Visual Report: No change from baseline Vision Assessment?: No apparent visual deficits     Perception     Praxis      Pertinent Vitals/Pain Pain Assessment Pain Assessment: 0-10 Pain Score: 4  Pain Location: l hip Pain Descriptors / Indicators: Aching Pain Intervention(s):  Patient requesting pain meds-RN notified     Hand Dominance Right   Extremity/Trunk Assessment Upper Extremity Assessment Upper Extremity Assessment: Overall WFL for tasks assessed           Communication Communication Communication: No difficulties   Cognition Arousal/Alertness:  Awake/alert Behavior During Therapy: WFL for tasks assessed/performed Overall Cognitive Status: Within Functional Limits for tasks assessed                                       General Comments       Exercises     Shoulder Instructions      Home Living Family/patient expects to be discharged to:: Private residence Living Arrangements: Spouse/significant other Available Help at Discharge: Available 24 hours/day Type of Home: House Home Access: Stairs to enter [employer] of Steps: 3 Entrance Stairs-Rails: Right;Left (can not reach both) Home Layout: Able to live on main level with bedroom/bathroom (pnt has his office and bathroom in basement.)     Bathroom Shower/Tub: Producer, television/film/video: Handicapped height Bathroom Accessibility:  (narrow doors)   Home Equipment: None   Additional Comments: the shower in basement is more accessable but there is 14 steps down.      Prior Functioning/Environment Prior Level of Function : Independent/Modified Independent;Driving             Mobility Comments: i ADLs Comments: i with adls.        OT Problem List: Decreased activity tolerance;Impaired balance (sitting and/or standing);Decreased knowledge of use of DME or AE      OT Treatment/Interventions: Self-care/ADL training;Therapeutic activities;Patient/family education;DME and/or AE instruction    OT Goals(Current goals can be found in the care plan section) Acute Rehab OT Goals Patient Stated Goal: to be able to walk OT Goal Formulation: With patient Time For Goal Achievement: 07/22/22 Potential to Achieve Goals: Good ADL Goals Pt Will Perform Lower Body Bathing: with min assist;sit to/from stand Pt Will Perform Lower Body Dressing: with min assist;with adaptive equipment;sit to/from stand Pt Will Transfer to Toilet: with min assist;ambulating;bedside commode Pt Will Perform Toileting - Clothing Manipulation and hygiene: with  min assist;sit to/from stand  OT Frequency: Min 2X/week    Co-evaluation              AM-PAC OT "6 Clicks" Daily Activity     Outcome Measure Help from another person eating meals?: None Help from another person taking care of personal grooming?: A Little Help from another person toileting, which includes using toliet, bedpan, or urinal?: A Lot Help from another person bathing (including washing, rinsing, drying)?: A Lot Help from another person to put on and taking off regular upper body clothing?: A Little Help from another person to put on and taking off regular lower body clothing?: Total 6 Click Score: 15   End of Session Equipment Utilized During Treatment: Rolling walker (2 wheels) Nurse Communication:  (ok therapy)  Activity Tolerance: Patient tolerated treatment well Patient left: in chair;with call bell/phone within reach;with family/visitor present  OT Visit Diagnosis: Unsteadiness on feet (R26.81);Pain                Time: 1610-9604 OT Time Calculation (min): 34 min Charges:  OT General Charges $OT Visit: 1 Visit OT Evaluation $OT Eval Moderate Complexity: 1 Mod  Derrek Gu OT/L   Kaliegh Willadsen 07/08/2022, 12:57 PM

## 2022-07-09 DIAGNOSIS — S72142A Displaced intertrochanteric fracture of left femur, initial encounter for closed fracture: Secondary | ICD-10-CM | POA: Diagnosis not present

## 2022-07-09 LAB — CBC
HCT: 29.2 % — ABNORMAL LOW (ref 39.0–52.0)
Hemoglobin: 9.8 g/dL — ABNORMAL LOW (ref 13.0–17.0)
MCH: 29.3 pg (ref 26.0–34.0)
MCHC: 33.6 g/dL (ref 30.0–36.0)
MCV: 87.4 fL (ref 80.0–100.0)
Platelets: 199 10*3/uL (ref 150–400)
RBC: 3.34 MIL/uL — ABNORMAL LOW (ref 4.22–5.81)
RDW: 14.9 % (ref 11.5–15.5)
WBC: 11.4 10*3/uL — ABNORMAL HIGH (ref 4.0–10.5)
nRBC: 0 % (ref 0.0–0.2)

## 2022-07-09 LAB — GLUCOSE, CAPILLARY
Glucose-Capillary: 87 mg/dL (ref 70–99)
Glucose-Capillary: 169 mg/dL — ABNORMAL HIGH (ref 70–99)
Glucose-Capillary: 258 mg/dL — ABNORMAL HIGH (ref 70–99)
Glucose-Capillary: 121 mg/dL — ABNORMAL HIGH (ref 70–99)
Glucose-Capillary: 128 mg/dL — ABNORMAL HIGH (ref 70–99)
Glucose-Capillary: 127 mg/dL — ABNORMAL HIGH (ref 70–99)

## 2022-07-09 LAB — BASIC METABOLIC PANEL
Anion gap: 9 (ref 5–15)
BUN: 20 mg/dL (ref 8–23)
CO2: 25 mmol/L (ref 22–32)
Calcium: 8.1 mg/dL — ABNORMAL LOW (ref 8.9–10.3)
Chloride: 102 mmol/L (ref 98–111)
Creatinine, Ser: 0.91 mg/dL (ref 0.61–1.24)
GFR, Estimated: >60 mL/min (ref >60)
Glucose, Bld: 95 mg/dL (ref 70–99)
Potassium: 3.3 mmol/L — ABNORMAL LOW (ref 3.5–5.1)
Sodium: 136 mmol/L (ref 135–145)

## 2022-07-09 MED ORDER — ALUM & MAG HYDROXIDE-SIMETH 200-200-20 MG/5ML PO SUSP
30.0000 mL | Freq: Three times a day (TID) | ORAL | Status: DC
  Administered 2022-07-09 – 2022-07-12 (×8): 30 mL via ORAL
  Filled 2022-07-09 (×8): qty 30

## 2022-07-09 MED ORDER — POTASSIUM CHLORIDE CRYS ER 20 MEQ PO TBCR
40.0000 meq | EXTENDED_RELEASE_TABLET | Freq: Once | ORAL | Status: AC
  Administered 2022-07-09: 40 meq via ORAL
  Filled 2022-07-09: qty 2

## 2022-07-09 NOTE — Progress Notes (Signed)
PROGRESS NOTE  Alejandro Foster  DOB: 1933/05/25  PCP: Creola Corn, MD ZOX:[id number]  DOA: 07/06/2022  LOS: 3 days  Hospital Day: 4  Brief narrative: Alejandro Foster is a 89 y.o. male with PMH significant for DM2, HTN, HLD, CAD/stents, DDD, lumbar spine stenosis, radiculopathy, chronic back pain, peripheral neuropathy, nephrolithiasis who lives at home and does not use any assistive devices to ambulate 4/18, patient was brought to the ED by EMS after a fall at home. Per report, patient was working around his car in his driveway when he tripped and fell hitting his left hip and head, sustained immediate left hip pain and was unable to get up.  Denies LOC  X-rays in the ED showed comminuted displaced and angulated intertrochanteric fracture of the left hip Orthopedic surgery was consulted Admitted to [facility]  Subjective: Patient was seen and examined this morning.   Lying on bed.  Complains of persistent heartburn.  Feels like stuck sensation when he swallows. Wife at bedside.  Assessment and plan: Comminuted Displaced and Angulated Intertrochanteric Fracture of the Left Hip  S/p intramedullary nailing 4/19 - Dr. Roda Shutters Fracture due to mechanical fall Procedure as above. Pain management with Tylenol scheduled.  Oxycodone as needed, IV Dilaudid as needed  Lovenox for DVT prophylaxis PT eval obtained.  CIR recommended.  Heartburn Food stuck sensation PTA, patient was on Pepcid for heartburn.  His symptoms has gotten worse while in the hospital. On Protonix schedule as well as PRN Mylanta.  I scheduled Mylanta this morning. Given his symptoms of food stuck sensation and no endoscopic evaluation in the last several years, he may benefit from EGD well.  Will discuss with GI tomorrow.  Inpatient versus outpatient depending on availability As needed Zofran for nausea  Essential hypertension PTA on metoprolol titrate 25 mg daily, amlodipine 5 mg daily, HCTZ 25 mg daily, ramipril 20 mg daily Blood  pressure remains controlled on metoprolol 25 mg daily and amlodipine 5 mg nightly.  Ramipril and HCTZ remain on hold Continue monitor blood pressure.  Hydralazine IV as needed.   CAD/HLD Recent low risk stress test with Dr. Rosemary Holms PTA on aspirin 81 mg daily, statin and fibrate Aspirin remains on hold.  Plan to resume prior to discharge. Continue statin and fibrate  Type 2 diabetes mellitus A1c 7.1 07/07/2022 PTA on Humalog 75/25 twice daily-35 units AM, 22 units p.m. also on glipizide 2.5 mg twice daily and metformin 500 mg twice daily. Currently on basal bolus regimen with 15 units Levemir twice daily scheduled, Premeal aspart 4 units 3 times daily and sliding scale insulin.  Also continued on glipizide and metformin.  Blood sugar level mostly running low.  I will stop Premeal scheduled insulin today. Continue Neurontin for neuropathy Recent Labs  Lab 07/08/22 1605 07/08/22 2202 07/08/22 2320 07/09/22 0530 07/09/22 0952  GLUCAP 117* 91 116* 87 169*    Chronic back pain On Cymbalta 30 mg daily  Overactive bladder Vesicare  Insomnia Ambien nightly  Goals of care   Code Status: Full Code     DVT prophylaxis:  enoxaparin (LOVENOX) injection 40 mg Start: 07/08/22 0800 SCDs Start: 07/07/22 1701 Place TED hose Start: 07/07/22 1701 SCDs Start: 07/07/22 0026   Antimicrobials: None Fluid: Stop IV hydration Consultants: Orthopedics Family Communication: Wife and other family members at bedside  Status: Inpatient Level of care:  Telemetry Medical   Needs to continue in-hospital care:  Pending CIR.  May also need EGD  Patient from: Home Anticipated d/c to:  Pending CIR   Diet:  Diet Order             Diet Carb Modified Fluid consistency: Thin; Room service appropriate? Yes  Diet effective now                   Scheduled Meds:  acetaminophen  1,000 mg Oral Q8H   alum & mag hydroxide-simeth  30 mL Oral TID AC   amLODipine  5 mg Oral QPM   atorvastatin   20 mg Oral QPM   docusate sodium  100 mg Oral BID   DULoxetine  30 mg Oral Daily   enoxaparin (LOVENOX) injection  40 mg Subcutaneous Q24H   fesoterodine  4 mg Oral Daily   gabapentin  100 mg Oral BID   And   gabapentin  200 mg Oral QHS   glipiZIDE  5 mg Oral BID AC   insulin aspart  0-15 Units Subcutaneous TID WC   insulin aspart  0-5 Units Subcutaneous QHS   insulin detemir  15 Units Subcutaneous BID   metFORMIN  1,000 mg Oral BID WC   metoprolol tartrate  25 mg Oral q morning   pantoprazole  40 mg Oral Daily   polyethylene glycol  17 g Oral Daily    PRN meds: acetaminophen, hydrALAZINE, HYDROmorphone (DILAUDID) injection, magnesium citrate, menthol-cetylpyridinium **OR** phenol, methocarbamol **OR** methocarbamol (ROBAXIN) IV, morphine injection, ondansetron **OR** ondansetron (ZOFRAN) IV, oxyCODONE, oxyCODONE, polyethylene glycol, sorbitol, zolpidem   Infusions:   methocarbamol (ROBAXIN) IV      Antimicrobials: Anti-infectives (From admission, onward)    Start     Dose/Rate Route Frequency Ordered Stop   07/07/22 2100  ceFAZolin (ANCEF) IVPB 2g/100 mL premix        2 g 200 mL/hr over 30 Minutes Intravenous Every 6 hours 07/07/22 1701 07/08/22 0855   07/07/22 0600  ceFAZolin (ANCEF) IVPB 2g/100 mL premix        2 g 200 mL/hr over 30 Minutes Intravenous On call to O.R. 07/06/22 1925 07/07/22 1445       Nutritional status:  Body mass index is 27.06 kg/m.          Objective: Vitals:   07/09/22 0531 07/09/22 0759  BP: 124/60 127/67  Pulse: 81 76  Resp: 20 18  Temp: 98.2 F (36.8 C) 98.2 F (36.8 C)  SpO2: 94% 96%    Intake/Output Summary (Last 24 hours) at 07/09/2022 1135 Last data filed at 07/09/2022 1610 Gross per 24 hour  Intake 1705.2 ml  Output 1000 ml  Net 705.2 ml    There were no vitals filed for this visit. Weight change:  Body mass index is 27.06 kg/m.   Physical Exam: General exam: Pleasant, elderly Caucasian male.  Not in distress.   Pain controlled Skin: No rashes, lesions or ulcers. HEENT: Atraumatic, normocephalic, no obvious bleeding Lungs: Clear to auscultation bilaterally CVS: Regular rate and rhythm, no murmur GI/Abd soft, epigastric tenderness present.  Nontender, nondistended, bowel sound present CNS: Alert awake oriented x 3 Psychiatry: Mood appropriate Extremities: No pedal edema, no calf tenderness  Data Review: I have personally reviewed the laboratory data and studies available.  F/u labs ordered Unresulted Labs (From admission, onward)     Start     Ordered   07/14/22 0500  Creatinine, serum  (enoxaparin (LOVENOX)  CrCl >/= 30 mL/min  )  Weekly,   R     Comments: while on enoxaparin therapy.    07/07/22 1701   07/08/22 0500  CBC  Daily,   R     Comments: For 3 days.    07/07/22 1701            Total time spent in review of labs and imaging, patient evaluation, formulation of plan, documentation and communication with family: 45 minutes  Signed, Lorin Glass, MD [facility] 07/09/2022

## 2022-07-09 NOTE — Progress Notes (Signed)
Inpatient Rehab Admissions:  Inpatient Rehab Consult received.  I met with patient at the bedside for rehabilitation assessment and to discuss goals and expectations of an inpatient rehab admission.  Discussed average length of stay, insurance authorization requirement, discharge home after completion of CIR. Pt acknowledged understanding. Pt interested in pursuing CIR. Pt gave permission to contact wife. Pt's wife Eber Jones called AC. She also acknowledged understanding of CIR goals and expectations. She confirmed that she would be available to provide support for pt after discharge. She would like to discuss rehab options with pt and a physical therapist before making a decision regarding CIR. Will continue to follow.  Signed: Wolfgang Phoenix, MS, CCC-SLP Admissions Coordinator [phone]

## 2022-07-10 ENCOUNTER — Inpatient Hospital Stay (HOSPITAL_COMMUNITY): Payer: Medicare Other

## 2022-07-10 ENCOUNTER — Ambulatory Visit: Payer: Medicare Other | Admitting: Cardiology

## 2022-07-10 ENCOUNTER — Encounter (HOSPITAL_COMMUNITY): Payer: Self-pay | Admitting: Orthopaedic Surgery

## 2022-07-10 DIAGNOSIS — S72142A Displaced intertrochanteric fracture of left femur, initial encounter for closed fracture: Secondary | ICD-10-CM | POA: Diagnosis not present

## 2022-07-10 LAB — GLUCOSE, CAPILLARY
Glucose-Capillary: 129 mg/dL — ABNORMAL HIGH (ref 70–99)
Glucose-Capillary: 152 mg/dL — ABNORMAL HIGH (ref 70–99)
Glucose-Capillary: 195 mg/dL — ABNORMAL HIGH (ref 70–99)
Glucose-Capillary: 207 mg/dL — ABNORMAL HIGH (ref 70–99)
Glucose-Capillary: 195 mg/dL — ABNORMAL HIGH (ref 70–99)

## 2022-07-10 MED ORDER — HYDROCHLOROTHIAZIDE 25 MG PO TABS
25.0000 mg | ORAL_TABLET | Freq: Every morning | ORAL | Status: DC
  Administered 2022-07-10 – 2022-07-18 (×9): 25 mg via ORAL
  Filled 2022-07-10 (×9): qty 1

## 2022-07-10 MED ORDER — RAMIPRIL 5 MG PO CAPS
20.0000 mg | ORAL_CAPSULE | Freq: Every day | ORAL | Status: DC
  Administered 2022-07-10 – 2022-07-18 (×9): 20 mg via ORAL
  Filled 2022-07-10 (×10): qty 4

## 2022-07-10 MED ORDER — INSULIN DETEMIR 100 UNIT/ML ~~LOC~~ SOLN
5.0000 [IU] | Freq: Two times a day (BID) | SUBCUTANEOUS | Status: DC
  Administered 2022-07-10 – 2022-07-11 (×2): 5 [IU] via SUBCUTANEOUS
  Filled 2022-07-10 (×4): qty 0.05

## 2022-07-10 NOTE — Progress Notes (Signed)
Occupational Therapy Treatment Patient Details Name: Alejandro Foster MRN: [id number] DOB: 08/19/1933 Today's Date: 07/10/2022   History of present illness 89 y.o. male admitted 4/18 s/p Lt hip fx and had INTRAMEDULLARY (IM) NAILING OF LEFT FEMUR with PMH significant for DM2, HTN, HLD, CAD/stents, DDD, lumbar spine stenosis, radiculopathy, chronic back pain, peripheral neuropathy, nephrolithiasis. Pt fell while loading his trailer.   OT comments  Pt making steady progress towards OT goals, eager to progress from bedpan to [facility] for toileting tasks. Pt able to progress transfers w/ RW to Min A with improving WB tolerance. Pt also able to assist w/ LB ADLs though does require Mod-Max A for tasks assessed today. Based on high PLOF and motivation, feel pt would progress well with intensive rehab services at DC.   Recommendations for follow up therapy are one component of a multi-disciplinary discharge planning process, led by the attending physician.  Recommendations may be updated based on patient status, additional functional criteria and insurance authorization.    Assistance Recommended at Discharge Intermittent Supervision/Assistance  Patient can return home with the following  A little help with walking and/or transfers;A lot of help with bathing/dressing/bathroom;Assistance with cooking/housework;Assist for transportation;Help with stairs or ramp for entrance   Equipment Recommendations  BSC/3in1;Other (comment) (RW)    Recommendations for Other Services Rehab consult    Precautions / Restrictions Precautions Precautions: Fall Restrictions Weight Bearing Restrictions: Yes LLE Weight Bearing: Weight bearing as tolerated       Mobility Bed Mobility Overal bed mobility: Needs Assistance Bed Mobility: Supine to Sit     Supine to sit: Min assist, HOB elevated     General bed mobility comments: assist for LLE advancement to EOB    Transfers Overall transfer level: Needs  assistance Equipment used: Rolling walker (2 wheels) Transfers: Sit to/from Stand, Bed to chair/wheelchair/BSC Sit to Stand: Mod assist, Min assist     Step pivot transfers: Min assist     General transfer comment: Initially Mod A to stand from bedside, progressing to Min A standing from [facility] pushing from armrests. increased distance to pivot from [facility] to recliner to further progress WB tolerance     Balance Overall balance assessment: Needs assistance Sitting-balance support: No upper extremity supported, Feet supported Sitting balance-Leahy Scale: Fair     Standing balance support: Bilateral upper extremity supported, Reliant on assistive device for balance Standing balance-Leahy Scale: Fair Standing balance comment: able to briefly stand statically without UE support                           ADL either performed or assessed with clinical judgement   ADL Overall ADL's : Needs assistance/impaired                     Lower Body Dressing: Maximal assistance;Sit to/from stand   Toilet Transfer: Minimal assistance;Stand-pivot;Rolling walker (2 wheels);BSC/3in1 Toilet Transfer Details (indicate cue type and reason): Min A for RW negotiation to/from [facility] w/ improving carryover after sequencing education Toileting- Clothing Manipulation and Hygiene: Moderate assistance;Sitting/lateral lean;Sit to/from stand Toileting - Clothing Manipulation Details (indicate cue type and reason): able to assist with clothing mgmt though unable to reach posterior region on [facility] - OT assisted with hygiene in standing       General ADL Comments: Improving tolerance for WB; able to progress to [facility] (which pt reports eager to do and better than bedpan)    Extremity/Trunk Assessment Upper Extremity Assessment Upper Extremity  Assessment: Overall WFL for tasks assessed   Lower Extremity Assessment Lower Extremity Assessment: Defer to PT evaluation        Vision   Vision Assessment?:  No apparent visual deficits   Perception     Praxis      Cognition Arousal/Alertness: Awake/alert Behavior During Therapy: WFL for tasks assessed/performed Overall Cognitive Status: Within Functional Limits for tasks assessed                                          Exercises      Shoulder Instructions       General Comments      Pertinent Vitals/ Pain       Pain Assessment Pain Assessment: Faces Faces Pain Scale: Hurts little more Pain Location: L hip with knee flexion Pain Descriptors / Indicators: Aching Pain Intervention(s): Monitored during session, Limited activity within patient's tolerance, Ice applied  Home Living                                          Prior Functioning/Environment              Frequency  Min 2X/week        Progress Toward Goals  OT Goals(current goals can now be found in the care plan section)  Progress towards OT goals: Progressing toward goals  Acute Rehab OT Goals Patient Stated Goal: return to independence, get back to travelling OT Goal Formulation: With patient Time For Goal Achievement: 07/22/22 Potential to Achieve Goals: Good ADL Goals Pt Will Perform Lower Body Bathing: with min assist;sit to/from stand Pt Will Perform Lower Body Dressing: with min assist;with adaptive equipment;sit to/from stand Pt Will Transfer to Toilet: with min assist;ambulating;bedside commode Pt Will Perform Toileting - Clothing Manipulation and hygiene: with min assist;sit to/from stand  Plan Discharge plan needs to be updated    Co-evaluation                 AM-PAC OT "6 Clicks" Daily Activity     Outcome Measure   Help from another person eating meals?: None Help from another person taking care of personal grooming?: A Little Help from another person toileting, which includes using toliet, bedpan, or urinal?: A Lot Help from another person bathing (including washing, rinsing, drying)?: A  Lot Help from another person to put on and taking off regular upper body clothing?: A Little Help from another person to put on and taking off regular lower body clothing?: A Lot 6 Click Score: 16    End of Session Equipment Utilized During Treatment: Rolling walker (2 wheels)  OT Visit Diagnosis: Unsteadiness on feet (R26.81);Pain   Activity Tolerance Patient tolerated treatment well   Patient Left in chair;with call bell/phone within reach;with chair alarm set   Nurse Communication Mobility status        Time: 6045-4098 OT Time Calculation (min): 36 min  Charges: OT General Charges $OT Visit: 1 Visit OT Treatments $Self Care/Home Management : 23-37 mins  Bradd Canary, OTR/L Acute Rehab Services Office: [phone]   Lorre Munroe 07/10/2022, 9:03 AM

## 2022-07-10 NOTE — Care Management Important Message (Signed)
Important Message  Patient Details  Name: Alejandro Foster MRN: [id number] Date of Birth: Apr 22, 1933   Medicare Important Message Given:  Yes     Sherilyn Banker 07/10/2022, 1:13 PM

## 2022-07-10 NOTE — Anesthesia Preprocedure Evaluation (Signed)
Anesthesia Evaluation  Patient identified by MRN, date of birth, ID band Patient awake    Reviewed: Allergy & Precautions, H&P , NPO status , Patient's Chart, lab work & pertinent test results, reviewed documented beta blocker date and time   History of Anesthesia Complications Negative for: history of anesthetic complications  Airway Mallampati: II  TM Distance: >3 FB Neck ROM: Full    Dental  (+) Dental Advisory Given, Implants, Teeth Intact   Pulmonary neg pulmonary ROS, former smoker   Pulmonary exam normal        Cardiovascular hypertension, Pt. on medications and Pt. on home beta blockers + angina  + CAD, + Cardiac Stents and + Peripheral Vascular Disease  negative cardio ROS Normal cardiovascular exam     Neuro/Psych  Hearing aids   Neuromuscular disease negative neurological ROS  negative psych ROS   GI/Hepatic negative GI ROS, Neg liver ROS,GERD  Medicated,,  Endo/Other  negative endocrine ROSdiabetes, Type 2, Oral Hypoglycemic Agents, Insulin Dependent    Renal/GU Renal diseasenegative Renal ROS  negative genitourinary   Musculoskeletal negative musculoskeletal ROS (+) Arthritis ,    Abdominal   Peds negative pediatric ROS (+)  Hematology negative hematology ROS (+) Blood dyscrasia, anemia   Anesthesia Other Findings   Reproductive/Obstetrics negative OB ROS                             Anesthesia Physical Anesthesia Plan  ASA: 3  Anesthesia Plan: MAC   Post-op Pain Management: Minimal or no pain anticipated   Induction: Intravenous  PONV Risk Score and Plan: 2 and Treatment may vary due to age or medical condition and Propofol infusion  Airway Management Planned: Natural Airway, Simple Face Mask and Mask  Additional Equipment: None  Intra-op Plan:   Post-operative Plan:   Informed Consent: I have reviewed the patients History and Physical, chart, labs and  discussed the procedure including the risks, benefits and alternatives for the proposed anesthesia with the patient or authorized representative who has indicated his/her understanding and acceptance.     Dental advisory given  Plan Discussed with: CRNA and Anesthesiologist  Anesthesia Plan Comments:        Anesthesia Quick Evaluation

## 2022-07-10 NOTE — Progress Notes (Signed)
PROGRESS NOTE  Alejandro Foster  DOB: April 19, 1933  PCP: Creola Corn, MD ZOX:[id number]  DOA: 07/06/2022  LOS: 4 days  Hospital Day: 5  Brief narrative: Alejandro Foster is a 89 y.o. male with PMH significant for DM2, HTN, HLD, CAD/stents, DDD, lumbar spine stenosis, radiculopathy, chronic back pain, peripheral neuropathy, nephrolithiasis who lives at home and does not use any assistive devices to ambulate 4/18, patient was brought to the ED by EMS after a fall at home. Per report, patient was working around his car in his driveway when he tripped and fell hitting his left hip and head, sustained immediate left hip pain and was unable to get up.  Denies LOC  X-rays in the ED showed comminuted displaced and angulated intertrochanteric fracture of the left hip Orthopedic surgery was consulted Admitted to [facility]  Subjective: Patient was seen and examined this morning.   Sitting up in recliner.  Continues to have food stuck sensation.  Assessment and plan: Comminuted Displaced and Angulated Intertrochanteric Fracture of the Left Hip  S/p intramedullary nailing 4/19 - Dr. Roda Shutters Fracture due to mechanical fall Procedure as above. Pain management with Tylenol scheduled.  Oxycodone as needed, IV Dilaudid as needed  Lovenox for DVT prophylaxis PT eval obtained.  CIR recommended.  Heartburn Food stuck sensation PTA, patient was on Pepcid for heartburn.  His symptoms has gotten worse while in the hospital. Complains of food stuck sensation and no endoscopic evaluation in the last several years Esophagogram today suggested possible esophageal dysmotility.  Will discuss with GI to see if he needs inpatient EGD.  Continue as needed Zofran.  Essential hypertension PTA on metoprolol titrate 25 mg daily, amlodipine 5 mg daily, HCTZ 25 mg daily, ramipril 20 mg daily Blood pressure remains controlled on metoprolol 25 mg daily and amlodipine 5 mg nightly.  Ramipril and HCTZ remain on hold.  Blood pressure  trending up.  Resume both today. Continue monitor blood pressure.  Hydralazine IV as needed.   CAD/HLD Recent low risk stress test with Dr. Rosemary Holms PTA on aspirin 81 mg daily, statin and fibrate Aspirin remains on hold.  Plan to resume prior to discharge. Continue statin and fibrate  Type 2 diabetes mellitus A1c 7.1 07/07/2022 PTA on Humalog 75/25 twice daily-35 units AM, 22 units p.m. also on glipizide 2.5 mg twice daily and metformin 500 mg twice daily. Currently on basal bolus regimen with 15 units Levemir twice daily scheduled, Premeal aspart 4 units 3 times daily and sliding scale insulin.  Also continued on glipizide and metformin.  Appetite not great.  I reduced Levemir to 5 units twice daily.  Continue to monitor Continue Neurontin for neuropathy Recent Labs  Lab 07/09/22 1833 07/09/22 2133 07/10/22 0555 07/10/22 0824 07/10/22 1139  GLUCAP 128* 127* 129* 152* 195*   Chronic back pain On Cymbalta 30 mg daily  Overactive bladder Vesicare  Insomnia Ambien nightly  Goals of care   Code Status: Full Code     DVT prophylaxis:  enoxaparin (LOVENOX) injection 40 mg Start: 07/08/22 0800 SCDs Start: 07/07/22 1701 Place TED hose Start: 07/07/22 1701 SCDs Start: 07/07/22 0026   Antimicrobials: None Fluid: Stop IV hydration Consultants: Orthopedics Family Communication: Wife and other family members at bedside  Status: Inpatient Level of care:  Telemetry Medical   Needs to continue in-hospital care:  Pending CIR.  May also need EGD  Patient from: Home Anticipated d/c to: Pending CIR   Diet:  Diet Order  Diet Carb Modified Fluid consistency: Thin; Room service appropriate? Yes  Diet effective now                   Scheduled Meds:  alum & mag hydroxide-simeth  30 mL Oral TID AC   amLODipine  5 mg Oral QPM   atorvastatin  20 mg Oral QPM   docusate sodium  100 mg Oral BID   DULoxetine  30 mg Oral Daily   enoxaparin (LOVENOX) injection   40 mg Subcutaneous Q24H   fesoterodine  4 mg Oral Daily   gabapentin  100 mg Oral BID   And   gabapentin  200 mg Oral QHS   glipiZIDE  5 mg Oral BID AC   hydrochlorothiazide  25 mg Oral q morning   insulin aspart  0-15 Units Subcutaneous TID WC   insulin aspart  0-5 Units Subcutaneous QHS   insulin detemir  5 Units Subcutaneous BID   metFORMIN  1,000 mg Oral BID WC   metoprolol tartrate  25 mg Oral q morning   pantoprazole  40 mg Oral Daily   polyethylene glycol  17 g Oral Daily   ramipril  20 mg Oral Daily    PRN meds: acetaminophen, hydrALAZINE, HYDROmorphone (DILAUDID) injection, magnesium citrate, menthol-cetylpyridinium **OR** phenol, methocarbamol **OR** methocarbamol (ROBAXIN) IV, morphine injection, ondansetron **OR** ondansetron (ZOFRAN) IV, oxyCODONE, oxyCODONE, polyethylene glycol, sorbitol, zolpidem   Infusions:   methocarbamol (ROBAXIN) IV      Antimicrobials: Anti-infectives (From admission, onward)    Start     Dose/Rate Route Frequency Ordered Stop   07/07/22 2100  ceFAZolin (ANCEF) IVPB 2g/100 mL premix        2 g 200 mL/hr over 30 Minutes Intravenous Every 6 hours 07/07/22 1701 07/08/22 0855   07/07/22 0600  ceFAZolin (ANCEF) IVPB 2g/100 mL premix        2 g 200 mL/hr over 30 Minutes Intravenous On call to O.R. 07/06/22 1925 07/07/22 1445       Nutritional status:  Body mass index is 27.06 kg/m.          Objective: Vitals:   07/10/22 0729 07/10/22 1534  BP: (!) 158/66 (!) 132/59  Pulse: 89 65  Resp: 16 16  Temp: 98.1 F (36.7 C) (!) 97.4 F (36.3 C)  SpO2: 92% 95%    Intake/Output Summary (Last 24 hours) at 07/10/2022 1627 Last data filed at 07/10/2022 0234 Gross per 24 hour  Intake 340 ml  Output 500 ml  Net -160 ml   There were no vitals filed for this visit. Weight change:  Body mass index is 27.06 kg/m.   Physical Exam: General exam: Pleasant, elderly Caucasian male.  Not in distress.  Pain controlled Skin: No rashes,  lesions or ulcers. HEENT: Atraumatic, normocephalic, no obvious bleeding Lungs: Clear to auscultation bilaterally CVS: Regular rate and rhythm, no murmur GI/Abd soft, continues to have epigastric tenderness.  Nontender, nondistended, bowel sound present CNS: Alert awake oriented x 3 Psychiatry: Mood appropriate Extremities: No pedal edema, no calf tenderness  Data Review: I have personally reviewed the laboratory data and studies available.  F/u labs ordered Unresulted Labs (From admission, onward)     Start     Ordered   07/14/22 0500  Creatinine, serum  (enoxaparin (LOVENOX)  CrCl >/= 30 mL/min  )  Weekly,   R     Comments: while on enoxaparin therapy.    07/07/22 1701   07/08/22 0500  CBC  Daily,   R  Comments: For 3 days.    07/07/22 1701            Total time spent in review of labs and imaging, patient evaluation, formulation of plan, documentation and communication with family: 45 minutes  Signed, Lorin Glass, MD [facility] 07/10/2022

## 2022-07-10 NOTE — Progress Notes (Signed)
Physical Therapy Treatment Patient Details Name: Alejandro Foster MRN: [id number] DOB: 1934-02-17 Today's Date: 07/10/2022   History of Present Illness 89 y.o. male admitted 4/18 s/p Lt hip fx and had INTRAMEDULLARY (IM) NAILING OF LEFT FEMUR with PMH significant for DM2, HTN, HLD, CAD/stents, DDD, lumbar spine stenosis, radiculopathy, chronic back pain, peripheral neuropathy, nephrolithiasis. Pt fell while loading his trailer.    PT Comments    Tolerated activity well. Progressing towards goals. Increased ambulatory ability to 10 feet x2 bouts today with min assist for RW control and weight shifting with cues for sequencing. Moderate hip pain with WB, cues to unload as needed with walker for support. Mod assist for boost to stand but approaching min assist quickly. Patient will continue to benefit from skilled physical therapy services to further improve independence with functional mobility. Agreeable to CIR.   Recommendations for follow up therapy are one component of a multi-disciplinary discharge planning process, led by the attending physician.  Recommendations may be updated based on patient status, additional functional criteria and insurance authorization.  Follow Up Recommendations       Assistance Recommended at Discharge Frequent or constant Supervision/Assistance  Patient can return home with the following A lot of help with walking and/or transfers;A little help with bathing/dressing/bathroom;Assistance with cooking/housework;Assist for transportation;Help with stairs or ramp for entrance   Equipment Recommendations  None recommended by PT (TBD next venue)    Recommendations for Other Services Rehab consult     Precautions / Restrictions Precautions Precautions: Fall Restrictions Weight Bearing Restrictions: Yes LLE Weight Bearing: Weight bearing as tolerated     Mobility  Bed Mobility Overal bed mobility: Needs Assistance Bed Mobility: Supine to Sit     Supine to  sit: Min assist, HOB elevated     General bed mobility comments: assist for LLE advancement to EOB    Transfers Overall transfer level: Needs assistance Equipment used: Rolling walker (2 wheels) Transfers: Sit to/from Stand Sit to Stand: Mod assist           General transfer comment: Mod assist for boost to stand from bed and recliner. Cues for hand placement. Improved recall with practice.    Ambulation/Gait Ambulation/Gait assistance: Min assist Gait Distance (Feet): 10 Feet (x2) Assistive device: Rolling walker (2 wheels) Gait Pattern/deviations: Step-to pattern, Decreased stride length, Decreased stance time - left, Decreased weight shift to left, Antalgic Gait velocity: decr Gait velocity interpretation: <1.31 ft/sec, indicative of household ambulator   General Gait Details: Further training for improved symmetry, stability, and safety with RW use. Completed 2 bouts with min assist for RW advancement, weight shift, and VC for sequencing.   Stairs             Wheelchair Mobility    Modified Rankin (Stroke Patients Only)       Balance Overall balance assessment: Needs assistance Sitting-balance support: No upper extremity supported, Feet supported Sitting balance-Leahy Scale: Fair     Standing balance support: Reliant on assistive device for balance, No upper extremity supported Standing balance-Leahy Scale: Fair Standing balance comment: able to briefly stand statically without UE support                            Cognition Arousal/Alertness: Awake/alert Behavior During Therapy: WFL for tasks assessed/performed Overall Cognitive Status: Within Functional Limits for tasks assessed  Exercises General Exercises - Lower Extremity Ankle Circles/Pumps: AROM, Both, 10 reps, Seated Quad Sets: Strengthening, Both, 10 reps, Seated Gluteal Sets: Strengthening, Both, 10 reps, Seated     General Comments        Pertinent Vitals/Pain Pain Assessment Pain Assessment: Faces Faces Pain Scale: Hurts even more Pain Location: L hip when WB Pain Descriptors / Indicators: Aching Pain Intervention(s): Monitored during session    Home Living   Living Arrangements: Spouse/significant other Available Help at Discharge: Family;Available 24 hours/day Type of Home: House Home Access: Stairs to enter Entrance Stairs-Rails: Doctor, general practice of Steps: 3   Home Layout: Able to live on main level with bedroom/bathroom        Prior Function            PT Goals (current goals can now be found in the care plan section) Acute Rehab PT Goals Patient Stated Goal: Get stronger at rehab, then go home PT Goal Formulation: With patient Time For Goal Achievement: 07/21/22 Potential to Achieve Goals: Good Progress towards PT goals: Progressing toward goals    Frequency    Min 5X/week      PT Plan Current plan remains appropriate    Co-evaluation              AM-PAC PT "6 Clicks" Mobility   Outcome Measure  Help needed turning from your back to your side while in a flat bed without using bedrails?: A Little Help needed moving from lying on your back to sitting on the side of a flat bed without using bedrails?: A Little Help needed moving to and from a bed to a chair (including a wheelchair)?: A Lot Help needed standing up from a chair using your arms (e.g., wheelchair or bedside chair)?: A Lot Help needed to walk in hospital room?: A Little Help needed climbing 3-5 steps with a railing? : A Lot 6 Click Score: 15    End of Session Equipment Utilized During Treatment: Gait belt Activity Tolerance: Patient tolerated treatment well Patient left: in chair;with call bell/phone within reach;with chair alarm set;with family/visitor present;with SCD's reapplied   PT Visit Diagnosis: Other abnormalities of gait and mobility (R26.89);Unsteadiness on feet  (R26.81);Muscle weakness (generalized) (M62.81);History of falling (Z91.81);Difficulty in walking, not elsewhere classified (R26.2);Pain Pain - Right/Left: Left Pain - part of body: Hip     Time: 9604-5409 PT Time Calculation (min) (ACUTE ONLY): 23 min  Charges:  $Gait Training: 8-22 mins $Therapeutic Activity: 8-22 mins                     Kathlyn Sacramento, PT, DPT Physical Therapist Acute Rehabilitation Services [facility]    Berton Mount 07/10/2022, 4:45 PM

## 2022-07-10 NOTE — Consult Note (Signed)
Physical Medicine and Rehabilitation Consult Reason for Consult:LEFT hip fracture Referring Physician: Lorin Glass   HPI: Alejandro Foster is a 89 y.o. male with a past medical history significant for coronary artery disease type 2 diabetes mellitus, hypertension, hyperlipidemia who fell at home on 07/06/2022 after tripping around his car.  He was unable to get up.  He was taken to the emergency department where x-rays demonstrated a comminuted displaced and angulated intertrochanteric fracture of the left hip.  Patient was seen by Dr.Xu from orthopedics and taken to the OR.  He has had recent echo in February 2024 showing grade 1 diastolic dysfunction and mildly reduced EF of 40 to 45%.  Least recent low risk stress test with cardiology.  Patient underwent intramedullary nailing of left femur and had no postoperative complications with the exception of acute blood loss anemia.  He was made weightbearing as tolerated left lower extremity.  His pain has been moderate.  Postoperative diabetic management.  Recent hemoglobin A1c of 7.1 indicating fair to good control blood pressures have been mildly elevated.  He is no longer taking IV pain medications Had UGI Ba++ study today for sensation of food sticking, report pending.  If abnormalities are seen plan is for EGD  Review of Systems  Constitutional:  Negative for chills and fever.  HENT:  Negative for hearing loss and nosebleeds.   Respiratory:  Negative for stridor.   Skin: Negative.    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 11/10/2019   Coronary artery disease    cardiologist-  dr Donnie Aho--  s/p  cardiac cath's w/ angioplasty and stenting x2   DDD (degenerative disc disease), lumbar    Diabetic peripheral neuropathy associated with type 2  diabetes mellitus 12/12/2017   Dyslipidemia 01/25/2018   Dyspnea on exertion 08/22/2019   Foraminal stenosis of lumbar region 12/12/2017   History of kidney stones    Hyperlipidemia    Hypertension    Lumbar radiculopathy, right    right leg weakness   Myofascial pain syndrome 12/12/2017   Nephrolithiasis    bilateral non-obstructive per ct 01-19-2016   Peripheral neuropathy    feet   Posterior vitreous detachment of both eyes 11/10/2019   Pseudophakia of both eyes 11/10/2019   Right leg weakness    due to lumbar radiculopathy   Right ureteral stone    S/P coronary artery stent placement    2002 x1  and 2003 x1   Spondylosis without myelopathy or radiculopathy, lumbar region 12/12/2017   Trigger finger 05/28/2018   Type 2 diabetes mellitus    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   COLONOSCOPY WITH PROPOFOL  2014   CORONARY ANGIOPLASTY  1990  dr Donnie Aho  PTCA to OM2   CORONARY ANGIOPLASTY  10/ 1996  dr Donnie Aho   PTCA to RCA   CORONARY ANGIOPLASTY WITH STENT PLACEMENT  04/ 1996  dr Donnie Aho   stenting to RCA   CYSTOSCOPY WITH URETEROSCOPY AND STENT PLACEMENT Right 03/28/2016   Procedure: CYSTOSCOPY WITH RIGHT  URETEROSCOPY AND STENT PLACEMENT;  Surgeon: Jerilee Field, MD;  Location: [facility];  Service: Urology;  Laterality: Right;   EXTRACORPOREAL SHOCK WAVE LITHOTRIPSY  yrs ago   HOLMIUM LASER APPLICATION Right 03/28/2016   Procedure: HOLMIUM LASER APPLICATION;  Surgeon: Jerilee Field, MD;   Location: [facility];  Service: Urology;  Laterality: Right;   LAPAROSCOPIC CHOLECYSTECTOMY  1990's   TONSILLECTOMY AND ADENOIDECTOMY  child   URETEROLITHOTOMY  1990's   Family History  Problem Relation Age of Onset   Gallbladder disease Father    Cancer Brother    Social History:  reports that he quit smoking about 33 years ago. His smoking use included cigarettes. He has a 40.00 pack-year smoking history. He has never used smokeless tobacco. He reports current alcohol use. He reports that he does not use drugs. Allergies:  Allergies  Allergen Reactions   Sulfa Antibiotics Other (See Comments)    Unknown reaction- childhood allergy   Medications Prior to Admission  Medication Sig Dispense Refill   acetaminophen (TYLENOL) 325 MG tablet Take 325 mg by mouth every 6 (six) hours as needed for mild pain or moderate pain. Gel caps     amLODipine (NORVASC) 5 MG tablet Take 5 mg by mouth every evening.      aspirin EC 81 MG tablet Take 81 mg by mouth daily.     atorvastatin (LIPITOR) 20 MG tablet Take 20 mg by mouth every evening.      cetirizine (ZYRTEC) 10 MG tablet Take 10 mg by mouth daily as needed for allergies.     Cholecalciferol (VITAMIN D PO) Take 5 drops by mouth every evening. Unknown strength     DULoxetine (CYMBALTA) 30 MG capsule Take 30 mg by mouth daily.     fenofibrate (TRICOR) 48 MG tablet Take 48 mg by mouth every evening.      gabapentin (NEURONTIN) 100 MG capsule Take 100-200 mg by mouth 2 (two) times daily. Take 100 mg by mouth  at noon and then take  200 mg by mouth at night per patient     glipiZIDE-metformin (METAGLIP) 2.5-500 MG per tablet Take 2 tablets by mouth 2 (two) times daily before a meal.      HUMALOG MIX 75/25 KWIKPEN (75-25) 100 UNIT/ML Kwikpen Inject 22-40 Units into the skin See admin instructions. Take 40 units SQ in the morning and then take 22 units SQ in the evening.  6   hydrochlorothiazide (HYDRODIURIL) 25 MG tablet Take 25 mg by  mouth every morning.      ibuprofen (ADVIL) 200 MG tablet Take 400 mg by mouth every evening.     Magnesium Citrate 125 MG CAPS Take 150 mg by mouth daily.     metoprolol tartrate (LOPRESSOR) 25 MG tablet Take 25 mg by mouth every morning.      ramipril (ALTACE) 10 MG capsule Take 20 mg by mouth daily.     solifenacin (VESICARE) 5 MG tablet Take 5 mg by mouth every morning.      zolpidem (AMBIEN) 10 MG tablet Take 10 mg by mouth at bedtime.      Home: Home Living Family/patient expects to be discharged to:: Private  residence Living Arrangements: Spouse/significant other Available Help at Discharge: Available 24 hours/day Type of Home: House Home Access: Stairs to enter [employer] of Steps: 3 Entrance Stairs-Rails: Right, Left (can not reach both) Home Layout: Able to live on main level with bedroom/bathroom (pnt has his office and bathroom in basement;) Bathroom Shower/Tub: Health visitor: Handicapped height Bathroom Accessibility:  (narrow doors) Home Equipment: None Additional Comments: the shower in basement is more accessable but there is 14 steps down.  Functional History: Prior Function Prior Level of Function : Independent/Modified Independent, Driving Mobility Comments: i ADLs Comments: i with adls. Functional Status:  Mobility: Bed Mobility Overal bed mobility: Needs Assistance Bed Mobility: Supine to Sit Supine to sit: Min assist, HOB elevated General bed mobility comments: assist for LLE advancement to EOB Transfers Overall transfer level: Needs assistance Equipment used: Rolling walker (2 wheels) Transfers: Sit to/from Stand, Bed to chair/wheelchair/BSC Sit to Stand: Mod assist, Min assist Bed to/from chair/wheelchair/BSC transfer type:: Step pivot Stand pivot transfers: Mod assist Step pivot transfers: Min assist General transfer comment: Initially Mod A to stand from bedside, progressing to Min A standing from [facility] pushing from  armrests. increased distance to pivot from [facility] to recliner to further progress WB tolerance Ambulation/Gait Ambulation/Gait assistance: Min assist Gait Distance (Feet): 10 Feet Assistive device: Rolling walker (2 wheels) Gait Pattern/deviations: Step-to pattern, Decreased stride length, Decreased stance time - left, Decreased weight shift to left, Antalgic General Gait Details: Educated on safe AD use with RW. VC for sequencing, min assist for RW control and progress with weight shifting. Step-to pattern. Pt navigated 5 feet forward, became nauseous, and tolerated 5 feet backwards with additional cues for sequencing. Some difficulty lifting LLE adequately from floor for clearance but improved gradually. No buckling, using UE support adequately to unload LLE as needed. Gait velocity: decr Gait velocity interpretation: <1.31 ft/sec, indicative of household ambulator Pre-gait activities: Weight shift, march    ADL: ADL Overall ADL's : Needs assistance/impaired Eating/Feeding: Independent Grooming: Wash/dry hands, Wash/dry face, Set up, Sitting Upper Body Bathing: Minimal assistance, Sitting Lower Body Bathing: Maximal assistance, Sit to/from stand Upper Body Dressing : Minimal assistance, Sitting Lower Body Dressing: Maximal assistance, Sit to/from stand Toilet Transfer: Minimal assistance, Stand-pivot, Rolling walker (2 wheels), BSC/3in1 Toilet Transfer Details (indicate cue type and reason): Min A for RW negotiation to/from [facility] w/ improving carryover after sequencing education Toileting- Clothing Manipulation and Hygiene: Moderate assistance, Sitting/lateral lean, Sit to/from stand Toileting - Clothing Manipulation Details (indicate cue type and reason): able to assist with clothing mgmt though unable to reach posterior region on [facility] - OT assisted with hygiene in standing Functional mobility during ADLs: Moderate assistance, Rolling walker (2 wheels) (Pt. performed stand pivot transfer bed to  recliner.) General ADL Comments: Improving tolerance for WB; able to progress to [facility] (which pt reports eager to do and better than bedpan)  Cognition: Cognition Overall Cognitive Status: Within Functional Limits for tasks assessed Orientation Level: Oriented X4 Cognition Arousal/Alertness: Awake/alert Behavior During Therapy: WFL for tasks assessed/performed Overall Cognitive Status: Within Functional Limits for tasks assessed  Blood pressure (!) 158/66, pulse 89, temperature 98.1 F (36.7 C), temperature source Oral, resp. rate 16, height  (1.727 m), SpO2 92 %. Physical Exam  General: No acute distress, + hiccups Mood and affect are appropriate Heart: Regular rate and rhythm no rubs murmurs or extra sounds Lungs: Clear to auscultation, breathing unlabored, no rales or wheezes Abdomen: Positive bowel sounds, soft nontender to palpation, mildly distended  Extremities: No clubbing, cyanosis, or edema Skin: No evidence of breakdown, no evidence of rash Neurologic: Cranial nerves II through XII intact, motor strength is 5/5 in bilateral deltoid, bicep, tricep, grip, RIght hip flexor, knee extensors, ankle dorsiflexor and plantar flexor  LLE 5/5 in Left ankle DF/PF, 2- Left hip flex , knee flex 3- left quad limited by pain Sensory exam normal sensation to light touch and proprioception in bilateral upper and lower extremities  Musculoskeletal: Full range of motion in BUE, as well as right hip , knee ankle . Mild left supra patellar effusion  Results for orders placed or performed during the hospital encounter of 07/06/22 (from the past 24 hour(s))  Glucose, capillary     Status: Abnormal   Collection Time: 07/09/22  4:05 PM  Result Value Ref Range   Glucose-Capillary 121 (H) 70 - 99 mg/dL  Glucose, capillary     Status: Abnormal   Collection Time: 07/09/22  6:33 PM  Result Value Ref Range   Glucose-Capillary 128 (H) 70 - 99 mg/dL  Glucose, capillary     Status: Abnormal    Collection Time: 07/09/22  9:33 PM  Result Value Ref Range   Glucose-Capillary 127 (H) 70 - 99 mg/dL  Glucose, capillary     Status: Abnormal   Collection Time: 07/10/22  5:55 AM  Result Value Ref Range   Glucose-Capillary 129 (H) 70 - 99 mg/dL   No results found.   Assessment/Plan: Diagnosis: Left IT hip fx comminuted and displaced Does the need for close, 24 hr/day medical supervision in concert with the patient's rehab needs make it unreasonable for this patient to be served in a less intensive setting? Yes Co-Morbidities requiring supervision/potential complications: Diabetes with peripheral neuropathy , CAD, Dysphagia acute Due to bladder management, bowel management, safety, skin/wound care, disease management, medication administration, pain management, and patient education, does the patient require 24 hr/day rehab nursing? Yes Does the patient require coordinated care of a physician, rehab nurse, therapy disciplines of PT,OT to address physical and functional deficits in the context of the above medical diagnosis(es)? Yes Addressing deficits in the following areas: balance, endurance, locomotion, strength, transferring, bowel/bladder control, bathing, dressing, toileting, cognition, and psychosocial support Can the patient actively participate in an intensive therapy program of at least 3 hrs of therapy per day at least 5 days per week? Yes The potential for patient to make measurable gains while on inpatient rehab is good Anticipated functional outcomes upon discharge from inpatient rehab are modified independent  with PT, modified independent and supervision with OT, n/a with SLP. Estimated rehab length of stay to reach the above functional goals is: 10-14d Anticipated discharge destination: Home Overall Rehab/Functional Prognosis: good  RECOMMENDATIONS: This patient's condition is appropriate for continued rehabilitative care in the following setting: CIR Patient has agreed to  participate in recommended program. Yes Note that insurance prior authorization may be required for reimbursement for recommended care.  Comment: awaiting further w/u of dysphagia, would bring after Upper endoscopy if planned    Erick Colace, MD 07/10/2022

## 2022-07-10 NOTE — Progress Notes (Signed)
PT Cancellation Note  Patient Details Name: Alejandro Foster MRN: [id number] DOB: 21-Nov-1933   Cancelled Treatment:    Reason Eval/Treat Not Completed: Other (comment)  Second attempt to see patient today for PT follow-up. Has just returned to bed and planning to have swallow study done shortly, asks to hold PT until this afternoon. Will follow-up as schedule permits.   Berton Mount 07/10/2022, 12:12 PM

## 2022-07-10 NOTE — Progress Notes (Signed)
Inpatient Rehab Admissions Coordinator:  Spoke with pt's wife on the telephone. She informed AC that she and pt are in agreement about pt pursuing CIR. Insurance authorization started. Will continue to follow.   Wolfgang [place], MS, CCC-SLP Admissions Coordinator [phone]

## 2022-07-10 NOTE — TOC CM/SW Note (Signed)
  Transition of Care [facility]) Screening Note   Patient Details  Name: Alejandro Foster Date of Birth: June 27, 1933   [facility] team following, await CIR determination    Transition of Care Department (TOC)  will continue to monitor patient advancement through interdisciplinary progression rounds. If new patient transition needs arise, please place a TOC consult.

## 2022-07-11 ENCOUNTER — Encounter (HOSPITAL_COMMUNITY): Payer: Self-pay | Admitting: Family Medicine

## 2022-07-11 ENCOUNTER — Inpatient Hospital Stay (HOSPITAL_COMMUNITY): Payer: Medicare Other | Admitting: Anesthesiology

## 2022-07-11 ENCOUNTER — Encounter (HOSPITAL_COMMUNITY)
Admission: EM | Disposition: A | Discharge status: Skilled Nursing Facility | Payer: Self-pay | Source: Home / Self Care | Attending: Internal Medicine

## 2022-07-11 DIAGNOSIS — Z7984 Long term (current) use of oral hypoglycemic drugs: Secondary | ICD-10-CM

## 2022-07-11 DIAGNOSIS — K222 Esophageal obstruction: Secondary | ICD-10-CM

## 2022-07-11 DIAGNOSIS — E1149 Type 2 diabetes mellitus with other diabetic neurological complication: Secondary | ICD-10-CM

## 2022-07-11 DIAGNOSIS — Z87891 Personal history of nicotine dependence: Secondary | ICD-10-CM

## 2022-07-11 DIAGNOSIS — I10 Essential (primary) hypertension: Secondary | ICD-10-CM

## 2022-07-11 DIAGNOSIS — Z794 Long term (current) use of insulin: Secondary | ICD-10-CM

## 2022-07-11 DIAGNOSIS — S72142A Displaced intertrochanteric fracture of left femur, initial encounter for closed fracture: Secondary | ICD-10-CM | POA: Diagnosis not present

## 2022-07-11 HISTORY — PX: BIOPSY: SHX5522

## 2022-07-11 HISTORY — PX: SUBMUCOSAL INJECTION: SHX5543

## 2022-07-11 HISTORY — PX: ESOPHAGOGASTRODUODENOSCOPY (EGD) WITH PROPOFOL: SHX5813

## 2022-07-11 LAB — GLUCOSE, CAPILLARY
Glucose-Capillary: 217 mg/dL — ABNORMAL HIGH (ref 70–99)
Glucose-Capillary: 191 mg/dL — ABNORMAL HIGH (ref 70–99)
Glucose-Capillary: 199 mg/dL — ABNORMAL HIGH (ref 70–99)
Glucose-Capillary: 150 mg/dL — ABNORMAL HIGH (ref 70–99)
Glucose-Capillary: 220 mg/dL — ABNORMAL HIGH (ref 70–99)

## 2022-07-11 SURGERY — ESOPHAGOGASTRODUODENOSCOPY (EGD) WITH PROPOFOL
Anesthesia: Monitor Anesthesia Care

## 2022-07-11 MED ORDER — MORPHINE SULFATE (PF) 2 MG/ML IV SOLN: 1.0000 mg | INTRAVENOUS | Status: DC | PRN

## 2022-07-11 MED ORDER — PANTOPRAZOLE SODIUM 40 MG PO TBEC
40.0000 mg | DELAYED_RELEASE_TABLET | Freq: Two times a day (BID) | ORAL | Status: DC
  Administered 2022-07-11 – 2022-07-18 (×15): 40 mg via ORAL
  Filled 2022-07-11 (×15): qty 1

## 2022-07-11 MED ORDER — INSULIN DETEMIR 100 UNIT/ML ~~LOC~~ SOLN
10.0000 [IU] | Freq: Two times a day (BID) | SUBCUTANEOUS | Status: DC
  Administered 2022-07-11: 10 [IU] via SUBCUTANEOUS
  Filled 2022-07-11 (×3): qty 0.1

## 2022-07-11 MED ORDER — LIDOCAINE 2% (20 MG/ML) 5 ML SYRINGE
INTRAMUSCULAR | Status: DC | PRN
  Administered 2022-07-11: 50 mg via INTRAVENOUS

## 2022-07-11 MED ORDER — INSULIN DETEMIR 100 UNIT/ML ~~LOC~~ SOLN
5.0000 [IU] | Freq: Once | SUBCUTANEOUS | Status: AC
  Administered 2022-07-11: 5 [IU] via SUBCUTANEOUS
  Filled 2022-07-11: qty 0.05

## 2022-07-11 MED ORDER — OXYCODONE HCL 5 MG PO TABS
5.0000 mg | ORAL_TABLET | Freq: Four times a day (QID) | ORAL | Status: DC | PRN
  Administered 2022-07-12 – 2022-07-17 (×9): 5 mg via ORAL
  Filled 2022-07-11 (×9): qty 1

## 2022-07-11 MED ORDER — SODIUM CHLORIDE 0.9 % IV SOLN: INTRAVENOUS | Status: DC

## 2022-07-11 MED ORDER — ACETAMINOPHEN 500 MG PO TABS
1000.0000 mg | ORAL_TABLET | Freq: Three times a day (TID) | ORAL | Status: DC
  Administered 2022-07-11 – 2022-07-18 (×21): 1000 mg via ORAL
  Filled 2022-07-11 (×22): qty 2

## 2022-07-11 MED ORDER — PROPOFOL 500 MG/50ML IV EMUL
INTRAVENOUS | Status: DC | PRN
  Administered 2022-07-11: 100 ug/kg/min via INTRAVENOUS
  Administered 2022-07-11 (×2): 20 mg via INTRAVENOUS

## 2022-07-11 MED ORDER — LACTATED RINGERS IV SOLN: INTRAVENOUS | Status: DC

## 2022-07-11 MED ORDER — PANTOPRAZOLE SODIUM 40 MG PO TBEC
40.0000 mg | DELAYED_RELEASE_TABLET | Freq: Two times a day (BID) | ORAL | Status: DC
  Filled 2022-07-11: qty 1

## 2022-07-11 SURGICAL SUPPLY — 15 items

## 2022-07-11 NOTE — Progress Notes (Signed)
PT Cancellation Note  Patient Details Name: TERYL MCCONAGHY MRN: [id number] DOB: 18-Aug-1933   Cancelled Treatment:    Reason Eval/Treat Not Completed: Patient at procedure or test/unavailable  Patient off unit for Upper GI endoscopy. Will continue to follow. Check back this afternoon as schedule permits.   Berton Mount 07/11/2022, 8:14 AM

## 2022-07-11 NOTE — Transfer of Care (Signed)
Immediate Anesthesia Transfer of Care Note  Patient: Alejandro Foster  Procedure(s) Performed: ESOPHAGOGASTRODUODENOSCOPY (EGD) WITH PROPOFOL BIOPSY  Patient Location: Endoscopy Unit  Anesthesia Type:MAC  Level of Consciousness: drowsy and responds to stimulation  Airway & Oxygen Therapy: Patient Spontanous Breathing and Patient connected to nasal cannula oxygen  Post-op Assessment: Report given to RN  Post vital signs: Reviewed and stable  Last Vitals:  Vitals Value Taken Time  BP 139/55 07/11/22 0757  Temp    Pulse 70 07/11/22 0758  Resp 21 07/11/22 0758  SpO2 99 % 07/11/22 0758  Vitals shown include unvalidated device data.  Last Pain:  Vitals:   07/11/22 0720  TempSrc: Oral  PainSc: 0-No pain      Patients Stated Pain Goal: 2 (07/09/22 2221)  Complications: No notable events documented.

## 2022-07-11 NOTE — H&P (Addendum)
Alejandro Foster is an 89 y.o. male.    Referred by: Dr. Dahal/[facility] Primary GI: Previous patient of Dr. [facility]  Chief Complaint: Dysphagia, abnormal barium swallow HPI: 89 year old male states that he has had progressively worsening, intermittent dysphagia to solids and liquids since January 2024.  This is not associated with unintentional weight loss.  On several occasion he feels like food gets stuck in epigastric area and he has to drink water to push it down.  This is not associated with nausea vomiting.  He complains of heartburn and acid reflux for which she takes Pepcid at home.  He denies noticing blood in stool or black stools.  Lately he has been having more loose bowel movements, about 3/day, very rarely has nocturnal diarrhea, denies abdominal pain.  Previous GI workup: Colonoscopy, 2013, screening: Normal, repeat not recommended due to age  Past medical history significant for diabetes, hypertension, dyslipidemia, coronary artery disease, stents, lumbar spine stenosis, radiculopathy, chronic back pain, peripheral neuropathy, nephrolithiasis, admitted after a fall on 4/18 with displaced and angulated intra trochanteric fracture of the left hip status post intramedullary nailing on 07/07/2022.  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 11/10/2019   Coronary artery disease    cardiologist-  dr Donnie Aho--  s/p  cardiac cath's w/ angioplasty and stenting x2   DDD (degenerative disc disease), lumbar    Diabetic peripheral neuropathy associated with type 2 diabetes mellitus 12/12/2017   Dyslipidemia 01/25/2018   Dyspnea on exertion 08/22/2019   Foraminal stenosis of lumbar region 12/12/2017   History of kidney stones    Hyperlipidemia    Hypertension    Lumbar  radiculopathy, right    right leg weakness   Myofascial pain syndrome 12/12/2017   Nephrolithiasis    bilateral non-obstructive per ct 01-19-2016   Peripheral neuropathy    feet   Posterior vitreous detachment of both eyes 11/10/2019   Pseudophakia of both eyes 11/10/2019   Right leg weakness    due to lumbar radiculopathy   Right ureteral stone    S/P coronary artery stent placement    2002 x1  and 2003 x1   Spondylosis without myelopathy or radiculopathy, lumbar region 12/12/2017   Trigger finger 05/28/2018   Type 2 diabetes mellitus    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   COLONOSCOPY WITH PROPOFOL  2014   CORONARY ANGIOPLASTY  1990  dr Donnie Aho   PTCA to OM2   CORONARY ANGIOPLASTY  10/ 1996  dr Donnie Aho   PTCA to RCA   CORONARY ANGIOPLASTY WITH STENT PLACEMENT  04/ 1996  dr Donnie Aho   stenting to RCA   CYSTOSCOPY WITH URETEROSCOPY AND STENT PLACEMENT Right 03/28/2016   Procedure: CYSTOSCOPY WITH RIGHT  URETEROSCOPY AND  STENT PLACEMENT;  Surgeon: Jerilee Field, MD;  Location: [facility];  Service: Urology;  Laterality: Right;   EXTRACORPOREAL SHOCK WAVE LITHOTRIPSY  yrs ago   HOLMIUM LASER APPLICATION Right 03/28/2016   Procedure: HOLMIUM LASER APPLICATION;  Surgeon: Jerilee Field, MD;  Location: [facility];  Service: Urology;  Laterality: Right;   INTRAMEDULLARY (IM) NAIL INTERTROCHANTERIC Left 07/07/2022   Procedure: INTRAMEDULLARY (IM) NAILING OF LEFT FEMUR;  Surgeon: Tarry Kos, MD;  Location: [facility] OR;  Service: Orthopedics;  Laterality: Left;   LAPAROSCOPIC CHOLECYSTECTOMY  1990's   TONSILLECTOMY AND ADENOIDECTOMY  child   URETEROLITHOTOMY  1990's    Family History  Problem Relation Age of Onset   Gallbladder disease Father    Cancer Brother    Social History:  reports that he quit smoking about 33 years ago. His smoking use included cigarettes. He has a 40.00 pack-year smoking history. He has never used smokeless tobacco. He reports current alcohol use. He reports that he does not use drugs.  Allergies:  Allergies  Allergen Reactions   Sulfa Antibiotics Other (See Comments)    Unknown reaction- childhood allergy    Medications Prior to Admission  Medication Sig Dispense Refill   acetaminophen (TYLENOL) 325 MG tablet Take 325 mg by mouth every 6 (six) hours as needed for mild pain or moderate pain. Gel caps     amLODipine (NORVASC) 5 MG tablet Take 5 mg by mouth every evening.      aspirin EC 81 MG tablet Take 81 mg by mouth daily.     atorvastatin (LIPITOR) 20 MG tablet Take 20 mg by mouth every evening.      cetirizine (ZYRTEC) 10 MG tablet Take 10 mg by mouth daily as needed for allergies.     Cholecalciferol (VITAMIN D PO) Take 5 drops by mouth every evening. Unknown strength     DULoxetine (CYMBALTA) 30 MG capsule Take 30 mg by mouth daily.     fenofibrate (TRICOR) 48 MG tablet Take 48 mg by mouth every evening.      gabapentin (NEURONTIN) 100 MG capsule Take 100-200 mg by mouth 2 (two) times daily. Take 100 mg by mouth  at noon and then take  200 mg by mouth at night per patient     glipiZIDE-metformin (METAGLIP) 2.5-500 MG per tablet Take 2 tablets by mouth 2 (two) times daily before a meal.      HUMALOG MIX 75/25 KWIKPEN (75-25) 100 UNIT/ML Kwikpen Inject 22-40 Units into the skin See admin instructions. Take 40 units SQ in the morning and then take 22 units SQ in the evening.  6   hydrochlorothiazide (HYDRODIURIL) 25 MG tablet Take 25 mg  by mouth every morning.      ibuprofen (ADVIL) 200 MG tablet Take 400 mg by mouth every evening.     Magnesium Citrate 125 MG CAPS Take 150 mg by mouth daily.     metoprolol tartrate (LOPRESSOR) 25 MG tablet Take 25 mg by mouth every morning.      ramipril (ALTACE) 10 MG capsule Take 20 mg by mouth daily.     solifenacin (VESICARE) 5 MG tablet Take 5 mg by mouth every morning.      zolpidem (AMBIEN) 10 MG tablet Take 10 mg by mouth at bedtime.      Results for orders placed or performed during the hospital encounter of 07/06/22 (from the past 48 hour(s))  Glucose, capillary     Status: Abnormal   Collection  Time: 07/09/22  9:52 AM  Result Value Ref Range   Glucose-Capillary 169 (H) 70 - 99 mg/dL    Comment: Glucose reference range applies only to samples taken after fasting for at least 8 hours.  Glucose, capillary     Status: Abnormal   Collection Time: 07/09/22 11:50 AM  Result Value Ref Range   Glucose-Capillary 258 (H) 70 - 99 mg/dL    Comment: Glucose reference range applies only to samples taken after fasting for at least 8 hours.  Glucose, capillary     Status: Abnormal   Collection Time: 07/09/22  4:05 PM  Result Value Ref Range   Glucose-Capillary 121 (H) 70 - 99 mg/dL    Comment: Glucose reference range applies only to samples taken after fasting for at least 8 hours.  Glucose, capillary     Status: Abnormal   Collection Time: 07/09/22  6:33 PM  Result Value Ref Range   Glucose-Capillary 128 (H) 70 - 99 mg/dL    Comment: Glucose reference range applies only to samples taken after fasting for at least 8 hours.  Glucose, capillary     Status: Abnormal   Collection Time: 07/09/22  9:33 PM  Result Value Ref Range   Glucose-Capillary 127 (H) 70 - 99 mg/dL    Comment: Glucose reference range applies only to samples taken after fasting for at least 8 hours.  Glucose, capillary     Status: Abnormal   Collection Time: 07/10/22  5:55 AM  Result Value Ref Range    Glucose-Capillary 129 (H) 70 - 99 mg/dL    Comment: Glucose reference range applies only to samples taken after fasting for at least 8 hours.  Glucose, capillary     Status: Abnormal   Collection Time: 07/10/22  8:24 AM  Result Value Ref Range   Glucose-Capillary 152 (H) 70 - 99 mg/dL    Comment: Glucose reference range applies only to samples taken after fasting for at least 8 hours.   Comment 1 Notify RN   Glucose, capillary     Status: Abnormal   Collection Time: 07/10/22 11:39 AM  Result Value Ref Range   Glucose-Capillary 195 (H) 70 - 99 mg/dL    Comment: Glucose reference range applies only to samples taken after fasting for at least 8 hours.   Comment 1 Notify RN   Glucose, capillary     Status: Abnormal   Collection Time: 07/10/22  6:23 PM  Result Value Ref Range   Glucose-Capillary 207 (H) 70 - 99 mg/dL    Comment: Glucose reference range applies only to samples taken after fasting for at least 8 hours.  Glucose, capillary     Status: Abnormal   Collection Time: 07/10/22  8:22 PM  Result Value Ref Range   Glucose-Capillary 195 (H) 70 - 99 mg/dL    Comment: Glucose reference range applies only to samples taken after fasting for at least 8 hours.   DG ESOPHAGUS W SINGLE CM (SOL OR THIN BA)  Result Date: 07/10/2022 CLINICAL DATA:  89 year old admitted with comminuted displaced and angulated intertrochanteric fracture of the left hip after mechanical fall, status post intramedullary nailing 07/07/2022. Patient referred for fluoroscopic esophagram study due to complaint of feeling that food is sticking in mid chest. He denies difficulty swallowing medications. EXAM: ESOPHAGUS/BARIUM SWALLOW/TABLET STUDY TECHNIQUE: Single contrast examination was performed using thin liquid barium. This exam was performed by Alex Gardener, NP, and was supervised and interpreted by Leanna Battles, MD. FLUOROSCOPY: Radiation Exposure Index (as provided  by the fluoroscopic device): 60.30 mGy Kerma  COMPARISON:  None Available. FINDINGS: Note: Limited examination was performed due to patient condition. Swallowing: Appears normal. No vestibular penetration or aspiration seen. Pharynx: Unremarkable. Esophagus: No esophageal fold thickening, stricture or obstruction. Esophageal motility: Esophageal dysmotility with ineffective tertiary contractions. To and fro motion of barium and stasis of barium in esophagus with swallows. Hiatal Hernia: None. Gastroesophageal reflux: Spontaneous small volume gastroesophageal reflux noted to mid esophagus. Ingested 13mm barium tablet: 13 mm barium tablet would not pass beyond the distal esophagus despite multiple swallows of water and barium. Other: None. IMPRESSION: 1. Limited examination performed due to patient condition. 2. 13 mm barium tablet would not pass beyond the distal esophagus despite the aid of thin barium. However, an associated stricture is not readily identified during dynamic imaging. 3. Esophageal dysmotility. 4. Spontaneous gastroesophageal reflux. Read by: Alex Gardener, AGNP-BC Electronically Signed   By: Leanna Battles M.D.   On: 07/10/2022 14:55    Review of Systems  Constitutional:  Positive for appetite change. Negative for unexpected weight change.  Respiratory:  Positive for chest tightness.   Gastrointestinal:  Positive for diarrhea. Negative for abdominal pain, nausea and vomiting.    Blood pressure (!) 157/60, pulse 91, temperature 98.1 F (36.7 C), temperature source Oral, resp. rate (!) 23, height 5\' 8"  (1.727 m), weight 81.6 kg, SpO2 95 %. Physical Exam Constitutional:      Appearance: Normal appearance.  HENT:     Head: Normocephalic and atraumatic.  Eyes:     Conjunctiva/sclera: Conjunctivae normal.  Cardiovascular:     Rate and Rhythm: Normal rate and regular rhythm.  Pulmonary:     Effort: Pulmonary effort is normal.     Breath sounds: Normal breath sounds.  Abdominal:     General: Abdomen is flat. Bowel sounds are  normal.     Palpations: Abdomen is soft.  Skin:    General: Skin is warm and dry.  Neurological:     General: No focal deficit present.     Mental Status: He is alert and oriented to person, place, and time.  Psychiatric:        Mood and Affect: Mood normal.        Behavior: Behavior normal.      Assessment/Plan Intermittent dysphagia to solids and liquids for the past 6 months without associated weight loss Normocytic anemia Esophagogram showed 13 mm barium tablet would not pass beyond distal esophagus, although stricture not noted, patient noted to have esophageal dysmotility and spontaneous gastroesophageal reflux.   Diagnostic EGD today with esophageal biopsies to evaluate for eosinophilic esophagitis. Possible balloon dilation if an obvious narrowing or stricture is noted. Possible Botox injection. Discussed about the risks and the benefits of the procedure in details with the patient. He understands and verbalizes consent.  Kerin Salen, MD 07/11/2022, 7:31 AM

## 2022-07-11 NOTE — Plan of Care (Signed)
  Problem: Education: Goal: Ability to describe self-care measures that may prevent or decrease complications (Diabetes Survival Skills Education) will improve 07/11/2022 1105 by Myrle Sheng, RN Outcome: Progressing 07/11/2022 1105 by Myrle Sheng, RN Outcome: Progressing   Problem: Education: Goal: Individualized Educational Video(s) 07/11/2022 1105 by Myrle Sheng, RN Outcome: Progressing 07/11/2022 1105 by Myrle Sheng, RN Outcome: Progressing   Problem: Coping: Goal: Ability to adjust to condition or change in health will improve 07/11/2022 1105 by Myrle Sheng, RN Outcome: Progressing 07/11/2022 1105 by Myrle Sheng, RN Outcome: Progressing   Problem: Health Behavior/Discharge Planning: Goal: Ability to identify and utilize available resources and services will improve 07/11/2022 1105 by Myrle Sheng, RN Outcome: Progressing 07/11/2022 1105 by Myrle Sheng, RN Outcome: Progressing   Problem: Metabolic: Goal: Ability to maintain appropriate glucose levels will improve 07/11/2022 1105 by Myrle Sheng, RN Outcome: Progressing 07/11/2022 1105 by Myrle Sheng, RN Outcome: Progressing   Problem: Nutritional: Goal: Maintenance of adequate nutrition will improve 07/11/2022 1105 by Myrle Sheng, RN Outcome: Progressing 07/11/2022 1105 by Myrle Sheng, RN Outcome: Progressing

## 2022-07-11 NOTE — Op Note (Signed)
[facility] Patient Name: Alejandro Foster Procedure Date : 07/11/2022 MRN: [id number] Attending MD: Kerin Salen , MD, [id number] Date of Birth: 1934-03-17 CSN: [id number] Age: 89 Admit Type: Inpatient Procedure:                Upper GI endoscopy Indications:              Dysphagia, Abnormal cine-esophagram, 13 mm barium                            tablet did not pass, esophageal dysmotility and GERD Providers:                Kerin Salen, MD, Vicki Mallet, RN, Marja Kays,                            Technician Referring MD:             [facility] Medicines:                See the Anesthesia note for documentation of the                            administered medications Complications:            No immediate complications. Estimated blood loss:                            Minimal. Estimated Blood Loss:     Estimated blood loss was minimal. Procedure:                Pre-Anesthesia Assessment:                           - Prior to the procedure, a History and Physical                            was performed, and patient medications and                            allergies were reviewed. The patient's tolerance of                            previous anesthesia was also reviewed. The risks                            and benefits of the procedure and the sedation                            options and risks were discussed with the patient.                            All questions were answered, and informed consent                            was obtained. Prior Anticoagulants: The patient has  taken no anticoagulant or antiplatelet agents                            except for aspirin. ASA Grade Assessment: III - A                            patient with severe systemic disease. After                            reviewing the risks and benefits, the patient was                            deemed in satisfactory condition to undergo the                             procedure.                           After obtaining informed consent, the endoscope was                            passed under direct vision. Throughout the                            procedure, the patient's blood pressure, pulse, and                            oxygen saturations were monitored continuously. The                            [id number]-H190 ([id number]) Olympus endoscope was introduced                            through the mouth, and advanced to the second part                            of duodenum. The upper GI endoscopy was                            accomplished without difficulty. The patient                            tolerated the procedure well. Scope In: Scope Out: Findings:      LA Grade D (one or more mucosal breaks involving at least 75% of       esophageal circumference) esophagitis with no bleeding was found 30 to       40 cm from the incisors, worse in the distal 5 cm of esophagus. Biopsies       were obtained from the proximal and distal esophagus with cold forceps       for histology of suspected eosinophilic esophagitis.      No obvious stricture noted.      An area, 1 cm above the GE junction was successfully injected with 100       units botulinum toxin, 25 units/ml, 1 ml  in each quadrant of distal       esophagus.      A 2 cm hiatal hernia was present.      A few localized small erosions with no bleeding and no stigmata of       recent bleeding were found in the gastric antrum. Biopsies were taken       with a cold forceps for Helicobacter pylori testing.      The cardia and gastric fundus were normal on retroflexion.      The examined duodenum was normal. Impression:               - LA Grade D reflux, acute and erosive esophagitis                            with no bleeding. Injected with botulinum toxin.                           - 2 cm hiatal hernia.                           - Erosive gastropathy with no bleeding and no                             stigmata of recent bleeding. Biopsied.                           - Normal examined duodenum.                           - Biopsies were taken with a cold forceps for                            evaluation of eosinophilic esophagitis. Recommendation:           - Resume regular diet.                           - Continue present medications.                           - Await pathology results.                           - PPI BID for 90 days, avoid ASA and NSAIDs if                            possible.                           - Treat H pylori if found. Procedure Code(s):        --- Professional ---                           [phone], Esophagogastroduodenoscopy, flexible,                            transoral; with biopsy, single or multiple  43236, 59, Esophagogastroduodenoscopy, flexible,                            transoral; with directed submucosal injection(s),                            any substance Diagnosis Code(s):        --- Professional ---                           K21.00, Gastro-esophageal reflux disease with                            esophagitis, without bleeding                           K20.80, Other esophagitis without bleeding                           K44.9, Diaphragmatic hernia without obstruction or                            gangrene                           K31.89, Other diseases of stomach and duodenum                           R13.10, Dysphagia, unspecified                           R93.3, Abnormal findings on diagnostic imaging of                            other parts of digestive tract CPT copyright 2022 [employer]. All rights reserved. The codes documented in this report are preliminary and upon coder review may  be revised to meet current compliance requirements. Kerin Salen, MD 07/11/2022 8:01:20 AM This report has been signed electronically. Number of Addenda: 0

## 2022-07-11 NOTE — Progress Notes (Signed)
Inpatient Rehabilitation Admissions Coordinator   I have received a denial for CIR admit after peer to peer with Dr Riley Kill and MD and [facility] community care. I met with patient and his wife and will proceed with Appeal with [facility] medicare. Typically takes 48 hrs to get their determination.  Ottie Glazier, RN, MSN Rehab Admissions Coordinator [phone] 07/11/2022 2:59 PM

## 2022-07-11 NOTE — [place] FL2 (Signed)
[place] MEDICAID FL2 LEVEL OF CARE FORM     IDENTIFICATION  Patient Name: Alejandro Foster Birthdate: 12/22/1933 Sex: male Admission Date (Current Location): 07/06/2022  [facility] and [place] Number:  Producer, television/film/video and Address:  [facility], [address] [place], [place] [address]      Provider Number: [id number]  Attending Physician Name and Address:  Lorin Glass, MD  Relative Name and Phone Number:  Ariez Neilan,  [phone]    Current Level of Care: Hospital Recommended Level of Care: Skilled Nursing Facility Prior Approval Number:    Date Approved/Denied:   PASRR Number: [id number]  Discharge Plan: SNF    Current Diagnoses: Patient Active Problem List   Diagnosis Date Noted   Fall 07/06/2022   Displaced intertrochanteric fracture of left femur, initial encounter for closed fracture 07/06/2022   CAD (coronary artery disease) 07/06/2022   Coronary artery disease of bypass graft of native heart with stable angina pectoris 05/12/2022   Paving stone retinal degeneration, bilateral 11/03/2021   Senile purpura 09/07/2020   Claudication 03/17/2020   Wears hearing aid    Type 2 diabetes mellitus    S/P coronary artery stent placement    Right ureteral stone    Right leg weakness    Lumbar radiculopathy, right    History of kidney stones    DDD (degenerative disc disease), lumbar    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 11/10/2019   Pseudophakia of both eyes 11/10/2019   Posterior vitreous detachment of both eyes 11/10/2019   Amnesia 09/19/2019   Dyspnea on exertion 08/22/2019   Atypical chest pain 08/22/2019   Diabetic retinopathy associated with type 2 diabetes mellitus 03/27/2019   Trigger finger 05/28/2018   Dyslipidemia 01/25/2018   Diabetic peripheral neuropathy associated with type 2 diabetes mellitus 12/12/2017   Chronic bilateral thoracic back pain 12/12/2017    Myofascial pain syndrome 12/12/2017   Foraminal stenosis of lumbar region 12/12/2017   Spondylosis without myelopathy or radiculopathy, lumbar region 12/12/2017   Cough 09/17/2017   Irritability and anger 09/17/2017   Localized edema 08/01/2017   Abnormal gait 01/09/2017   Fatigue 09/12/2016   Hypertensive heart disease without congestive heart failure 09/03/2014   Long term (current) use of insulin 09/03/2014   Ventricular premature depolarization 09/03/2014   Encounter for general adult medical examination without abnormal findings 08/26/2014   Gout 08/28/2013   Impacted cerumen 08/28/2013   Hypo-osmolality and hyponatremia 08/28/2013   Urge incontinence of urine 08/26/2012   Age-related osteoporosis without current pathological fracture 02/19/2012   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   Vitamin D deficiency 02/19/2012   Underimmunization status 12/26/2011   Backache 08/21/2011   Hearing loss 05/23/2011   Type 2 diabetes mellitus with other diabetic neurological complication 03/22/2010   Kidney stone 05/25/2009   ED (erectile dysfunction) of organic origin 05/25/2009   Obesity 05/25/2009   Metabolic syndrome 05/25/2009   Polyneuropathy 12/25/2008   Hyperglycemia due to type 2 diabetes mellitus 12/25/2008   Gastro-esophageal reflux disease without esophagitis 12/25/2008   Insomnia 12/25/2008   Dilatation of aorta 10/02/2008   Essential hypertension 10/02/2008   Hyperlipidemia 10/02/2008    Orientation RESPIRATION BLADDER Height & Weight     Self, Time, Situation, Place  Normal Continent Weight: 180 lb (81.6 kg) Height:   (172.7 cm)  BEHAVIORAL SYMPTOMS/MOOD NEUROLOGICAL BOWEL NUTRITION STATUS      Continent Diet (See DC summary)  AMBULATORY STATUS COMMUNICATION  OF NEEDS Skin   Limited Assist Verbally Surgical wounds (L thigh Incision)                       Personal Care Assistance Level of Assistance  Bathing, Feeding, Dressing Bathing Assistance: Limited  assistance Feeding assistance: Independent Dressing Assistance: Limited assistance     Functional Limitations Info  Sight, Hearing, Speech Sight Info: Adequate Hearing Info: Impaired Speech Info: Adequate    SPECIAL CARE FACTORS FREQUENCY  PT (By licensed PT), OT (By licensed OT)     PT Frequency: 5x week OT Frequency: 5x week            Contractures Contractures Info: Not present    Additional Factors Info  Code Status, Allergies, Psychotropic, Insulin Sliding Scale Code Status Info: Full Allergies Info: Sulfa Antibiotics Psychotropic Info: Duloxetine Insulin Sliding Scale Info: See DC summary       Current Medications (07/11/2022):  This is the current hospital active medication list Current Facility-Administered Medications  Medication Dose Route Frequency Provider Last Rate Last Admin   acetaminophen (TYLENOL) tablet 1,000 mg  1,000 mg Oral TID Dahal, Melina Schools, MD       alum & mag hydroxide-simeth (MAALOX/MYLANTA) 200-200-20 MG/5ML suspension 30 mL  30 mL Oral TID AC Kerin Salen, MD   30 mL at 07/11/22 1248   amLODipine (NORVASC) tablet 5 mg  5 mg Oral QPM Kerin Salen, MD   5 mg at 07/10/22 1822   atorvastatin (LIPITOR) tablet 20 mg  20 mg Oral QPM Kerin Salen, MD   20 mg at 07/10/22 1822   docusate sodium (COLACE) capsule 100 mg  100 mg Oral BID Kerin Salen, MD   100 mg at 07/09/22 2217   DULoxetine (CYMBALTA) DR capsule 30 mg  30 mg Oral Daily Kerin Salen, MD   30 mg at 07/11/22 1610   fesoterodine (TOVIAZ) tablet 4 mg  4 mg Oral Daily Kerin Salen, MD   4 mg at 07/11/22 0845   gabapentin (NEURONTIN) capsule 100 mg  100 mg Oral BID Kerin Salen, MD   100 mg at 07/11/22 1425   And   gabapentin (NEURONTIN) capsule 200 mg  200 mg Oral QHS Kerin Salen, MD   200 mg at 07/10/22 2120   glipiZIDE (GLUCOTROL) tablet 5 mg  5 mg Oral BID Nash Shearer, MD   5 mg at 07/11/22 0854   hydrALAZINE (APRESOLINE) injection 10 mg  10 mg Intravenous Q6H PRN Kerin Salen, MD        hydrochlorothiazide (HYDRODIURIL) tablet 25 mg  25 mg Oral q morning Kerin Salen, MD   25 mg at 07/11/22 0852   insulin aspart (novoLOG) injection 0-15 Units  0-15 Units Subcutaneous TID WC Kerin Salen, MD   3 Units at 07/11/22 1249   insulin aspart (novoLOG) injection 0-5 Units  0-5 Units Subcutaneous QHS Kerin Salen, MD   2 Units at 07/07/22 2300   insulin detemir (LEVEMIR) injection 10 Units  10 Units Subcutaneous BID Dahal, Melina Schools, MD       magnesium citrate solution 1 Bottle  1 Bottle Oral Once PRN Kerin Salen, MD       menthol-cetylpyridinium (CEPACOL) lozenge 3 mg  1 lozenge Oral PRN Kerin Salen, MD       Or   phenol (CHLORASEPTIC) mouth spray 1 spray  1 spray Mouth/Throat PRN Kerin Salen, MD       metFORMIN (GLUCOPHAGE) tablet 1,000 mg  1,000 mg Oral BID WC Marca Ancona,  Marcos Eke, MD   1,000 mg at 07/11/22 0853   methocarbamol (ROBAXIN) tablet 500 mg  500 mg Oral Q6H PRN Kerin Salen, MD   500 mg at 07/09/22 1325   Or   methocarbamol (ROBAXIN) 500 mg in dextrose 5 % 50 mL IVPB  500 mg Intravenous Q6H PRN Kerin Salen, MD       metoprolol tartrate (LOPRESSOR) tablet 25 mg  25 mg Oral q morning Kerin Salen, MD   25 mg at 07/11/22 5784   morphine (PF) 2 MG/ML injection 1 mg  1 mg Intravenous Q4H PRN Dahal, Melina Schools, MD       ondansetron (ZOFRAN) tablet 4 mg  4 mg Oral Q6H PRN Kerin Salen, MD       Or   ondansetron [facility]) injection 4 mg  4 mg Intravenous Q6H PRN Kerin Salen, MD   4 mg at 07/09/22 1352   oxyCODONE (Oxy IR/ROXICODONE) immediate release tablet 5 mg  5 mg Oral Q6H PRN Dahal, Melina Schools, MD       pantoprazole (PROTONIX) EC tablet 40 mg  40 mg Oral BID AC Kerin Salen, MD       polyethylene glycol (MIRALAX / GLYCOLAX) packet 17 g  17 g Oral Daily Kerin Salen, MD   17 g at 07/11/22 0845   polyethylene glycol (MIRALAX / GLYCOLAX) packet 17 g  17 g Oral Daily PRN Kerin Salen, MD       ramipril (ALTACE) capsule 20 mg  20 mg Oral Daily Kerin Salen, MD   20 mg at 07/11/22 0854   sorbitol 70 % solution  30 mL  30 mL Oral Daily PRN Kerin Salen, MD       zolpidem Remus Loffler) tablet 5 mg  5 mg Oral QHS PRN Kerin Salen, MD   5 mg at 07/06/22 2130     Discharge Medications: Please see discharge summary for a list of discharge medications.  Relevant Imaging Results:  Relevant Lab Results:   Additional Information SS# [id number]  Carley Hammed, LCSWA

## 2022-07-11 NOTE — Progress Notes (Addendum)
PROGRESS NOTE  Alejandro Foster  DOB: [birth year]  PCP: Creola Corn, MD ZOX:[id number]  DOA: 07/06/2022  LOS: 5 days  Hospital Day: 6  Brief narrative: Alejandro Foster is a 89 y.o. male with PMH significant for DM2, HTN, HLD, CAD/stents, DDD, lumbar spine stenosis, radiculopathy, chronic back pain, peripheral neuropathy, nephrolithiasis who lives at home and does not use any assistive devices to ambulate 4/18, patient was brought to the ED by EMS after a fall at home. Per report, patient was working around his car in his driveway when he tripped and fell hitting his left hip and head, sustained immediate left hip pain and was unable to get up.  Denies LOC  X-rays in the ED showed comminuted displaced and angulated intertrochanteric fracture of the left hip Orthopedic surgery was consulted Admitted to [facility]  Subjective: Patient was seen and examined this morning.   Propped up in bed.  Not in distress.  Wife at bedside. Underwent EGD today.  Found to have severe esophagitis.  Assessment and plan: Comminuted Displaced and Angulated Intertrochanteric Fracture of the Left Hip  S/p intramedullary nailing 4/19 - Dr. Roda Shutters Fracture due to mechanical fall Procedure as above. Pain management with Tylenol scheduled, Oxycodone as needed, IV morphine as needed  Currently on Lovenox for DVT prophylaxis PT eval obtained.  CIR recommended.  Severe esophagitis Patient complained of chronic heartburn and food stuck sensation.  He takes Pepcid PRN Mylanta at home.  His symptoms were especially worse last few days in the hospital.   GI was consulted.  Underwent EGD today 4/23.  It showed severe acute and erosive esophagitis with no bleeding.  He also has erosive gastropathy with no bleeding and no stigmata of recent bleeding.  Biopsy taken.  Because of dysmotility that was noticed in esophagogram, botulinum toxin was injected. Patient to continue Protonix twice daily for 90 days.  Avoid aspirin and NSAIDs To  follow-up with GI as an outpatient.  Biopsy and H. pylori test result. Regular diet resumed.  Essential hypertension PTA on metoprolol titrate 25 mg daily, amlodipine 5 mg daily, HCTZ 25 mg daily, ramipril 20 mg daily Currently continued on all.  Blood pressure and heart rate remains in normal range.  IV hydralazine as needed to continue   CAD/HLD Recent low risk stress test with Dr. Rosemary Holms PTA on aspirin 81 mg daily, statin and fibrate Aspirin remains on hold.  Plan to resume prior to discharge. Continue statin and fibrate  Type 2 diabetes mellitus A1c 7.1 07/07/2022 PTA on Humalog 75/25 twice daily-35 units AM, 22 units p.m. also on glipizide 2.5 mg twice daily and metformin 500 mg twice daily. Continue glipizide and metformin. Currently on basal bolus regimen with 5 units Levemir twice daily scheduled and sliding scale insulin.  Blood sugar level trending up.  Increase Levemir to 10 units twice daily.  Continue to monitor Continue Neurontin for neuropathy Recent Labs  Lab 07/10/22 1823 07/10/22 2022 07/11/22 0729 07/11/22 0835 07/11/22 1201  GLUCAP 207* 195* 217* 191* 199*   Chronic back pain On Cymbalta 30 mg daily  Overactive bladder Vesicare  Insomnia Ambien nightly  Goals of care   Code Status: Full Code     DVT prophylaxis:  SCDs Start: 07/07/22 1701 Place TED hose Start: 07/07/22 1701 SCDs Start: 07/07/22 0026   Antimicrobials: None Fluid: Not on IV fluid Consultants: Orthopedics Family Communication: Wife at bedside  Status: Inpatient Level of care:  Telemetry Medical   Needs to continue in-hospital care:  Pending CIR.   Patient from: Home Anticipated d/c to: Pending CIR   Diet:  Diet Order             Diet regular Room service appropriate? Yes; Fluid consistency: Thin  Diet effective now                   Scheduled Meds:  acetaminophen  1,000 mg Oral TID   alum & mag hydroxide-simeth  30 mL Oral TID AC   amLODipine  5 mg  Oral QPM   atorvastatin  20 mg Oral QPM   docusate sodium  100 mg Oral BID   DULoxetine  30 mg Oral Daily   fesoterodine  4 mg Oral Daily   gabapentin  100 mg Oral BID   And   gabapentin  200 mg Oral QHS   glipiZIDE  5 mg Oral BID AC   hydrochlorothiazide  25 mg Oral q morning   insulin aspart  0-15 Units Subcutaneous TID WC   insulin aspart  0-5 Units Subcutaneous QHS   insulin detemir  10 Units Subcutaneous BID   metFORMIN  1,000 mg Oral BID WC   metoprolol tartrate  25 mg Oral q morning   pantoprazole  40 mg Oral BID AC   polyethylene glycol  17 g Oral Daily   ramipril  20 mg Oral Daily    PRN meds: hydrALAZINE, magnesium citrate, menthol-cetylpyridinium **OR** phenol, methocarbamol **OR** methocarbamol (ROBAXIN) IV, morphine injection, ondansetron **OR** ondansetron (ZOFRAN) IV, oxyCODONE, polyethylene glycol, sorbitol, zolpidem   Infusions:   methocarbamol (ROBAXIN) IV      Antimicrobials: Anti-infectives (From admission, onward)    Start     Dose/Rate Route Frequency Ordered Stop   07/07/22 2100  ceFAZolin (ANCEF) IVPB 2g/100 mL premix        2 g 200 mL/hr over 30 Minutes Intravenous Every 6 hours 07/07/22 1701 07/08/22 0855   07/07/22 0600  ceFAZolin (ANCEF) IVPB 2g/100 mL premix        2 g 200 mL/hr over 30 Minutes Intravenous On call to O.R. 07/06/22 1925 07/07/22 1445       Nutritional status:  Body mass index is 27.37 kg/m.          Objective: Vitals:   07/11/22 0839 07/11/22 1200  BP: (!) 156/58 (!) 141/55  Pulse: 71 (!) 57  Resp: 17   Temp: 98.2 F (36.8 C) 98.3 F (36.8 C)  SpO2: 94% 97%    Intake/Output Summary (Last 24 hours) at 07/11/2022 1252 Last data filed at 07/11/2022 0754 Gross per 24 hour  Intake 100 ml  Output --  Net 100 ml   Filed Weights   07/11/22 0720  Weight: 81.6 kg   Weight change:  Body mass index is 27.37 kg/m.   Physical Exam: General exam: Pleasant, elderly Caucasian male.  Not in distress.  Pain  controlled Skin: No rashes, lesions or ulcers. HEENT: Atraumatic, normocephalic, no obvious bleeding Lungs: Clear to auscultation bilaterally CVS: Regular rate and rhythm, no murmur GI/Abd soft, mild epigastric tenderness continues.  Nontender, nondistended, bowel sound present CNS: Alert awake oriented x 3 Psychiatry: Mood appropriate Extremities: No pedal edema, no calf tenderness  Data Review: I have personally reviewed the laboratory data and studies available.  F/u labs ordered Unresulted Labs (From admission, onward)     Start     Ordered   07/14/22 0500  Creatinine, serum  (enoxaparin (LOVENOX)  CrCl >/= 30 mL/min  )  Weekly,   R  Comments: while on enoxaparin therapy.    07/07/22 1701   07/08/22 0500  CBC  Daily,   R     Comments: For 3 days.    07/07/22 1701            Total time spent in review of labs and imaging, patient evaluation, formulation of plan, documentation and communication with family: 45 minutes  Signed, Lorin Glass, MD [facility] 07/11/2022

## 2022-07-11 NOTE — Progress Notes (Signed)
Physical Therapy Treatment Patient Details Name: Alejandro Foster MRN: [id number] DOB: 02-13-1934 Today's Date: 07/11/2022   History of Present Illness 89 y.o. male admitted 4/18 s/p Lt hip fx and had INTRAMEDULLARY (IM) NAILING OF LEFT FEMUR with PMH significant for DM2, HTN, HLD, CAD/stents, DDD, lumbar spine stenosis, radiculopathy, chronic back pain, peripheral neuropathy, nephrolithiasis. Pt fell while loading his trailer.    PT Comments    Making continued progress towards acute functional goals. Ambulating with min assist up to 18 feet today while using RW. Tolerating some additional weight on LLE. Reviewed additional LE exercises and encouraged to perform regularly between therapy visits to optimize outcomes. Improved power up with transitions into standing from EOB. Wife present during session, very supportive. Pt getting OOB to [facility] with staff safely and he reports this has been a great relief. Patient will continue to benefit from skilled physical therapy services to further improve independence with functional mobility.    Recommendations for follow up therapy are one component of a multi-disciplinary discharge planning process, led by the attending physician.  Recommendations may be updated based on patient status, additional functional criteria and insurance authorization.  Follow Up Recommendations       Assistance Recommended at Discharge Frequent or constant Supervision/Assistance  Patient can return home with the following A lot of help with walking and/or transfers;A little help with bathing/dressing/bathroom;Assistance with cooking/housework;Assist for transportation;Help with stairs or ramp for entrance   Equipment Recommendations  None recommended by PT (TBD next venue)    Recommendations for Other Services Rehab consult     Precautions / Restrictions Precautions Precautions: Fall Restrictions Weight Bearing Restrictions: Yes LLE Weight Bearing: Weight bearing as  tolerated     Mobility  Bed Mobility Overal bed mobility: Needs Assistance Bed Mobility: Supine to Sit     Supine to sit: Min assist, HOB elevated     General bed mobility comments: assist for LLE advancement to EOB and to pull through therapist hand to rise to upright postion. Cues throughout.    Transfers Overall transfer level: Needs assistance Equipment used: Rolling walker (2 wheels) Transfers: Sit to/from Stand Sit to Stand: Min assist, From elevated surface           General transfer comment: Min assist for boost to stand from slightly elevated bed surface. Cues for handplacement, improved power up and stability upon rising. Tolerating increased WB.    Ambulation/Gait Ambulation/Gait assistance: Min assist Gait Distance (Feet): 18 Feet Assistive device: Rolling walker (2 wheels) Gait Pattern/deviations: Step-to pattern, Decreased stride length, Decreased stance time - left, Decreased weight shift to left, Antalgic Gait velocity: decr Gait velocity interpretation: <1.31 ft/sec, indicative of household ambulator   General Gait Details: Cues for sequencing, min assist for RW placement and posterior instability initially but gradually progressed to min guard level towards end of distance with improved control. Step-to pattern, no buckling, increased WB tolerated.   Stairs             Wheelchair Mobility    Modified Rankin (Stroke Patients Only)       Balance Overall balance assessment: Needs assistance Sitting-balance support: No upper extremity supported, Feet supported Sitting balance-Leahy Scale: Fair     Standing balance support: Reliant on assistive device for balance, Single extremity supported Standing balance-Leahy Scale: Fair Standing balance comment: Single UE support at EOB and when reaching back to sit.  Cognition Arousal/Alertness: Awake/alert Behavior During Therapy: WFL for tasks  assessed/performed Overall Cognitive Status: Within Functional Limits for tasks assessed                                          Exercises General Exercises - Lower Extremity Ankle Circles/Pumps: AROM, Both, 10 reps, Seated Quad Sets: Strengthening, Both, 10 reps, Seated Gluteal Sets: Strengthening, Both, 10 reps, Seated Heel Slides: AAROM, Left, 10 reps, Seated Hip ABduction/ADduction: Strengthening, Both, Seated, 10 reps Other Exercises Other Exercises: Modified bridge x10    General Comments        Pertinent Vitals/Pain Pain Assessment Pain Assessment: Faces Faces Pain Scale: Hurts little more Pain Location: L hip when WB Pain Descriptors / Indicators: Aching Pain Intervention(s): Monitored during session, Repositioned    Home Living                          Prior Function            PT Goals (current goals can now be found in the care plan section) Acute Rehab PT Goals Patient Stated Goal: Get stronger at rehab, then go home PT Goal Formulation: With patient Time For Goal Achievement: 07/21/22 Potential to Achieve Goals: Good Progress towards PT goals: Progressing toward goals    Frequency    Min 5X/week      PT Plan Current plan remains appropriate    Co-evaluation              AM-PAC PT "6 Clicks" Mobility   Outcome Measure  Help needed turning from your back to your side while in a flat bed without using bedrails?: A Little Help needed moving from lying on your back to sitting on the side of a flat bed without using bedrails?: A Little Help needed moving to and from a bed to a chair (including a wheelchair)?: A Little Help needed standing up from a chair using your arms (e.g., wheelchair or bedside chair)?: A Little Help needed to walk in hospital room?: A Little Help needed climbing 3-5 steps with a railing? : A Lot 6 Click Score: 17    End of Session Equipment Utilized During Treatment: Gait belt Activity  Tolerance: Patient tolerated treatment well Patient left: in chair;with call bell/phone within reach;with chair alarm set;with family/visitor present;with SCD's reapplied   PT Visit Diagnosis: Other abnormalities of gait and mobility (R26.89);Unsteadiness on feet (R26.81);Muscle weakness (generalized) (M62.81);History of falling (Z91.81);Difficulty in walking, not elsewhere classified (R26.2);Pain Pain - Right/Left: Left Pain - part of body: Hip     Time: 1610-9604 PT Time Calculation (min) (ACUTE ONLY): 15 min  Charges:  $Gait Training: 8-22 mins                     Kathlyn Sacramento, PT, DPT Physical Therapist Acute Rehabilitation Services [facility]    Berton Mount 07/11/2022, 4:05 PM

## 2022-07-12 DIAGNOSIS — S72142A Displaced intertrochanteric fracture of left femur, initial encounter for closed fracture: Secondary | ICD-10-CM | POA: Diagnosis not present

## 2022-07-12 LAB — GLUCOSE, CAPILLARY
Glucose-Capillary: 164 mg/dL — ABNORMAL HIGH (ref 70–99)
Glucose-Capillary: 173 mg/dL — ABNORMAL HIGH (ref 70–99)
Glucose-Capillary: 172 mg/dL — ABNORMAL HIGH (ref 70–99)
Glucose-Capillary: 176 mg/dL — ABNORMAL HIGH (ref 70–99)

## 2022-07-12 MED ORDER — INSULIN DETEMIR 100 UNIT/ML ~~LOC~~ SOLN
12.0000 [IU] | Freq: Two times a day (BID) | SUBCUTANEOUS | Status: DC
  Administered 2022-07-12 – 2022-07-13 (×3): 12 [IU] via SUBCUTANEOUS
  Filled 2022-07-12 (×4): qty 0.12

## 2022-07-12 MED ORDER — ALUM & MAG HYDROXIDE-SIMETH 200-200-20 MG/5ML PO SUSP: 30.0000 mL | Freq: Three times a day (TID) | ORAL | Status: DC | PRN

## 2022-07-12 NOTE — Progress Notes (Signed)
Physical Therapy Treatment Patient Details Name: Alejandro Foster MRN: [id number] DOB: 08-05-1933 Today's Date: 07/12/2022   History of Present Illness 89 y.o. male admitted 4/18 s/p Lt hip fx and had INTRAMEDULLARY (IM) NAILING OF LEFT FEMUR with PMH significant for DM2, HTN, HLD, CAD/stents, DDD, lumbar spine stenosis, radiculopathy, chronic back pain, peripheral neuropathy, nephrolithiasis. Pt fell while loading his trailer.    PT Comments    Pt was received in supine and agreeable to session with wife present. Pt able to improve power up with 4 stand trials, requiring up to min guard. Pt motivated to participate in gait trials, however is limited by dizziness this session requiring 3 seated rest breaks.. Pt relates dizziness to increased pain with LLE WB. BP checked at end of session and was 126/54. Pt and wife reports that the pt has not been eating much recently due to GI problems, but hopefully will be able to eat more today since he is feeling better. Pt continues to benefit from PT services to progress toward functional mobility goals.     Recommendations for follow up therapy are one component of a multi-disciplinary discharge planning process, led by the attending physician.  Recommendations may be updated based on patient status, additional functional criteria and insurance authorization.     Assistance Recommended at Discharge Frequent or constant Supervision/Assistance  Patient can return home with the following A lot of help with walking and/or transfers;A little help with bathing/dressing/bathroom;Assistance with cooking/housework;Assist for transportation;Help with stairs or ramp for entrance   Equipment Recommendations  None recommended by PT (defer to next venue)    Recommendations for Other Services       Precautions / Restrictions Precautions Precautions: Fall Restrictions Weight Bearing Restrictions: Yes LLE Weight Bearing: Weight bearing as tolerated     Mobility   Bed Mobility Overal bed mobility: Needs Assistance Bed Mobility: Supine to Sit     Supine to sit: Min guard     General bed mobility comments: Instruction in use of gait belt for LLE. Pt able to sit EOB with increased time, but no physical assist    Transfers Overall transfer level: Needs assistance Equipment used: Rolling walker (2 wheels) Transfers: Sit to/from Stand Sit to Stand: Min guard           General transfer comment: From EOB x1 and recliner x3 with min guard for safety. Pt demonstrating good recall of safe hand placement.    Ambulation/Gait Ambulation/Gait assistance: Min guard Gait Distance (Feet): 5 Feet (x3) Assistive device: Rolling walker (2 wheels) Gait Pattern/deviations: Step-to pattern, Decreased stride length, Decreased stance time - left, Decreased weight shift to left, Antalgic, Trunk flexed Gait velocity: decr     General Gait Details: Stedy, slow, step-through pattern with min guard for safety. Pt reporting dizziness after each trial and requiring seated rest break.       Balance Overall balance assessment: Needs assistance Sitting-balance support: No upper extremity supported, Feet supported Sitting balance-Leahy Scale: Fair Sitting balance - Comments: sitting EOB   Standing balance support: Reliant on assistive device for balance, Bilateral upper extremity supported, During functional activity Standing balance-Leahy Scale: Poor Standing balance comment: With RW support                            Cognition Arousal/Alertness: Awake/alert Behavior During Therapy: WFL for tasks assessed/performed Overall Cognitive Status: Within Functional Limits for tasks assessed  Exercises      General Comments General comments (skin integrity, edema, etc.): Wife present and supportive throughout      Pertinent Vitals/Pain Pain Assessment Pain Assessment: 0-10 Pain Score: 3   Pain Location: L hip when WB Pain Descriptors / Indicators: Aching, Grimacing, Guarding Pain Intervention(s): Limited activity within patient's tolerance, Monitored during session, Repositioned     PT Goals (current goals can now be found in the care plan section) Acute Rehab PT Goals Patient Stated Goal: Get stronger at rehab, then go home PT Goal Formulation: With patient Time For Goal Achievement: 07/21/22 Potential to Achieve Goals: Good Progress towards PT goals: Progressing toward goals    Frequency    Min 5X/week      PT Plan Current plan remains appropriate       AM-PAC PT "6 Clicks" Mobility   Outcome Measure  Help needed turning from your back to your side while in a flat bed without using bedrails?: A Little Help needed moving from lying on your back to sitting on the side of a flat bed without using bedrails?: A Little Help needed moving to and from a bed to a chair (including a wheelchair)?: A Little Help needed standing up from a chair using your arms (e.g., wheelchair or bedside chair)?: A Little Help needed to walk in hospital room?: A Little Help needed climbing 3-5 steps with a railing? : A Lot 6 Click Score: 17    End of Session Equipment Utilized During Treatment: Gait belt Activity Tolerance: Patient limited by pain;Other (comment) (dizziness) Patient left: in chair;with call bell/phone within reach;with chair alarm set;with family/visitor present Nurse Communication: Mobility status PT Visit Diagnosis: Other abnormalities of gait and mobility (R26.89);Unsteadiness on feet (R26.81);Muscle weakness (generalized) (M62.81);History of falling (Z91.81);Difficulty in walking, not elsewhere classified (R26.2);Pain     Time: 1610-9604 PT Time Calculation (min) (ACUTE ONLY): 34 min  Charges:  $Gait Training: 8-22 mins $Therapeutic Activity: 8-22 mins                     Johny Shock, PTA Acute Rehabilitation Services Secure Chat Preferred   [phone]    Johny Shock 07/12/2022, 11:43 AM

## 2022-07-12 NOTE — Progress Notes (Signed)
PROGRESS NOTE  Alejandro Foster  DOB: 1934/02/12  PCP: Creola Corn, MD JYN:[id number]  DOA: 07/06/2022  LOS: 6 days  Hospital Day: 7  Brief narrative: Alejandro Foster is a 89 y.o. male with PMH significant for DM2, HTN, HLD, CAD/stents, DDD, lumbar spine stenosis, radiculopathy, chronic back pain, peripheral neuropathy, nephrolithiasis who lives at home and does not use any assistive devices to ambulate 4/18, patient was brought to the ED by EMS after a fall at home. Per report, patient was working around his car in his driveway when he tripped and fell hitting his left hip and head, sustained immediate left hip pain and was unable to get up.  Denies LOC  X-rays in the ED showed comminuted displaced and angulated intertrochanteric fracture of the left hip Orthopedic surgery was consulted Admitted to [facility]  Subjective: Patient was seen and examined this morning.   Working with PT.  Feels better.  Epigastric discomfort better.  Complains of diarrhea  Propped up in bed.  Not in distress.  Wife at bedside. Underwent EGD today.  Found to have severe esophagitis.  Assessment and plan: Comminuted Displaced and Angulated Intertrochanteric Fracture of the Left Hip  S/p intramedullary nailing 4/19 - Dr. Roda Shutters Fracture due to mechanical fall Procedure as above. Pain management with Tylenol scheduled, Oxycodone as needed, IV morphine as needed  Currently on Lovenox for DVT prophylaxis PT eval obtained.  CIR recommended.  Insurance denied.  Appeal in process.  Severe esophagitis Patient complained of chronic heartburn and food stuck sensation.  He takes Pepcid PRN Mylanta at home.  His symptoms were especially worse last few days in the hospital.   GI was consulted.  Underwent EGD today 4/23.  It showed severe acute and erosive esophagitis with no bleeding.  He also has erosive gastropathy with no bleeding and no stigmata of recent bleeding.  Biopsy taken.  Because of dysmotility that was noticed in  esophagogram, botulinum toxin was injected. Patient to continue Protonix twice daily for 90 days.  Avoid aspirin and NSAIDs To follow-up with GI as an outpatient.  Biopsy and H. pylori test result. Regular diet resumed. Patient complains of diarrhea which could be due to Mylanta.  I would switch from scheduled to as needed Mylanta.  Monitor bowel movements.  Essential hypertension PTA on metoprolol titrate 25 mg daily, amlodipine 5 mg daily, HCTZ 25 mg daily, ramipril 20 mg daily Currently blood pressure and heart rate remain controlled with home regimen.   CAD/HLD Recent low risk stress test with Dr. Rosemary Holms PTA on aspirin 81 mg daily, statin and fibrate Aspirin remains on hold.  Plan to resume prior to discharge. Continue statin and fibrate  Type 2 diabetes mellitus A1c 7.1 07/07/2022 PTA on Humalog 75/25 twice daily-35 units AM, 22 units p.m. also on glipizide 2.5 mg twice daily and metformin 500 mg twice daily. Currently on basal bolus regimen.  Blood sugar level running elevated. Increase Semglee to 12 units this morning.  Continue glipizide and metformin. Continue sliding scale insulin Accu-Cheks. Continue Neurontin for neuropathy Recent Labs  Lab 07/11/22 0835 07/11/22 1201 07/11/22 1634 07/11/22 2038 07/12/22 0810  GLUCAP 191* 199* 150* 220* 164*    Chronic back pain On Cymbalta 30 mg daily  Overactive bladder Vesicare  Insomnia Ambien nightly  Goals of care   Code Status: Full Code     DVT prophylaxis:  SCDs Start: 07/07/22 1701 Place TED hose Start: 07/07/22 1701 SCDs Start: 07/07/22 0026   Antimicrobials: None Fluid: Not  on IV fluid Consultants: Orthopedics Family Communication: Wife at bedside  Status: Inpatient Level of care:  Telemetry Medical   Needs to continue in-hospital care:  Pending CIR.   Patient from: Home Anticipated d/c to: Pending CIR.  Appeal in process   Diet:  Diet Order             Diet regular Room service  appropriate? Yes; Fluid consistency: Thin  Diet effective now                   Scheduled Meds:  acetaminophen  1,000 mg Oral TID   amLODipine  5 mg Oral QPM   atorvastatin  20 mg Oral QPM   docusate sodium  100 mg Oral BID   DULoxetine  30 mg Oral Daily   fesoterodine  4 mg Oral Daily   gabapentin  100 mg Oral BID   And   gabapentin  200 mg Oral QHS   glipiZIDE  5 mg Oral BID AC   hydrochlorothiazide  25 mg Oral q morning   insulin aspart  0-15 Units Subcutaneous TID WC   insulin aspart  0-5 Units Subcutaneous QHS   insulin detemir  12 Units Subcutaneous BID   metFORMIN  1,000 mg Oral BID WC   metoprolol tartrate  25 mg Oral q morning   pantoprazole  40 mg Oral BID AC   polyethylene glycol  17 g Oral Daily   ramipril  20 mg Oral Daily    PRN meds: alum & mag hydroxide-simeth, hydrALAZINE, magnesium citrate, menthol-cetylpyridinium **OR** phenol, methocarbamol **OR** methocarbamol (ROBAXIN) IV, morphine injection, ondansetron **OR** ondansetron (ZOFRAN) IV, oxyCODONE, polyethylene glycol, sorbitol, zolpidem   Infusions:   methocarbamol (ROBAXIN) IV      Antimicrobials: Anti-infectives (From admission, onward)    Start     Dose/Rate Route Frequency Ordered Stop   07/07/22 2100  ceFAZolin (ANCEF) IVPB 2g/100 mL premix        2 g 200 mL/hr over 30 Minutes Intravenous Every 6 hours 07/07/22 1701 07/08/22 0855   07/07/22 0600  ceFAZolin (ANCEF) IVPB 2g/100 mL premix        2 g 200 mL/hr over 30 Minutes Intravenous On call to O.R. 07/06/22 1925 07/07/22 1445       Nutritional status:  Body mass index is 27.37 kg/m.          Objective: Vitals:   07/12/22 0809 07/12/22 0910  BP: (!) 148/86   Pulse: (!) 41 73  Resp: 16   Temp: 97.9 F (36.6 C)   SpO2: 96%     Intake/Output Summary (Last 24 hours) at 07/12/2022 1145 Last data filed at 07/11/2022 1700 Gross per 24 hour  Intake 120 ml  Output --  Net 120 ml    Filed Weights   07/11/22 0720  Weight:  81.6 kg   Weight change:  Body mass index is 27.37 kg/m.   Physical Exam: General exam: Pleasant, elderly Caucasian male.  Not in distress.  Pain controlled Skin: No rashes, lesions or ulcers. HEENT: Atraumatic, normocephalic, no obvious bleeding Lungs: Clear to auscultation bilaterally CVS: Regular rate and rhythm, no murmur GI/Abd soft, epigastric tenderness improved.  Nontender, nondistended, bowel sound present CNS: Alert awake oriented x 3 Psychiatry: Mood appropriate Extremities: No pedal edema, no calf tenderness  Data Review: I have personally reviewed the laboratory data and studies available.  F/u labs ordered Unresulted Labs (From admission, onward)     Start     Ordered   07/14/22  0500  Creatinine, serum  (enoxaparin (LOVENOX)  CrCl >/= 30 mL/min  )  Weekly,   R     Comments: while on enoxaparin therapy.    07/07/22 1701   07/08/22 0500  CBC  Daily,   R     Comments: For 3 days.    07/07/22 1701            Total time spent in review of labs and imaging, patient evaluation, formulation of plan, documentation and communication with family: 45 minutes  Signed, Lorin Glass, MD [facility] 07/12/2022

## 2022-07-12 NOTE — Anesthesia Postprocedure Evaluation (Signed)
Anesthesia Post Note  Patient: Alejandro Foster  Procedure(s) Performed: ESOPHAGOGASTRODUODENOSCOPY (EGD) WITH PROPOFOL BIOPSY     Patient location during evaluation: PACU Anesthesia Type: MAC Level of consciousness: awake and alert Pain management: pain level controlled Vital Signs Assessment: post-procedure vital signs reviewed and stable Respiratory status: spontaneous breathing, nonlabored ventilation, respiratory function stable and patient connected to nasal cannula oxygen Cardiovascular status: stable and blood pressure returned to baseline Postop Assessment: no apparent nausea or vomiting Anesthetic complications: no   No notable events documented.  Last Vitals:  Vitals:   07/11/22 2040 07/12/22 0400  BP: (!) 140/60 135/75  Pulse: (!) 58 64  Resp:  18  Temp: 36.5 C 36.7 C  SpO2: 94% 96%    Last Pain:  Vitals:   07/12/22 0400  TempSrc: Oral  PainSc:                  Edan Serratore

## 2022-07-13 DIAGNOSIS — S72142A Displaced intertrochanteric fracture of left femur, initial encounter for closed fracture: Secondary | ICD-10-CM | POA: Diagnosis not present

## 2022-07-13 LAB — GLUCOSE, CAPILLARY: Glucose-Capillary: 164 mg/dL — ABNORMAL HIGH (ref 70–99)

## 2022-07-13 LAB — SURGICAL PATHOLOGY

## 2022-07-13 MED ORDER — INSULIN DETEMIR 100 UNIT/ML ~~LOC~~ SOLN
15.0000 [IU] | Freq: Two times a day (BID) | SUBCUTANEOUS | Status: DC
  Administered 2022-07-13 – 2022-07-18 (×10): 15 [IU] via SUBCUTANEOUS
  Filled 2022-07-13 (×11): qty 0.15

## 2022-07-13 NOTE — Progress Notes (Signed)
Physical Therapy Treatment Patient Details Name: Alejandro Foster MRN: [id number] DOB: [birth year] Today's Date: 07/13/2022   History of Present Illness 89 y.o. male admitted 4/18 s/p Lt hip fx and had INTRAMEDULLARY (IM) NAILING OF LEFT FEMUR with PMH significant for DM2, HTN, HLD, CAD/stents, DDD, lumbar spine stenosis, radiculopathy, chronic back pain, peripheral neuropathy, nephrolithiasis. Pt fell while loading his trailer.    PT Comments    Pt received in supine and agreeable to session. Pt reports no dizziness during ambulation and improved food intake since yesterday. Pt able to tolerate increased gait distance this session, however continues to be limited by decreased activity tolerance and LLE pain. Pt demonstrating difficulty with L hip flexion during marching and heel slides due to pain, however pt is able to perform heel slides with a belt. Pt encouraged to perform exercises between sessions to increase strength. Pt continues to benefit from PT services to progress toward functional mobility goals.    Recommendations for follow up therapy are one component of a multi-disciplinary discharge planning process, led by the attending physician.  Recommendations may be updated based on patient status, additional functional criteria and insurance authorization.     Assistance Recommended at Discharge Frequent or constant Supervision/Assistance  Patient can return home with the following A lot of help with walking and/or transfers;A little help with bathing/dressing/bathroom;Assistance with cooking/housework;Assist for transportation;Help with stairs or ramp for entrance   Equipment Recommendations  None recommended by PT    Recommendations for Other Services       Precautions / Restrictions Precautions Precautions: Fall Restrictions Weight Bearing Restrictions: Yes LLE Weight Bearing: Weight bearing as tolerated     Mobility  Bed Mobility Overal bed mobility: Needs Assistance Bed  Mobility: Supine to Sit     Supine to sit: Supervision     General bed mobility comments: Use of gait belt for LLE advancement. Cues for technique, but no physical assist needed    Transfers Overall transfer level: Needs assistance Equipment used: Rolling walker (2 wheels) Transfers: Sit to/from Stand Sit to Stand: Min guard           General transfer comment: From EOB with slow power up and min guard for safety    Ambulation/Gait Ambulation/Gait assistance: Min guard Gait Distance (Feet): 15 Feet Assistive device: Rolling walker (2 wheels) Gait Pattern/deviations: Step-to pattern, Decreased stride length, Decreased stance time - left, Decreased weight shift to left, Antalgic, Trunk flexed     Pre-gait activities: weight shifting at EOB General Gait Details: Slow step-to pattern with heavy reliance on BUEs to offload LLE.      Balance Overall balance assessment: Needs assistance Sitting-balance support: No upper extremity supported, Feet supported Sitting balance-Leahy Scale: Good Sitting balance - Comments: sitting EOB   Standing balance support: Reliant on assistive device for balance, Bilateral upper extremity supported, During functional activity Standing balance-Leahy Scale: Poor Standing balance comment: With RW support                            Cognition Arousal/Alertness: Awake/alert Behavior During Therapy: WFL for tasks assessed/performed Overall Cognitive Status: Within Functional Limits for tasks assessed                                          Exercises General Exercises - Lower Extremity Quad Sets: Strengthening, Both, Seated, 5 reps Heel  Slides: AAROM, Left, 10 reps, Seated, AROM    General Comments        Pertinent Vitals/Pain Pain Assessment Pain Assessment: 0-10 Pain Score: 4  Pain Location: L hip Pain Descriptors / Indicators: Aching, Grimacing, Guarding Pain Intervention(s): Monitored during session      PT Goals (current goals can now be found in the care plan section) Acute Rehab PT Goals Patient Stated Goal: Get stronger at rehab, then go home PT Goal Formulation: With patient Time For Goal Achievement: 07/21/22 Potential to Achieve Goals: Good Progress towards PT goals: Progressing toward goals    Frequency    Min 5X/week      PT Plan Current plan remains appropriate       AM-PAC PT "6 Clicks" Mobility   Outcome Measure  Help needed turning from your back to your side while in a flat bed without using bedrails?: A Little Help needed moving from lying on your back to sitting on the side of a flat bed without using bedrails?: A Little Help needed moving to and from a bed to a chair (including a wheelchair)?: A Little Help needed standing up from a chair using your arms (e.g., wheelchair or bedside chair)?: A Little Help needed to walk in hospital room?: A Little Help needed climbing 3-5 steps with a railing? : A Lot 6 Click Score: 17    End of Session Equipment Utilized During Treatment: Gait belt Activity Tolerance: Patient tolerated treatment well Patient left: in chair;with call bell/phone within reach;with chair alarm set;with family/visitor present Nurse Communication: Mobility status PT Visit Diagnosis: Other abnormalities of gait and mobility (R26.89);Unsteadiness on feet (R26.81);Muscle weakness (generalized) (M62.81);History of falling (Z91.81);Difficulty in walking, not elsewhere classified (R26.2);Pain     Time: 1610-9604 PT Time Calculation (min) (ACUTE ONLY): 18 min  Charges:  $Gait Training: 8-22 mins                     Johny Shock, PTA Acute Rehabilitation Services Secure Chat Preferred  [phone]    Johny Shock 07/13/2022, 1:20 PM

## 2022-07-13 NOTE — Progress Notes (Signed)
PROGRESS NOTE  [name]  DOB: 05-Feb-1934  PCP: Alejandro Corn, MD ZOX:[id number]  DOA: 07/06/2022  LOS: 7 days  Hospital Day: 8  Brief narrative: Alejandro Foster is a 89 y.o. male with PMH significant for DM2, HTN, HLD, CAD/stents, DDD, lumbar spine stenosis, radiculopathy, chronic back pain, peripheral neuropathy, nephrolithiasis who lives at home and does not use any assistive devices to ambulate 4/18, patient was brought to the ED by EMS after a fall at home. Per report, patient was working around his car in his driveway when he tripped and fell hitting his left hip and head, sustained immediate left hip pain and was unable to get up.  Denies LOC  X-rays in the ED showed comminuted displaced and angulated intertrochanteric fracture of the left hip Orthopedic surgery was consulted Admitted to [facility]  Subjective: Patient was seen and examined this morning.   Sitting up in bed.  Not in distress.  No new symptoms.  Diarrhea is slowing down.  Hemodynamically stable.  Epigastric pain improving.  Pending CIR  Assessment and plan: Comminuted Displaced and Angulated Intertrochanteric Fracture of the Left Hip  S/p intramedullary nailing 4/19 - Dr. Roda Shutters Fracture due to mechanical fall Procedure as above. Pain management with Tylenol scheduled, Oxycodone as needed, IV morphine as needed  Currently on Lovenox for DVT prophylaxis PT eval obtained.  CIR recommended.  Insurance denied.  Appeal in process.  Severe esophagitis Patient complained of chronic heartburn and food stuck sensation.  He takes Pepcid PRN Mylanta at home.  His symptoms were especially worse last few days in the hospital.   GI was consulted.  Underwent EGD today 4/23.  It showed severe acute and erosive esophagitis with no bleeding.  He also has erosive gastropathy with no bleeding and no stigmata of recent bleeding.  Biopsy taken.  Because of dysmotility that was noticed in esophagogram, botulinum toxin was injected. Patient to  continue Protonix twice daily for 90 days.  Avoid aspirin and NSAIDs To follow-up with GI as an outpatient.  Biopsy and H. pylori test result. Regular diet resumed.  Epigastric pain improving.  Diarrhea slowed down after Mylanta was switched from scheduled to as needed.  Essential hypertension PTA on metoprolol titrate 25 mg daily, amlodipine 5 mg daily, HCTZ 25 mg daily, ramipril 20 mg daily Currently blood pressure and heart rate remain controlled with home regimen.   CAD/HLD Recent low risk stress test with Dr. Rosemary Holms PTA on aspirin 81 mg daily, statin and fibrate Aspirin remains on hold.  Plan to resume prior to discharge. Continue statin and fibrate  Type 2 diabetes mellitus A1c 7.1 07/07/2022 PTA on Humalog 75/25 twice daily-35 units AM, 22 units p.m. also on glipizide 2.5 mg twice daily and metformin 500 mg twice daily. Currently on basal bolus regimen.   Increase Semglee to 15 units from tonight..  Continue glipizide and metformin. Continue sliding scale insulin Accu-Cheks. Continue Neurontin for neuropathy Recent Labs  Lab 07/11/22 2038 07/12/22 0810 07/12/22 1214 07/12/22 1658 07/12/22 2107  GLUCAP 220* 164* 173* 172* 176*    Chronic back pain On Cymbalta 30 mg daily  Overactive bladder Vesicare  Insomnia Ambien nightly  Goals of care   Code Status: Full Code     DVT prophylaxis:  SCDs Start: 07/07/22 1701 Place TED hose Start: 07/07/22 1701 SCDs Start: 07/07/22 0026   Antimicrobials: None Fluid: Not on IV fluid Consultants: Orthopedics Family Communication: Wife not at bedside today.  Status: Inpatient Level of care:  Telemetry  Medical   Needs to continue in-hospital care:  Pending CIR.   Patient from: Home Anticipated d/c to: Pending CIR.  Appeal in process   Diet:  Diet Order             Diet regular Room service appropriate? Yes; Fluid consistency: Thin  Diet effective now                   Scheduled Meds:  acetaminophen   1,000 mg Oral TID   amLODipine  5 mg Oral QPM   atorvastatin  20 mg Oral QPM   docusate sodium  100 mg Oral BID   DULoxetine  30 mg Oral Daily   fesoterodine  4 mg Oral Daily   gabapentin  100 mg Oral BID   And   gabapentin  200 mg Oral QHS   glipiZIDE  5 mg Oral BID AC   hydrochlorothiazide  25 mg Oral q morning   insulin aspart  0-15 Units Subcutaneous TID WC   insulin aspart  0-5 Units Subcutaneous QHS   insulin detemir  12 Units Subcutaneous BID   metFORMIN  1,000 mg Oral BID WC   metoprolol tartrate  25 mg Oral q morning   pantoprazole  40 mg Oral BID AC   polyethylene glycol  17 g Oral Daily   ramipril  20 mg Oral Daily    PRN meds: alum & mag hydroxide-simeth, hydrALAZINE, magnesium citrate, menthol-cetylpyridinium **OR** phenol, methocarbamol **OR** methocarbamol (ROBAXIN) IV, morphine injection, ondansetron **OR** ondansetron (ZOFRAN) IV, oxyCODONE, polyethylene glycol, sorbitol, zolpidem   Infusions:   methocarbamol (ROBAXIN) IV      Antimicrobials: Anti-infectives (From admission, onward)    Start     Dose/Rate Route Frequency Ordered Stop   07/07/22 2100  ceFAZolin (ANCEF) IVPB 2g/100 mL premix        2 g 200 mL/hr over 30 Minutes Intravenous Every 6 hours 07/07/22 1701 07/08/22 0855   07/07/22 0600  ceFAZolin (ANCEF) IVPB 2g/100 mL premix        2 g 200 mL/hr over 30 Minutes Intravenous On call to O.R. 07/06/22 1925 07/07/22 1445       Nutritional status:  Body mass index is 27.37 kg/m.          Objective: Vitals:   07/13/22 0357 07/13/22 0835  BP: 138/61 (!) 133/58  Pulse: 76 82  Resp: 17 16  Temp: 98 F (36.7 C) 98.4 F (36.9 C)  SpO2: 97% 97%    Intake/Output Summary (Last 24 hours) at 07/13/2022 1029 Last data filed at 07/13/2022 0838 Gross per 24 hour  Intake 710 ml  Output --  Net 710 ml    Filed Weights   07/11/22 0720  Weight: 81.6 kg   Weight change:  Body mass index is 27.37 kg/m.   Physical Exam: General exam:  Pleasant, elderly Caucasian male.  Not in distress.  Pain controlled Skin: No rashes, lesions or ulcers. HEENT: Atraumatic, normocephalic, no obvious bleeding Lungs: Clear to auscultation bilaterally CVS: Regular rate and rhythm, no murmur GI/Abd soft, epigastric tenderness improved.  Nontender, nondistended, bowel sound present CNS: Alert awake oriented x 3 Psychiatry: Mood appropriate Extremities: No pedal edema, no calf tenderness  Data Review: I have personally reviewed the laboratory data and studies available.  F/u labs ordered Unresulted Labs (From admission, onward)     Start     Ordered   07/14/22 0500  Creatinine, serum  (enoxaparin (LOVENOX)  CrCl >/= 30 mL/min  )  Weekly,  R     Comments: while on enoxaparin therapy.    07/07/22 1701   07/14/22 0500  CBC with Differential/Platelet  Tomorrow morning,   R        07/13/22 0820   07/14/22 0500  Basic metabolic panel  Tomorrow morning,   R        07/13/22 0820            Total time spent in review of labs and imaging, patient evaluation, formulation of plan, documentation and communication with family: 45 minutes  Signed, Lorin Glass, MD [facility] 07/13/2022

## 2022-07-13 NOTE — Progress Notes (Signed)
Inpatient Rehabilitation Admissions Coordinator   I continue to await insurance appeal determination for a possible CIR admit. I met at bedside with patient and his wife to answer their questions.  Ottie Glazier, RN, MSN Rehab Admissions Coordinator [phone] 07/13/2022 12:57 PM

## 2022-07-13 NOTE — Progress Notes (Signed)
Occupational Therapy Treatment Patient Details Name: Alejandro Foster MRN: [id number] DOB: [birth year] Today's Date: 07/13/2022   History of present illness 89 y.o. male admitted 4/18 s/p Lt hip fx and had INTRAMEDULLARY (IM) NAILING OF LEFT FEMUR with PMH significant for DM2, HTN, HLD, CAD/stents, DDD, lumbar spine stenosis, radiculopathy, chronic back pain, peripheral neuropathy, nephrolithiasis. Pt fell while loading his trailer.   OT comments  Pt making good progress with functional goals. Pt eager to d/c to post acute rehab setting. Pt able to sit EOB using sheet to assist L LE without physical assist. Pt sat EOB for simulated LB bathing tasks, sit - stand from bed - RW min guard to walk to chair. OT will continue to follow acutely to maximize level of function and safety   Recommendations for follow up therapy are one component of a multi-disciplinary discharge planning process, led by the attending physician.  Recommendations may be updated based on patient status, additional functional criteria and insurance authorization.    Assistance Recommended at Discharge Intermittent Supervision/Assistance  Patient can return home with the following  A little help with walking and/or transfers;A lot of help with bathing/dressing/bathroom;Assistance with cooking/housework;Assist for transportation;Help with stairs or ramp for entrance   Equipment Recommendations  BSC/3in1;Other (comment) (RW)    Recommendations for Other Services      Precautions / Restrictions Precautions Precautions: Fall Restrictions Weight Bearing Restrictions: Yes LLE Weight Bearing: Weight bearing as tolerated       Mobility Bed Mobility Overal bed mobility: Needs Assistance Bed Mobility: Supine to Sit     Supine to sit: Min guard          Transfers Overall transfer level: Needs assistance Equipment used: Rolling walker (2 wheels) Transfers: Sit to/from Stand Sit to Stand: Min guard                  Balance Overall balance assessment: Needs assistance Sitting-balance support: No upper extremity supported, Feet supported Sitting balance-Leahy Scale: Good     Standing balance support: Reliant on assistive device for balance, Bilateral upper extremity supported, During functional activity Standing balance-Leahy Scale: Poor                             ADL either performed or assessed with clinical judgement   ADL Overall ADL's : Needs assistance/impaired             Lower Body Bathing: Moderate assistance;Sitting/lateral leans Lower Body Bathing Details (indicate cue type and reason): simulated seated EOB         Toilet Transfer: Minimal assistance;Min guard;Rolling walker (2 wheels);Ambulation Toilet Transfer Details (indicate cue type and reason): simulated to chair Toileting- Clothing Manipulation and Hygiene: Minimal assistance;Sit to/from stand       Functional mobility during ADLs: Minimal assistance;Min guard;Rolling walker (2 wheels)      Extremity/Trunk Assessment Upper Extremity Assessment Upper Extremity Assessment: Overall WFL for tasks assessed   Lower Extremity Assessment Lower Extremity Assessment: Defer to PT evaluation        Vision Baseline Vision/History: 1 Wears glasses Ability to See in Adequate Light: 0 Adequate Patient Visual Report: No change from baseline     Perception     Praxis      Cognition Arousal/Alertness: Awake/alert Behavior During Therapy: WFL for tasks assessed/performed Overall Cognitive Status: Within Functional Limits for tasks assessed  Exercises      Shoulder Instructions       General Comments      Pertinent Vitals/ Pain       Pain Assessment Pain Assessment: 0-10 Pain Score: 3  Pain Location: L hip Pain Descriptors / Indicators: Aching, Grimacing, Guarding Pain Intervention(s): Monitored during session, Repositioned  Home  Living                                          Prior Functioning/Environment              Frequency  Min 2X/week        Progress Toward Goals  OT Goals(current goals can now be found in the care plan section)  Progress towards OT goals: Progressing toward goals     Plan Discharge plan needs to be updated    Co-evaluation                 AM-PAC OT "6 Clicks" Daily Activity     Outcome Measure   Help from another person eating meals?: None Help from another person taking care of personal grooming?: A Little Help from another person toileting, which includes using toliet, bedpan, or urinal?: A Little Help from another person bathing (including washing, rinsing, drying)?: A Lot Help from another person to put on and taking off regular upper body clothing?: A Little Help from another person to put on and taking off regular lower body clothing?: A Lot 6 Click Score: 17    End of Session Equipment Utilized During Treatment: Rolling walker (2 wheels);Gait belt  OT Visit Diagnosis: Unsteadiness on feet (R26.81);Pain Pain - Right/Left: Left Pain - part of body: Hip   Activity Tolerance Patient tolerated treatment well   Patient Left in chair;with call bell/phone within reach;with chair alarm set   Nurse Communication          Time: 4098-1191 OT Time Calculation (min): 24 min  Charges: OT General Charges $OT Visit: 1 Visit OT Treatments $Self Care/Home Management : 8-22 mins $Therapeutic Activity: 8-22 mins   Galen Manila 07/13/2022, 1:11 PM

## 2022-07-14 DIAGNOSIS — S72142A Displaced intertrochanteric fracture of left femur, initial encounter for closed fracture: Secondary | ICD-10-CM | POA: Diagnosis not present

## 2022-07-14 LAB — BASIC METABOLIC PANEL
Anion gap: 15 (ref 5–15)
BUN: 18 mg/dL (ref 8–23)
CO2: 26 mmol/L (ref 22–32)
Calcium: 9.6 mg/dL (ref 8.9–10.3)
Chloride: 96 mmol/L — ABNORMAL LOW (ref 98–111)
Creatinine, Ser: 0.85 mg/dL (ref 0.61–1.24)
GFR, Estimated: >60 mL/min (ref >60)
Glucose, Bld: 189 mg/dL — ABNORMAL HIGH (ref 70–99)
Potassium: 3 mmol/L — ABNORMAL LOW (ref 3.5–5.1)
Sodium: 137 mmol/L (ref 135–145)

## 2022-07-14 LAB — CBC WITH DIFFERENTIAL/PLATELET
Abs Immature Granulocytes: 0.07 10*3/uL (ref 0.00–0.07)
Basophils Absolute: 0.1 10*3/uL (ref 0.0–0.1)
Basophils Relative: 1 %
Eosinophils Absolute: 0.4 10*3/uL (ref 0.0–0.5)
Eosinophils Relative: 3 %
HCT: 36.9 % — ABNORMAL LOW (ref 39.0–52.0)
Hemoglobin: 12.7 g/dL — ABNORMAL LOW (ref 13.0–17.0)
Immature Granulocytes: 1 %
Lymphocytes Relative: 24 %
Lymphs Abs: 3 10*3/uL (ref 0.7–4.0)
MCH: 28.9 pg (ref 26.0–34.0)
MCHC: 34.4 g/dL (ref 30.0–36.0)
MCV: 83.9 fL (ref 80.0–100.0)
Monocytes Absolute: 1.7 10*3/uL — ABNORMAL HIGH (ref 0.1–1.0)
Monocytes Relative: 13 %
Neutro Abs: 7.2 10*3/uL (ref 1.7–7.7)
Neutrophils Relative %: 58 %
Platelets: 567 10*3/uL — ABNORMAL HIGH (ref 150–400)
RBC: 4.4 MIL/uL (ref 4.22–5.81)
RDW: 14.2 % (ref 11.5–15.5)
WBC: 12.5 10*3/uL — ABNORMAL HIGH (ref 4.0–10.5)
nRBC: 0 % (ref 0.0–0.2)

## 2022-07-14 LAB — GLUCOSE, CAPILLARY
Glucose-Capillary: 230 mg/dL — ABNORMAL HIGH (ref 70–99)
Glucose-Capillary: 206 mg/dL — ABNORMAL HIGH (ref 70–99)
Glucose-Capillary: 109 mg/dL — ABNORMAL HIGH (ref 70–99)
Glucose-Capillary: 132 mg/dL — ABNORMAL HIGH (ref 70–99)

## 2022-07-14 MED ORDER — ENOXAPARIN SODIUM 40 MG/0.4ML IJ SOSY
40.0000 mg | PREFILLED_SYRINGE | INTRAMUSCULAR | Status: DC
  Administered 2022-07-14 – 2022-07-18 (×5): 40 mg via SUBCUTANEOUS
  Filled 2022-07-14 (×5): qty 0.4

## 2022-07-14 MED ORDER — ASPIRIN 81 MG PO CHEW
81.0000 mg | CHEWABLE_TABLET | Freq: Every day | ORAL | Status: DC
  Administered 2022-07-14 – 2022-07-18 (×5): 81 mg via ORAL
  Filled 2022-07-14 (×5): qty 1

## 2022-07-14 MED ORDER — POTASSIUM CHLORIDE CRYS ER 20 MEQ PO TBCR
40.0000 meq | EXTENDED_RELEASE_TABLET | Freq: Once | ORAL | Status: AC
  Administered 2022-07-14: 40 meq via ORAL
  Filled 2022-07-14: qty 2

## 2022-07-14 NOTE — Progress Notes (Signed)
Physical Therapy Treatment Patient Details Name: Alejandro Foster MRN: [id number] DOB: Sep 26, 1933 Today's Date: 07/14/2022   History of Present Illness 89 y.o. male admitted 4/18 s/p Lt hip fx and had INTRAMEDULLARY (IM) NAILING OF LEFT FEMUR with PMH significant for DM2, HTN, HLD, CAD/stents, DDD, lumbar spine stenosis, radiculopathy, chronic back pain, peripheral neuropathy, nephrolithiasis. Pt fell while loading his trailer.    PT Comments    Pt was received sitting in recliner and agreeable to session with wife present throughout. Pt reporting performance of exercises since last session and improvement in LLE strength. Pt demonstrating improved stability with power up this session. Pt able to tolerate increased gait distance this session with focus on LLE toe off and knee flexion. Pt able to improve, however reports slightly increased pain. Pt requiring 2 seated rest breaks due to pain and fatigue. Pt continues to benefit from PT services to progress toward functional mobility goals.     Recommendations for follow up therapy are one component of a multi-disciplinary discharge planning process, led by the attending physician.  Recommendations may be updated based on patient status, additional functional criteria and insurance authorization.     Assistance Recommended at Discharge Frequent or constant Supervision/Assistance  Patient can return home with the following A lot of help with walking and/or transfers;A little help with bathing/dressing/bathroom;Assistance with cooking/housework;Assist for transportation;Help with stairs or ramp for entrance   Equipment Recommendations  None recommended by PT    Recommendations for Other Services Rehab consult     Precautions / Restrictions Precautions Precautions: Fall Restrictions Weight Bearing Restrictions: Yes LLE Weight Bearing: Weight bearing as tolerated     Mobility  Bed Mobility               General bed mobility comments: Pt  beginning and ending session in recliner    Transfers Overall transfer level: Needs assistance Equipment used: Rolling walker (2 wheels) Transfers: Sit to/from Stand Sit to Stand: Min guard           General transfer comment: From recliner x3 with min guard for safety. Pt demonstrating good recall of safe hand placement    Ambulation/Gait Ambulation/Gait assistance: Min guard Gait Distance (Feet): 40 Feet Assistive device: Rolling walker (2 wheels) Gait Pattern/deviations: Step-to pattern, Decreased stride length, Decreased stance time - left, Decreased weight shift to left, Antalgic, Trunk flexed, Step-through pattern Gait velocity: decr     General Gait Details: Slow step-to progressing to slight step-through pattern with cues. Pt demonstrating improved cadence, but continues to pause between steps due to pain. Cues for improved LLE toe off and knee flexion with pt able to improve with increased effort. Pt requiring 2 seated rest breaks due to fatigue and "pressure in eyes and forehead" from the pain per pt report.       Balance Overall balance assessment: Needs assistance Sitting-balance support: No upper extremity supported, Feet supported Sitting balance-Leahy Scale: Good Sitting balance - Comments: sitting EOB   Standing balance support: Reliant on assistive device for balance, Bilateral upper extremity supported, During functional activity Standing balance-Leahy Scale: Poor Standing balance comment: With RW support                            Cognition Arousal/Alertness: Awake/alert Behavior During Therapy: WFL for tasks assessed/performed Overall Cognitive Status: Within Functional Limits for tasks assessed  Exercises      General Comments        Pertinent Vitals/Pain Pain Assessment Pain Assessment: 0-10 Pain Score: 4  Pain Location: L hip Pain Descriptors / Indicators: Aching,  Grimacing, Guarding Pain Intervention(s): Monitored during session, Repositioned     PT Goals (current goals can now be found in the care plan section) Acute Rehab PT Goals Patient Stated Goal: Get stronger at rehab, then go home PT Goal Formulation: With patient Time For Goal Achievement: 07/21/22 Potential to Achieve Goals: Good Progress towards PT goals: Progressing toward goals    Frequency    Min 5X/week      PT Plan Current plan remains appropriate       AM-PAC PT "6 Clicks" Mobility   Outcome Measure  Help needed turning from your back to your side while in a flat bed without using bedrails?: A Little Help needed moving from lying on your back to sitting on the side of a flat bed without using bedrails?: A Little Help needed moving to and from a bed to a chair (including a wheelchair)?: A Little Help needed standing up from a chair using your arms (e.g., wheelchair or bedside chair)?: A Little Help needed to walk in hospital room?: A Little Help needed climbing 3-5 steps with a railing? : A Lot 6 Click Score: 17    End of Session Equipment Utilized During Treatment: Gait belt Activity Tolerance: Patient tolerated treatment well Patient left: in chair;with call bell/phone within reach;with chair alarm set;with family/visitor present Nurse Communication: Mobility status PT Visit Diagnosis: Other abnormalities of gait and mobility (R26.89);Unsteadiness on feet (R26.81);Muscle weakness (generalized) (M62.81);History of falling (Z91.81);Difficulty in walking, not elsewhere classified (R26.2);Pain     Time: 1610-9604 PT Time Calculation (min) (ACUTE ONLY): 23 min  Charges:  $Gait Training: 23-37 mins                     Johny Shock, PTA Acute Rehabilitation Services Secure Chat Preferred  [phone]    Johny Shock 07/14/2022, 1:36 PM

## 2022-07-14 NOTE — Progress Notes (Signed)
PROGRESS NOTE  Alejandro Foster  DOB: Aug 11, 1933  PCP: Creola Corn, MD ZOX:[id number]  DOA: 07/06/2022  LOS: 8 days  Hospital Day: 9  Brief narrative: Alejandro Foster is a 89 y.o. male with PMH significant for DM2, HTN, HLD, CAD/stents, DDD, lumbar spine stenosis, radiculopathy, chronic back pain, peripheral neuropathy, nephrolithiasis who lives at home and does not use any assistive devices to ambulate. 4/18, patient was brought to the ED by EMS after a fall at home. Per report, patient was working around his car in his driveway when he tripped and fell hitting his left hip and head, sustained immediate left hip pain and was unable to get up.  Denies LOC  X-rays in the ED showed comminuted displaced and angulated intertrochanteric fracture of the left hip Orthopedic surgery was consulted Admitted to [facility] 4/19, underwent intramedullary nailing by Dr. Roda Shutters Currently pending CIR.  Subjective: Patient was seen and examined this morning.   Propped up in bed.  Wife at bedside. Patient complains of liquidy stool, 2-3 bowel movements in last 24 hours.  Assessment and plan: Comminuted Displaced and Angulated Intertrochanteric Fracture of the Left Hip  S/p intramedullary nailing 4/19 - Dr. Roda Shutters Fracture due to mechanical fall Procedure as above. Pain management with Tylenol scheduled, Oxycodone as needed, IV morphine as needed  Currently on Lovenox for DVT prophylaxis PT eval obtained.  CIR recommended.  Insurance denied.  Appeal in process.  Severe esophagitis Patient complained of chronic heartburn and food stuck sensation.  He takes Pepcid PRN Mylanta at home.  His symptoms were especially worse last few days in the hospital.   GI was consulted.  Underwent EGD today 4/23.  It showed severe acute and erosive esophagitis with no bleeding.  He also has erosive gastropathy with no bleeding and no stigmata of recent bleeding.  Biopsy taken.  Because of dysmotility that was noticed in esophagogram,  botulinum toxin was injected. Patient to continue Protonix twice daily for 90 days.  Continue Mylanta as needed.  Avoid aspirin and NSAIDs To follow-up with GI as an outpatient.  Biopsy and H. pylori test result. Hemoglobin stable. Recent Labs    07/06/22 1215 07/07/22 0049 07/08/22 0049 07/09/22 0341 07/14/22 0720  HGB 12.7* 11.8* 10.8* 9.8* 12.7*  MCV 88.8 85.8 88.2 87.4 83.9    Diarrhea 2-3 episodes of loose bowel movement in last 24 hours.  Stop scheduled Senokot and MiraLAX. Continue to monitor.  Hypokalemia Potassium low at 3 today.  Replacement given Recent Labs  Lab 07/08/22 0049 07/09/22 0341 07/14/22 0720  K 4.0 3.3* 3.0*   Essential hypertension PTA on metoprolol titrate 25 mg daily, amlodipine 5 mg daily, HCTZ 25 mg daily, ramipril 20 mg daily Currently blood pressure and heart rate remain controlled with home regimen.   CAD/HLD Recent low risk stress test with Dr. Rosemary Holms PTA on aspirin 81 mg daily, statin and fibrate Continue all.  Type 2 diabetes mellitus A1c 7.1 07/07/2022 PTA on Humalog 75/25 twice daily-35 units AM, 22 units p.m. also on glipizide 2.5 mg twice daily and metformin 500 mg twice daily. Currently on basal bolus regimen with Semglee 15 units twice daily, SSI/Accu-Cheks as well as glipizide and metformin. Continue Neurontin for neuropathy Recent Labs  Lab 07/12/22 1214 07/12/22 1658 07/12/22 2107 07/13/22 2143 07/14/22 0808  GLUCAP 173* 172* 176* 164* 230*   Chronic back pain On Cymbalta 30 mg daily  Overactive bladder Vesicare  Insomnia Ambien nightly  Goals of care   Code Status: Full  Code     DVT prophylaxis:  enoxaparin (LOVENOX) injection 40 mg Start: 07/14/22 1200 SCDs Start: 07/07/22 1701 Place TED hose Start: 07/07/22 1701 SCDs Start: 07/07/22 0026   Antimicrobials: None Fluid: Not on IV fluid Consultants: Orthopedics Family Communication: Wife at bedside today.  Status: Inpatient Level of care:   Telemetry Medical   Needs to continue in-hospital care:  Pending CIR.   Patient from: Home Anticipated d/c to: Pending CIR.  Appeal in process   Diet:  Diet Order             Diet regular Room service appropriate? Yes; Fluid consistency: Thin  Diet effective now                   Scheduled Meds:  acetaminophen  1,000 mg Oral TID   amLODipine  5 mg Oral QPM   aspirin  81 mg Oral Daily   atorvastatin  20 mg Oral QPM   DULoxetine  30 mg Oral Daily   enoxaparin (LOVENOX) injection  40 mg Subcutaneous Q24H   fesoterodine  4 mg Oral Daily   gabapentin  100 mg Oral BID   And   gabapentin  200 mg Oral QHS   glipiZIDE  5 mg Oral BID AC   hydrochlorothiazide  25 mg Oral q morning   insulin aspart  0-15 Units Subcutaneous TID WC   insulin aspart  0-5 Units Subcutaneous QHS   insulin detemir  15 Units Subcutaneous BID   metFORMIN  1,000 mg Oral BID WC   metoprolol tartrate  25 mg Oral q morning   pantoprazole  40 mg Oral BID AC   ramipril  20 mg Oral Daily    PRN meds: alum & mag hydroxide-simeth, hydrALAZINE, magnesium citrate, menthol-cetylpyridinium **OR** phenol, methocarbamol **OR** methocarbamol (ROBAXIN) IV, morphine injection, ondansetron **OR** ondansetron (ZOFRAN) IV, oxyCODONE, polyethylene glycol, sorbitol, zolpidem   Infusions:   methocarbamol (ROBAXIN) IV      Antimicrobials: Anti-infectives (From admission, onward)    Start     Dose/Rate Route Frequency Ordered Stop   07/07/22 2100  ceFAZolin (ANCEF) IVPB 2g/100 mL premix        2 g 200 mL/hr over 30 Minutes Intravenous Every 6 hours 07/07/22 1701 07/08/22 0855   07/07/22 0600  ceFAZolin (ANCEF) IVPB 2g/100 mL premix        2 g 200 mL/hr over 30 Minutes Intravenous On call to O.R. 07/06/22 1925 07/07/22 1445       Nutritional status:  Body mass index is 27.37 kg/m.          Objective: Vitals:   07/14/22 0510 07/14/22 0805  BP: (!) 133/58 (!) 149/56  Pulse: 66 98  Resp: 17 17  Temp:  97.6 F (36.4 C) 98 F (36.7 C)  SpO2: 95% 98%    Intake/Output Summary (Last 24 hours) at 07/14/2022 1108 Last data filed at 07/14/2022 0528 Gross per 24 hour  Intake 480 ml  Output 1350 ml  Net -870 ml   Filed Weights   07/11/22 0720  Weight: 81.6 kg   Weight change:  Body mass index is 27.37 kg/m.   Physical Exam: General exam: Pleasant, elderly Caucasian male.  Mild distress because of diarrhea. Skin: No rashes, lesions or ulcers. HEENT: Atraumatic, normocephalic, no obvious bleeding Lungs: Clear to auscultation bilaterally CVS: Regular rate and rhythm, no murmur GI/Abd soft, epigastric tenderness improved.  Nontender, nondistended, bowel sound present CNS: Alert awake oriented x 3 Psychiatry: Mood appropriate Extremities: No pedal  edema, no calf tenderness  Data Review: I have personally reviewed the laboratory data and studies available.  F/u labs ordered Unresulted Labs (From admission, onward)     Start     Ordered   07/14/22 0500  Creatinine, serum  (enoxaparin (LOVENOX)  CrCl >/= 30 mL/min  )  Weekly,   R     Comments: while on enoxaparin therapy.    07/07/22 1701            Total time spent in review of labs and imaging, patient evaluation, formulation of plan, documentation and communication with family: 45 minutes  Signed, Lorin Glass, MD [facility] 07/14/2022

## 2022-07-14 NOTE — Progress Notes (Signed)
Inpatient Rehabilitation Admissions Coordinator   I continue to await appeals determination for possible CIR admit. I have contacted appeals department daily. I am working with Health visitor through process at this time.  Ottie Glazier, RN, MSN Rehab Admissions Coordinator [phone] 07/14/2022 10:15 AM

## 2022-07-15 DIAGNOSIS — S72142A Displaced intertrochanteric fracture of left femur, initial encounter for closed fracture: Secondary | ICD-10-CM | POA: Diagnosis not present

## 2022-07-15 LAB — GLUCOSE, CAPILLARY
Glucose-Capillary: 194 mg/dL — ABNORMAL HIGH (ref 70–99)
Glucose-Capillary: 270 mg/dL — ABNORMAL HIGH (ref 70–99)

## 2022-07-15 MED ORDER — ORAL CARE MOUTH RINSE: 15.0000 mL | OROMUCOSAL | Status: DC | PRN

## 2022-07-15 MED ORDER — LOPERAMIDE HCL 2 MG PO CAPS
2.0000 mg | ORAL_CAPSULE | ORAL | Status: DC | PRN
  Administered 2022-07-18: 2 mg via ORAL
  Filled 2022-07-15: qty 1

## 2022-07-15 NOTE — Progress Notes (Signed)
  Inpatient Rehabilitation Admissions Coordinator   I have received a denial on the appeal for CIR admit. Appeal # [id number] and CASE # [id number] from [place] with appeals. I contacted patient's wife by phone and she is aware. They would like to proceed with SNF placement. Acute team and TOC made aware. We will sign off.  Ottie Glazier, RN, MSN Rehab Admissions Coordinator [phone] 07/15/2022 12:51 PM

## 2022-07-15 NOTE — Hospital Course (Signed)
Alejandro Foster is a 89 y.o. male with PMH significant for DM2, HTN, HLD, CAD/stents, DDD, lumbar spine stenosis, radiculopathy, chronic back pain, peripheral neuropathy, nephrolithiasis who lives at home and does not use any assistive devices to ambulate. 4/18, patient was brought to the ED by EMS after a fall at home.  Patient had stumbled over a trailer hitch in the driveway causing a mechanical fall.  This resulted in left hip pain after fall. X-rays in the ED showed comminuted displaced and angulated intertrochanteric fracture of the left hip Orthopedic surgery was consulted 4/19, underwent intramedullary nailing by Dr. Roda Shutters.

## 2022-07-15 NOTE — Progress Notes (Signed)
Progress Note    CAETANO OBERHAUS   ZOX:[id number]  DOB: 08/11/1933  DOA: 07/06/2022     9 PCP: Creola Corn, MD  Initial CC: Mechanical fall  Hospital Course: Alejandro Foster is a 89 y.o. male with PMH significant for DM2, HTN, HLD, CAD/stents, DDD, lumbar spine stenosis, radiculopathy, chronic back pain, peripheral neuropathy, nephrolithiasis who lives at home and does not use any assistive devices to ambulate. 4/18, patient was brought to the ED by EMS after a fall at home.  Patient had stumbled over a trailer hitch in the driveway causing a mechanical fall.  This resulted in left hip pain after fall. X-rays in the ED showed comminuted displaced and angulated intertrochanteric fracture of the left hip Orthopedic surgery was consulted 4/19, underwent intramedullary nailing by Dr. Roda Shutters.   Interval History:  No events overnight.  Wife and family present bedside when seen this afternoon.  Sitting up in recliner comfortably in no distress.  Working well with physical therapy and awaiting insurance appeal for CIR.  Assessment and Plan:  Comminuted Displaced and Angulated Intertrochanteric Fracture of the Left Hip  - S/p intramedullary nailing 4/19 - Dr. Roda Shutters - continue pain control  - insurance appeal denied; next steps will be pursuing short term rehab   Severe esophagitis Patient complained of chronic heartburn and food stuck sensation.  He takes Pepcid PRN Mylanta at home.  His symptoms were especially worse last few days in the hospital.   GI was consulted.  Underwent EGD 4/23.  It showed severe acute and erosive esophagitis with no bleeding.  He also has erosive gastropathy with no bleeding and no stigmata of recent bleeding.  Biopsy taken.  Because of dysmotility that was noticed in esophagogram, botulinum toxin was injected. Patient to continue Protonix twice daily for 90 days.  Continue Mylanta as needed.  Avoid aspirin and NSAIDs To follow-up with GI as an outpatient.  Biopsy and H. pylori  test result.   Diarrhea - still having some diarrhea - laxatives now PRN - will add imodium PRN also    Hypokalemia - replete as needed  Essential hypertension PTA on metoprolol titrate 25 mg daily, amlodipine 5 mg daily, HCTZ 25 mg daily, ramipril 20 mg daily Currently blood pressure and heart rate remain controlled with home regimen.   CAD/HLD Recent low risk stress test with Dr. Rosemary Holms PTA on aspirin 81 mg daily, statin and fibrate   Type 2 diabetes mellitus A1c 7.2% on 07/07/2022 PTA on Humalog 75/25 twice daily-35 units AM, 22 units p.m. also on glipizide 2.5 mg twice daily and metformin 500 mg twice daily. Currently on basal bolus regimen with Semglee 15 units twice daily, SSI/Accu-Cheks as well as glipizide and metformin. Continue Neurontin for neuropathy  Chronic back pain On Cymbalta 30 mg daily   Overactive bladder Vesicare   Insomnia Ambien nightly  Old records reviewed in assessment of this patient  Antimicrobials:   DVT prophylaxis:  enoxaparin (LOVENOX) injection 40 mg Start: 07/14/22 1200 SCDs Start: 07/07/22 1701 Place TED hose Start: 07/07/22 1701 SCDs Start: 07/07/22 0026   Code Status:   Code Status: Full Code  Mobility Assessment (last 72 hours)     Mobility Assessment     Row Name 07/14/22 2000 07/14/22 1334 07/14/22 0930 07/13/22 2152 07/13/22 1318   Does patient have an order for bedrest or is patient medically unstable No - Continue assessment -- No - Continue assessment No - Continue assessment --   What is the highest  level of mobility based on the progressive mobility assessment? Level 5 (Walks with assist in room/hall) - Balance while stepping forward/back and can walk in room with assist - Complete Level 5 (Walks with assist in room/hall) - Balance while stepping forward/back and can walk in room with assist - Complete Level 5 (Walks with assist in room/hall) - Balance while stepping forward/back and can walk in room with assist -  Complete Level 5 (Walks with assist in room/hall) - Balance while stepping forward/back and can walk in room with assist - Complete Level 5 (Walks with assist in room/hall) - Balance while stepping forward/back and can walk in room with assist - Complete    Row Name 07/13/22 1309 07/13/22 1026 07/12/22 2113       Does patient have an order for bedrest or is patient medically unstable -- No - Continue assessment No - Continue assessment     What is the highest level of mobility based on the progressive mobility assessment? Level 4 (Walks with assist in room) - Balance while marching in place and cannot step forward and back - Complete Level 4 (Walks with assist in room) - Balance while marching in place and cannot step forward and back - Complete Level 4 (Walks with assist in room) - Balance while marching in place and cannot step forward and back - Complete     Is the above level different from baseline mobility prior to current illness? -- Yes - Recommend PT order --              Barriers to discharge:  Disposition Plan:  SNF Status is: Inpt  Objective: Blood pressure (!) 152/61, pulse 68, temperature 97.8 F (36.6 C), temperature source Oral, resp. rate 18, height 5\' 8"  (1.727 m), weight 81.6 kg, SpO2 99 %.  Examination:  Physical Exam Constitutional:      Appearance: Normal appearance.  HENT:     Head: Normocephalic and atraumatic.     Mouth/Throat:     Mouth: Mucous membranes are moist.  Eyes:     Extraocular Movements: Extraocular movements intact.  Cardiovascular:     Rate and Rhythm: Normal rate and regular rhythm.  Pulmonary:     Effort: Pulmonary effort is normal. No respiratory distress.     Breath sounds: Normal breath sounds. No wheezing.  Abdominal:     General: Bowel sounds are normal. There is no distension.     Palpations: Abdomen is soft.     Tenderness: There is no abdominal tenderness.  Musculoskeletal:        General: Normal range of motion.     Cervical  back: Normal range of motion and neck supple.     Comments: Bruising noted along the left lateral hip.  Compartments soft.  Appropriate tenderness to palpation  Skin:    General: Skin is warm and dry.  Neurological:     General: No focal deficit present.     Mental Status: He is alert.  Psychiatric:        Mood and Affect: Mood normal.      Consultants:  Orthopedic surgery GI  Procedures:  07/11/22: EGD 07/07/22: Open treatment of intertrochanteric fracture with intramedullary implant. CPT [phone]   Data Reviewed: Results for orders placed or performed during the hospital encounter of 07/06/22 (from the past 24 hour(s))  Glucose, capillary     Status: Abnormal   Collection Time: 07/14/22  5:40 PM  Result Value Ref Range   Glucose-Capillary 109 (H) 70 -  99 mg/dL  Glucose, capillary     Status: Abnormal   Collection Time: 07/14/22  7:37 PM  Result Value Ref Range   Glucose-Capillary 132 (H) 70 - 99 mg/dL  Glucose, capillary     Status: Abnormal   Collection Time: 07/15/22  8:19 AM  Result Value Ref Range   Glucose-Capillary 194 (H) 70 - 99 mg/dL  Glucose, capillary     Status: Abnormal   Collection Time: 07/15/22 11:34 AM  Result Value Ref Range   Glucose-Capillary 270 (H) 70 - 99 mg/dL    I have reviewed pertinent nursing notes, vitals, labs, and images as necessary. I have ordered labwork to follow up on as indicated.  I have reviewed the last notes from staff over past 24 hours. I have discussed patient's care plan and test results with nursing staff, CM/SW, and other staff as appropriate.  Time spent: Greater than 50% of the 55 minute visit was spent in counseling/coordination of care for the patient as laid out in the A&P.   LOS: 9 days   Lewie Chamber, MD [facility] 07/15/2022, 3:26 PM

## 2022-07-16 ENCOUNTER — Encounter (HOSPITAL_COMMUNITY): Payer: Self-pay | Admitting: Gastroenterology

## 2022-07-16 DIAGNOSIS — S72142A Displaced intertrochanteric fracture of left femur, initial encounter for closed fracture: Secondary | ICD-10-CM | POA: Diagnosis not present

## 2022-07-16 LAB — GLUCOSE, CAPILLARY
Glucose-Capillary: 260 mg/dL — ABNORMAL HIGH (ref 70–99)
Glucose-Capillary: 186 mg/dL — ABNORMAL HIGH (ref 70–99)
Glucose-Capillary: 127 mg/dL — ABNORMAL HIGH (ref 70–99)
Glucose-Capillary: 228 mg/dL — ABNORMAL HIGH (ref 70–99)

## 2022-07-16 NOTE — Progress Notes (Signed)
Progress Note    Alejandro Foster   QMV:[id number]  DOB: [birth year]  DOA: 07/06/2022     10 PCP: Creola Corn, MD  Initial CC: Mechanical fall  Hospital Course: Alejandro Foster is a 89 y.o. male with PMH significant for DM2, HTN, HLD, CAD/stents, DDD, lumbar spine stenosis, radiculopathy, chronic back pain, peripheral neuropathy, nephrolithiasis who lives at home and does not use any assistive devices to ambulate. 4/18, patient was brought to the ED by EMS after a fall at home.  Patient had stumbled over a trailer hitch in the driveway causing a mechanical fall.  This resulted in left hip pain after fall. X-rays in the ED showed comminuted displaced and angulated intertrochanteric fracture of the left hip Orthopedic surgery was consulted 4/19, underwent intramedullary nailing by Dr. Roda Shutters.   Interval History:  No events overnight.  Wife and family present bedside.  Diarrhea stable. Some rash on his back consistent with heat rash.  He was asking if he could use the shower stool to shower later today as well; stated this would be okay.  Assessment and Plan:  Comminuted Displaced and Angulated Intertrochanteric Fracture of the Left Hip  - S/p intramedullary nailing 4/19 - Dr. Roda Shutters - continue pain control  - insurance appeal denied; next steps will be pursuing short term rehab   Severe esophagitis Patient complained of chronic heartburn and food stuck sensation.  He takes Pepcid PRN Mylanta at home.  His symptoms were especially worse last few days in the hospital.   GI was consulted.  Underwent EGD 4/23.  It showed severe acute and erosive esophagitis with no bleeding.  He also has erosive gastropathy with no bleeding and no stigmata of recent bleeding.  Biopsy taken.  Because of dysmotility that was noticed in esophagogram, botulinum toxin was injected. Patient to continue Protonix twice daily for 90 days.  Continue Mylanta as needed.  Avoid aspirin and NSAIDs To follow-up with GI as an  outpatient.  Biopsy and H. pylori test result.   Diarrhea - still having some diarrhea - laxatives now PRN - will add imodium PRN also   Heat rash - Continue supportive care - Okay for using shower stool to shower with assistance   Hypokalemia - replete as needed  Essential hypertension PTA on metoprolol titrate 25 mg daily, amlodipine 5 mg daily, HCTZ 25 mg daily, ramipril 20 mg daily Currently blood pressure and heart rate remain controlled with home regimen.   CAD/HLD Recent low risk stress test with Dr. Rosemary Holms PTA on aspirin 81 mg daily, statin and fibrate   Type 2 diabetes mellitus A1c 7.2% on 07/07/2022 PTA on Humalog 75/25 twice daily-35 units AM, 22 units p.m. also on glipizide 2.5 mg twice daily and metformin 500 mg twice daily. Currently on basal bolus regimen with Semglee 15 units twice daily, SSI/Accu-Cheks as well as glipizide and metformin. Continue Neurontin for neuropathy  Chronic back pain On Cymbalta 30 mg daily   Overactive bladder Vesicare   Insomnia Ambien nightly  Old records reviewed in assessment of this patient  Antimicrobials:   DVT prophylaxis:  enoxaparin (LOVENOX) injection 40 mg Start: 07/14/22 1200 SCDs Start: 07/07/22 1701 Place TED hose Start: 07/07/22 1701 SCDs Start: 07/07/22 0026   Code Status:   Code Status: Full Code  Mobility Assessment (last 72 hours)     Mobility Assessment     Row Name 07/16/22 0904 07/15/22 1944 07/14/22 2000 07/14/22 1334 07/14/22 0930   Does patient have an order for  bedrest or is patient medically unstable No - Continue assessment No - Continue assessment No - Continue assessment -- No - Continue assessment   What is the highest level of mobility based on the progressive mobility assessment? Level 5 (Walks with assist in room/hall) - Balance while stepping forward/back and can walk in room with assist - Complete Level 5 (Walks with assist in room/hall) - Balance while stepping forward/back and  can walk in room with assist - Complete Level 5 (Walks with assist in room/hall) - Balance while stepping forward/back and can walk in room with assist - Complete Level 5 (Walks with assist in room/hall) - Balance while stepping forward/back and can walk in room with assist - Complete Level 5 (Walks with assist in room/hall) - Balance while stepping forward/back and can walk in room with assist - Complete   Is the above level different from baseline mobility prior to current illness? Yes - Recommend PT order -- -- -- --    Row Name 07/13/22 2152           Does patient have an order for bedrest or is patient medically unstable No - Continue assessment       What is the highest level of mobility based on the progressive mobility assessment? Level 5 (Walks with assist in room/hall) - Balance while stepping forward/back and can walk in room with assist - Complete                Barriers to discharge:  Disposition Plan:  SNF Status is: Inpt  Objective: Blood pressure (!) 125/51, pulse 81, temperature 97.8 F (36.6 C), resp. rate 17, height 5\' 8"  (1.727 m), weight 81.6 kg, SpO2 96 %.  Examination:  Physical Exam Constitutional:      Appearance: Normal appearance.  HENT:     Head: Normocephalic and atraumatic.     Mouth/Throat:     Mouth: Mucous membranes are moist.  Eyes:     Extraocular Movements: Extraocular movements intact.  Cardiovascular:     Rate and Rhythm: Normal rate and regular rhythm.  Pulmonary:     Effort: Pulmonary effort is normal. No respiratory distress.     Breath sounds: Normal breath sounds. No wheezing.  Abdominal:     General: Bowel sounds are normal. There is no distension.     Palpations: Abdomen is soft.     Tenderness: There is no abdominal tenderness.  Musculoskeletal:        General: Normal range of motion.     Cervical back: Normal range of motion and neck supple.     Comments: Bruising noted along the left lateral hip.  Compartments soft.   Appropriate tenderness to palpation  Skin:    General: Skin is warm and dry.  Neurological:     General: No focal deficit present.     Mental Status: He is alert.  Psychiatric:        Mood and Affect: Mood normal.      Consultants:  Orthopedic surgery GI  Procedures:  07/11/22: EGD 07/07/22: Open treatment of intertrochanteric fracture with intramedullary implant. CPT [phone]   Data Reviewed: Results for orders placed or performed during the hospital encounter of 07/06/22 (from the past 24 hour(s))  Glucose, capillary     Status: Abnormal   Collection Time: 07/16/22  7:46 AM  Result Value Ref Range   Glucose-Capillary 260 (H) 70 - 99 mg/dL  Glucose, capillary     Status: Abnormal   Collection Time: 07/16/22 12:07  PM  Result Value Ref Range   Glucose-Capillary 186 (H) 70 - 99 mg/dL    I have reviewed pertinent nursing notes, vitals, labs, and images as necessary. I have ordered labwork to follow up on as indicated.  I have reviewed the last notes from staff over past 24 hours. I have discussed patient's care plan and test results with nursing staff, CM/SW, and other staff as appropriate.    LOS: 10 days   Lewie Chamber, MD [facility] 07/16/2022, 2:45 PM

## 2022-07-16 NOTE — Plan of Care (Signed)
Problem: Education: Goal: Ability to describe self-care measures that may prevent or decrease complications (Diabetes Survival Skills Education) will improve Outcome: Progressing Goal: Individualized Educational Video(s) Outcome: Progressing   Problem: Coping: Goal: Ability to adjust to condition or change in health will improve Outcome: Progressing   Problem: Fluid Volume: Goal: Ability to maintain a balanced intake and output will improve Outcome: Progressing   Problem: Health Behavior/Discharge Planning: Goal: Ability to identify and utilize available resources and services will improve Outcome: Progressing Goal: Ability to manage health-related needs will improve Outcome: Progressing   Problem: Metabolic: Goal: Ability to maintain appropriate glucose levels will improve Outcome: Progressing   Problem: Nutritional: Goal: Maintenance of adequate nutrition will improve Outcome: Progressing Goal: Progress toward achieving an optimal weight will improve Outcome: Progressing   Problem: Skin Integrity: Goal: Risk for impaired skin integrity will decrease Outcome: Progressing   Problem: Tissue Perfusion: Goal: Adequacy of tissue perfusion will improve Outcome: Progressing   Problem: Education: Goal: Knowledge of General Education information will improve Description: Including pain rating scale, medication(s)/side effects and non-pharmacologic comfort measures Outcome: Progressing   Problem: Health Behavior/Discharge Planning: Goal: Ability to manage health-related needs will improve Outcome: Progressing   Problem: Clinical Measurements: Goal: Ability to maintain clinical measurements within normal limits will improve Outcome: Progressing Goal: Will remain free from infection Outcome: Progressing Goal: Diagnostic test results will improve Outcome: Progressing Goal: Respiratory complications will improve Outcome: Progressing Goal: Cardiovascular complication will  be avoided Outcome: Progressing   Problem: Activity: Goal: Risk for activity intolerance will decrease Outcome: Progressing   Problem: Nutrition: Goal: Adequate nutrition will be maintained Outcome: Progressing   Problem: Coping: Goal: Level of anxiety will decrease Outcome: Progressing   Problem: Elimination: Goal: Will not experience complications related to bowel motility Outcome: Progressing Goal: Will not experience complications related to urinary retention Outcome: Progressing   Problem: Pain Managment: Goal: General experience of comfort will improve Outcome: Progressing   Problem: Safety: Goal: Ability to remain free from injury will improve Outcome: Progressing   Problem: Skin Integrity: Goal: Risk for impaired skin integrity will decrease Outcome: Progressing   Problem: Education: Goal: Knowledge of General Education information will improve Description: Including pain rating scale, medication(s)/side effects and non-pharmacologic comfort measures Outcome: Progressing   Problem: Health Behavior/Discharge Planning: Goal: Ability to manage health-related needs will improve Outcome: Progressing   Problem: Clinical Measurements: Goal: Ability to maintain clinical measurements within normal limits will improve Outcome: Progressing Goal: Will remain free from infection Outcome: Progressing Goal: Diagnostic test results will improve Outcome: Progressing Goal: Respiratory complications will improve Outcome: Progressing Goal: Cardiovascular complication will be avoided Outcome: Progressing   Problem: Activity: Goal: Risk for activity intolerance will decrease Outcome: Progressing   Problem: Nutrition: Goal: Adequate nutrition will be maintained Outcome: Progressing   Problem: Coping: Goal: Level of anxiety will decrease Outcome: Progressing   Problem: Elimination: Goal: Will not experience complications related to bowel motility Outcome:  Progressing Goal: Will not experience complications related to urinary retention Outcome: Progressing   Problem: Pain Managment: Goal: General experience of comfort will improve Outcome: Progressing   Problem: Safety: Goal: Ability to remain free from injury will improve Outcome: Progressing   Problem: Skin Integrity: Goal: Risk for impaired skin integrity will decrease Outcome: Progressing   Problem: Education: Goal: Verbalization of understanding the information provided (i.e., activity precautions, restrictions, etc) will improve Outcome: Progressing Goal: Individualized Educational Video(s) Outcome: Progressing   Problem: Activity: Goal: Ability to ambulate and perform ADLs will improve Outcome: Progressing     Problem: Clinical Measurements: Goal: Postoperative complications will be avoided or minimized Outcome: Progressing   

## 2022-07-17 DIAGNOSIS — S72142A Displaced intertrochanteric fracture of left femur, initial encounter for closed fracture: Secondary | ICD-10-CM | POA: Diagnosis not present

## 2022-07-17 LAB — BASIC METABOLIC PANEL
Anion gap: 9 (ref 5–15)
BUN: 5 mg/dL — ABNORMAL LOW (ref 8–23)
CO2: 26 mmol/L (ref 22–32)
Calcium: 8.6 mg/dL — ABNORMAL LOW (ref 8.9–10.3)
Chloride: 99 mmol/L (ref 98–111)
Creatinine, Ser: 0.99 mg/dL (ref 0.61–1.24)
GFR, Estimated: >60 mL/min (ref >60)
Glucose, Bld: 133 mg/dL — ABNORMAL HIGH (ref 70–99)
Potassium: 2.7 mmol/L — CL (ref 3.5–5.1)
Sodium: 134 mmol/L — ABNORMAL LOW (ref 135–145)

## 2022-07-17 LAB — CBC WITH DIFFERENTIAL/PLATELET
Abs Immature Granulocytes: 0.06 10*3/uL (ref 0.00–0.07)
Basophils Absolute: 0.1 10*3/uL (ref 0.0–0.1)
Basophils Relative: 1 %
Eosinophils Absolute: 0.3 10*3/uL (ref 0.0–0.5)
Eosinophils Relative: 3 %
HCT: 31.8 % — ABNORMAL LOW (ref 39.0–52.0)
Hemoglobin: 10.6 g/dL — ABNORMAL LOW (ref 13.0–17.0)
Immature Granulocytes: 1 %
Lymphocytes Relative: 28 %
Lymphs Abs: 3.4 10*3/uL (ref 0.7–4.0)
MCH: 28.5 pg (ref 26.0–34.0)
MCHC: 33.3 g/dL (ref 30.0–36.0)
MCV: 85.5 fL (ref 80.0–100.0)
Monocytes Absolute: 1.5 10*3/uL — ABNORMAL HIGH (ref 0.1–1.0)
Monocytes Relative: 12 %
Neutro Abs: 6.7 10*3/uL (ref 1.7–7.7)
Neutrophils Relative %: 55 %
Platelets: 440 10*3/uL — ABNORMAL HIGH (ref 150–400)
RBC: 3.72 MIL/uL — ABNORMAL LOW (ref 4.22–5.81)
RDW: 14.1 % (ref 11.5–15.5)
WBC: 12 10*3/uL — ABNORMAL HIGH (ref 4.0–10.5)
nRBC: 0 % (ref 0.0–0.2)

## 2022-07-17 LAB — GLUCOSE, CAPILLARY
Glucose-Capillary: 199 mg/dL — ABNORMAL HIGH (ref 70–99)
Glucose-Capillary: 248 mg/dL — ABNORMAL HIGH (ref 70–99)
Glucose-Capillary: 232 mg/dL — ABNORMAL HIGH (ref 70–99)
Glucose-Capillary: 82 mg/dL (ref 70–99)
Glucose-Capillary: 233 mg/dL — ABNORMAL HIGH (ref 70–99)
Glucose-Capillary: 172 mg/dL — ABNORMAL HIGH (ref 70–99)
Glucose-Capillary: 201 mg/dL — ABNORMAL HIGH (ref 70–99)
Glucose-Capillary: 163 mg/dL — ABNORMAL HIGH (ref 70–99)
Glucose-Capillary: 187 mg/dL — ABNORMAL HIGH (ref 70–99)

## 2022-07-17 LAB — MAGNESIUM: Magnesium: 1.4 mg/dL — ABNORMAL LOW (ref 1.7–2.4)

## 2022-07-17 MED ORDER — POTASSIUM CHLORIDE CRYS ER 20 MEQ PO TBCR
40.0000 meq | EXTENDED_RELEASE_TABLET | Freq: Once | ORAL | Status: AC
  Administered 2022-07-17: 40 meq via ORAL
  Filled 2022-07-17: qty 2

## 2022-07-17 MED ORDER — MAGNESIUM SULFATE 2 GM/50ML IV SOLN
2.0000 g | Freq: Once | INTRAVENOUS | Status: AC
  Administered 2022-07-17: 2 g via INTRAVENOUS
  Filled 2022-07-17: qty 50

## 2022-07-17 MED ORDER — POTASSIUM CHLORIDE 20 MEQ PO PACK
60.0000 meq | PACK | Freq: Once | ORAL | Status: AC
  Administered 2022-07-17: 60 meq via ORAL
  Filled 2022-07-17: qty 3

## 2022-07-17 NOTE — Progress Notes (Signed)
Potassium came back 2.7, on-call notified, new order given. We continue to monitor.

## 2022-07-17 NOTE — Progress Notes (Signed)
Progress Note    Alejandro Foster   WJX:[id number]  DOB: [birth year]  DOA: 07/06/2022     11 PCP: Creola Corn, MD  Initial CC: Mechanical fall  Hospital Course: Alejandro Foster is a 89 y.o. male with PMH significant for DM2, HTN, HLD, CAD/stents, DDD, lumbar spine stenosis, radiculopathy, chronic back pain, peripheral neuropathy, nephrolithiasis who lives at home and does not use any assistive devices to ambulate. 4/18, patient was brought to the ED by EMS after a fall at home.  Patient had stumbled over a trailer hitch in the driveway causing a mechanical fall.  This resulted in left hip pain after fall. X-rays in the ED showed comminuted displaced and angulated intertrochanteric fracture of the left hip Orthopedic surgery was consulted 4/19, underwent intramedullary nailing by Dr. Roda Shutters.   Interval History:  No events overnight.  Feeling better daily. BM improved also and more formed.   Assessment and Plan:  Comminuted Displaced and Angulated Intertrochanteric Fracture of the Left Hip  - S/p intramedullary nailing 4/19 - Dr. Roda Shutters - continue pain control  - insurance appeal denied; patient accepted [facility]; okay for discharge once insurance auth received  -Continue working with PT until discharge   Severe esophagitis Patient complained of chronic heartburn and food stuck sensation.  He takes Pepcid PRN Mylanta at home.  His symptoms were especially worse last few days in the hospital.   GI was consulted.  Underwent EGD 4/23.  It showed severe acute and erosive esophagitis with no bleeding.  He also has erosive gastropathy with no bleeding and no stigmata of recent bleeding.  Biopsy taken.  Because of dysmotility that was noticed in esophagogram, botulinum toxin was injected. Patient to continue Protonix twice daily for 90 days.  Continue Mylanta as needed.  Avoid aspirin and NSAIDs To follow-up with GI as an outpatient.  Biopsy and H. pylori test result.   Diarrhea -resolved - laxatives  now PRN - will add imodium PRN also   Heat rash -improved - Continue supportive care - Okay for using shower stool to shower with assistance   Hypokalemia - replete as needed  Essential hypertension PTA on metoprolol titrate 25 mg daily, amlodipine 5 mg daily, HCTZ 25 mg daily, ramipril 20 mg daily Currently blood pressure and heart rate remain controlled with home regimen.   CAD/HLD Recent low risk stress test with Dr. Rosemary Holms PTA on aspirin 81 mg daily, statin and fibrate   Type 2 diabetes mellitus A1c 7.2% on 07/07/2022 PTA on Humalog 75/25 twice daily-35 units AM, 22 units p.m. also on glipizide 2.5 mg twice daily and metformin 500 mg twice daily. Currently on basal bolus regimen with Semglee 15 units twice daily, SSI/Accu-Cheks as well as glipizide and metformin. Continue Neurontin for neuropathy  Chronic back pain On Cymbalta 30 mg daily   Overactive bladder Vesicare   Insomnia Ambien nightly  Old records reviewed in assessment of this patient  Antimicrobials:   DVT prophylaxis:  enoxaparin (LOVENOX) injection 40 mg Start: 07/14/22 1200 SCDs Start: 07/07/22 1701 Place TED hose Start: 07/07/22 1701 SCDs Start: 07/07/22 0026   Code Status:   Code Status: Full Code  Mobility Assessment (last 72 hours)     Mobility Assessment     Row Name 07/17/22 1339 07/17/22 0938 07/17/22 0933 07/16/22 1945 07/16/22 0904   Does patient have an order for bedrest or is patient medically unstable -- -- No - Continue assessment No - Continue assessment No - Continue assessment  What is the highest level of mobility based on the progressive mobility assessment? Level 5 (Walks with assist in room/hall) - Balance while stepping forward/back and can walk in room with assist - Complete Level 5 (Walks with assist in room/hall) - Balance while stepping forward/back and can walk in room with assist - Complete Level 5 (Walks with assist in room/hall) - Balance while stepping  forward/back and can walk in room with assist - Complete Level 5 (Walks with assist in room/hall) - Balance while stepping forward/back and can walk in room with assist - Complete Level 5 (Walks with assist in room/hall) - Balance while stepping forward/back and can walk in room with assist - Complete   Is the above level different from baseline mobility prior to current illness? -- -- Yes - Recommend PT order Yes - Recommend PT order Yes - Recommend PT order    Row Name 07/15/22 1944 07/14/22 2000         Does patient have an order for bedrest or is patient medically unstable No - Continue assessment No - Continue assessment      What is the highest level of mobility based on the progressive mobility assessment? Level 5 (Walks with assist in room/hall) - Balance while stepping forward/back and can walk in room with assist - Complete Level 5 (Walks with assist in room/hall) - Balance while stepping forward/back and can walk in room with assist - Complete               Barriers to discharge:  Disposition Plan:  SNF Status is: Inpt  Objective: Blood pressure 121/60, pulse 62, temperature 98.1 F (36.7 C), temperature source Oral, resp. rate 18, height 5\' 8"  (1.727 m), weight 81.6 kg, SpO2 94 %.  Examination:  Physical Exam Constitutional:      Appearance: Normal appearance.  HENT:     Head: Normocephalic and atraumatic.     Mouth/Throat:     Mouth: Mucous membranes are moist.  Eyes:     Extraocular Movements: Extraocular movements intact.  Cardiovascular:     Rate and Rhythm: Normal rate and regular rhythm.  Pulmonary:     Effort: Pulmonary effort is normal. No respiratory distress.     Breath sounds: Normal breath sounds. No wheezing.  Abdominal:     General: Bowel sounds are normal. There is no distension.     Palpations: Abdomen is soft.     Tenderness: There is no abdominal tenderness.  Musculoskeletal:        General: Normal range of motion.     Cervical back: Normal  range of motion and neck supple.     Comments: Bruising noted along the left lateral hip.  Compartments soft.  Appropriate tenderness to palpation  Skin:    General: Skin is warm and dry.  Neurological:     General: No focal deficit present.     Mental Status: He is alert.  Psychiatric:        Mood and Affect: Mood normal.      Consultants:  Orthopedic surgery GI  Procedures:  07/11/22: EGD 07/07/22: Open treatment of intertrochanteric fracture with intramedullary implant. CPT [phone]   Data Reviewed: Results for orders placed or performed during the hospital encounter of 07/06/22 (from the past 24 hour(s))  Glucose, capillary     Status: Abnormal   Collection Time: 07/16/22  3:50 PM  Result Value Ref Range   Glucose-Capillary 127 (H) 70 - 99 mg/dL  Glucose, capillary  Status: Abnormal   Collection Time: 07/16/22  9:02 PM  Result Value Ref Range   Glucose-Capillary 228 (H) 70 - 99 mg/dL  Basic metabolic panel     Status: Abnormal   Collection Time: 07/17/22 12:39 AM  Result Value Ref Range   Sodium 134 (L) 135 - 145 mmol/L   Potassium 2.7 (LL) 3.5 - 5.1 mmol/L   Chloride 99 98 - 111 mmol/L   CO2 26 22 - 32 mmol/L   Glucose, Bld 133 (H) 70 - 99 mg/dL   BUN 5 (L) 8 - 23 mg/dL   Creatinine, Ser 1.61 0.61 - 1.24 mg/dL   Calcium 8.6 (L) 8.9 - 10.3 mg/dL   GFR, Estimated >09 >60 mL/min   Anion gap 9 5 - 15  CBC with Differential/Platelet     Status: Abnormal   Collection Time: 07/17/22 12:39 AM  Result Value Ref Range   WBC 12.0 (H) 4.0 - 10.5 K/uL   RBC 3.72 (L) 4.22 - 5.81 MIL/uL   Hemoglobin 10.6 (L) 13.0 - 17.0 g/dL   HCT 45.4 (L) 09.8 - 11.9 %   MCV 85.5 80.0 - 100.0 fL   MCH 28.5 26.0 - 34.0 pg   MCHC 33.3 30.0 - 36.0 g/dL   RDW 14.7 82.9 - 56.2 %   Platelets 440 (H) 150 - 400 K/uL   nRBC 0.0 0.0 - 0.2 %   Neutrophils Relative % 55 %   Neutro Abs 6.7 1.7 - 7.7 K/uL   Lymphocytes Relative 28 %   Lymphs Abs 3.4 0.7 - 4.0 K/uL   Monocytes Relative 12 %    Monocytes Absolute 1.5 (H) 0.1 - 1.0 K/uL   Eosinophils Relative 3 %   Eosinophils Absolute 0.3 0.0 - 0.5 K/uL   Basophils Relative 1 %   Basophils Absolute 0.1 0.0 - 0.1 K/uL   Immature Granulocytes 1 %   Abs Immature Granulocytes 0.06 0.00 - 0.07 K/uL  Magnesium     Status: Abnormal   Collection Time: 07/17/22 12:39 AM  Result Value Ref Range   Magnesium 1.4 (L) 1.7 - 2.4 mg/dL  Glucose, capillary     Status: Abnormal   Collection Time: 07/17/22  7:25 AM  Result Value Ref Range   Glucose-Capillary 172 (H) 70 - 99 mg/dL  Glucose, capillary     Status: Abnormal   Collection Time: 07/17/22 12:09 PM  Result Value Ref Range   Glucose-Capillary 201 (H) 70 - 99 mg/dL    I have reviewed pertinent nursing notes, vitals, labs, and images as necessary. I have ordered labwork to follow up on as indicated.  I have reviewed the last notes from staff over past 24 hours. I have discussed patient's care plan and test results with nursing staff, CM/SW, and other staff as appropriate.    LOS: 11 days   Lewie Chamber, MD [facility] 07/17/2022, 2:54 PM

## 2022-07-17 NOTE — Plan of Care (Signed)
Problem: Education: Goal: Ability to describe self-care measures that may prevent or decrease complications (Diabetes Survival Skills Education) will improve Outcome: Progressing Goal: Individualized Educational Video(s) Outcome: Progressing   Problem: Coping: Goal: Ability to adjust to condition or change in health will improve Outcome: Progressing   Problem: Fluid Volume: Goal: Ability to maintain a balanced intake and output will improve Outcome: Progressing   Problem: Health Behavior/Discharge Planning: Goal: Ability to identify and utilize available resources and services will improve Outcome: Progressing Goal: Ability to manage health-related needs will improve Outcome: Progressing   Problem: Metabolic: Goal: Ability to maintain appropriate glucose levels will improve Outcome: Progressing   Problem: Nutritional: Goal: Maintenance of adequate nutrition will improve Outcome: Progressing Goal: Progress toward achieving an optimal weight will improve Outcome: Progressing   Problem: Skin Integrity: Goal: Risk for impaired skin integrity will decrease Outcome: Progressing   Problem: Tissue Perfusion: Goal: Adequacy of tissue perfusion will improve Outcome: Progressing   Problem: Education: Goal: Knowledge of General Education information will improve Description: Including pain rating scale, medication(s)/side effects and non-pharmacologic comfort measures Outcome: Progressing   Problem: Health Behavior/Discharge Planning: Goal: Ability to manage health-related needs will improve Outcome: Progressing   Problem: Clinical Measurements: Goal: Ability to maintain clinical measurements within normal limits will improve Outcome: Progressing Goal: Will remain free from infection Outcome: Progressing Goal: Diagnostic test results will improve Outcome: Progressing Goal: Respiratory complications will improve Outcome: Progressing Goal: Cardiovascular complication will  be avoided Outcome: Progressing   Problem: Activity: Goal: Risk for activity intolerance will decrease Outcome: Progressing   Problem: Nutrition: Goal: Adequate nutrition will be maintained Outcome: Progressing   Problem: Coping: Goal: Level of anxiety will decrease Outcome: Progressing   Problem: Elimination: Goal: Will not experience complications related to bowel motility Outcome: Progressing Goal: Will not experience complications related to urinary retention Outcome: Progressing   Problem: Pain Managment: Goal: General experience of comfort will improve Outcome: Progressing   Problem: Safety: Goal: Ability to remain free from injury will improve Outcome: Progressing   Problem: Skin Integrity: Goal: Risk for impaired skin integrity will decrease Outcome: Progressing   Problem: Education: Goal: Knowledge of General Education information will improve Description: Including pain rating scale, medication(s)/side effects and non-pharmacologic comfort measures Outcome: Progressing   Problem: Health Behavior/Discharge Planning: Goal: Ability to manage health-related needs will improve Outcome: Progressing   Problem: Clinical Measurements: Goal: Ability to maintain clinical measurements within normal limits will improve Outcome: Progressing Goal: Will remain free from infection Outcome: Progressing Goal: Diagnostic test results will improve Outcome: Progressing Goal: Respiratory complications will improve Outcome: Progressing Goal: Cardiovascular complication will be avoided Outcome: Progressing   Problem: Activity: Goal: Risk for activity intolerance will decrease Outcome: Progressing   Problem: Nutrition: Goal: Adequate nutrition will be maintained Outcome: Progressing   Problem: Coping: Goal: Level of anxiety will decrease Outcome: Progressing   Problem: Elimination: Goal: Will not experience complications related to bowel motility Outcome:  Progressing Goal: Will not experience complications related to urinary retention Outcome: Progressing   Problem: Pain Managment: Goal: General experience of comfort will improve Outcome: Progressing   Problem: Safety: Goal: Ability to remain free from injury will improve Outcome: Progressing   Problem: Skin Integrity: Goal: Risk for impaired skin integrity will decrease Outcome: Progressing   Problem: Education: Goal: Verbalization of understanding the information provided (i.e., activity precautions, restrictions, etc) will improve Outcome: Progressing Goal: Individualized Educational Video(s) Outcome: Progressing   Problem: Activity: Goal: Ability to ambulate and perform ADLs will improve Outcome: Progressing  Problem: Clinical Measurements: Goal: Postoperative complications will be avoided or minimized Outcome: Progressing   

## 2022-07-17 NOTE — TOC Initial Note (Addendum)
Transition of Care [facility]) - Initial/Assessment Note    Patient Details  Name: Alejandro Foster MRN: [id number] Date of Birth: November 07, 1933  Transition of Care [facility]) CM/SW Contact:    Carley Hammed, LCSW Phone Number: 07/17/2022, 2:39 PM  Clinical Narrative:                 CSW notified that insurance has denied CIR appeal and family is agreeable to pursuing SNF. CSW spoke with pt and family at bedside and provided bed offers. Pt advised that [facility] is unable to extend a bed at this time. Pt and family agreeable to [facility] and [facility] transport at DC. CSW notified facility. DC date pending labs and continued medical workup. Auth will need to be started closer to DC date. TOC will continue to follow for DC needs.   3:30 MD notified CSW that [facility] can be started as pt is medically stable at this time. Auth pending, TOC will continue to follow for DC needs.  Expected Discharge Plan: Skilled Nursing Facility Barriers to Discharge: Continued Medical Work up, English as a second language teacher   Patient Goals and CMS Choice Patient states their goals for this hospitalization and ongoing recovery are:: Pt wants to be able to return home more independently. CMS Medicare.gov Compare Post Acute Care list provided to:: Patient Choice offered to / list presented to : Patient      Expected Discharge Plan and Services     Post Acute Care Choice: Skilled Nursing Facility Living arrangements for the past 2 months: Single Family Home                                      Prior Living Arrangements/Services Living arrangements for the past 2 months: Single Family Home Lives with:: Spouse Patient language and need for interpreter reviewed:: Yes Do you feel safe going back to the place where you live?: Yes      Need for Family Participation in Patient Care: Yes (Comment) Care giver support system in place?: Yes (comment)   Criminal Activity/Legal Involvement Pertinent to Current  Situation/Hospitalization: No - Comment as needed  Activities of Daily Living      Permission Sought/Granted Permission sought to share information with : Family Supports Permission granted to share information with : Yes, Verbal Permission Granted  Share Information with NAME: Eber Jones     Permission granted to share info w Relationship: Spouse     Emotional Assessment Appearance:: Appears stated age Attitude/Demeanor/Rapport: Engaged Affect (typically observed): Appropriate Orientation: : Oriented to Self, Oriented to Place, Oriented to  Time, Oriented to Situation Alcohol / Substance Use: Not Applicable Psych Involvement: No (comment)  Admission diagnosis:  Hip fracture (HCC) [S72.009A] Fall [W19.XXXA] Closed displaced intertrochanteric fracture of left femur, initial encounter [facility]) [S72.142A] Patient Active Problem List   Diagnosis Date Noted   Fall 07/06/2022   Displaced intertrochanteric fracture of left femur, initial encounter for closed fracture (HCC) 07/06/2022   CAD (coronary artery disease) 07/06/2022   Coronary artery disease 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) 05/12/2022   Paving stone retinal degeneration, bilateral 11/03/2021   Senile purpura (HCC) 09/07/2020   Claudication (HCC) 03/17/2020   Wears hearing aid    Type 2 diabetes mellitus (HCC)    S/P coronary artery stent placement    Right ureteral stone    Right leg weakness    Lumbar radiculopathy, right  History of kidney stones    DDD (degenerative disc disease), lumbar    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) 11/10/2019   Pseudophakia of both eyes 11/10/2019   Posterior vitreous detachment of both eyes 11/10/2019   Amnesia 09/19/2019   Dyspnea on exertion 08/22/2019   Atypical chest pain 08/22/2019   Diabetic retinopathy associated with type 2 diabetes mellitus (HCC) 03/27/2019   Trigger finger  05/28/2018   Dyslipidemia 01/25/2018   Diabetic peripheral neuropathy associated with type 2 diabetes mellitus (HCC) 12/12/2017   Chronic bilateral thoracic back pain 12/12/2017   Myofascial pain syndrome 12/12/2017   Foraminal stenosis of lumbar region 12/12/2017   Spondylosis without myelopathy or radiculopathy, lumbar region 12/12/2017   Cough 09/17/2017   Irritability and anger 09/17/2017   Localized edema 08/01/2017   Abnormal gait 01/09/2017   Fatigue 09/12/2016   Hypertensive heart disease without congestive heart failure 09/03/2014   Long term (current) use of insulin (HCC) 09/03/2014   Ventricular premature depolarization 09/03/2014   Encounter for general adult medical examination without abnormal findings 08/26/2014   Gout 08/28/2013   Impacted cerumen 08/28/2013   Hypo-osmolality and hyponatremia 08/28/2013   Urge incontinence of urine 08/26/2012   Age-related osteoporosis without current pathological fracture 02/19/2012   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   Vitamin D deficiency 02/19/2012   Underimmunization status 12/26/2011   Backache 08/21/2011   Hearing loss 05/23/2011   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) 03/22/2010   Kidney stone 05/25/2009   ED (erectile dysfunction) of organic origin 05/25/2009   Obesity 05/25/2009   Metabolic syndrome 05/25/2009   Polyneuropathy 12/25/2008   Hyperglycemia due to type 2 diabetes mellitus (HCC) 12/25/2008   Gastro-esophageal reflux disease without esophagitis 12/25/2008   Insomnia 12/25/2008   Dilatation of aorta (HCC) 10/02/2008   Essential hypertension 10/02/2008   Hyperlipidemia 10/02/2008   PCP:  Creola Corn, MD Pharmacy:   [facility] [phone] - [place], [place] - [address] [place] [address] Phone: [phone] Fax: [phone]  [facility] [address] [place] [address] Phone: [phone] Fax: [phone]     Social Determinants of Health (SDOH) Social History: SDOH Screenings   Tobacco Use: Medium Risk (07/16/2022)   SDOH Interventions:     Readmission Risk Interventions     No data to display

## 2022-07-17 NOTE — Progress Notes (Signed)
Physical Therapy Treatment Patient Details Name: Alejandro Foster MRN: [id number] DOB: [birth year] Today's Date: 07/17/2022   History of Present Illness 89 y.o. male admitted 4/18 s/p Lt hip fx and had INTRAMEDULLARY (IM) NAILING OF LEFT FEMUR with PMH significant for DM2, HTN, HLD, CAD/stents, DDD, lumbar spine stenosis, radiculopathy, chronic back pain, peripheral neuropathy, nephrolithiasis. Pt fell while loading his trailer.    PT Comments    Pt was received in supine and agreeable to session with family present. Pt is making good progress towards functional mobility goals. Pt able to tolerate increased gait distance with no seated rest breaks this session. Focus on increased LLE WB and RLE step length during gait trial. Pt able to stand at EOB to perform marching, however demonstrating decreased active LLE hip flexion. Pt reporting tightness in posterior LLE, so therapist performed passive LLE hamstring stretch. Pt states that he has been performing exercises independently and demonstrates improved L hip ROM this session. Pt continues to benefit from PT services to progress toward functional mobility goals.     Recommendations for follow up therapy are one component of a multi-disciplinary discharge planning process, led by the attending physician.  Recommendations may be updated based on patient status, additional functional criteria and insurance authorization.     Assistance Recommended at Discharge Frequent or constant Supervision/Assistance  Patient can return home with the following A lot of help with walking and/or transfers;A little help with bathing/dressing/bathroom;Assistance with cooking/housework;Assist for transportation;Help with stairs or ramp for entrance   Equipment Recommendations  None recommended by PT    Recommendations for Other Services       Precautions / Restrictions Precautions Precautions: Fall Restrictions Weight Bearing Restrictions: Yes LLE Weight Bearing:  Weight bearing as tolerated     Mobility  Bed Mobility Overal bed mobility: Needs Assistance Bed Mobility: Supine to Sit, Sit to Supine     Supine to sit: Supervision Sit to supine: Supervision   General bed mobility comments: Cues for technique and use of gait belt for LLE management    Transfers Overall transfer level: Needs assistance Equipment used: Rolling walker (2 wheels) Transfers: Sit to/from Stand Sit to Stand: Min guard           General transfer comment: From EOB x2 with min guard for safety. Cues for hand placement    Ambulation/Gait Ambulation/Gait assistance: Min guard Gait Distance (Feet): 70 Feet Assistive device: Rolling walker (2 wheels) Gait Pattern/deviations: Step-to pattern, Decreased stride length, Decreased stance time - left, Decreased weight shift to left, Antalgic, Trunk flexed, Step-through pattern Gait velocity: decr     General Gait Details: Step-through pattern progressing to step-through with cues. Pt demonstrating progressively more steady gait with progressed distance. Decreased L knee flexion and heel strike.       Balance Overall balance assessment: Needs assistance Sitting-balance support: No upper extremity supported, Feet supported Sitting balance-Leahy Scale: Good Sitting balance - Comments: sitting EOB   Standing balance support: Reliant on assistive device for balance, During functional activity, Bilateral upper extremity supported Standing balance-Leahy Scale: Poor Standing balance comment: With RW support                            Cognition Arousal/Alertness: Awake/alert Behavior During Therapy: WFL for tasks assessed/performed Overall Cognitive Status: Within Functional Limits for tasks assessed  Exercises General Exercises - Lower Extremity Hip Flexion/Marching: AROM, AAROM, Standing, Both, 10 reps Other Exercises Other Exercises: LLE  hamstring stretch 20 secs x2    General Comments General comments (skin integrity, edema, etc.): Wife and daughter present and supportive throughout session      Pertinent Vitals/Pain Pain Assessment Pain Assessment: 0-10 Pain Score: 2  Pain Location: L hip Pain Descriptors / Indicators: Tender, Aching, Guarding Pain Intervention(s): Monitored during session     PT Goals (current goals can now be found in the care plan section) Acute Rehab PT Goals Patient Stated Goal: Get stronger at rehab, then go home PT Goal Formulation: With patient Time For Goal Achievement: 07/21/22 Potential to Achieve Goals: Good Progress towards PT goals: Progressing toward goals    Frequency    Min 5X/week      PT Plan Current plan remains appropriate       AM-PAC PT "6 Clicks" Mobility   Outcome Measure  Help needed turning from your back to your side while in a flat bed without using bedrails?: None Help needed moving from lying on your back to sitting on the side of a flat bed without using bedrails?: A Little Help needed moving to and from a bed to a chair (including a wheelchair)?: A Little Help needed standing up from a chair using your arms (e.g., wheelchair or bedside chair)?: A Little Help needed to walk in hospital room?: A Little Help needed climbing 3-5 steps with a railing? : A Lot 6 Click Score: 18    End of Session Equipment Utilized During Treatment: Gait belt Activity Tolerance: Patient tolerated treatment well Patient left: in bed;with bed alarm set;with family/visitor present;with call bell/phone within reach Nurse Communication: Mobility status PT Visit Diagnosis: Other abnormalities of gait and mobility (R26.89);Unsteadiness on feet (R26.81);Muscle weakness (generalized) (M62.81);History of falling (Z91.81);Difficulty in walking, not elsewhere classified (R26.2);Pain     Time: 1310-1331 PT Time Calculation (min) (ACUTE ONLY): 21 min  Charges:  $Gait Training:  8-22 mins                     Johny Shock, PTA Acute Rehabilitation Services Secure Chat Preferred  [phone]    Johny Shock 07/17/2022, 1:41 PM

## 2022-07-17 NOTE — Progress Notes (Signed)
Occupational Therapy Treatment Patient Details Name: Alejandro Foster MRN: [id number] DOB: 1933-10-09 Today's Date: 07/17/2022   History of present illness 89 y.o. male admitted 4/18 s/p Lt hip fx and had INTRAMEDULLARY (IM) NAILING OF LEFT FEMUR with PMH significant for DM2, HTN, HLD, CAD/stents, DDD, lumbar spine stenosis, radiculopathy, chronic back pain, peripheral neuropathy, nephrolithiasis. Pt fell while loading his trailer.   OT comments  Patient with good progress toward patient focused goals.  Patient remains sore to L leg, but is able to move with Min A for bed mobility, and Min Guard for in room mobility.  Lower body ADL is closer to Mod A from a sit to stand level.  OT continues to be indicated in the acute setting to address hip kit and ADL completion, and Patient will benefit from continued inpatient follow up therapy, <3 hours/day.    Recommendations for follow up therapy are one component of a multi-disciplinary discharge planning process, led by the attending physician.  Recommendations may be updated based on patient status, additional functional criteria and insurance authorization.    Assistance Recommended at Discharge Intermittent Supervision/Assistance  Patient can return home with the following  A little help with walking and/or transfers;A lot of help with bathing/dressing/bathroom;Assistance with cooking/housework;Assist for transportation;Help with stairs or ramp for entrance   Equipment Recommendations  BSC/3in1    Recommendations for Other Services      Precautions / Restrictions Precautions Precautions: Fall Restrictions Weight Bearing Restrictions: Yes LLE Weight Bearing: Weight bearing as tolerated       Mobility Bed Mobility Overal bed mobility: Needs Assistance Bed Mobility: Supine to Sit, Sit to Supine     Supine to sit: Supervision Sit to supine: Min assist        Transfers Overall transfer level: Needs assistance Equipment used: Rolling  walker (2 wheels) Transfers: Sit to/from Stand, Bed to chair/wheelchair/BSC Sit to Stand: Min guard     Step pivot transfers: Min guard           Balance Overall balance assessment: Needs assistance Sitting-balance support: No upper extremity supported, Feet supported Sitting balance-Leahy Scale: Good     Standing balance support: Reliant on assistive device for balance Standing balance-Leahy Scale: Poor                             ADL either performed or assessed with clinical judgement   ADL       Grooming: Oral care;Min guard;Standing   Upper Body Bathing: Set up;Sitting   Lower Body Bathing: Moderate assistance;Sitting/lateral leans   Upper Body Dressing : Set up;Sitting   Lower Body Dressing: Moderate assistance;Sit to/from stand   Toilet Transfer: Min guard;Rolling walker (2 wheels);Ambulation                  Extremity/Trunk Assessment Upper Extremity Assessment Upper Extremity Assessment: Overall WFL for tasks assessed                              Cognition Arousal/Alertness: Awake/alert Behavior During Therapy: WFL for tasks assessed/performed Overall Cognitive Status: Within Functional Limits for tasks assessed  Pertinent Vitals/ Pain       Pain Assessment Pain Assessment: Faces Faces Pain Scale: Hurts little more Pain Location: L hip Pain Descriptors / Indicators: Tender Pain Intervention(s): Monitored during session                                                          Frequency  Min 2X/week        Progress Toward Goals  OT Goals(current goals can now be found in the care plan section)  Progress towards OT goals: Progressing toward goals  Acute Rehab OT Goals OT Goal Formulation: With patient Time For Goal Achievement: 07/22/22 Potential to Achieve Goals: Good  Plan Discharge plan needs to  be updated    Co-evaluation                 AM-PAC OT "6 Clicks" Daily Activity     Outcome Measure   Help from another person eating meals?: None Help from another person taking care of personal grooming?: A Little Help from another person toileting, which includes using toliet, bedpan, or urinal?: A Little Help from another person bathing (including washing, rinsing, drying)?: A Lot Help from another person to put on and taking off regular upper body clothing?: None Help from another person to put on and taking off regular lower body clothing?: A Lot 6 Click Score: 18    End of Session Equipment Utilized During Treatment: Rolling walker (2 wheels)  OT Visit Diagnosis: Unsteadiness on feet (R26.81);Pain Pain - Right/Left: Left Pain - part of body: Hip   Activity Tolerance Patient tolerated treatment well   Patient Left in bed;with call bell/phone within reach   Nurse Communication Mobility status        Time: 0910-0930 OT Time Calculation (min): 20 min  Charges: OT General Charges $OT Visit: 1 Visit OT Treatments $Self Care/Home Management : 8-22 mins  07/17/2022  RP, OTR/L  Acute Rehabilitation Services  Office:  [phone]   Suzanna Obey 07/17/2022, 9:39 AM

## 2022-07-18 LAB — BASIC METABOLIC PANEL
Anion gap: 11 (ref 5–15)
BUN: 17 mg/dL (ref 8–23)
CO2: 27 mmol/L (ref 22–32)
Calcium: 9.1 mg/dL (ref 8.9–10.3)
Chloride: 99 mmol/L (ref 98–111)
Creatinine, Ser: 0.81 mg/dL (ref 0.61–1.24)
GFR, Estimated: >60 mL/min (ref >60)
Glucose, Bld: 125 mg/dL — ABNORMAL HIGH (ref 70–99)
Potassium: 3.1 mmol/L — ABNORMAL LOW (ref 3.5–5.1)
Sodium: 137 mmol/L (ref 135–145)

## 2022-07-18 LAB — MAGNESIUM: Magnesium: 1.5 mg/dL — ABNORMAL LOW (ref 1.7–2.4)

## 2022-07-18 LAB — CBC WITH DIFFERENTIAL/PLATELET
Abs Immature Granulocytes: 0.06 10*3/uL (ref 0.00–0.07)
Basophils Absolute: 0.1 10*3/uL (ref 0.0–0.1)
Basophils Relative: 1 %
Eosinophils Absolute: 0.4 10*3/uL (ref 0.0–0.5)
Eosinophils Relative: 3 %
HCT: 32.7 % — ABNORMAL LOW (ref 39.0–52.0)
Hemoglobin: 10.9 g/dL — ABNORMAL LOW (ref 13.0–17.0)
Immature Granulocytes: 1 %
Lymphocytes Relative: 31 %
Lymphs Abs: 3.3 10*3/uL (ref 0.7–4.0)
MCH: 28.5 pg (ref 26.0–34.0)
MCHC: 33.3 g/dL (ref 30.0–36.0)
MCV: 85.4 fL (ref 80.0–100.0)
Monocytes Absolute: 1.2 10*3/uL — ABNORMAL HIGH (ref 0.1–1.0)
Monocytes Relative: 11 %
Neutro Abs: 5.5 10*3/uL (ref 1.7–7.7)
Neutrophils Relative %: 53 %
Platelets: 443 10*3/uL — ABNORMAL HIGH (ref 150–400)
RBC: 3.83 MIL/uL — ABNORMAL LOW (ref 4.22–5.81)
RDW: 14.1 % (ref 11.5–15.5)
WBC: 10.4 10*3/uL (ref 4.0–10.5)
nRBC: 0 % (ref 0.0–0.2)

## 2022-07-18 LAB — GLUCOSE, CAPILLARY
Glucose-Capillary: 240 mg/dL — ABNORMAL HIGH (ref 70–99)
Glucose-Capillary: 327 mg/dL — ABNORMAL HIGH (ref 70–99)
Glucose-Capillary: 99 mg/dL (ref 70–99)

## 2022-07-18 MED ORDER — PANTOPRAZOLE SODIUM 40 MG PO TBEC: 40.0000 mg | DELAYED_RELEASE_TABLET | Freq: Two times a day (BID) | ORAL | 2 refills | Status: AC

## 2022-07-18 MED ORDER — ACETAMINOPHEN 325 MG PO TABS: 650.0000 mg | ORAL_TABLET | ORAL | Status: AC | PRN

## 2022-07-18 MED ORDER — MAGNESIUM SULFATE 2 GM/50ML IV SOLN
2.0000 g | Freq: Once | INTRAVENOUS | Status: AC
  Administered 2022-07-18: 2 g via INTRAVENOUS
  Filled 2022-07-18: qty 50

## 2022-07-18 MED ORDER — ZOLPIDEM TARTRATE 10 MG PO TABS: 10.0000 mg | ORAL_TABLET | Freq: Every day | ORAL | 0 refills | Status: AC

## 2022-07-18 MED ORDER — POTASSIUM CHLORIDE CRYS ER 20 MEQ PO TBCR
40.0000 meq | EXTENDED_RELEASE_TABLET | ORAL | Status: AC
  Administered 2022-07-18 (×2): 40 meq via ORAL
  Filled 2022-07-18 (×2): qty 2

## 2022-07-18 NOTE — Progress Notes (Signed)
Physical Therapy Treatment Patient Details Name: Alejandro Foster MRN: [id number] DOB: 1933-09-03 Today's Date: 07/18/2022   History of Present Illness 89 y.o. male admitted 4/18 s/p Lt hip fx and had INTRAMEDULLARY (IM) NAILING OF LEFT FEMUR with PMH significant for DM2, HTN, HLD, CAD/stents, DDD, lumbar spine stenosis, radiculopathy, chronic back pain, peripheral neuropathy, nephrolithiasis. Pt fell while loading his trailer.    PT Comments    Pt received in supine and agreeable to session. Pt demonstrating increased difficulty performing bed mobility with the HOB flat and no use of bed rails. However, pt able to complete without physical assist with increased cues and time. Pt able to tolerate increased gait distance with focus on L knee flexion and heel strike. Pt continues to be limited by L hip pain and decreased activity tolerance. Pt continues to benefit from PT services to progress toward functional mobility goals.    Recommendations for follow up therapy are one component of a multi-disciplinary discharge planning process, led by the attending physician.  Recommendations may be updated based on patient status, additional functional criteria and insurance authorization.     Assistance Recommended at Discharge Frequent or constant Supervision/Assistance  Patient can return home with the following A lot of help with walking and/or transfers;A little help with bathing/dressing/bathroom;Assistance with cooking/housework;Assist for transportation;Help with stairs or ramp for entrance   Equipment Recommendations  None recommended by PT    Recommendations for Other Services       Precautions / Restrictions Precautions Precautions: Fall Restrictions Weight Bearing Restrictions: Yes LLE Weight Bearing: Weight bearing as tolerated     Mobility  Bed Mobility Overal bed mobility: Needs Assistance Bed Mobility: Supine to Sit     Supine to sit: Min guard     General bed mobility  comments: Pt demonstrating increased difficulty with flat HOB and no use of bedrails. Cues for technique and sequencing with pt requiring increased time.    Transfers Overall transfer level: Needs assistance Equipment used: Rolling walker (2 wheels) Transfers: Sit to/from Stand Sit to Stand: Min guard           General transfer comment: From EOB with min guard for safety.    Ambulation/Gait Ambulation/Gait assistance: Min guard Gait Distance (Feet): 90 Feet Assistive device: Rolling walker (2 wheels) Gait Pattern/deviations: Step-to pattern, Decreased stride length, Decreased stance time - left, Decreased weight shift to left, Antalgic, Trunk flexed, Step-through pattern Gait velocity: decr     General Gait Details: Step-to pattern progressing to step-through intermittently with cues. Improved L knee flexion and heel strike with cues.     Balance Overall balance assessment: Needs assistance Sitting-balance support: No upper extremity supported, Feet supported Sitting balance-Leahy Scale: Good Sitting balance - Comments: sitting EOB   Standing balance support: Reliant on assistive device for balance, During functional activity, Bilateral upper extremity supported Standing balance-Leahy Scale: Poor Standing balance comment: With RW support                            Cognition Arousal/Alertness: Awake/alert Behavior During Therapy: WFL for tasks assessed/performed Overall Cognitive Status: Within Functional Limits for tasks assessed                                          Exercises      General Comments        Pertinent  Vitals/Pain Pain Assessment Pain Assessment: Faces Faces Pain Scale: Hurts little more Pain Location: L hip Pain Descriptors / Indicators: Tender, Aching, Guarding Pain Intervention(s): Monitored during session, Repositioned     PT Goals (current goals can now be found in the care plan section) Acute Rehab PT  Goals Patient Stated Goal: Get stronger at rehab, then go home PT Goal Formulation: With patient Time For Goal Achievement: 07/21/22 Potential to Achieve Goals: Good Progress towards PT goals: Progressing toward goals    Frequency    Min 5X/week      PT Plan Current plan remains appropriate       AM-PAC PT "6 Clicks" Mobility   Outcome Measure  Help needed turning from your back to your side while in a flat bed without using bedrails?: None Help needed moving from lying on your back to sitting on the side of a flat bed without using bedrails?: A Little Help needed moving to and from a bed to a chair (including a wheelchair)?: A Little Help needed standing up from a chair using your arms (e.g., wheelchair or bedside chair)?: A Little Help needed to walk in hospital room?: A Little Help needed climbing 3-5 steps with a railing? : A Lot 6 Click Score: 18    End of Session Equipment Utilized During Treatment: Gait belt Activity Tolerance: Patient tolerated treatment well Patient left: with call bell/phone within reach;in chair;with chair alarm set Nurse Communication: Mobility status PT Visit Diagnosis: Other abnormalities of gait and mobility (R26.89);Unsteadiness on feet (R26.81);Muscle weakness (generalized) (M62.81);History of falling (Z91.81);Difficulty in walking, not elsewhere classified (R26.2);Pain     Time: 1610-9604 PT Time Calculation (min) (ACUTE ONLY): 23 min  Charges:  $Gait Training: 23-37 mins                     Johny Shock, PTA Acute Rehabilitation Services Secure Chat Preferred  [phone]    Johny Shock 07/18/2022, 11:36 AM

## 2022-07-18 NOTE — TOC Transition Note (Addendum)
Transition of Care [facility]) - CM/SW Discharge Note   Patient Details  Name: AZARIA BARTELL MRN: [id number] Date of Birth: October 08, 1933  [facility]) CM/SW Contact:  Carley Hammed, LCSW Phone Number: 07/18/2022, 3:12 PM   Clinical Narrative:     Pt to be transported to [facility] via [place]. Nurse to call report to [phone]  Rm 209  Final next level of care: Skilled Nursing Facility Barriers to Discharge: Barriers Resolved   Patient Goals and CMS Choice CMS Medicare.gov Compare Post Acute Care list provided to:: Patient Choice offered to / list presented to : Patient  Discharge Placement                [facility] Patient to be transferred to facility by: [facility] Name of family member notified: patient Patient and family notified of of transfer: 07/18/22  Discharge Plan and Services Additional resources added to the After Visit Summary for       Post Acute Care Choice: Skilled Nursing Facility                               Social Determinants of Health (SDOH) Interventions SDOH Screenings   Tobacco Use: Medium Risk (07/16/2022)     Readmission Risk Interventions     No data to display

## 2022-07-18 NOTE — Plan of Care (Signed)
Problem: Education: Goal: Ability to describe self-care measures that may prevent or decrease complications (Diabetes Survival Skills Education) will improve Outcome: Progressing Goal: Individualized Educational Video(s) Outcome: Progressing   Problem: Coping: Goal: Ability to adjust to condition or change in health will improve Outcome: Progressing   Problem: Fluid Volume: Goal: Ability to maintain a balanced intake and output will improve Outcome: Progressing   Problem: Health Behavior/Discharge Planning: Goal: Ability to identify and utilize available resources and services will improve Outcome: Progressing Goal: Ability to manage health-related needs will improve Outcome: Progressing   Problem: Metabolic: Goal: Ability to maintain appropriate glucose levels will improve Outcome: Progressing   Problem: Nutritional: Goal: Maintenance of adequate nutrition will improve Outcome: Progressing Goal: Progress toward achieving an optimal weight will improve Outcome: Progressing   Problem: Skin Integrity: Goal: Risk for impaired skin integrity will decrease Outcome: Progressing   Problem: Tissue Perfusion: Goal: Adequacy of tissue perfusion will improve Outcome: Progressing   Problem: Education: Goal: Knowledge of General Education information will improve Description: Including pain rating scale, medication(s)/side effects and non-pharmacologic comfort measures Outcome: Progressing   Problem: Health Behavior/Discharge Planning: Goal: Ability to manage health-related needs will improve Outcome: Progressing   Problem: Clinical Measurements: Goal: Ability to maintain clinical measurements within normal limits will improve Outcome: Progressing Goal: Will remain free from infection Outcome: Progressing Goal: Diagnostic test results will improve Outcome: Progressing Goal: Respiratory complications will improve Outcome: Progressing Goal: Cardiovascular complication will  be avoided Outcome: Progressing   Problem: Activity: Goal: Risk for activity intolerance will decrease Outcome: Progressing   Problem: Nutrition: Goal: Adequate nutrition will be maintained Outcome: Progressing   Problem: Coping: Goal: Level of anxiety will decrease Outcome: Progressing   Problem: Elimination: Goal: Will not experience complications related to bowel motility Outcome: Progressing Goal: Will not experience complications related to urinary retention Outcome: Progressing   Problem: Pain Managment: Goal: General experience of comfort will improve Outcome: Progressing   Problem: Safety: Goal: Ability to remain free from injury will improve Outcome: Progressing   Problem: Skin Integrity: Goal: Risk for impaired skin integrity will decrease Outcome: Progressing   Problem: Education: Goal: Knowledge of General Education information will improve Description: Including pain rating scale, medication(s)/side effects and non-pharmacologic comfort measures Outcome: Progressing   Problem: Health Behavior/Discharge Planning: Goal: Ability to manage health-related needs will improve Outcome: Progressing   Problem: Clinical Measurements: Goal: Ability to maintain clinical measurements within normal limits will improve Outcome: Progressing Goal: Will remain free from infection Outcome: Progressing Goal: Diagnostic test results will improve Outcome: Progressing Goal: Respiratory complications will improve Outcome: Progressing Goal: Cardiovascular complication will be avoided Outcome: Progressing   Problem: Activity: Goal: Risk for activity intolerance will decrease Outcome: Progressing   Problem: Nutrition: Goal: Adequate nutrition will be maintained Outcome: Progressing   Problem: Coping: Goal: Level of anxiety will decrease Outcome: Progressing   Problem: Elimination: Goal: Will not experience complications related to bowel motility Outcome:  Progressing Goal: Will not experience complications related to urinary retention Outcome: Progressing   Problem: Pain Managment: Goal: General experience of comfort will improve Outcome: Progressing   Problem: Safety: Goal: Ability to remain free from injury will improve Outcome: Progressing   Problem: Skin Integrity: Goal: Risk for impaired skin integrity will decrease Outcome: Progressing   Problem: Education: Goal: Verbalization of understanding the information provided (i.e., activity precautions, restrictions, etc) will improve Outcome: Progressing Goal: Individualized Educational Video(s) Outcome: Progressing   Problem: Activity: Goal: Ability to ambulate and perform ADLs will improve Outcome: Progressing     Problem: Clinical Measurements: Goal: Postoperative complications will be avoided or minimized Outcome: Progressing   

## 2022-07-18 NOTE — Progress Notes (Signed)
Progress Note    Alejandro Foster   ZOX:[id number]  DOB: [birth year]  DOA: 07/06/2022     12 PCP: Creola Corn, MD  Initial CC: Mechanical fall  Hospital Course: Alejandro Foster is a 89 y.o. male with PMH significant for DM2, HTN, HLD, CAD/stents, DDD, lumbar spine stenosis, radiculopathy, chronic back pain, peripheral neuropathy, nephrolithiasis who lives at home and does not use any assistive devices to ambulate. 4/18, patient was brought to the ED by EMS after a fall at home.  Patient had stumbled over a trailer hitch in the driveway causing a mechanical fall.  This resulted in left hip pain after fall. X-rays in the ED showed comminuted displaced and angulated intertrochanteric fracture of the left hip Orthopedic surgery was consulted 4/19, underwent intramedullary nailing by Dr. Roda Shutters.   Interval History:  No events overnight.  Feeling better daily. BM improved also and more formed.  Awaiting insurance authorization for discharge.  Assessment and Plan:  Comminuted Displaced and Angulated Intertrochanteric Fracture of the Left Hip  - S/p intramedullary nailing 4/19 - Dr. Roda Shutters - continue pain control  - insurance appeal denied; patient accepted [facility]; okay for discharge once insurance auth received  -Continue working with PT until discharge   Severe esophagitis Patient complained of chronic heartburn and food stuck sensation.  He takes Pepcid PRN Mylanta at home.  His symptoms were especially worse last few days in the hospital.   GI was consulted.  Underwent EGD 4/23.  It showed severe acute and erosive esophagitis with no bleeding.  He also has erosive gastropathy with no bleeding and no stigmata of recent bleeding.  Biopsy taken.  Because of dysmotility that was noticed in esophagogram, botulinum toxin was injected. Patient to continue Protonix twice daily for 90 days.  Continue Mylanta as needed.  Avoid aspirin and NSAIDs To follow-up with GI as an outpatient.  Biopsy and H. pylori  test result.   Diarrhea -resolved - laxatives now PRN - will add imodium PRN also   Heat rash -improved - Continue supportive care - Okay for using shower stool to shower with assistance   Hypokalemia - replete as needed  Essential hypertension PTA on metoprolol titrate 25 mg daily, amlodipine 5 mg daily, HCTZ 25 mg daily, ramipril 20 mg daily Currently blood pressure and heart rate remain controlled with home regimen.   CAD/HLD Recent low risk stress test with Dr. Rosemary Holms PTA on aspirin 81 mg daily, statin and fibrate   Type 2 diabetes mellitus A1c 7.2% on 07/07/2022 PTA on Humalog 75/25 twice daily-35 units AM, 22 units p.m. also on glipizide 2.5 mg twice daily and metformin 500 mg twice daily. Currently on basal bolus regimen with Semglee 15 units twice daily, SSI/Accu-Cheks as well as glipizide and metformin. Continue Neurontin for neuropathy  Chronic back pain On Cymbalta 30 mg daily   Overactive bladder Vesicare   Insomnia Ambien nightly  Old records reviewed in assessment of this patient  Antimicrobials:   DVT prophylaxis:  enoxaparin (LOVENOX) injection 40 mg Start: 07/14/22 1200 SCDs Start: 07/07/22 1701 Place TED hose Start: 07/07/22 1701 SCDs Start: 07/07/22 0026   Code Status:   Code Status: Full Code  Mobility Assessment (last 72 hours)     Mobility Assessment     Row Name 07/18/22 1135 07/18/22 0855 07/17/22 2230 07/17/22 1339 07/17/22 0938   Does patient have an order for bedrest or is patient medically unstable -- No - Continue assessment No - Continue assessment -- --  What is the highest level of mobility based on the progressive mobility assessment? Level 5 (Walks with assist in room/hall) - Balance while stepping forward/back and can walk in room with assist - Complete Level 5 (Walks with assist in room/hall) - Balance while stepping forward/back and can walk in room with assist - Complete Level 5 (Walks with assist in room/hall) -  Balance while stepping forward/back and can walk in room with assist - Complete Level 5 (Walks with assist in room/hall) - Balance while stepping forward/back and can walk in room with assist - Complete Level 5 (Walks with assist in room/hall) - Balance while stepping forward/back and can walk in room with assist - Complete   Is the above level different from baseline mobility prior to current illness? -- Yes - Recommend PT order Yes - Recommend PT order -- --    Row Name 07/17/22 0933 07/16/22 1945 07/16/22 0904 07/15/22 1944     Does patient have an order for bedrest or is patient medically unstable No - Continue assessment No - Continue assessment No - Continue assessment No - Continue assessment    What is the highest level of mobility based on the progressive mobility assessment? Level 5 (Walks with assist in room/hall) - Balance while stepping forward/back and can walk in room with assist - Complete Level 5 (Walks with assist in room/hall) - Balance while stepping forward/back and can walk in room with assist - Complete Level 5 (Walks with assist in room/hall) - Balance while stepping forward/back and can walk in room with assist - Complete Level 5 (Walks with assist in room/hall) - Balance while stepping forward/back and can walk in room with assist - Complete    Is the above level different from baseline mobility prior to current illness? Yes - Recommend PT order Yes - Recommend PT order Yes - Recommend PT order --             Barriers to discharge:  Disposition Plan:  SNF Status is: Inpt  Objective: Blood pressure (!) 140/48, pulse 92, temperature 97.8 F (36.6 C), temperature source Oral, resp. rate 18, height 5\' 8"  (1.727 m), weight 81.6 kg, SpO2 98 %.  Examination:  Physical Exam Constitutional:      Appearance: Normal appearance.  HENT:     Head: Normocephalic and atraumatic.     Mouth/Throat:     Mouth: Mucous membranes are moist.  Eyes:     Extraocular Movements:  Extraocular movements intact.  Cardiovascular:     Rate and Rhythm: Normal rate and regular rhythm.  Pulmonary:     Effort: Pulmonary effort is normal. No respiratory distress.     Breath sounds: Normal breath sounds. No wheezing.  Abdominal:     General: Bowel sounds are normal. There is no distension.     Palpations: Abdomen is soft.     Tenderness: There is no abdominal tenderness.  Musculoskeletal:        General: Normal range of motion.     Cervical back: Normal range of motion and neck supple.     Comments: Bruising noted along the left lateral hip.  Compartments soft.  Appropriate tenderness to palpation  Skin:    General: Skin is warm and dry.  Neurological:     General: No focal deficit present.     Mental Status: He is alert.  Psychiatric:        Mood and Affect: Mood normal.      Consultants:  Orthopedic surgery GI  Procedures:  07/11/22: EGD 07/07/22: Open treatment of intertrochanteric fracture with intramedullary implant. CPT [phone]   Data Reviewed: Results for orders placed or performed during the hospital encounter of 07/06/22 (from the past 24 hour(s))  Glucose, capillary     Status: Abnormal   Collection Time: 07/17/22  4:34 PM  Result Value Ref Range   Glucose-Capillary 163 (H) 70 - 99 mg/dL  Glucose, capillary     Status: Abnormal   Collection Time: 07/17/22  9:39 PM  Result Value Ref Range   Glucose-Capillary 187 (H) 70 - 99 mg/dL  Basic metabolic panel     Status: Abnormal   Collection Time: 07/18/22  3:33 AM  Result Value Ref Range   Sodium 137 135 - 145 mmol/L   Potassium 3.1 (L) 3.5 - 5.1 mmol/L   Chloride 99 98 - 111 mmol/L   CO2 27 22 - 32 mmol/L   Glucose, Bld 125 (H) 70 - 99 mg/dL   BUN 17 8 - 23 mg/dL   Creatinine, Ser 6.04 0.61 - 1.24 mg/dL   Calcium 9.1 8.9 - 54.0 mg/dL   GFR, Estimated >98 >11 mL/min   Anion gap 11 5 - 15  CBC with Differential/Platelet     Status: Abnormal   Collection Time: 07/18/22  3:33 AM  Result Value Ref  Range   WBC 10.4 4.0 - 10.5 K/uL   RBC 3.83 (L) 4.22 - 5.81 MIL/uL   Hemoglobin 10.9 (L) 13.0 - 17.0 g/dL   HCT 91.4 (L) 78.2 - 95.6 %   MCV 85.4 80.0 - 100.0 fL   MCH 28.5 26.0 - 34.0 pg   MCHC 33.3 30.0 - 36.0 g/dL   RDW 21.3 08.6 - 57.8 %   Platelets 443 (H) 150 - 400 K/uL   nRBC 0.0 0.0 - 0.2 %   Neutrophils Relative % 53 %   Neutro Abs 5.5 1.7 - 7.7 K/uL   Lymphocytes Relative 31 %   Lymphs Abs 3.3 0.7 - 4.0 K/uL   Monocytes Relative 11 %   Monocytes Absolute 1.2 (H) 0.1 - 1.0 K/uL   Eosinophils Relative 3 %   Eosinophils Absolute 0.4 0.0 - 0.5 K/uL   Basophils Relative 1 %   Basophils Absolute 0.1 0.0 - 0.1 K/uL   Immature Granulocytes 1 %   Abs Immature Granulocytes 0.06 0.00 - 0.07 K/uL  Magnesium     Status: Abnormal   Collection Time: 07/18/22  3:33 AM  Result Value Ref Range   Magnesium 1.5 (L) 1.7 - 2.4 mg/dL  Glucose, capillary     Status: Abnormal   Collection Time: 07/18/22  7:55 AM  Result Value Ref Range   Glucose-Capillary 240 (H) 70 - 99 mg/dL  Glucose, capillary     Status: Abnormal   Collection Time: 07/18/22 11:49 AM  Result Value Ref Range   Glucose-Capillary 327 (H) 70 - 99 mg/dL    I have reviewed pertinent nursing notes, vitals, labs, and images as necessary. I have ordered labwork to follow up on as indicated.  I have reviewed the last notes from staff over past 24 hours. I have discussed patient's care plan and test results with nursing staff, CM/SW, and other staff as appropriate.    LOS: 12 days   Lewie Chamber, MD [facility] 07/18/2022, 2:23 PM

## 2022-07-18 NOTE — Discharge Summary (Addendum)
Physician Discharge Summary   Alejandro Foster NWG:[id number] DOB: 1933/05/15 DOA: 07/06/2022  PCP: Creola Corn, MD  Admit date: 07/06/2022 Discharge date: 07/18/2022  Admitted From: Home Disposition:  SNF Discharging physician: Lewie Chamber, MD Barriers to discharge: none  Recommendations at discharge: Follow up with orthopedic surgery  Discharge Condition: stable CODE STATUS: Full Diet recommendation:  Diet Orders (From admission, onward)     Start     Ordered   07/18/22 0000  Diet general        07/18/22 1502   07/11/22 0836  Diet regular Room service appropriate? Yes; Fluid consistency: Thin  Diet effective now       Question Answer Comment  Room service appropriate? Yes   Fluid consistency: Thin      07/11/22 0835            Hospital Course: Alejandro Foster is a 89 y.o. male with PMH significant for DM2, HTN, HLD, CAD/stents, DDD, lumbar spine stenosis, radiculopathy, chronic back pain, peripheral neuropathy, nephrolithiasis who lives at home and does not use any assistive devices to ambulate. 4/18, patient was brought to the ED by EMS after a fall at home.  Patient had stumbled over a trailer hitch in the driveway causing a mechanical fall.  This resulted in left hip pain after fall. X-rays in the ED showed comminuted displaced and angulated intertrochanteric fracture of the left hip Orthopedic surgery was consulted 4/19, underwent intramedullary nailing by Dr. Roda Shutters.   Assessment and Plan:  Comminuted Displaced and Angulated Intertrochanteric Fracture of the Left Hip  - S/p intramedullary nailing 4/19 - Dr. Roda Shutters - continue pain control  - insurance appeal denied; patient [facility]; okay for discharge once insurance auth received  -Continue working with PT   Severe esophagitis Patient complained of chronic heartburn and food stuck sensation.  He takes Pepcid PRN Mylanta at home.  His symptoms were especially worse last few days in the hospital.   GI was  consulted.  Underwent EGD 4/23.  It showed severe acute and erosive esophagitis with no bleeding.  He also has erosive gastropathy with no bleeding and no stigmata of recent bleeding.  Biopsy taken.  Because of dysmotility that was noticed in esophagogram, botulinum toxin was injected. Patient to continue Protonix twice daily for 90 days.  Continue Mylanta as needed.  Avoid NSAIDs; has remained on asa To follow-up with GI as an outpatient.  Biopsy and H. pylori test result.   Diarrhea -resolved   Heat rash -improved - Continue supportive care - Okay for using shower stool to shower with assistance   Hypokalemia - repleted   Essential hypertension PTA on metoprolol titrate 25 mg daily, amlodipine 5 mg daily, HCTZ 25 mg daily, ramipril 20 mg daily Currently blood pressure and heart rate remain controlled with home regimen.   CAD/HLD Recent low risk stress test with Dr. Rosemary Holms PTA on aspirin 81 mg daily, statin and fibrate   Type 2 diabetes mellitus A1c 7.2% on 07/07/2022 - continue home regimen  Continue Neurontin for neuropathy   Chronic back pain On Cymbalta 30 mg daily   Overactive bladder Vesicare   Insomnia Ambien nightly  Principal Diagnosis: Displaced intertrochanteric fracture of left femur, initial encounter for closed fracture [facility])  Discharge Diagnoses: Active Hospital Problems   Diagnosis Date Noted   Displaced intertrochanteric fracture of left femur, initial encounter for closed fracture (HCC) 07/06/2022   Fall 07/06/2022   CAD (coronary artery disease) 07/06/2022   Type 2 diabetes  mellitus (HCC)    Hyperlipidemia 10/02/2008    Resolved Hospital Problems  No resolved problems to display.     Discharge Instructions     Diet general   Complete by: As directed    Increase activity slowly   Complete by: As directed    Weight bearing as tolerated   Complete by: As directed       Allergies as of 07/18/2022       Reactions   Sulfa Antibiotics  Other (See Comments)   Unknown reaction- childhood allergy        Medication List     STOP taking these medications    ibuprofen 200 MG tablet Commonly known as: ADVIL       TAKE these medications    acetaminophen 325 MG tablet Commonly known as: TYLENOL Take 2 tablets (650 mg total) by mouth every 4 (four) hours as needed for mild pain or moderate pain. Gel caps What changed:  how much to take when to take this   amLODipine 5 MG tablet Commonly known as: NORVASC Take 5 mg by mouth every evening.   aspirin EC 81 MG tablet Take 81 mg by mouth daily.   atorvastatin 20 MG tablet Commonly known as: LIPITOR Take 20 mg by mouth every evening.   cetirizine 10 MG tablet Commonly known as: ZYRTEC Take 10 mg by mouth daily as needed for allergies.   DULoxetine 30 MG capsule Commonly known as: CYMBALTA Take 30 mg by mouth daily.   enoxaparin 40 MG/0.4ML injection Commonly known as: LOVENOX Inject 0.4 mLs (40 mg total) into the skin daily for 14 days.   fenofibrate 48 MG tablet Commonly known as: TRICOR Take 48 mg by mouth every evening.   gabapentin 100 MG capsule Commonly known as: NEURONTIN Take 100-200 mg by mouth 2 (two) times daily. Take 100 mg by mouth  at noon and then take  200 mg by mouth at night per patient   glipiZIDE-metformin 2.5-500 MG tablet Commonly known as: METAGLIP Take 2 tablets by mouth 2 (two) times daily before a meal.   HumaLOG Mix 75/25 KwikPen (75-25) 100 UNIT/ML Kwikpen Generic drug: Insulin Lispro Prot & Lispro Inject 22-40 Units into the skin See admin instructions. Take 40 units SQ in the morning and then take 22 units SQ in the evening.   hydrochlorothiazide 25 MG tablet Commonly known as: HYDRODIURIL Take 25 mg by mouth every morning.   Magnesium Citrate 125 MG Caps Take 150 mg by mouth daily.   metoprolol tartrate 25 MG tablet Commonly known as: LOPRESSOR Take 25 mg by mouth every morning.   oxyCODONE 5 MG immediate  release tablet Commonly known as: Oxy IR/ROXICODONE Take 1-2 tablets (5-10 mg total) by mouth every 8 (eight) hours as needed for severe pain.   pantoprazole 40 MG tablet Commonly known as: PROTONIX Take 1 tablet (40 mg total) by mouth 2 (two) times daily before a meal.   ramipril 10 MG capsule Commonly known as: ALTACE Take 20 mg by mouth daily.   solifenacin 5 MG tablet Commonly known as: VESICARE Take 5 mg by mouth every morning.   VITAMIN D PO Take 5 drops by mouth every evening. Unknown strength   zolpidem 10 MG tablet Commonly known as: AMBIEN Take 1 tablet (10 mg total) by mouth at bedtime.               Discharge Care Instructions  (From admission, onward)  Start     Ordered   07/07/22 0000  Weight bearing as tolerated        07/07/22 1347            Contact information for follow-up providers     Jari Sportsman L, PA-C Follow up in 2 week(s).   Specialty: Orthopedic Surgery Why: For suture removal, For wound re-check Contact information: [address] [place] [address] [phone]              Contact information for after-discharge care     Destination     [facility] .   Service: Skilled Nursing Contact information: [address] [place] [address] [phone]                    Allergies  Allergen Reactions   Sulfa Antibiotics Other (See Comments)    Unknown reaction- childhood allergy    Consultations: Ortho GI  Procedures: 07/11/22: EGD 07/07/22: Open treatment of intertrochanteric fracture with intramedullary implant. CPT [phone]   Discharge Exam: BP (!) 140/48 (BP Location: Left Arm)   Pulse 92   Temp 97.8 F (36.6 C) (Oral)   Resp 18   Ht 5\' 8"  (1.727 m)   Wt 81.6 kg   SpO2 98%   BMI 27.37 kg/m  Physical Exam Constitutional:      Appearance: Normal appearance.  HENT:     Head: Normocephalic and atraumatic.     Mouth/Throat:     Mouth: Mucous  membranes are moist.  Eyes:     Extraocular Movements: Extraocular movements intact.  Cardiovascular:     Rate and Rhythm: Normal rate and regular rhythm.  Pulmonary:     Effort: Pulmonary effort is normal. No respiratory distress.     Breath sounds: Normal breath sounds. No wheezing.  Abdominal:     General: Bowel sounds are normal. There is no distension.     Palpations: Abdomen is soft.     Tenderness: There is no abdominal tenderness.  Musculoskeletal:        General: Normal range of motion.     Cervical back: Normal range of motion and neck supple.     Comments: Bruising noted along the left lateral hip.  Compartments soft.  Appropriate tenderness to palpation  Skin:    General: Skin is warm and dry.  Neurological:     General: No focal deficit present.     Mental Status: He is alert.  Psychiatric:        Mood and Affect: Mood normal.      The results of significant diagnostics from this hospitalization (including imaging, microbiology, ancillary and laboratory) are listed below for reference.   Microbiology: No results found for this or any previous visit (from the past 240 hour(s)).   Labs: BNP (last 3 results) No results for input(s): "BNP" in the last 8760 hours. Basic Metabolic Panel: Recent Labs  Lab 07/14/22 0720 07/17/22 0039 07/18/22 0333  NA 137 134* 137  K 3.0* 2.7* 3.1*  CL 96* 99 99  CO2 26 26 27   GLUCOSE 189* 133* 125*  BUN 18 5* 17  CREATININE 0.85 0.99 0.81  CALCIUM 9.6 8.6* 9.1  MG  --  1.4* 1.5*   Liver Function Tests: No results for input(s): "AST", "ALT", "ALKPHOS", "BILITOT", "PROT", "ALBUMIN" in the last 168 hours. No results for input(s): "LIPASE", "AMYLASE" in the last 168 hours. No results for input(s): "AMMONIA" in the last 168 hours. CBC: Recent  Labs  Lab 07/14/22 0720 07/17/22 0039 07/18/22 0333  WBC 12.5* 12.0* 10.4  NEUTROABS 7.2 6.7 5.5  HGB 12.7* 10.6* 10.9*  HCT 36.9* 31.8* 32.7*  MCV 83.9 85.5 85.4  PLT 567* 440*  443*   Cardiac Enzymes: No results for input(s): "CKTOTAL", "CKMB", "CKMBINDEX", "TROPONINI" in the last 168 hours. BNP: Invalid input(s): "POCBNP" CBG: Recent Labs  Lab 07/17/22 1209 07/17/22 1634 07/17/22 2139 07/18/22 0755 07/18/22 1149  GLUCAP 201* 163* 187* 240* 327*   D-Dimer No results for input(s): "DDIMER" in the last 72 hours. Hgb A1c No results for input(s): "HGBA1C" in the last 72 hours. Lipid Profile No results for input(s): "CHOL", "HDL", "LDLCALC", "TRIG", "CHOLHDL", "LDLDIRECT" in the last 72 hours. Thyroid function studies No results for input(s): "TSH", "T4TOTAL", "T3FREE", "THYROIDAB" in the last 72 hours.  Invalid input(s): "FREET3" Anemia work up No results for input(s): "VITAMINB12", "FOLATE", "FERRITIN", "TIBC", "IRON", "RETICCTPCT" in the last 72 hours. Urinalysis    Component Value Date/Time   COLORURINE YELLOW 01/19/2016 1600   APPEARANCEUR CLEAR 01/19/2016 1600   LABSPEC 1.017 01/19/2016 1600   PHURINE 7.0 01/19/2016 1600   GLUCOSEU NEGATIVE 01/19/2016 1600   HGBUR TRACE (A) 01/19/2016 1600   BILIRUBINUR NEGATIVE 01/19/2016 1600   KETONESUR NEGATIVE 01/19/2016 1600   PROTEINUR NEGATIVE 01/19/2016 1600   NITRITE NEGATIVE 01/19/2016 1600   LEUKOCYTESUR SMALL (A) 01/19/2016 1600   Sepsis Labs Recent Labs  Lab 07/14/22 0720 07/17/22 0039 07/18/22 0333  WBC 12.5* 12.0* 10.4   Microbiology No results found for this or any previous visit (from the past 240 hour(s)).  Procedures/Studies: DG ESOPHAGUS W SINGLE CM (SOL OR THIN BA)  Result Date: 07/10/2022 CLINICAL DATA:  89 year old admitted with comminuted displaced and angulated intertrochanteric fracture of the left hip after mechanical fall, status post intramedullary nailing 07/07/2022. Patient referred for fluoroscopic esophagram study due to complaint of feeling that food is sticking in mid chest. He denies difficulty swallowing medications. EXAM: ESOPHAGUS/BARIUM SWALLOW/TABLET STUDY  TECHNIQUE: Single contrast examination was performed using thin liquid barium. This exam was performed by Alex Gardener, NP, and was supervised and interpreted by Leanna Battles, MD. FLUOROSCOPY: Radiation Exposure Index (as provided by the fluoroscopic device): 60.30 mGy Kerma COMPARISON:  None Available. FINDINGS: Note: Limited examination was performed due to patient condition. Swallowing: Appears normal. No vestibular penetration or aspiration seen. Pharynx: Unremarkable. Esophagus: No esophageal fold thickening, stricture or obstruction. Esophageal motility: Esophageal dysmotility with ineffective tertiary contractions. To and fro motion of barium and stasis of barium in esophagus with swallows. Hiatal Hernia: None. Gastroesophageal reflux: Spontaneous small volume gastroesophageal reflux noted to mid esophagus. Ingested 13mm barium tablet: 13 mm barium tablet would not pass beyond the distal esophagus despite multiple swallows of water and barium. Other: None. IMPRESSION: 1. Limited examination performed due to patient condition. 2. 13 mm barium tablet would not pass beyond the distal esophagus despite the aid of thin barium. However, an associated stricture is not readily identified during dynamic imaging. 3. Esophageal dysmotility. 4. Spontaneous gastroesophageal reflux. Read by: Alex Gardener, AGNP-BC Electronically Signed   By: Leanna Battles M.D.   On: 07/10/2022 14:55   DG FEMUR MIN 2 VIEWS LEFT  Result Date: 07/07/2022 CLINICAL DATA:  Left femoral nail EXAM: ORIF COMPARISON:  Preop x-ray 07/06/2022 FINDINGS: Four fluoroscopic spot images submitted for review demonstrates placement of a intramedullary rod with dynamic hip screw transfixing the intertrochanteric hip fracture. Imaging was obtained to aid in treatment. Please correlate with real-time fluoroscopy of 57  seconds. Cumulative dose 7.11 mGy IMPRESSION: Intraoperative fluoroscopy Electronically Signed   By: Karen Kays M.D.   On: 07/07/2022  16:33   DG C-Arm 1-60 Min-No Report  Result Date: 07/07/2022 Fluoroscopy was utilized by the requesting physician.  No radiographic interpretation.   Pelvis Portable  Result Date: 07/07/2022 CLINICAL DATA:  Postop left hip surgery EXAM: PORTABLE PELVIS 1-2 VIEWS COMPARISON:  06/26/2016 FINDINGS: IM rod and screw fixation across a left intertrochanteric femur fracture. Mild displacement of the lesser trochanter. Cutaneous staples and gas about the left hip. IMPRESSION: Expected postoperative changes of IM rod and screw fixation left femur. Electronically Signed   By: Minerva Fester M.D.   On: 07/07/2022 16:15   CT Head Wo Contrast  Result Date: 07/06/2022 CLINICAL DATA:  Head trauma, minor (Age >= 65y) EXAM: CT HEAD WITHOUT CONTRAST TECHNIQUE: Contiguous axial images were obtained from the base of the skull through the vertex without intravenous contrast. RADIATION DOSE REDUCTION: This exam was performed according to the departmental dose-optimization program which includes automated exposure control, adjustment of the mA and/or kV according to patient size and/or use of iterative reconstruction technique. COMPARISON:  None Available. FINDINGS: Brain: No evidence of acute infarction, hemorrhage, hydrocephalus, extra-axial collection or mass lesion/mass effect. Cerebral atrophy. Vascular: No hyperdense vessel. Skull: No acute fracture. Sinuses/Orbits: Mild paranasal sinus mucosal thickening. No acute orbital findings. Other: No mastoid effusions. IMPRESSION: No evidence of acute intracranial abnormality. Electronically Signed   By: Feliberto Harts M.D.   On: 07/06/2022 14:26   CT Cervical Spine Wo Contrast  Result Date: 07/06/2022 CLINICAL DATA:  Trauma EXAM: CT CERVICAL SPINE WITHOUT CONTRAST TECHNIQUE: Multidetector CT imaging of the cervical spine was performed without intravenous contrast. Multiplanar CT image reconstructions were also generated. RADIATION DOSE REDUCTION: This exam was performed  according to the departmental dose-optimization program which includes automated exposure control, adjustment of the mA and/or kV according to patient size and/or use of iterative reconstruction technique. COMPARISON:  None Available. FINDINGS: Alignment: Normal Skull base and vertebrae: No compression deformities. Osseous structures are osteopenic. No osteolytic or osteoblastic lesions. Soft tissues and spinal canal: No prevertebral fluid or swelling. No visible canal hematoma. Disc levels: Disc space narrowing with marginal osteophyte formation identified at C5-6 and C6-7. Facet joint osteoarthritic changes identified on the left C2-3, on the right C3-4, bilateral C4-5, bilateral C5-6, and bilaterally C7-T1. Upper chest: Negative. Other: None. IMPRESSION: Degenerative changes. Osteopenia. No acute traumatic abnormalities. Electronically Signed   By: Layla Maw M.D.   On: 07/06/2022 14:19   DG Hip Unilat W or Wo Pelvis 2-3 Views Left  Result Date: 07/06/2022 CLINICAL DATA:  Pain after fall EXAM: DG HIP (WITH OR WITHOUT PELVIS) 3V LEFT COMPARISON:  None Available. FINDINGS: Comminuted displaced and angulated intertrochanteric left hip fracture. No additional fracture or dislocation. Mild joint space loss of the hips. Mild degenerative changes of the left sacroiliac joint. Vascular calcifications. Prominent degenerative changes of the lumbar spine at the edge of the imaging field. IMPRESSION: Comminuted displaced and angulated intertrochanteric fracture of the left hip Electronically Signed   By: Karen Kays M.D.   On: 07/06/2022 13:27   PCV MYOCARDIAL PERFUSION WITH LEXISCAN  Result Date: 06/20/2022 Regadenoson (with Mod Bruce protocol) Nuclear stress test 06/19/2022 Myocardial perfusion is normal. Diaphragmatic attenuation noted in the inferior wall. Overall LV systolic function is normal without regional wall motion abnormalities. Stress LV EF: 56%. Low risk study. Nondiagnostic ECG stress. The heart  rate response was consistent with Regadenoson.  The blood pressure response was physiologic. No previous exam available for comparison.     Time coordinating discharge: Over 30 minutes    Lewie Chamber, MD  [facility] 07/18/2022, 3:08 PM

## 2022-07-18 NOTE — Plan of Care (Signed)
Problem: Education: Goal: Ability to describe self-care measures that may prevent or decrease complications (Diabetes Survival Skills Education) will improve 07/18/2022 1508 by Letta Moynahan, RN Outcome: Adequate for Discharge 07/18/2022 0945 by Letta Moynahan, RN Outcome: Progressing Goal: Individualized Educational Video(s) 07/18/2022 1508 by Letta Moynahan, RN Outcome: Adequate for Discharge 07/18/2022 0945 by Letta Moynahan, RN Outcome: Progressing   Problem: Coping: Goal: Ability to adjust to condition or change in health will improve 07/18/2022 1508 by Letta Moynahan, RN Outcome: Adequate for Discharge 07/18/2022 0945 by Letta Moynahan, RN Outcome: Progressing   Problem: Fluid Volume: Goal: Ability to maintain a balanced intake and output will improve 07/18/2022 1508 by Letta Moynahan, RN Outcome: Adequate for Discharge 07/18/2022 0945 by Letta Moynahan, RN Outcome: Progressing   Problem: Health Behavior/Discharge Planning: Goal: Ability to identify and utilize available resources and services will improve 07/18/2022 1508 by Letta Moynahan, RN Outcome: Adequate for Discharge 07/18/2022 0945 by Letta Moynahan, RN Outcome: Progressing Goal: Ability to manage health-related needs will improve 07/18/2022 1508 by Letta Moynahan, RN Outcome: Adequate for Discharge 07/18/2022 0945 by Letta Moynahan, RN Outcome: Progressing   Problem: Metabolic: Goal: Ability to maintain appropriate glucose levels will improve 07/18/2022 1508 by Letta Moynahan, RN Outcome: Adequate for Discharge 07/18/2022 0945 by Letta Moynahan, RN Outcome: Progressing   Problem: Nutritional: Goal: Maintenance of adequate nutrition will improve 07/18/2022 1508 by Letta Moynahan, RN Outcome: Adequate for Discharge 07/18/2022 0945 by Letta Moynahan, RN Outcome: Progressing Goal: Progress toward achieving an optimal weight will improve 07/18/2022 1508 by Letta Moynahan, RN Outcome: Adequate for Discharge 07/18/2022  0945 by Letta Moynahan, RN Outcome: Progressing   Problem: Skin Integrity: Goal: Risk for impaired skin integrity will decrease 07/18/2022 1508 by Letta Moynahan, RN Outcome: Adequate for Discharge 07/18/2022 0945 by Letta Moynahan, RN Outcome: Progressing   Problem: Tissue Perfusion: Goal: Adequacy of tissue perfusion will improve 07/18/2022 1508 by Letta Moynahan, RN Outcome: Adequate for Discharge 07/18/2022 0945 by Letta Moynahan, RN Outcome: Progressing   Problem: Education: Goal: Knowledge of General Education information will improve Description: Including pain rating scale, medication(s)/side effects and non-pharmacologic comfort measures 07/18/2022 1508 by Letta Moynahan, RN Outcome: Adequate for Discharge 07/18/2022 0945 by Letta Moynahan, RN Outcome: Progressing   Problem: Health Behavior/Discharge Planning: Goal: Ability to manage health-related needs will improve 07/18/2022 1508 by Letta Moynahan, RN Outcome: Adequate for Discharge 07/18/2022 0945 by Letta Moynahan, RN Outcome: Progressing   Problem: Clinical Measurements: Goal: Ability to maintain clinical measurements within normal limits will improve 07/18/2022 1508 by Letta Moynahan, RN Outcome: Adequate for Discharge 07/18/2022 0945 by Letta Moynahan, RN Outcome: Progressing Goal: Will remain free from infection 07/18/2022 1508 by Letta Moynahan, RN Outcome: Adequate for Discharge 07/18/2022 0945 by Letta Moynahan, RN Outcome: Progressing Goal: Diagnostic test results will improve 07/18/2022 1508 by Letta Moynahan, RN Outcome: Adequate for Discharge 07/18/2022 0945 by Letta Moynahan, RN Outcome: Progressing Goal: Respiratory complications will improve 07/18/2022 1508 by Letta Moynahan, RN Outcome: Adequate for Discharge 07/18/2022 0945 by Letta Moynahan, RN Outcome: Progressing Goal: Cardiovascular complication will be avoided 07/18/2022 1508 by Letta Moynahan, RN Outcome: Adequate for Discharge 07/18/2022 0945  by Letta Moynahan, RN Outcome: Progressing   Problem: Activity: Goal: Risk for activity intolerance will decrease 07/18/2022 1508 by Letta Moynahan, RN Outcome: Adequate for Discharge  07/18/2022 0945 by Letta Moynahan, RN Outcome: Progressing   Problem: Nutrition: Goal: Adequate nutrition will be maintained 07/18/2022 1508 by Letta Moynahan, RN Outcome: Adequate for Discharge 07/18/2022 0945 by Letta Moynahan, RN Outcome: Progressing   Problem: Coping: Goal: Level of anxiety will decrease 07/18/2022 1508 by Letta Moynahan, RN Outcome: Adequate for Discharge 07/18/2022 0945 by Letta Moynahan, RN Outcome: Progressing   Problem: Elimination: Goal: Will not experience complications related to bowel motility 07/18/2022 1508 by Letta Moynahan, RN Outcome: Adequate for Discharge 07/18/2022 0945 by Letta Moynahan, RN Outcome: Progressing Goal: Will not experience complications related to urinary retention 07/18/2022 1508 by Letta Moynahan, RN Outcome: Adequate for Discharge 07/18/2022 0945 by Letta Moynahan, RN Outcome: Progressing   Problem: Pain Managment: Goal: General experience of comfort will improve 07/18/2022 1508 by Letta Moynahan, RN Outcome: Adequate for Discharge 07/18/2022 0945 by Letta Moynahan, RN Outcome: Progressing   Problem: Safety: Goal: Ability to remain free from injury will improve 07/18/2022 1508 by Letta Moynahan, RN Outcome: Adequate for Discharge 07/18/2022 0945 by Letta Moynahan, RN Outcome: Progressing   Problem: Skin Integrity: Goal: Risk for impaired skin integrity will decrease 07/18/2022 1508 by Letta Moynahan, RN Outcome: Adequate for Discharge 07/18/2022 0945 by Letta Moynahan, RN Outcome: Progressing   Problem: Education: Goal: Knowledge of General Education information will improve Description: Including pain rating scale, medication(s)/side effects and non-pharmacologic comfort measures 07/18/2022 1508 by Letta Moynahan, RN Outcome: Adequate  for Discharge 07/18/2022 0945 by Letta Moynahan, RN Outcome: Progressing   Problem: Health Behavior/Discharge Planning: Goal: Ability to manage health-related needs will improve 07/18/2022 1508 by Letta Moynahan, RN Outcome: Adequate for Discharge 07/18/2022 0945 by Letta Moynahan, RN Outcome: Progressing   Problem: Clinical Measurements: Goal: Ability to maintain clinical measurements within normal limits will improve 07/18/2022 1508 by Letta Moynahan, RN Outcome: Adequate for Discharge 07/18/2022 0945 by Letta Moynahan, RN Outcome: Progressing Goal: Will remain free from infection 07/18/2022 1508 by Letta Moynahan, RN Outcome: Adequate for Discharge 07/18/2022 0945 by Letta Moynahan, RN Outcome: Progressing Goal: Diagnostic test results will improve 07/18/2022 1508 by Letta Moynahan, RN Outcome: Adequate for Discharge 07/18/2022 0945 by Letta Moynahan, RN Outcome: Progressing Goal: Respiratory complications will improve 07/18/2022 1508 by Letta Moynahan, RN Outcome: Adequate for Discharge 07/18/2022 0945 by Letta Moynahan, RN Outcome: Progressing Goal: Cardiovascular complication will be avoided 07/18/2022 1508 by Letta Moynahan, RN Outcome: Adequate for Discharge 07/18/2022 0945 by Letta Moynahan, RN Outcome: Progressing   Problem: Activity: Goal: Risk for activity intolerance will decrease 07/18/2022 1508 by Letta Moynahan, RN Outcome: Adequate for Discharge 07/18/2022 0945 by Letta Moynahan, RN Outcome: Progressing   Problem: Nutrition: Goal: Adequate nutrition will be maintained 07/18/2022 1508 by Letta Moynahan, RN Outcome: Adequate for Discharge 07/18/2022 0945 by Letta Moynahan, RN Outcome: Progressing   Problem: Coping: Goal: Level of anxiety will decrease 07/18/2022 1508 by Letta Moynahan, RN Outcome: Adequate for Discharge 07/18/2022 0945 by Letta Moynahan, RN Outcome: Progressing   Problem: Elimination: Goal: Will not experience complications related to bowel  motility 07/18/2022 1508 by Letta Moynahan, RN Outcome: Adequate for Discharge 07/18/2022 0945 by Letta Moynahan, RN Outcome: Progressing Goal: Will not experience complications related to urinary retention 07/18/2022 1508 by Letta Moynahan, RN Outcome: Adequate for Discharge 07/18/2022 0945 by Letta Moynahan, RN Outcome: Progressing  Problem: Pain Managment: Goal: General experience of comfort will improve 07/18/2022 1508 by Letta Moynahan, RN Outcome: Adequate for Discharge 07/18/2022 0945 by Letta Moynahan, RN Outcome: Progressing   Problem: Safety: Goal: Ability to remain free from injury will improve 07/18/2022 1508 by Letta Moynahan, RN Outcome: Adequate for Discharge 07/18/2022 0945 by Letta Moynahan, RN Outcome: Progressing   Problem: Skin Integrity: Goal: Risk for impaired skin integrity will decrease 07/18/2022 1508 by Letta Moynahan, RN Outcome: Adequate for Discharge 07/18/2022 0945 by Letta Moynahan, RN Outcome: Progressing   Problem: Education: Goal: Verbalization of understanding the information provided (i.e., activity precautions, restrictions, etc) will improve 07/18/2022 1508 by Letta Moynahan, RN Outcome: Adequate for Discharge 07/18/2022 0945 by Letta Moynahan, RN Outcome: Progressing Goal: Individualized Educational Video(s) 07/18/2022 1508 by Letta Moynahan, RN Outcome: Adequate for Discharge 07/18/2022 0945 by Letta Moynahan, RN Outcome: Progressing   Problem: Activity: Goal: Ability to ambulate and perform ADLs will improve 07/18/2022 1508 by Letta Moynahan, RN Outcome: Adequate for Discharge 07/18/2022 0945 by Letta Moynahan, RN Outcome: Progressing   Problem: Clinical Measurements: Goal: Postoperative complications will be avoided or minimized 07/18/2022 1508 by Letta Moynahan, RN Outcome: Adequate for Discharge 07/18/2022 0945 by Letta Moynahan, RN Outcome: Progressing   Problem: Self-Concept: Goal: Ability to maintain and perform role  responsibilities to the fullest extent possible will improve Outcome: Adequate for Discharge   Problem: Pain Management: Goal: Pain level will decrease Outcome: Adequate for Discharge

## 2022-07-25 ENCOUNTER — Ambulatory Visit (INDEPENDENT_AMBULATORY_CARE_PROVIDER_SITE_OTHER): Payer: Medicare Other | Admitting: Orthopaedic Surgery

## 2022-07-25 ENCOUNTER — Other Ambulatory Visit (INDEPENDENT_AMBULATORY_CARE_PROVIDER_SITE_OTHER): Payer: Medicare Other

## 2022-07-25 ENCOUNTER — Encounter: Payer: Self-pay | Admitting: Orthopaedic Surgery

## 2022-07-25 DIAGNOSIS — M898X5 Other specified disorders of bone, thigh: Secondary | ICD-10-CM | POA: Diagnosis not present

## 2022-07-25 NOTE — Progress Notes (Signed)
Post-Op Visit Note   Patient: Alejandro Foster           Date of Birth: 1933/10/06           MRN: [id number] Visit Date: 07/25/2022 PCP: Creola Corn, MD   Assessment & Plan:  Chief Complaint:  Chief Complaint  Patient presents with   Left Leg - Follow-up    Left femoral IM nailing 07/07/2022   Visit Diagnoses:  1. Pain of left femur     Plan: Patient is 2 weeks status post left intertrochanteric hip fracture.  He is at [facility] rehab.  Overall no complaints.  Examination left hip shows healed surgical incisions.  No signs of infection or drainage.  Staples removed Steri-Strips applied.  Continue weight-bear as tolerated and PT at his facility.  Recheck in 4 weeks with two-view x-rays of the left femur.  Follow-Up Instructions: No follow-ups on file.   Orders:  Orders Placed This Encounter  Procedures   XR FEMUR MIN 2 VIEWS LEFT   No orders of the defined types were placed in this encounter.   Imaging: XR FEMUR MIN 2 VIEWS LEFT  Result Date: 07/25/2022 2 view x-rays of the femur show stable alignment and fixation of intertrochanteric fracture with cephalomedullary device.  No complications.   PMFS History: Patient Active Problem List   Diagnosis Date Noted   Fall 07/06/2022   Displaced intertrochanteric fracture of left femur, initial encounter for closed fracture (HCC) 07/06/2022   CAD (coronary artery disease) 07/06/2022   Coronary artery disease 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) 05/12/2022   Paving stone retinal degeneration, bilateral 11/03/2021   Senile purpura (HCC) 09/07/2020   Claudication (HCC) 03/17/2020   Wears hearing aid    Type 2 diabetes mellitus (HCC)    S/P coronary artery stent placement    Right ureteral stone    Right leg weakness    Lumbar radiculopathy, right    History of kidney stones    DDD (degenerative disc disease), lumbar    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) 11/10/2019   Pseudophakia of both eyes 11/10/2019   Posterior vitreous detachment of both eyes 11/10/2019   Amnesia 09/19/2019   Dyspnea on exertion 08/22/2019   Atypical chest pain 08/22/2019   Diabetic retinopathy associated with type 2 diabetes mellitus (HCC) 03/27/2019   Trigger finger 05/28/2018   Dyslipidemia 01/25/2018   Diabetic peripheral neuropathy associated with type 2 diabetes mellitus (HCC) 12/12/2017   Chronic bilateral thoracic back pain 12/12/2017   Myofascial pain syndrome 12/12/2017   Foraminal stenosis of lumbar region 12/12/2017   Spondylosis without myelopathy or radiculopathy, lumbar region 12/12/2017   Cough 09/17/2017   Irritability and anger 09/17/2017   Localized edema 08/01/2017   Abnormal gait 01/09/2017   Fatigue 09/12/2016   Hypertensive heart disease without congestive heart failure 09/03/2014   Long term (current) use of insulin (HCC) 09/03/2014   Ventricular premature depolarization 09/03/2014   Encounter for general adult medical examination without abnormal findings 08/26/2014   Gout 08/28/2013   Impacted cerumen 08/28/2013   Hypo-osmolality and hyponatremia 08/28/2013   Urge incontinence of urine 08/26/2012   Age-related osteoporosis without current pathological fracture 02/19/2012   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   Vitamin D deficiency 02/19/2012   Underimmunization status 12/26/2011   Backache 08/21/2011   Hearing loss 05/23/2011   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) 03/22/2010  Kidney stone 05/25/2009   ED (erectile dysfunction) of organic origin 05/25/2009   Obesity 05/25/2009   Metabolic syndrome 05/25/2009   Polyneuropathy 12/25/2008   Hyperglycemia due to type 2 diabetes mellitus (HCC) 12/25/2008   Gastro-esophageal reflux disease without esophagitis 12/25/2008   Insomnia 12/25/2008   Dilatation of aorta (HCC) 10/02/2008   Essential hypertension  10/02/2008   Hyperlipidemia 10/02/2008   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) 11/10/2019   Coronary artery disease    cardiologist-  dr Donnie Aho--  s/p  cardiac cath's w/ angioplasty and stenting x2   DDD (degenerative disc disease), lumbar    Diabetic peripheral neuropathy associated with type 2 diabetes mellitus (HCC) 12/12/2017   Dyslipidemia 01/25/2018   Dyspnea on exertion 08/22/2019   Foraminal stenosis of lumbar region 12/12/2017   History of kidney stones    Hyperlipidemia    Hypertension    Lumbar radiculopathy, right    right leg weakness   Myofascial pain syndrome 12/12/2017   Nephrolithiasis    bilateral non-obstructive per ct 01-19-2016   Peripheral neuropathy    feet   Posterior vitreous detachment of both eyes 11/10/2019   Pseudophakia of both eyes 11/10/2019   Right leg weakness    due to lumbar radiculopathy   Right ureteral stone    S/P coronary artery stent placement    2002 x1  and 2003 x1   Spondylosis without myelopathy or radiculopathy, lumbar region 12/12/2017   Trigger finger 05/28/2018   Type 2 diabetes mellitus (HCC)    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) 11/10/2019   Wears hearing aid    BILATERAL    Family History  Problem Relation Age of Onset   Gallbladder disease Father    Cancer Brother     Past Surgical History:  Procedure Laterality Date   BIOPSY  07/11/2022   Procedure: BIOPSY;  Surgeon: Kerin Salen, MD;  Location: [facility] ENDOSCOPY;  Service: Gastroenterology;;   CARDIAC CATHETERIZATION  01/ 2002   dr Donnie Aho   mLAD 30%,  CFX OM3 40%,  RCA 30% previous stent site   CARDIOVASCULAR STRESS TEST  04-15-2009  dr Donnie Aho   normal nuclear study w/ no ischemia/  normal LV  function and wall motion , ef 77%   CATARACT EXTRACTION W/ INTRAOCULAR LENS  IMPLANT, BILATERAL  2013  approx.   COLONOSCOPY WITH PROPOFOL  2014   CORONARY ANGIOPLASTY  1990  dr Donnie Aho   PTCA to OM2   CORONARY ANGIOPLASTY  10/ 1996  dr Donnie Aho   PTCA to RCA   CORONARY ANGIOPLASTY WITH STENT PLACEMENT  04/ 1996  dr Donnie Aho   stenting to RCA   CYSTOSCOPY WITH URETEROSCOPY AND STENT PLACEMENT Right 03/28/2016   Procedure: CYSTOSCOPY WITH RIGHT  URETEROSCOPY AND STENT PLACEMENT;  Surgeon: Jerilee Field, MD;  Location: [facility];  Service: Urology;  Laterality: Right;   ESOPHAGOGASTRODUODENOSCOPY (EGD) WITH PROPOFOL N/A 07/11/2022   Procedure: ESOPHAGOGASTRODUODENOSCOPY (EGD) WITH PROPOFOL;  Surgeon: Kerin Salen, MD;  Location: [facility] ENDOSCOPY;  Service: Gastroenterology;  Laterality: N/A;   EXTRACORPOREAL SHOCK WAVE LITHOTRIPSY  yrs ago   HOLMIUM LASER APPLICATION Right 03/28/2016   Procedure: HOLMIUM LASER APPLICATION;  Surgeon: Jerilee Field, MD;  Location: [facility];  Service: Urology;  Laterality: Right;   INTRAMEDULLARY (IM) NAIL INTERTROCHANTERIC Left 07/07/2022   Procedure: INTRAMEDULLARY (IM) NAILING OF LEFT FEMUR;  Surgeon: Tarry Kos, MD;  Location: [facility] OR;  Service: Orthopedics;  Laterality: Left;   LAPAROSCOPIC CHOLECYSTECTOMY  1990's   SUBMUCOSAL INJECTION  07/11/2022   Procedure: SUBMUCOSAL INJECTION;  Surgeon: Kerin Salen, MD;  Location: [facility] ENDOSCOPY;  Service: Gastroenterology;;   TONSILLECTOMY AND ADENOIDECTOMY  child   URETEROLITHOTOMY  1990's   Social History   Occupational History   Not on file  Tobacco Use   Smoking status: Former    Packs/day: 1.00    Years: 40.00    Additional pack years: 0.00    Total pack years: 40.00    Types: Cigarettes    Quit date: 08/26/1988    Years since quitting: 33.9   Smokeless tobacco: Never  Vaping Use   Vaping Use: Never used  Substance and Sexual Activity   Alcohol use: Yes    Comment: rare    Drug use: No   Sexual activity: Not on file

## 2022-07-31 ENCOUNTER — Telehealth: Payer: Self-pay | Admitting: Orthopaedic Surgery

## 2022-07-31 NOTE — Telephone Encounter (Signed)
Called patient. No answer. LMOM for patient.

## 2022-07-31 NOTE — Telephone Encounter (Signed)
Just for two weeks post-op so he should be good

## 2022-07-31 NOTE — Telephone Encounter (Signed)
Patient is taking enoxaparin and would like to know does he continue taking it and if so when can he refill, please advise

## 2022-08-08 ENCOUNTER — Telehealth: Payer: Self-pay | Admitting: Orthopaedic Surgery

## 2022-08-08 DIAGNOSIS — M898X5 Other specified disorders of bone, thigh: Secondary | ICD-10-CM

## 2022-08-08 NOTE — Telephone Encounter (Signed)
Yes ok for PT.  WBAT.  Gait training.  Hip strengthening.

## 2022-08-08 NOTE — Telephone Encounter (Signed)
[facility] P/T asking for the referral for the patient please fax to --[phone]

## 2022-08-09 NOTE — Addendum Note (Signed)
Addended by: Barbette Or on: 08/09/2022 09:09 AM   Modules accepted: Orders

## 2022-08-09 NOTE — Telephone Encounter (Signed)
I faxed this referral. Can you please make sure they get it?

## 2022-08-22 ENCOUNTER — Other Ambulatory Visit (INDEPENDENT_AMBULATORY_CARE_PROVIDER_SITE_OTHER): Payer: Medicare Other

## 2022-08-22 ENCOUNTER — Ambulatory Visit (INDEPENDENT_AMBULATORY_CARE_PROVIDER_SITE_OTHER): Payer: Medicare Other | Admitting: Physician Assistant

## 2022-08-22 DIAGNOSIS — S72142A Displaced intertrochanteric fracture of left femur, initial encounter for closed fracture: Secondary | ICD-10-CM | POA: Diagnosis not present

## 2022-08-22 DIAGNOSIS — M898X5 Other specified disorders of bone, thigh: Secondary | ICD-10-CM

## 2022-08-22 MED ORDER — TRAMADOL HCL 50 MG PO TABS: 50.0000 mg | ORAL_TABLET | Freq: Three times a day (TID) | ORAL | 2 refills | Status: DC | PRN

## 2022-08-22 NOTE — Progress Notes (Signed)
Post-Op Visit Note   Patient: Alejandro Foster           Date of Birth: [birth year]           MRN: [id number] Visit Date: 08/22/2022 PCP: Creola Corn, MD   Assessment & Plan:  Chief Complaint:  Chief Complaint  Patient presents with   Left Leg - Routine Post Op   Visit Diagnoses:  1. Displaced intertrochanteric fracture of left femur, initial encounter for closed fracture (HCC)   2. Pain of left femur     Plan: Patient is a pleasant 89 year old gentleman who comes in today approximately 6 weeks status post left hip IM nail from an intertrochanteric femur fracture, date of surgery 07/07/2022.  He is doing relatively well in regards to the hip.  He does note occasional discomfort to the groin.  Most of his pain has been to the knee.  He is taking Tylenol for this.  He has been getting physical therapy and is ambulating with a walker.  He is ambulating unassisted prior to his fall.  Examination of his left hip reveals painless hip flexion and logroll.  Left knee exam is unremarkable.  He is neurovascularly intact distally.  At this point, believe his knee pain is likely referred from his hip.  He will continue to work with physical therapy.  He will follow-up with [place] in 6 weeks for repeat evaluation and x-rays of the left femur.  Call with concerns or questions in the meantime.  Follow-Up Instructions: Return in about 6 weeks (around 10/03/2022).   Orders:  Orders Placed This Encounter  Procedures   XR FEMUR MIN 2 VIEWS LEFT   Meds ordered this encounter  Medications   traMADol (ULTRAM) 50 MG tablet    Sig: Take 1 tablet (50 mg total) by mouth 3 (three) times daily as needed.    Dispense:  30 tablet    Refill:  2    Imaging: XR FEMUR MIN 2 VIEWS LEFT  Result Date: 08/22/2022 X-rays demonstrate consolidation of the fracture without hardware complication   PMFS History: Patient Active Problem List   Diagnosis Date Noted   Fall 07/06/2022   Displaced intertrochanteric fracture of  left femur, initial encounter for closed fracture (HCC) 07/06/2022   CAD (coronary artery disease) 07/06/2022   Coronary artery disease 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) 05/12/2022   Paving stone retinal degeneration, bilateral 11/03/2021   Senile purpura (HCC) 09/07/2020   Claudication (HCC) 03/17/2020   Wears hearing aid    Type 2 diabetes mellitus (HCC)    S/P coronary artery stent placement    Right ureteral stone    Right leg weakness    Lumbar radiculopathy, right    History of kidney stones    DDD (degenerative disc disease), lumbar    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) 11/10/2019   Pseudophakia of both eyes 11/10/2019   Posterior vitreous detachment of both eyes 11/10/2019   Amnesia 09/19/2019   Dyspnea on exertion 08/22/2019   Atypical chest pain 08/22/2019   Diabetic retinopathy associated with type 2 diabetes mellitus (HCC) 03/27/2019   Trigger finger 05/28/2018   Dyslipidemia 01/25/2018   Diabetic peripheral neuropathy associated with type 2 diabetes mellitus (HCC) 12/12/2017   Chronic bilateral thoracic back pain 12/12/2017   Myofascial pain syndrome 12/12/2017   Foraminal stenosis of lumbar region 12/12/2017   Spondylosis without myelopathy or radiculopathy, lumbar region  12/12/2017   Cough 09/17/2017   Irritability and anger 09/17/2017   Localized edema 08/01/2017   Abnormal gait 01/09/2017   Fatigue 09/12/2016   Hypertensive heart disease without congestive heart failure 09/03/2014   Long term (current) use of insulin (HCC) 09/03/2014   Ventricular premature depolarization 09/03/2014   Encounter for general adult medical examination without abnormal findings 08/26/2014   Gout 08/28/2013   Impacted cerumen 08/28/2013   Hypo-osmolality and hyponatremia 08/28/2013   Urge incontinence of urine 08/26/2012   Age-related osteoporosis without current pathological  fracture 02/19/2012   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   Vitamin D deficiency 02/19/2012   Underimmunization status 12/26/2011   Backache 08/21/2011   Hearing loss 05/23/2011   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) 03/22/2010   Kidney stone 05/25/2009   ED (erectile dysfunction) of organic origin 05/25/2009   Obesity 05/25/2009   Metabolic syndrome 05/25/2009   Polyneuropathy 12/25/2008   Hyperglycemia due to type 2 diabetes mellitus (HCC) 12/25/2008   Gastro-esophageal reflux disease without esophagitis 12/25/2008   Insomnia 12/25/2008   Dilatation of aorta (HCC) 10/02/2008   Essential hypertension 10/02/2008   Hyperlipidemia 10/02/2008   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) 11/10/2019   Coronary artery disease    cardiologist-  dr Donnie Aho--  s/p  cardiac cath's w/ angioplasty and stenting x2   DDD (degenerative disc disease), lumbar    Diabetic peripheral neuropathy associated with type 2 diabetes mellitus (HCC) 12/12/2017   Dyslipidemia 01/25/2018   Dyspnea on exertion 08/22/2019   Foraminal stenosis of lumbar region 12/12/2017   History of kidney stones    Hyperlipidemia    Hypertension    Lumbar radiculopathy, right    right leg weakness   Myofascial pain syndrome 12/12/2017   Nephrolithiasis    bilateral non-obstructive per ct 01-19-2016   Peripheral neuropathy    feet   Posterior vitreous detachment of both eyes 11/10/2019   Pseudophakia of both eyes 11/10/2019   Right leg weakness    due to lumbar radiculopathy   Right ureteral stone    S/P coronary artery stent placement    2002 x1  and 2003 x1   Spondylosis without myelopathy or radiculopathy, lumbar region 12/12/2017   Trigger finger 05/28/2018   Type 2  diabetes mellitus (HCC)    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) 11/10/2019   Wears hearing aid    BILATERAL    Family History  Problem Relation Age of Onset   Gallbladder disease Father    Cancer Brother     Past Surgical History:  Procedure Laterality Date   BIOPSY  07/11/2022   Procedure: BIOPSY;  Surgeon: Kerin Salen, MD;  Location: [facility] ENDOSCOPY;  Service: Gastroenterology;;   CARDIAC CATHETERIZATION  01/ 2002   dr Donnie Aho   mLAD 30%,  CFX OM3 40%,  RCA 30% previous stent site   CARDIOVASCULAR STRESS TEST  04-15-2009  dr Donnie Aho   normal nuclear study w/ no ischemia/  normal LV function and wall motion , ef 77%   CATARACT EXTRACTION W/ INTRAOCULAR LENS  IMPLANT, BILATERAL  2013  approx.   COLONOSCOPY WITH PROPOFOL  2014   CORONARY ANGIOPLASTY  1990  dr Donnie Aho   PTCA to OM2  CORONARY ANGIOPLASTY  10/ 1996  dr Donnie Aho   PTCA to RCA   CORONARY ANGIOPLASTY WITH STENT PLACEMENT  04/ 1996  dr Donnie Aho   stenting to RCA   CYSTOSCOPY WITH URETEROSCOPY AND STENT PLACEMENT Right 03/28/2016   Procedure: CYSTOSCOPY WITH RIGHT  URETEROSCOPY AND STENT PLACEMENT;  Surgeon: Jerilee Field, MD;  Location[facility];  Service: Urology;  Laterality: Right;   ESOPHAGOGASTRODUODENOSCOPY (EGD) WITH PROPOFOL N/A 07/11/2022   Procedure: ESOPHAGOGASTRODUODENOSCOPY (EGD) WITH PROPOFOL;  Surgeon: Kerin Salen, MD;  Location: [facility] ENDOSCOPY;  Service: Gastroenterology;  Laterality: N/A;   EXTRACORPOREAL SHOCK WAVE LITHOTRIPSY  yrs ago   HOLMIUM LASER APPLICATION Right 03/28/2016   Procedure: HOLMIUM LASER APPLICATION;  Surgeon: Jerilee Field, MD;  Location: [facility];  Service: Urology;  Laterality: Right;   INTRAMEDULLARY (IM) NAIL INTERTROCHANTERIC Left 07/07/2022   Procedure: INTRAMEDULLARY (IM) NAILING OF LEFT FEMUR;  Surgeon: Tarry Kos, MD;  Location: [facility] OR;  Service: Orthopedics;  Laterality: Left;    LAPAROSCOPIC CHOLECYSTECTOMY  1990's   SUBMUCOSAL INJECTION  07/11/2022   Procedure: SUBMUCOSAL INJECTION;  Surgeon: Kerin Salen, MD;  Location: [facility] ENDOSCOPY;  Service: Gastroenterology;;   TONSILLECTOMY AND ADENOIDECTOMY  child   URETEROLITHOTOMY  1990's   Social History   Occupational History   Not on file  Tobacco Use   Smoking status: Former    Packs/day: 1.00    Years: 40.00    Additional pack years: 0.00    Total pack years: 40.00    Types: Cigarettes    Quit date: 08/26/1988    Years since quitting: 34.0   Smokeless tobacco: Never  Vaping Use   Vaping Use: Never used  Substance and Sexual Activity   Alcohol use: Yes    Comment: rare   Drug use: No   Sexual activity: Not on file

## 2022-10-03 ENCOUNTER — Ambulatory Visit: Payer: Medicare Other | Admitting: Physician Assistant

## 2022-10-03 ENCOUNTER — Other Ambulatory Visit (INDEPENDENT_AMBULATORY_CARE_PROVIDER_SITE_OTHER): Payer: Medicare Other

## 2022-10-03 DIAGNOSIS — S72142A Displaced intertrochanteric fracture of left femur, initial encounter for closed fracture: Secondary | ICD-10-CM

## 2022-10-03 MED ORDER — TRAMADOL HCL 50 MG PO TABS: 50.0000 mg | ORAL_TABLET | Freq: Two times a day (BID) | ORAL | 2 refills | Status: DC | PRN

## 2022-10-03 NOTE — Progress Notes (Signed)
Post-Op Visit Note   Patient: Alejandro Foster           Date of Birth: 1933-08-07           MRN: [id number] Visit Date: 10/03/2022 PCP: Creola Corn, MD   Assessment & Plan:  Chief Complaint:  Chief Complaint  Patient presents with   Left Leg - Routine Post Op   Visit Diagnoses:  1. Displaced intertrochanteric fracture of left femur, initial encounter for closed fracture [facility])     Plan: Patient is a pleasant 89 year old gentleman who comes in today 12 weeks status post left hip IM nail 07/07/2022.  He has been doing well.  He is taking tramadol at night as this is when he has pain.  Overall he is feeling much better.  His knee pain has dissipated.  He is in outpatient physical therapy making good progress.  Currently ambulating with a single-point cane.  Of note, he was ambulating unassisted prior to the fall.  Examination of the left hip reveals painless hip flexion and logroll.  He is neurovascular intact distally.  At this point, he will continue with physical therapy.  He will follow-up with [place] in 2 months for repeat evaluation and x-rays of the left femur.  Call with concerns or questions.  Follow-Up Instructions: Return in about 2 months (around 12/04/2022).   Orders:  Orders Placed This Encounter  Procedures   XR FEMUR MIN 2 VIEWS LEFT   No orders of the defined types were placed in this encounter.   Imaging: No results found.  PMFS History: Patient Active Problem List   Diagnosis Date Noted   Fall 07/06/2022   Displaced intertrochanteric fracture of left femur, initial encounter for closed fracture (HCC) 07/06/2022   CAD (coronary artery disease) 07/06/2022   Coronary artery disease 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) 05/12/2022   Paving stone retinal degeneration, bilateral 11/03/2021   Senile purpura (HCC) 09/07/2020   Claudication (HCC) 03/17/2020   Wears hearing aid    Type 2 diabetes mellitus (HCC)    S/P coronary artery stent  placement    Right ureteral stone    Right leg weakness    Lumbar radiculopathy, right    History of kidney stones    DDD (degenerative disc disease), lumbar    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) 11/10/2019   Pseudophakia of both eyes 11/10/2019   Posterior vitreous detachment of both eyes 11/10/2019   Amnesia 09/19/2019   Dyspnea on exertion 08/22/2019   Atypical chest pain 08/22/2019   Diabetic retinopathy associated with type 2 diabetes mellitus (HCC) 03/27/2019   Trigger finger 05/28/2018   Dyslipidemia 01/25/2018   Diabetic peripheral neuropathy associated with type 2 diabetes mellitus (HCC) 12/12/2017   Chronic bilateral thoracic back pain 12/12/2017   Myofascial pain syndrome 12/12/2017   Foraminal stenosis of lumbar region 12/12/2017   Spondylosis without myelopathy or radiculopathy, lumbar region 12/12/2017   Cough 09/17/2017   Irritability and anger 09/17/2017   Localized edema 08/01/2017   Abnormal gait 01/09/2017   Fatigue 09/12/2016   Hypertensive heart disease without congestive heart failure 09/03/2014   Long term (current) use of insulin (HCC) 09/03/2014   Ventricular premature depolarization 09/03/2014   Encounter for general adult medical examination without abnormal findings 08/26/2014   Gout 08/28/2013   Impacted cerumen 08/28/2013   Hypo-osmolality and hyponatremia 08/28/2013   Urge incontinence of urine 08/26/2012  Age-related osteoporosis without current pathological fracture 02/19/2012   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   Vitamin D deficiency 02/19/2012   Underimmunization status 12/26/2011   Backache 08/21/2011   Hearing loss 05/23/2011   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) 03/22/2010   Kidney stone 05/25/2009   ED (erectile dysfunction) of organic origin 05/25/2009   Obesity 05/25/2009   Metabolic syndrome 05/25/2009   Polyneuropathy  12/25/2008   Hyperglycemia due to type 2 diabetes mellitus (HCC) 12/25/2008   Gastro-esophageal reflux disease without esophagitis 12/25/2008   Insomnia 12/25/2008   Dilatation of aorta (HCC) 10/02/2008   Essential hypertension 10/02/2008   Hyperlipidemia 10/02/2008   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) 11/10/2019   Coronary artery disease    cardiologist-  dr Donnie Aho--  s/p  cardiac cath's w/ angioplasty and stenting x2   DDD (degenerative disc disease), lumbar    Diabetic peripheral neuropathy associated with type 2 diabetes mellitus (HCC) 12/12/2017   Dyslipidemia 01/25/2018   Dyspnea on exertion 08/22/2019   Foraminal stenosis of lumbar region 12/12/2017   History of kidney stones    Hyperlipidemia    Hypertension    Lumbar radiculopathy, right    right leg weakness   Myofascial pain syndrome 12/12/2017   Nephrolithiasis    bilateral non-obstructive per ct 01-19-2016   Peripheral neuropathy    feet   Posterior vitreous detachment of both eyes 11/10/2019   Pseudophakia of both eyes 11/10/2019   Right leg weakness    due to lumbar radiculopathy   Right ureteral stone    S/P coronary artery stent placement    2002 x1  and 2003 x1   Spondylosis without myelopathy or radiculopathy, lumbar region 12/12/2017   Trigger finger 05/28/2018   Type 2 diabetes mellitus (HCC)    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) 11/10/2019   Wears hearing aid    BILATERAL    Family History  Problem Relation Age of Onset   Gallbladder disease Father    Cancer Brother     Past Surgical History:  Procedure Laterality Date   BIOPSY  07/11/2022   Procedure: BIOPSY;  Surgeon: Kerin Salen, MD;  Location: [facility] ENDOSCOPY;   Service: Gastroenterology;;   CARDIAC CATHETERIZATION  01/ 2002   dr Donnie Aho   mLAD 30%,  CFX OM3 40%,  RCA 30% previous stent site   CARDIOVASCULAR STRESS TEST  04-15-2009  dr Donnie Aho   normal nuclear study w/ no ischemia/  normal LV function and wall motion , ef 77%   CATARACT EXTRACTION W/ INTRAOCULAR LENS  IMPLANT, BILATERAL  2013  approx.   COLONOSCOPY WITH PROPOFOL  2014   CORONARY ANGIOPLASTY  1990  dr Donnie Aho   PTCA to OM2   CORONARY ANGIOPLASTY  10/ 1996  dr Donnie Aho   PTCA to RCA   CORONARY ANGIOPLASTY WITH STENT PLACEMENT  04/ 1996  dr Donnie Aho   stenting to RCA   CYSTOSCOPY WITH URETEROSCOPY AND STENT PLACEMENT Right 03/28/2016   Procedure: CYSTOSCOPY WITH RIGHT  URETEROSCOPY AND STENT PLACEMENT;  Surgeon: Jerilee Field, MD;  Location: [facility];  Service: Urology;  Laterality: Right;   ESOPHAGOGASTRODUODENOSCOPY (EGD) WITH PROPOFOL N/A 07/11/2022   Procedure: ESOPHAGOGASTRODUODENOSCOPY (EGD) WITH PROPOFOL;  Surgeon: Kerin Salen, MD;  Location: [facility] ENDOSCOPY;  Service: Gastroenterology;  Laterality: N/A;   EXTRACORPOREAL SHOCK WAVE LITHOTRIPSY  yrs ago   HOLMIUM LASER APPLICATION Right 03/28/2016   Procedure: HOLMIUM LASER APPLICATION;  Surgeon: Jerilee Field, MD;  Location: [facility];  Service: Urology;  Laterality: Right;   INTRAMEDULLARY (IM) NAIL INTERTROCHANTERIC Left 07/07/2022   Procedure: INTRAMEDULLARY (IM) NAILING OF LEFT FEMUR;  Surgeon: Tarry Kos, MD;  Location: [facility] OR;  Service: Orthopedics;  Laterality: Left;   LAPAROSCOPIC CHOLECYSTECTOMY  1990's   SUBMUCOSAL INJECTION  07/11/2022   Procedure: SUBMUCOSAL INJECTION;  Surgeon: Kerin Salen, MD;  Location: [facility] ENDOSCOPY;  Service: Gastroenterology;;   TONSILLECTOMY AND ADENOIDECTOMY  child   URETEROLITHOTOMY  1990's   Social History   Occupational History   Not on file  Tobacco Use   Smoking status: Former    Current packs/day: 0.00    Average packs/day: 1 pack/day for 40.0 years  (40.0 ttl pk-yrs)    Types: Cigarettes    Start date: 08/26/1948    Quit date: 08/26/1988    Years since quitting: 34.1   Smokeless tobacco: Never  Vaping Use   Vaping status: Never Used  Substance and Sexual Activity   Alcohol use: Yes    Comment: rare   Drug use: No   Sexual activity: Not on file

## 2022-10-03 NOTE — Addendum Note (Signed)
Addended by: Cristie Hem on: 10/03/2022 11:12 AM   Modules accepted: Orders

## 2022-10-13 ENCOUNTER — Encounter: Payer: Self-pay | Admitting: Cardiology

## 2022-10-13 ENCOUNTER — Ambulatory Visit: Payer: Medicare Other | Admitting: Cardiology

## 2022-10-13 VITALS — BP 114/69 | HR 67 | Resp 16 | Ht 68.0 in | Wt 181.0 lb

## 2022-10-13 DIAGNOSIS — I10 Essential (primary) hypertension: Secondary | ICD-10-CM

## 2022-10-13 DIAGNOSIS — I25708 Atherosclerosis of coronary artery bypass graft(s), unspecified, with other forms of angina pectoris: Secondary | ICD-10-CM

## 2022-10-13 NOTE — Progress Notes (Signed)
Patient referred by Creola Corn, MD for CAD  Subjective:   Alejandro Foster, male    DOB: December 09, 1933, 89 y.o.   MRN: [id number]   Chief Complaint  Patient presents with   Coronary artery disease of bypass graft of native heart wit   Follow-up     HPI  89 y.o. Caucasian male with patient hypertension, hyperlipidemia type 2 diabetes mellitus, coronary artery disease s/p PCI with Palmar Schatz stents in 1996, mild PAD  Since his last visit with me, he had an accidental fall leading to hip fracture which required surgery. He is currently undergoing physical therapy for the same. He denies chest pain, shortness of breath, palpitations, leg edema, orthopnea, PND, TIA/syncope. He has occasional sharp back pain for 1 second when he gets up from bed. Blood pressure elevated on first check, but subsequently improved.    Initial consultation visit 04/2022: Patient is quite active for his age, enjoys his annual camping trips to the [place] with his family.  Following symptoms described by him were also noted in prior office visits with his previous cardiologist.  Notably, he reports retrosternal burning sensation on walking at high elevation.  He has no symptoms while walking 2 to 3 miles on relatively flat land.  He does report burning sensation in his entire legs, which resolved instantaneously after resting.  While he has noted to have biphasic waveform on his previous vascular ultrasound, iliac vessels were noted to be widely patent.   Reviewed recent test results with the patient, details below.     Current Outpatient Medications:    acetaminophen (TYLENOL) 325 MG tablet, Take 2 tablets (650 mg total) by mouth every 4 (four) hours as needed for mild pain or moderate pain. Gel caps, Disp: , Rfl:    amLODipine (NORVASC) 5 MG tablet, Take 5 mg by mouth every evening. , Disp: , Rfl:    aspirin EC 81 MG tablet, Take 81 mg by mouth daily., Disp: , Rfl:    atorvastatin (LIPITOR) 20 MG tablet, Take 20  mg by mouth every evening. , Disp: , Rfl:    cetirizine (ZYRTEC) 10 MG tablet, Take 10 mg by mouth daily as needed for allergies., Disp: , Rfl:    Cholecalciferol (VITAMIN D PO), Take 5 drops by mouth every evening. Unknown strength, Disp: , Rfl:    DULoxetine (CYMBALTA) 30 MG capsule, Take 30 mg by mouth daily., Disp: , Rfl:    enoxaparin (LOVENOX) 40 MG/0.4ML injection, Inject 0.4 mLs (40 mg total) into the skin daily for 14 days., Disp: 5.6 mL, Rfl: 0   fenofibrate (TRICOR) 48 MG tablet, Take 48 mg by mouth every evening. , Disp: , Rfl:    gabapentin (NEURONTIN) 100 MG capsule, Take 100-200 mg by mouth 2 (two) times daily. Take 100 mg by mouth  at noon and then take  200 mg by mouth at night per patient, Disp: , Rfl:    glipiZIDE-metformin (METAGLIP) 2.5-500 MG per tablet, Take 2 tablets by mouth 2 (two) times daily before a meal. , Disp: , Rfl:    HUMALOG MIX 75/25 KWIKPEN (75-25) 100 UNIT/ML Kwikpen, Inject 22-40 Units into the skin See admin instructions. Take 40 units SQ in the morning and then take 22 units SQ in the evening., Disp: , Rfl: 6   hydrochlorothiazide (HYDRODIURIL) 25 MG tablet, Take 25 mg by mouth every morning. , Disp: , Rfl:    Magnesium Citrate 125 MG CAPS, Take 150 mg by mouth daily., Disp: ,  Rfl:    metoprolol tartrate (LOPRESSOR) 25 MG tablet, Take 25 mg by mouth every morning. , Disp: , Rfl:    oxyCODONE (OXY IR/ROXICODONE) 5 MG immediate release tablet, Take 1-2 tablets (5-10 mg total) by mouth every 8 (eight) hours as needed for severe pain., Disp: 30 tablet, Rfl: 0   pantoprazole (PROTONIX) 40 MG tablet, Take 1 tablet (40 mg total) by mouth 2 (two) times daily before a meal., Disp: 60 tablet, Rfl: 2   ramipril (ALTACE) 10 MG capsule, Take 20 mg by mouth daily., Disp: , Rfl:    solifenacin (VESICARE) 5 MG tablet, Take 5 mg by mouth every morning. , Disp: , Rfl:    traMADol (ULTRAM) 50 MG tablet, Take 1 tablet (50 mg total) by mouth 2 (two) times daily as needed., Disp:  30 tablet, Rfl: 2   zolpidem (AMBIEN) 10 MG tablet, Take 1 tablet (10 mg total) by mouth at bedtime., Disp: 10 tablet, Rfl: 0   Cardiovascular and other pertinent studies:  Reviewed external labs and tests, independently interpreted  EKG 05/10/2022: Sinus rhythm 59 bpm First degree A-V block   Regadenoson (with Mod Bruce protocol) Nuclear stress test 06/19/2022: Myocardial perfusion is normal. Diaphragmatic attenuation noted in the inferior wall. Overall LV systolic function is normal without regional wall motion abnormalities. Stress LV EF: 56%. Low risk study. Nondiagnostic ECG stress. The heart rate response was consistent with Regadenoson. The blood pressure response was physiologic. No previous exam available for comparison.    Echocardiogram 05/10/2022:  Mildly depressed LV systolic function with visual EF 40-45%. Left  ventricle cavity is normal in size. Eccentric hypertrophy of the left  ventricle. Hypokinetic global wall motion. Doppler evidence of grade I  (impaired) diastolic dysfunction. Calculated EF 42%.  Structurally normal tricuspid valve with trace regurgitation. No evidence  of pulmonary hypertension.  No prior available for comparison.   Recent labs: 07/18/2022: Glucose 125, BUN/Cr 17/0.81. EGFR >60. Na/K 137/3.1. Mg 1.5. H/H 10.9/32.7. MCV 85. Platelets 443 HbA1C 7.2%    Review of Systems  Cardiovascular:  Negative for chest pain, dyspnea on exertion, leg swelling, palpitations and syncope.         Vitals:   10/13/22 1015  BP: (!) 147/65  Pulse: 70  Resp: 16  SpO2: 93%     Body mass index is 27.52 kg/m. Filed Weights   10/13/22 1015  Weight: 181 lb (82.1 kg)      Objective:   Physical Exam Vitals and nursing note reviewed.  Constitutional:      General: He is not in acute distress. Neck:     Vascular: No JVD.  Cardiovascular:     Rate and Rhythm: Normal rate and regular rhythm.     Heart sounds: Normal heart sounds. No murmur  heard. Pulmonary:     Effort: Pulmonary effort is normal.     Breath sounds: Normal breath sounds. No wheezing or rales.  Musculoskeletal:     Right lower leg: No edema.     Left lower leg: No edema.          Visit diagnoses:   ICD-10-CM   1. Coronary artery disease 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)  I25.708     2. Essential hypertension  I10          Assessment & Recommendations:    89 y.o. Caucasian male with patient hypertension, hyperlipidemia type 2 diabetes mellitus, coronary artery disease s/p PCI with Palmar Schatz stents in 1996,  suspected mild PAD  CAD: No angina symptoms No ischemia on stress testing (06/2022). Mildly reduced EF without signs of heart failure (04/2022). Continue Aspirin, statin, current hypertensive therapy.  He will get labs with PCP next month, where K and Mg can be rechecked (low in 06/2022).  F/u in 6 months    Elder Negus, MD Pager: [phone] Office: [phone]

## 2022-11-06 ENCOUNTER — Encounter (INDEPENDENT_AMBULATORY_CARE_PROVIDER_SITE_OTHER): Payer: Medicare Other | Admitting: Ophthalmology

## 2022-12-05 ENCOUNTER — Other Ambulatory Visit (INDEPENDENT_AMBULATORY_CARE_PROVIDER_SITE_OTHER): Payer: Medicare Other

## 2022-12-05 ENCOUNTER — Ambulatory Visit: Payer: Medicare Other | Admitting: Orthopaedic Surgery

## 2022-12-05 DIAGNOSIS — S72142A Displaced intertrochanteric fracture of left femur, initial encounter for closed fracture: Secondary | ICD-10-CM | POA: Diagnosis not present

## 2022-12-05 NOTE — Progress Notes (Signed)
Office Visit Note   Patient: Alejandro Foster           Date of Birth: Mar 12, 1934           MRN: [id number] Visit Date: 12/05/2022              Requested by: Creola Corn, MD [address] [place],  [place] [address] PCP: Creola Corn, MD   Assessment & Plan: Visit Diagnoses:  1. Displaced intertrochanteric fracture of left femur, initial encounter for closed fracture [facility])     Plan: Audric is now 5 months status post left intertrochanteric hip fracture.  He is doing well and has minimal pain.  He is mainly walking without a cane.  At this point he can advance activity as tolerated.  Follow-up as needed.  Follow-Up Instructions: No follow-ups on file.   Orders:  Orders Placed This Encounter  Procedures   XR FEMUR MIN 2 VIEWS LEFT   No orders of the defined types were placed in this encounter.     Procedures: No procedures performed   Clinical Data: No additional findings.   Subjective: Chief Complaint  Patient presents with   Left Hip - Follow-up    HPI Mr. Schweiss is here for 34-month postop check.  He is doing well overall.  He is walking better.  He is mainly walking without a cane.  Reports no significant pain or discomfort. Review of Systems   Objective: Vital Signs: There were no vitals taken for this visit.  Physical Exam  Ortho Exam Examination of the left hip shows fully healed surgical scars.  He has painless range of motion of the hip. Specialty Comments:  No specialty comments available.  Imaging: No results found.   PMFS History: Patient Active Problem List   Diagnosis Date Noted   Fall 07/06/2022   Displaced intertrochanteric fracture of left femur, initial encounter for closed fracture (HCC) 07/06/2022   CAD (coronary artery disease) 07/06/2022   Coronary artery disease 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) 05/12/2022   Paving stone retinal degeneration, bilateral 11/03/2021   Senile purpura (HCC) 09/07/2020    Claudication (HCC) 03/17/2020   Wears hearing aid    Type 2 diabetes mellitus (HCC)    S/P coronary artery stent placement    Right ureteral stone    Right leg weakness    Lumbar radiculopathy, right    History of kidney stones    DDD (degenerative disc disease), lumbar    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) 11/10/2019   Pseudophakia of both eyes 11/10/2019   Posterior vitreous detachment of both eyes 11/10/2019   Amnesia 09/19/2019   Dyspnea on exertion 08/22/2019   Atypical chest pain 08/22/2019   Diabetic retinopathy associated with type 2 diabetes mellitus (HCC) 03/27/2019   Trigger finger 05/28/2018   Dyslipidemia 01/25/2018   Diabetic peripheral neuropathy associated with type 2 diabetes mellitus (HCC) 12/12/2017   Chronic bilateral thoracic back pain 12/12/2017   Myofascial pain syndrome 12/12/2017   Foraminal stenosis of lumbar region 12/12/2017   Spondylosis without myelopathy or radiculopathy, lumbar region 12/12/2017   Cough 09/17/2017   Irritability and anger 09/17/2017   Localized edema 08/01/2017   Abnormal gait 01/09/2017   Fatigue 09/12/2016   Hypertensive heart disease without congestive heart failure 09/03/2014   Long term (current) use of insulin (HCC) 09/03/2014   Ventricular premature depolarization 09/03/2014   Encounter for general adult medical  examination without abnormal findings 08/26/2014   Gout 08/28/2013   Impacted cerumen 08/28/2013   Hypo-osmolality and hyponatremia 08/28/2013   Urge incontinence of urine 08/26/2012   Age-related osteoporosis without current pathological fracture 02/19/2012   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   Vitamin D deficiency 02/19/2012   Underimmunization status 12/26/2011   Backache 08/21/2011   Hearing loss 05/23/2011   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) 03/22/2010   Kidney stone 05/25/2009   ED (erectile  dysfunction) of organic origin 05/25/2009   Obesity 05/25/2009   Metabolic syndrome 05/25/2009   Polyneuropathy 12/25/2008   Hyperglycemia due to type 2 diabetes mellitus (HCC) 12/25/2008   Gastro-esophageal reflux disease without esophagitis 12/25/2008   Insomnia 12/25/2008   Dilatation of aorta (HCC) 10/02/2008   Essential hypertension 10/02/2008   Hyperlipidemia 10/02/2008   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) 11/10/2019   Coronary artery disease    cardiologist-  dr Donnie Aho--  s/p  cardiac cath's w/ angioplasty and stenting x2   DDD (degenerative disc disease), lumbar    Diabetic peripheral neuropathy associated with type 2 diabetes mellitus (HCC) 12/12/2017   Dyslipidemia 01/25/2018   Dyspnea on exertion 08/22/2019   Foraminal stenosis of lumbar region 12/12/2017   History of kidney stones    Hyperlipidemia    Hypertension    Lumbar radiculopathy, right    right leg weakness   Myofascial pain syndrome 12/12/2017   Nephrolithiasis    bilateral non-obstructive per ct 01-19-2016   Peripheral neuropathy    feet   Posterior vitreous detachment of both eyes 11/10/2019   Pseudophakia of both eyes 11/10/2019   Right leg weakness    due to lumbar radiculopathy   Right ureteral stone    S/P coronary artery stent placement    2002 x1  and 2003 x1   Spondylosis without myelopathy or radiculopathy, lumbar region 12/12/2017   Trigger finger 05/28/2018   Type 2 diabetes mellitus (HCC)    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) 11/10/2019   Wears hearing aid    BILATERAL    Family History  Problem Relation Age of Onset   Gallbladder disease Father    Cancer Brother     Past Surgical History:  Procedure  Laterality Date   BIOPSY  07/11/2022   Procedure: BIOPSY;  Surgeon: Kerin Salen, MD;  Location: [facility] ENDOSCOPY;  Service: Gastroenterology;;   CARDIAC CATHETERIZATION  01/ 2002   dr Donnie Aho   mLAD 30%,  CFX OM3 40%,  RCA 30% previous stent site   CARDIOVASCULAR STRESS TEST  04-15-2009  dr Donnie Aho   normal nuclear study w/ no ischemia/  normal LV function and wall motion , ef 77%   CATARACT EXTRACTION W/ INTRAOCULAR LENS  IMPLANT, BILATERAL  2013  approx.   COLONOSCOPY WITH PROPOFOL  2014   CORONARY ANGIOPLASTY  1990  dr Donnie Aho   PTCA to OM2   CORONARY ANGIOPLASTY  10/ 1996  dr Donnie Aho   PTCA to RCA   CORONARY ANGIOPLASTY WITH STENT PLACEMENT  04/ 1996  dr Donnie Aho   stenting to RCA   CYSTOSCOPY WITH URETEROSCOPY AND STENT PLACEMENT Right 03/28/2016   Procedure: CYSTOSCOPY WITH RIGHT  URETEROSCOPY AND STENT PLACEMENT;  Surgeon: Jerilee Field,  MD;  Location: [facility];  Service: Urology;  Laterality: Right;   ESOPHAGOGASTRODUODENOSCOPY (EGD) WITH PROPOFOL N/A 07/11/2022   Procedure: ESOPHAGOGASTRODUODENOSCOPY (EGD) WITH PROPOFOL;  Surgeon: Kerin Salen, MD;  Location: [facility] ENDOSCOPY;  Service: Gastroenterology;  Laterality: N/A;   EXTRACORPOREAL SHOCK WAVE LITHOTRIPSY  yrs ago   HOLMIUM LASER APPLICATION Right 03/28/2016   Procedure: HOLMIUM LASER APPLICATION;  Surgeon: Jerilee Field, MD;  Location: [facility];  Service: Urology;  Laterality: Right;   INTRAMEDULLARY (IM) NAIL INTERTROCHANTERIC Left 07/07/2022   Procedure: INTRAMEDULLARY (IM) NAILING OF LEFT FEMUR;  Surgeon: Tarry Kos, MD;  Location: [facility] OR;  Service: Orthopedics;  Laterality: Left;   LAPAROSCOPIC CHOLECYSTECTOMY  1990's   SUBMUCOSAL INJECTION  07/11/2022   Procedure: SUBMUCOSAL INJECTION;  Surgeon: Kerin Salen, MD;  Location: [facility] ENDOSCOPY;  Service: Gastroenterology;;   TONSILLECTOMY AND ADENOIDECTOMY  child   URETEROLITHOTOMY  1990's   Social History   Occupational History   Not on file   Tobacco Use   Smoking status: Former    Current packs/day: 0.00    Average packs/day: 1 pack/day for 40.0 years (40.0 ttl pk-yrs)    Types: Cigarettes    Start date: 08/26/1948    Quit date: 08/26/1988    Years since quitting: 34.2   Smokeless tobacco: Never  Vaping Use   Vaping status: Never Used  Substance and Sexual Activity   Alcohol use: Yes    Comment: rare   Drug use: No   Sexual activity: Not on file

## 2023-02-09 ENCOUNTER — Other Ambulatory Visit: Payer: Self-pay | Admitting: Gastroenterology

## 2023-03-09 NOTE — Progress Notes (Signed)
PCP -  Cardiologist - LOV 10-13-22 Truett Mainland, MD  epic  PPM/ICD -  Device Orders -  Rep Notified -   Chest x-ray -  EKG - 07-19-22 epic Stress Test - 06-20-22 epic ECHO - 04-20-22 epic Cardiac Cath -   Sleep Study -  CPAP -   Fasting Blood Sugar -  Checks Blood Sugar _____ times a day  Blood Thinner Instructions: Aspirin Instructions:81 mg   COVID vaccine -  Activity-- Anesthesia review: DM2, CAD stent, HTN  Patient denies shortness of breath, fever, cough and chest pain at PAT appointment   All instructions explained to the patient, with a verbal understanding of the material. Patient agrees to go over the instructions while at home for a better understanding. Patient also instructed to self quarantine after being tested for COVID-19. The opportunity to ask questions was provided.

## 2023-03-12 ENCOUNTER — Encounter (HOSPITAL_COMMUNITY): Payer: Self-pay | Admitting: Gastroenterology

## 2023-03-12 ENCOUNTER — Other Ambulatory Visit: Payer: Self-pay

## 2023-03-19 NOTE — H&P (Signed)
History of Present Illness  This is a 89 year old patient here today to discuss symptoms of GERD. Patient was prescribed PPI BID for 90 days following EGD.  Today patient reports that he is going "just fine". He does report that his acid reflux improved since his pcp changed his diabetes meds. Patient reports that he started having foods getting hung again about a week ago. He reports that this happens every time he eats. He continues to take protonix 40mg  once daily.   GI history: Colon: 04/11/2011, WNL EGD w/botox (Dr. Marca Ancona): 07/11/22, d/t dysphagia (abnormal barium swallow), moderate chronic inactive gastritis, LA grade D esophagitis, 2cm hiatal hernia, h.pylori negative, EOE negative.  Alarm Symptoms: -Anemia: Denies -Weight Loss: Denies -Change in appetite: Denies -Change in bowel habits: Denies -Trouble swallowing: Yes, dysphagia with solid foods -Blood in stool: Denies -Black Stools: Denies.  Vital Signs Wt: 185.6, Wt change: -1.4 lbs, Ht: 68, BMI: 28.22, Temp: 97.5, Pulse sitting: 51, BP sitting: 129/69. Physical Examination    GENERAL:  Patient appears  pleasant, well-developed.     ABDOMEN:  Abdomen  soft, nontender, nondistended.     PSYCHOLOGY:  Affect:  alert and oriented, mood and affect appear normal.   Assessments 1. Dysphagia - R13.10 (Primary)    2. GERD (gastroesophageal reflux disease) - K21.9    Treatment 1. Dysphagia      IMAGING: EGD (Ordered for 01/22/2023) Notes: Francene Castle 01/22/2023 10:46:15 AM > pt needs endo w/botox at hospital. Pt added to Dr Marca Ancona hospital list.     Notes: Patient with dysphagia with all solid foods. Discussed patient with Dr. Marca Ancona in office today. She feels that patient needs repeat EGD with botox at this time. No alarm symptoms reported today. Discussed risk of EGD including perforation, bleeding, infection. All questions regarding procedure answered today. Order placed for EGD with botox with Dr. Marca Ancona at [facility].    2. GERD (gastroesophageal reflux disease)        Notes: Symptoms greatly improved since PCP changed diabetes medication and since starting protonix. Continue protonix 40 mg daily. Discussed lifestyle modifications to decrease likelihood of reflux symptoms.

## 2023-03-20 ENCOUNTER — Ambulatory Visit (HOSPITAL_COMMUNITY)
Admission: RE | Admit: 2023-03-20 | Discharge: 2023-03-20 | Disposition: A | Discharge status: Home / Self Care | Payer: Medicare Other | Attending: Gastroenterology | Admitting: Gastroenterology

## 2023-03-20 ENCOUNTER — Ambulatory Visit (HOSPITAL_COMMUNITY): Payer: Medicare Other | Admitting: Certified Registered"

## 2023-03-20 ENCOUNTER — Other Ambulatory Visit: Payer: Self-pay

## 2023-03-20 ENCOUNTER — Encounter (HOSPITAL_COMMUNITY)
Admission: RE | Disposition: A | Discharge status: Home / Self Care | Payer: Self-pay | Source: Home / Self Care | Attending: Gastroenterology

## 2023-03-20 ENCOUNTER — Encounter (HOSPITAL_COMMUNITY): Payer: Self-pay | Admitting: Gastroenterology

## 2023-03-20 DIAGNOSIS — K31819 Angiodysplasia of stomach and duodenum without bleeding: Secondary | ICD-10-CM

## 2023-03-20 DIAGNOSIS — E119 Type 2 diabetes mellitus without complications: Secondary | ICD-10-CM | POA: Insufficient documentation

## 2023-03-20 DIAGNOSIS — Z7984 Long term (current) use of oral hypoglycemic drugs: Secondary | ICD-10-CM | POA: Diagnosis not present

## 2023-03-20 DIAGNOSIS — Z87891 Personal history of nicotine dependence: Secondary | ICD-10-CM | POA: Insufficient documentation

## 2023-03-20 DIAGNOSIS — E1151 Type 2 diabetes mellitus with diabetic peripheral angiopathy without gangrene: Secondary | ICD-10-CM | POA: Diagnosis not present

## 2023-03-20 DIAGNOSIS — I10 Essential (primary) hypertension: Secondary | ICD-10-CM | POA: Diagnosis not present

## 2023-03-20 DIAGNOSIS — R1310 Dysphagia, unspecified: Secondary | ICD-10-CM

## 2023-03-20 DIAGNOSIS — M1990 Unspecified osteoarthritis, unspecified site: Secondary | ICD-10-CM | POA: Diagnosis not present

## 2023-03-20 DIAGNOSIS — I2510 Atherosclerotic heart disease of native coronary artery without angina pectoris: Secondary | ICD-10-CM

## 2023-03-20 DIAGNOSIS — K219 Gastro-esophageal reflux disease without esophagitis: Secondary | ICD-10-CM | POA: Insufficient documentation

## 2023-03-20 DIAGNOSIS — Z79899 Other long term (current) drug therapy: Secondary | ICD-10-CM | POA: Insufficient documentation

## 2023-03-20 HISTORY — PX: BOTOX INJECTION: SHX5754

## 2023-03-20 HISTORY — PX: ESOPHAGOGASTRODUODENOSCOPY (EGD) WITH PROPOFOL: SHX5813

## 2023-03-20 LAB — GLUCOSE, CAPILLARY
Glucose-Capillary: 99 mg/dL (ref 70–99)
Glucose-Capillary: 100 mg/dL — ABNORMAL HIGH (ref 70–99)

## 2023-03-20 SURGERY — ESOPHAGOGASTRODUODENOSCOPY (EGD) WITH PROPOFOL
Anesthesia: Monitor Anesthesia Care

## 2023-03-20 MED ORDER — PROPOFOL 10 MG/ML IV BOLUS
INTRAVENOUS | Status: AC
  Filled 2023-03-20: qty 20

## 2023-03-20 MED ORDER — SODIUM CHLORIDE (PF) 0.9 % IJ SOLN
INTRAMUSCULAR | Status: DC | PRN
  Administered 2023-03-20: 4 mL via SUBMUCOSAL

## 2023-03-20 MED ORDER — PROPOFOL 500 MG/50ML IV EMUL
INTRAVENOUS | Status: AC
  Filled 2023-03-20: qty 50

## 2023-03-20 MED ORDER — LIDOCAINE 2% (20 MG/ML) 5 ML SYRINGE
INTRAMUSCULAR | Status: DC | PRN
  Administered 2023-03-20: 60 mg via INTRAVENOUS

## 2023-03-20 MED ORDER — SODIUM CHLORIDE 0.9 % IV SOLN: INTRAVENOUS | Status: DC

## 2023-03-20 MED ORDER — PROPOFOL 10 MG/ML IV BOLUS
INTRAVENOUS | Status: DC | PRN
  Administered 2023-03-20: 80 mg via INTRAVENOUS

## 2023-03-20 MED ORDER — PROPOFOL 500 MG/50ML IV EMUL
INTRAVENOUS | Status: DC | PRN
  Administered 2023-03-20: 125 ug/kg/min via INTRAVENOUS

## 2023-03-20 MED ORDER — SODIUM CHLORIDE (PF) 0.9 % IJ SOLN
INTRAMUSCULAR | Status: AC
Start: 2023-03-20 — End: ?
  Filled 2023-03-20: qty 10

## 2023-03-20 MED ORDER — ONABOTULINUMTOXINA 100 UNITS IJ SOLR
INTRAMUSCULAR | Status: AC
  Filled 2023-03-20: qty 100

## 2023-03-20 SURGICAL SUPPLY — 14 items

## 2023-03-20 NOTE — Anesthesia Preprocedure Evaluation (Addendum)
 Anesthesia Evaluation  Patient identified by MRN, date of birth, ID band Patient awake    Reviewed: Allergy & Precautions, NPO status , Patient's Chart, lab work & pertinent test results  Airway Mallampati: I  TM Distance: >3 FB Neck ROM: Full    Dental  (+) Teeth Intact, Dental Advisory Given   Pulmonary former smoker   breath sounds clear to auscultation       Cardiovascular hypertension, Pt. on medications and Pt. on home beta blockers + CAD, + Cardiac Stents and + Peripheral Vascular Disease   Rhythm:Regular Rate:Normal  Echo:  Echocardiogram 05/10/2022: Mildly depressed LV systolic function with visual EF 40-45%. Left ventricle cavity is normal in size. Eccentric hypertrophy of the left ventricle. Hypokinetic global wall motion. Doppler evidence of grade I (impaired) diastolic dysfunction. Calculated EF 42%. Structurally normal tricuspid valve with trace regurgitation. No evidence of pulmonary hypertension.     Neuro/Psych  Neuromuscular disease  negative psych ROS   GI/Hepatic Neg liver ROS,GERD  Medicated,,  Endo/Other  diabetes, Type 2, Oral Hypoglycemic Agents    Renal/GU Renal disease     Musculoskeletal  (+) Arthritis ,    Abdominal   Peds  Hematology negative hematology ROS (+)   Anesthesia Other Findings   Reproductive/Obstetrics                             Anesthesia Physical Anesthesia Plan  ASA: 3  Anesthesia Plan: MAC   Post-op Pain Management: Minimal or no pain anticipated   Induction: Intravenous  PONV Risk Score and Plan: 0 and Propofol  infusion  Airway Management Planned: Natural Airway and Nasal Cannula  Additional Equipment: None  Intra-op Plan:   Post-operative Plan:   Informed Consent: I have reviewed the patients History and Physical, chart, labs and discussed the procedure including the risks, benefits and alternatives for the proposed  anesthesia with the patient or authorized representative who has indicated his/her understanding and acceptance.       Plan Discussed with: CRNA  Anesthesia Plan Comments:        Anesthesia Quick Evaluation

## 2023-03-20 NOTE — Transfer of Care (Signed)
 Immediate Anesthesia Transfer of Care Note  Patient: Alejandro Foster  Procedure(s) Performed: ESOPHAGOGASTRODUODENOSCOPY (EGD) WITH PROPOFOL  BOTOX  INJECTION  Patient Location: Endoscopy Unit  Anesthesia Type:MAC  Level of Consciousness: drowsy and patient cooperative  Airway & Oxygen Therapy: Patient Spontanous Breathing  Post-op Assessment: Report given to RN and Post -op Vital signs reviewed and stable  Post vital signs: Reviewed and stable  Last Vitals:  Vitals Value Taken Time  BP 128/60 03/20/23 1033  Temp 36.3 C 03/20/23 1033  Pulse 58 03/20/23 1038  Resp 17 03/20/23 1038  SpO2 94 % 03/20/23 1038  Vitals shown include unfiled device data.  Last Pain:  Vitals:   03/20/23 1033  TempSrc: Temporal  PainSc: 0-No pain         Complications: No notable events documented.

## 2023-03-20 NOTE — Op Note (Signed)
 [facility] Patient Name: Alejandro Foster Procedure Date: 03/20/2023 MRN: [id number] Attending MD: Estelita Manas , MD, [id number] Date of Birth: 1933-04-11 CSN: [id number] Age: 89 Admit Type: Outpatient Procedure:                Upper GI endoscopy Indications:              Dysphagia, for BOTOX  injection Providers:                Estelita Manas, MD, Particia Fischer, RN, Hoy Penner,                            RN, Lorrayne Kitty, Technician Referring MD:             Norleen Jungling, MD Medicines:                Monitored Anesthesia Care Complications:            No immediate complications. Estimated Blood Loss:     Estimated blood loss: none. Procedure:                Pre-Anesthesia Assessment:                           - Prior to the procedure, a History and Physical                            was performed, and patient medications and                            allergies were reviewed. The patient's tolerance of                            previous anesthesia was also reviewed. The risks                            and benefits of the procedure and the sedation                            options and risks were discussed with the patient.                            All questions were answered, and informed consent                            was obtained. Prior Anticoagulants: The patient has                            taken no anticoagulant or antiplatelet agents. ASA                            Grade Assessment: III - A patient with severe                            systemic disease. After reviewing the risks and  benefits, the patient was deemed in satisfactory                            condition to undergo the procedure.                           After obtaining informed consent, the endoscope was                            passed under direct vision. Throughout the                            procedure, the patient's blood pressure, pulse, and                             oxygen saturations were monitored continuously. The                            [id number]-[id number] ([id number]) Olympus endoscope was introduced                            through the mouth, and advanced to the second part                            of duodenum. The upper GI endoscopy was                            accomplished without difficulty. The patient                            tolerated the procedure well. Scope In: Scope Out: Findings:      No endoscopic abnormality was evident in the esophagus to explain the       patient's complaint of dysphagia.      Area was successfully injected with 100 units botulinum toxin, 25       units/ml, 1 ml each in a four quadrant fashion, 1 cm above GE junction.      The entire examined stomach was normal.      The cardia and gastric fundus were normal on retroflexion.      The examined duodenum was normal.      A single non bleeding 3 mm angioectasia with no bleeding was found in       the gastric body. Impression:               - No endoscopic esophageal abnormality to explain                            patient's dysphagia. Injected with botulinum toxin.                           - Normal stomach.                           - Normal examined duodenum.                           -  A single non-bleeding angioectasia in the stomach.                           - No specimens collected. Moderate Sedation:      Patient did not receive moderate sedation for this procedure, but       instead received monitored anesthesia care. Recommendation:           - Advance diet as tolerated.                           - Continue present medications. Procedure Code(s):        --- Professional ---                           [phone], Esophagogastroduodenoscopy, flexible,                            transoral; with directed submucosal injection(s),                            any substance Diagnosis Code(s):        --- Professional ---                           R13.10,  Dysphagia, unspecified                           K31.819, Angiodysplasia of stomach and duodenum                            without bleeding CPT copyright 2022 [employer]. All rights reserved. The codes documented in this report are preliminary and upon coder review may  be revised to meet current compliance requirements. Estelita Manas, MD 03/20/2023 10:31:49 AM This report has been signed electronically. Number of Addenda: 0

## 2023-03-20 NOTE — Discharge Instructions (Signed)
 YOU HAD AN ENDOSCOPIC PROCEDURE TODAY: Refer to the procedure report and other information in the discharge instructions given to you for any specific questions about what was found during the examination. If this information does not answer your questions, please call [facility] office at [phone] to clarify.   YOU SHOULD EXPECT: Some feelings of bloating in the abdomen. Passage of more gas than usual. Walking can help get rid of the air that was put into your GI tract during the procedure and reduce the bloating. If you had a lower endoscopy (such as a colonoscopy or flexible sigmoidoscopy) you may notice spotting of blood in your stool or on the toilet paper. Some abdominal soreness may be present for a day or two, also.  DIET: Your first meal following the procedure should be a light meal and then it is ok to progress to your normal diet. A half-sandwich or bowl of soup is an example of a good first meal. Heavy or fried foods are harder to digest and may make you feel nauseous or bloated. Drink plenty of fluids but you should avoid alcoholic beverages for 24 hours. If you had a esophageal dilation, please see attached instructions for diet.    ACTIVITY: Your care partner should take you home directly after the procedure. You should plan to take it easy, moving slowly for the rest of the day. You can resume normal activity the day after the procedure however YOU SHOULD NOT DRIVE, use power tools, machinery or perform tasks that involve climbing or major physical exertion for 24 hours (because of the sedation medicines used during the test).   SYMPTOMS TO REPORT IMMEDIATELY: A gastroenterologist can be reached at any hour. Please call [phone]  for any of the following symptoms:  Following lower endoscopy (colonoscopy, flexible sigmoidoscopy) Excessive amounts of blood in the stool  Significant tenderness, worsening of abdominal pains  Swelling of the abdomen that is new, acute  Fever of 100  or higher  Following upper endoscopy (EGD, EUS, ERCP, esophageal dilation) Vomiting of blood or coffee ground material  New, significant abdominal pain  New, significant chest pain or pain under the shoulder blades  Painful or persistently difficult swallowing  New shortness of breath  Black, tarry-looking or red, bloody stools  FOLLOW UP:  If any biopsies were taken you will be contacted by phone or by letter within the next 1-3 weeks. Call [phone]  if you have not heard about the biopsies in 3 weeks.  Please also call with any specific questions about appointments or follow up tests. YOU HAD AN ENDOSCOPIC PROCEDURE TODAY: Refer to the procedure report and other information in the discharge instructions given to you for any specific questions about what was found during the examination. If this information does not answer your questions, please call [facility] office at [phone] to clarify.   YOU SHOULD EXPECT: Some feelings of bloating in the abdomen. Passage of more gas than usual. Walking can help get rid of the air that was put into your GI tract during the procedure and reduce the bloating. If you had a lower endoscopy (such as a colonoscopy or flexible sigmoidoscopy) you may notice spotting of blood in your stool or on the toilet paper. Some abdominal soreness may be present for a day or two, also.  DIET: Your first meal following the procedure should be a light meal and then it is ok to progress to your normal diet. A half-sandwich or bowl of soup is an  example of a good first meal. Heavy or fried foods are harder to digest and may make you feel nauseous or bloated. Drink plenty of fluids but you should avoid alcoholic beverages for 24 hours. If you had a esophageal dilation, please see attached instructions for diet.    ACTIVITY: Your care partner should take you home directly after the procedure. You should plan to take it easy, moving slowly for the rest of the day. You can resume  normal activity the day after the procedure however YOU SHOULD NOT DRIVE, use power tools, machinery or perform tasks that involve climbing or major physical exertion for 24 hours (because of the sedation medicines used during the test).   SYMPTOMS TO REPORT IMMEDIATELY: A gastroenterologist can be reached at any hour. Please call [phone]  for any of the following symptoms:  Following lower endoscopy (colonoscopy, flexible sigmoidoscopy) Excessive amounts of blood in the stool  Significant tenderness, worsening of abdominal pains  Swelling of the abdomen that is new, acute  Fever of 100 or higher  Following upper endoscopy (EGD, EUS, ERCP, esophageal dilation) Vomiting of blood or coffee ground material  New, significant abdominal pain  New, significant chest pain or pain under the shoulder blades  Painful or persistently difficult swallowing  New shortness of breath  Black, tarry-looking or red, bloody stools  FOLLOW UP:  If any biopsies were taken you will be contacted by phone or by letter within the next 1-3 weeks. Call [phone]  if you have not heard about the biopsies in 3 weeks.  Please also call with any specific questions about appointments or follow up tests.

## 2023-03-20 NOTE — Interval H&P Note (Signed)
 History and Physical Interval Note: 89 year old male with dysphagia and abnormal barium swallow for EGD with Botox  injection with propofol .  03/20/2023 9:38 AM  Alejandro Foster  has presented today for EGD with Botox  injection with propofol , with the diagnosis of dysphagia.  The various methods of treatment have been discussed with the patient and family. After consideration of risks, benefits and other options for treatment, the patient has consented to  Procedure(s) with comments: ESOPHAGOGASTRODUODENOSCOPY (EGD) WITH PROPOFOL  (N/A) - with botox  injection s BOTOX  INJECTION (N/A) as a surgical intervention.  The patient's history has been reviewed, patient examined, no change in status, stable for surgery.  I have reviewed the patient's chart and labs.  Questions were answered to the patient's satisfaction.     Estelita Manas

## 2023-03-20 NOTE — Anesthesia Postprocedure Evaluation (Signed)
 Anesthesia Post Note  Patient: Alejandro Foster  Procedure(s) Performed: ESOPHAGOGASTRODUODENOSCOPY (EGD) WITH PROPOFOL  BOTOX  INJECTION     Patient location during evaluation: PACU Anesthesia Type: MAC Level of consciousness: awake and alert Pain management: pain level controlled Vital Signs Assessment: post-procedure vital signs reviewed and stable Respiratory status: spontaneous breathing, nonlabored ventilation, respiratory function stable and patient connected to nasal cannula oxygen Cardiovascular status: stable and blood pressure returned to baseline Postop Assessment: no apparent nausea or vomiting Anesthetic complications: no  No notable events documented.  Last Vitals:  Vitals:   03/20/23 1040 03/20/23 1050  BP: 139/62 (!) 149/70  Pulse: (!) 59 62  Resp: 18 16  Temp:    SpO2: 94% 94%    Last Pain:  Vitals:   03/20/23 1050  TempSrc:   PainSc: 0-No pain                 [name]

## 2023-03-22 ENCOUNTER — Encounter (HOSPITAL_COMMUNITY): Payer: Self-pay | Admitting: Gastroenterology

## 2023-04-13 ENCOUNTER — Ambulatory Visit: Payer: Medicare Other | Attending: Cardiology | Admitting: Cardiology

## 2023-04-13 ENCOUNTER — Encounter: Payer: Self-pay | Admitting: Cardiology

## 2023-04-13 VITALS — BP 124/60 | HR 57 | Resp 16 | Ht 67.0 in | Wt 187.0 lb

## 2023-04-13 DIAGNOSIS — I10 Essential (primary) hypertension: Secondary | ICD-10-CM

## 2023-04-13 DIAGNOSIS — E1149 Type 2 diabetes mellitus with other diabetic neurological complication: Secondary | ICD-10-CM

## 2023-04-13 DIAGNOSIS — I2510 Atherosclerotic heart disease of native coronary artery without angina pectoris: Secondary | ICD-10-CM | POA: Diagnosis not present

## 2023-04-13 MED ORDER — ASPIRIN 81 MG PO TBEC: 81.0000 mg | DELAYED_RELEASE_TABLET | Freq: Every day | ORAL | 2 refills | Status: DC

## 2023-04-13 MED ORDER — ATORVASTATIN CALCIUM 40 MG PO TABS: 40.0000 mg | ORAL_TABLET | Freq: Every day | ORAL | 2 refills | Status: AC

## 2023-04-13 NOTE — Patient Instructions (Signed)
Medication Instructions:   PLEASE START BACK TAKING ASPIRIN 81 MG BY MOUTH DAILY  STOP TAKING METOPROLOL TARTRATE NOW  STOP TAKING IBUPROFEN  YOU MAY TAKE TYLENOL AS NEEDED FOR PAIN--PLEASE TAKE AS DIRECTED ON BOTTLE  INCREASE YOUR ATORVASTATIN (LIPITOR) TO 40 MG BY MOUTH DAILY   *If you need a refill on your cardiac medications before your next appointment, please call your pharmacy*   Lab Work:  PLEASE HAVE YOUR PCP CHECK LIPIDS ON YOU IN THE NEXT 3 MONTHS  If you have labs (blood work) drawn today and your tests are completely normal, you will receive your results only by: MyChart Message (if you have MyChart) OR A paper copy in the mail If you have any lab test that is abnormal or we need to change your treatment, we will call you to review the results.      Follow-Up: At [facility], you and your health needs are our priority.  As part of our continuing mission to provide you with exceptional heart care, we have created designated Provider Care Teams.  These Care Teams include your primary Cardiologist (physician) and Advanced Practice Providers (APPs -  Physician Assistants and Nurse Practitioners) who all work together to provide you with the care you need, when you need it.  We recommend signing up for the patient portal called "MyChart".  Sign up information is provided on this After Visit Summary.  MyChart is used to connect with patients for Virtual Visits (Telemedicine).  Patients are able to view lab/test results, encounter notes, upcoming appointments, etc.  Non-urgent messages can be sent to your provider as well.   To learn more about what you can do with MyChart, go to [email or web address].    Your next appointment:   6 month(s)  Provider:   DR. Rosemary Holms

## 2023-04-13 NOTE — Progress Notes (Signed)
Cardiology Office Note:  .   Date:  04/13/2023  ID:  [name], DOB 1934/01/26, MRN [id number] PCP: Creola Corn, MD  [facility] Providers Cardiologist:  Truett Mainland, MD PCP: Creola Corn, MD  Chief Complaint  Patient presents with   Coronary Artery Disease   Follow-up    6 month      History of Present Illness: .    Alejandro Foster is a 89 y.o. male with hypertension, hyperlipidemia type 2 diabetes mellitus, coronary artery disease s/p PCI with Palmar Schatz stents in 1996, mild PAD, dysphagia, mild anemia  Patient is here today with his wife.  He was admitted in 06/2022 with severe esophagitis.  At that time, he was recommended to avoid NSAIDs.  There was no clear correlation about discontinuation of aspirin.  However, it appears that since then, he has not been taking aspirin.  He has been using Tylenol as well as ibuprofen for hip pain since his hip surgery.  He follows up with gastroenterologist Dr. Marca Ancona for what appears to be dysphagia for which he was given Botox injections.  There was no clear mention of bleeding.  He denies any hematemesis or melena or hematochezia.  However, he remains mildly anemic with stable hemoglobin of around 10.9.  He denies any chest pain, shortness of breath symptoms, although admits that he has not been as physically active since his hip surgery.   A1c is elevated 8.1%.  This is managed by his PCP Dr. Timothy Lasso.  He is currently taking insulin along with the glipizide, glipizide-metformin was discontinued due to diarrhea.  He does not know what dose of glipizide he currently takes.  Vitals:   04/13/23 0937  BP: 124/60  Pulse: (!) 57  Resp: 16  SpO2: 93%     ROS:  Review of Systems  Cardiovascular:  Negative for chest pain, dyspnea on exertion, leg swelling, palpitations and syncope.  Musculoskeletal:  Positive for joint pain.     Studies Reviewed: Marland Kitchen        Independently interpreted 8-02/2023: Chol 167, TG 169, HDL 51,  LDL 82 HbA1C 8.1% Hb 10.9  06/2022: HbA1C 7.2% Hb 10.9 Cr 0.81 K 3.1, Mg 1.5    Physical Exam:   Physical Exam Vitals and nursing note reviewed.  Constitutional:      General: He is not in acute distress. Neck:     Vascular: No JVD.  Cardiovascular:     Rate and Rhythm: Normal rate and regular rhythm.     Heart sounds: Normal heart sounds. No murmur heard. Pulmonary:     Effort: Pulmonary effort is normal.     Breath sounds: Normal breath sounds. No wheezing or rales.  Musculoskeletal:     Right lower leg: No edema.     Left lower leg: No edema.      VISIT DIAGNOSES:   ICD-10-CM   1. Coronary artery disease involving native coronary artery of native heart without angina pectoris  I25.10     2. Essential hypertension  I10     3. 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    Alejandro Foster is a 89 y.o. male with hypertension, hyperlipidemia type 2 diabetes mellitus, coronary artery disease s/p PCI with Palmar Schatz stents in 1996, mild PAD, dysphagia, mild anemia   CAD: No angina symptoms No ischemia on stress testing (06/2022). Mildly reduced EF without signs of  heart failure (04/2022). He is currently not taking aspirin, does not have any clear contraindication.  Has stable anemia without any bleeding. Recommend resuming aspirin 81 mg daily.  Avoid ibuprofen, use Tylenol for hip pain. LDL 82.  Increase atorvastatin from 20 mg daily to 40 mg daily.  He will have follow-up lipid panel with Dr. Timothy Lasso in the near future.  Goal LDL <70. In absence of angina, I have discontinued his metoprolol tartrate, which she was taking 25 mg once a day only.  Type 2 diabetes mellitus: Uncontrolled.  A1c 8.1%. Defer management to Dr. Timothy Lasso.  I have encouraged the patient to contact Dr. Timothy Lasso for follow-up of his diabetes.  He will get labs with PCP next month, where K and Mg can be rechecked (low in  06/2022).     Meds ordered this encounter  Medications   aspirin EC 81 MG tablet    Sig: Take 1 tablet (81 mg total) by mouth daily. Swallow whole.    Dispense:  90 tablet    Refill:  2   atorvastatin (LIPITOR) 40 MG tablet    Sig: Take 1 tablet (40 mg total) by mouth daily.    Dispense:  90 tablet    Refill:  2    DOSE INCREASE     F/u in 6 months  Signed, Elder Negus, MD

## 2023-08-29 ENCOUNTER — Other Ambulatory Visit: Payer: Self-pay

## 2023-08-29 ENCOUNTER — Ambulatory Visit: Attending: Orthopaedic Surgery | Admitting: Physical Therapy

## 2023-08-29 ENCOUNTER — Encounter: Payer: Self-pay | Admitting: Physical Therapy

## 2023-08-29 ENCOUNTER — Encounter: Payer: Self-pay | Admitting: Orthopaedic Surgery

## 2023-08-29 ENCOUNTER — Ambulatory Visit: Admitting: Orthopaedic Surgery

## 2023-08-29 ENCOUNTER — Ambulatory Visit (INDEPENDENT_AMBULATORY_CARE_PROVIDER_SITE_OTHER)

## 2023-08-29 DIAGNOSIS — S72142A Displaced intertrochanteric fracture of left femur, initial encounter for closed fracture: Secondary | ICD-10-CM | POA: Diagnosis not present

## 2023-08-29 DIAGNOSIS — M25552 Pain in left hip: Secondary | ICD-10-CM | POA: Insufficient documentation

## 2023-08-29 DIAGNOSIS — R2689 Other abnormalities of gait and mobility: Secondary | ICD-10-CM | POA: Insufficient documentation

## 2023-08-29 DIAGNOSIS — M6281 Muscle weakness (generalized): Secondary | ICD-10-CM | POA: Diagnosis present

## 2023-08-29 MED ORDER — PREDNISONE 10 MG (21) PO TBPK
ORAL_TABLET | ORAL | 3 refills | Status: AC
Start: 2023-08-29 — End: ?

## 2023-08-29 NOTE — Progress Notes (Signed)
 Office Visit Note   Patient: Alejandro Foster           Date of Birth: [birth year]           MRN: [id number] Visit Date: 08/29/2023              Requested by: Margarete Sharps, MD [address] [place],  [place] [address] PCP: Margarete Sharps, MD   Assessment & Plan: Visit Diagnoses:  1. Displaced intertrochanteric fracture of left femur, initial encounter for closed fracture [facility])     Plan: History of Present Illness Alejandro Foster is an 89 year old male who presents with left hip pain.  He experiences lateral hip pain at the site of previous stitches, described as 'knotty'. The pain has worsened since Christmas, affecting his ability to walk and causing a limp. There is no groin pain. Weight-bearing activities exacerbate the pain, limiting his walking to less than half a mile without discomfort. The pain is rated as one out of ten when the hip is moved side to side. Tenderness is present on the lateral side of the hip, with slight pain on side-to-side rotation but no pain on up-and-down movement.  States he has recently gained weight.  Physical Exam MUSCULOSKELETAL: Left leg slightly shorter than right. Tenderness on lateral side of left hip. No pain on passive rotation of left hip. No pain on flexion and extension of left hip. Mild pain on lateral rotation of left hip.  Assessment and Plan Trochanteric left hip pain Chronic mild lateral hip pain likely due to bursitis, exacerbated by weight-bearing and weight gain. X-rays show healed fracture with slight leg shortening and in varus. Weight gain increases hip muscle strain. - Prescribe 6-day oral steroids to reduce inflammation. - Refer to physical therapy for rehabilitation. - Advise ambulation within pain limits.  Weight gain Recent weight gain post-hospitalization increases strain on hip muscles, exacerbating hip pain. - Advise on weight loss to reduce hip muscle strain.  Follow-Up Instructions: No follow-ups on file.   Orders:  Orders  Placed This Encounter  Procedures   XR HIP UNILAT W OR W/O PELVIS 2-3 VIEWS LEFT   Ambulatory referral to Physical Therapy   Meds ordered this encounter  Medications   predniSONE (STERAPRED UNI-PAK 21 TAB) 10 MG (21) TBPK tablet    Sig: Take as directed    Dispense:  21 tablet    Refill:  3     Subjective: Chief Complaint  Patient presents with   Left Hip - Pain    HPI  Review of Systems  Constitutional: Negative.   HENT: Negative.    Eyes: Negative.   Respiratory: Negative.    Cardiovascular: Negative.   Gastrointestinal: Negative.   Endocrine: Negative.   Genitourinary: Negative.   Skin: Negative.   Allergic/Immunologic: Negative.   Neurological: Negative.   Hematological: Negative.   Psychiatric/Behavioral: Negative.    All other systems reviewed and are negative.    Objective: Vital Signs: There were no vitals taken for this visit.  Physical Exam Vitals and nursing note reviewed.  Constitutional:      Appearance: He is well-developed.  HENT:     Head: Normocephalic and atraumatic.  Eyes:     Pupils: Pupils are equal, round, and reactive to light.  Pulmonary:     Effort: Pulmonary effort is normal.  Abdominal:     Palpations: Abdomen is soft.  Musculoskeletal:        General: Normal range of motion.  Cervical back: Neck supple.  Skin:    General: Skin is warm.  Neurological:     Mental Status: He is alert and oriented to person, place, and time.  Psychiatric:        Behavior: Behavior normal.        Thought Content: Thought content normal.        Judgment: Judgment normal.     Ortho Exam  Specialty Comments:  No specialty comments available.  Imaging: XR HIP UNILAT W OR W/O PELVIS 2-3 VIEWS LEFT Result Date: 08/29/2023 X-rays of the pelvis and left hip show a healed intertrochanteric femur fracture and a slight amount of varus.  There is no hardware complications.    PMFS History: Patient Active Problem List   Diagnosis Date  Noted   Fall 07/06/2022   Displaced intertrochanteric fracture of left femur, initial encounter for closed fracture (HCC) 07/06/2022   CAD (coronary artery disease) 07/06/2022   Coronary artery disease 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) 05/12/2022   Paving stone retinal degeneration, bilateral 11/03/2021   Senile purpura (HCC) 09/07/2020   Claudication (HCC) 03/17/2020   Wears hearing aid    Type 2 diabetes mellitus (HCC)    S/P coronary artery stent placement    Right ureteral stone    Right leg weakness    Lumbar radiculopathy, right    History of kidney stones    DDD (degenerative disc disease), lumbar    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) 11/10/2019   Pseudophakia of both eyes 11/10/2019   Posterior vitreous detachment of both eyes 11/10/2019   Amnesia 09/19/2019   Dyspnea on exertion 08/22/2019   Atypical chest pain 08/22/2019   Diabetic retinopathy associated with type 2 diabetes mellitus (HCC) 03/27/2019   Trigger finger 05/28/2018   Dyslipidemia 01/25/2018   Diabetic peripheral neuropathy associated with type 2 diabetes mellitus (HCC) 12/12/2017   Chronic bilateral thoracic back pain 12/12/2017   Myofascial pain syndrome 12/12/2017   Foraminal stenosis of lumbar region 12/12/2017   Spondylosis without myelopathy or radiculopathy, lumbar region 12/12/2017   Cough 09/17/2017   Irritability and anger 09/17/2017   Localized edema 08/01/2017   Abnormal gait 01/09/2017   Fatigue 09/12/2016   Hypertensive heart disease without congestive heart failure 09/03/2014   Long term (current) use of insulin  (HCC) 09/03/2014   Ventricular premature depolarization 09/03/2014   Encounter for general adult medical examination without abnormal findings 08/26/2014   Gout 08/28/2013   Impacted cerumen 08/28/2013   Hypo-osmolality and hyponatremia 08/28/2013   Urge incontinence of urine  08/26/2012   Age-related osteoporosis without current pathological fracture 02/19/2012   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   Vitamin D deficiency 02/19/2012   Underimmunization status 12/26/2011   Backache 08/21/2011   Hearing loss 05/23/2011   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) 03/22/2010   Kidney stone 05/25/2009   ED (erectile dysfunction) of organic origin 05/25/2009   Obesity 05/25/2009   Metabolic syndrome 05/25/2009   Polyneuropathy 12/25/2008   Hyperglycemia due to type 2 diabetes mellitus (HCC) 12/25/2008   Gastro-esophageal reflux disease without esophagitis 12/25/2008   Insomnia 12/25/2008   Dilatation of aorta (HCC) 10/02/2008   Essential hypertension 10/02/2008   Hyperlipidemia 10/02/2008   Past Medical History:  Diagnosis Date   Chronic bilateral thoracic back pain 12/12/2017   Complication of anesthesia    Hard time waking up in the past   Controlled type  2 diabetes mellitus with mild nonproliferative retinopathy of right eye (HCC) 11/10/2019   Coronary artery disease    cardiologist-  dr Anastasia Balo--  s/p  cardiac cath's w/ angioplasty and stenting x2   DDD (degenerative disc disease), lumbar    Diabetic peripheral neuropathy associated with type 2 diabetes mellitus (HCC) 12/12/2017   Dyslipidemia 01/25/2018   Dyspnea on exertion 08/22/2019   Foraminal stenosis of lumbar region 12/12/2017   History of kidney stones    Hyperlipidemia    Hypertension    Lumbar radiculopathy, right    right leg weakness   Myofascial pain syndrome 12/12/2017   Nephrolithiasis    bilateral non-obstructive per ct 01-19-2016   Peripheral neuropathy    feet   Posterior vitreous detachment of both eyes 11/10/2019   Pseudophakia of both eyes 11/10/2019   Right leg weakness    due to lumbar radiculopathy   Right ureteral stone    S/P coronary artery stent placement    2002 x1  and 2003 x1   Spondylosis without myelopathy or radiculopathy,  lumbar region 12/12/2017   Trigger finger 05/28/2018   Type 2 diabetes mellitus (HCC)    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) 11/10/2019   Wears hearing aid    BILATERAL    Family History  Problem Relation Age of Onset   Gallbladder disease Father    Cancer Brother     Past Surgical History:  Procedure Laterality Date   BIOPSY  07/11/2022   Procedure: BIOPSY;  Surgeon: Genell Ken, MD;  Location: [facility] ENDOSCOPY;  Service: Gastroenterology;;   BOTOX  INJECTION N/A 03/20/2023   Procedure: BOTOX  INJECTION;  Surgeon: Genell Ken, MD;  Location: [facility] ENDOSCOPY;  Service: Gastroenterology;  Laterality: N/A;   CARDIAC CATHETERIZATION  01/ 2002   dr Anastasia Balo   mLAD 30%,  CFX OM3 40%,  RCA 30% previous stent site   CARDIOVASCULAR STRESS TEST  04-15-2009  dr Anastasia Balo   normal nuclear study w/ no ischemia/  normal LV function and wall motion , ef 77%   CATARACT EXTRACTION W/ INTRAOCULAR LENS  IMPLANT, BILATERAL  2013  approx.   COLONOSCOPY WITH PROPOFOL   2014   CORONARY ANGIOPLASTY  1990  dr Anastasia Balo   PTCA to OM2   CORONARY ANGIOPLASTY  10/ 1996  dr Anastasia Balo   PTCA to RCA   CORONARY ANGIOPLASTY WITH STENT PLACEMENT  04/ 1996  dr Anastasia Balo   stenting to RCA   CYSTOSCOPY WITH URETEROSCOPY AND STENT PLACEMENT Right 03/28/2016   Procedure: CYSTOSCOPY WITH RIGHT  URETEROSCOPY AND STENT PLACEMENT;  Surgeon: Christina Coyer, MD;  Location: [facility];  Service: Urology;  Laterality: Right;   ESOPHAGOGASTRODUODENOSCOPY (EGD) WITH PROPOFOL  N/A 07/11/2022   Procedure: ESOPHAGOGASTRODUODENOSCOPY (EGD) WITH PROPOFOL ;  Surgeon: Genell Ken, MD;  Location: [facility] ENDOSCOPY;  Service: Gastroenterology;  Laterality: N/A;   ESOPHAGOGASTRODUODENOSCOPY (EGD) WITH PROPOFOL  N/A 03/20/2023   Procedure: ESOPHAGOGASTRODUODENOSCOPY (EGD) WITH PROPOFOL ;  Surgeon: Genell Ken, MD;  Location: [facility] ENDOSCOPY;  Service: Gastroenterology;  Laterality: N/A;  with  botox  injection s   EXTRACORPOREAL SHOCK WAVE LITHOTRIPSY  yrs ago   HOLMIUM LASER APPLICATION Right 03/28/2016   Procedure: HOLMIUM LASER APPLICATION;  Surgeon: Christina Coyer, MD;  Location: [facility];  Service: Urology;  Laterality: Right;   INTRAMEDULLARY (IM) NAIL INTERTROCHANTERIC Left 07/07/2022   Procedure: INTRAMEDULLARY (IM) NAILING OF LEFT FEMUR;  Surgeon: Wes Hamman, MD;  Location: [facility] OR;  Service: Orthopedics;  Laterality:  Left;   LAPAROSCOPIC CHOLECYSTECTOMY  1990's   SUBMUCOSAL INJECTION  07/11/2022   Procedure: SUBMUCOSAL INJECTION;  Surgeon: Genell Ken, MD;  Location: [facility] ENDOSCOPY;  Service: Gastroenterology;;   TONSILLECTOMY AND ADENOIDECTOMY  child   URETEROLITHOTOMY  1990's   Social History   Occupational History   Not on file  Tobacco Use   Smoking status: Former    Current packs/day: 0.00    Average packs/day: 1 pack/day for 40.0 years (40.0 ttl pk-yrs)    Types: Cigarettes    Start date: 08/26/1948    Quit date: 08/26/1988    Years since quitting: 35.0   Smokeless tobacco: Never  Vaping Use   Vaping status: Never Used  Substance and Sexual Activity   Alcohol use: Yes    Comment: rare   Drug use: No   Sexual activity: Not on file

## 2023-08-29 NOTE — Therapy (Signed)
 OUTPATIENT PHYSICAL THERAPY LOWER EXTREMITY EVALUATION   Patient Name: Alejandro Foster MRN: [id number] DOB:July 08, 1933, 89 y.o., male Today's Date: 08/29/2023  END OF SESSION:  PT End of Session - 08/29/23 1413     Visit Number 1    Date for PT Re-Evaluation 10/24/23    Authorization Type [employer] Medicare    Progress Note Due on Visit 10    PT Start Time 1234    PT Stop Time 1310    PT Time Calculation (min) 36 min    Activity Tolerance Patient tolerated treatment well    Behavior During Therapy WFL for tasks assessed/performed             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) 11/10/2019   Coronary artery disease    cardiologist-  dr Anastasia Balo--  s/p  cardiac cath's w/ angioplasty and stenting x2   DDD (degenerative disc disease), lumbar    Diabetic peripheral neuropathy associated with type 2 diabetes mellitus (HCC) 12/12/2017   Dyslipidemia 01/25/2018   Dyspnea on exertion 08/22/2019   Foraminal stenosis of lumbar region 12/12/2017   History of kidney stones    Hyperlipidemia    Hypertension    Lumbar radiculopathy, right    right leg weakness   Myofascial pain syndrome 12/12/2017   Nephrolithiasis    bilateral non-obstructive per ct 01-19-2016   Peripheral neuropathy    feet   Posterior vitreous detachment of both eyes 11/10/2019   Pseudophakia of both eyes 11/10/2019   Right leg weakness    due to lumbar radiculopathy   Right ureteral stone    S/P coronary artery stent placement    2002 x1  and 2003 x1   Spondylosis without myelopathy or radiculopathy, lumbar region 12/12/2017   Trigger finger 05/28/2018   Type 2 diabetes mellitus (HCC)    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) 11/10/2019    Wears hearing aid    BILATERAL   Past Surgical History:  Procedure Laterality Date   BIOPSY  07/11/2022   Procedure: BIOPSY;  Surgeon: Genell Ken, MD;  Location: [facility] ENDOSCOPY;  Service: Gastroenterology;;   BOTOX  INJECTION N/A 03/20/2023   Procedure: BOTOX  INJECTION;  Surgeon: Genell Ken, MD;  Location: [facility] ENDOSCOPY;  Service: Gastroenterology;  Laterality: N/A;   CARDIAC CATHETERIZATION  01/ 2002   dr Anastasia Balo   mLAD 30%,  CFX OM3 40%,  RCA 30% previous stent site   CARDIOVASCULAR STRESS TEST  04-15-2009  dr Anastasia Balo   normal nuclear study w/ no ischemia/  normal LV function and wall motion , ef 77%   CATARACT EXTRACTION W/ INTRAOCULAR LENS  IMPLANT, BILATERAL  2013  approx.   COLONOSCOPY WITH PROPOFOL   2014   CORONARY ANGIOPLASTY  1990  dr Anastasia Balo   PTCA to OM2   CORONARY ANGIOPLASTY  10/ 1996  dr Anastasia Balo   PTCA to RCA   CORONARY ANGIOPLASTY WITH STENT PLACEMENT  04/ 1996  dr Anastasia Balo   stenting to RCA   CYSTOSCOPY WITH URETEROSCOPY AND STENT PLACEMENT Right 03/28/2016   Procedure: CYSTOSCOPY WITH RIGHT  URETEROSCOPY AND STENT PLACEMENT;  Surgeon: Christina Coyer, MD;  Location: [facility];  Service: Urology;  Laterality: Right;   ESOPHAGOGASTRODUODENOSCOPY (EGD) WITH PROPOFOL  N/A 07/11/2022  Procedure: ESOPHAGOGASTRODUODENOSCOPY (EGD) WITH PROPOFOL ;  Surgeon: Genell Ken, MD;  Location[facility] ENDOSCOPY;  Service: Gastroenterology;  Laterality: N/A;   ESOPHAGOGASTRODUODENOSCOPY (EGD) WITH PROPOFOL  N/A 03/20/2023   Procedure: ESOPHAGOGASTRODUODENOSCOPY (EGD) WITH PROPOFOL ;  Surgeon: Genell Ken, MD;  Location: [facility] ENDOSCOPY;  Service: Gastroenterology;  Laterality: N/A;  with botox  injection s   EXTRACORPOREAL SHOCK WAVE LITHOTRIPSY  yrs ago   HOLMIUM LASER APPLICATION Right 03/28/2016   Procedure: HOLMIUM LASER APPLICATION;  Surgeon: Christina Coyer, MD;  Location: [facility];  Service: Urology;  Laterality: Right;   INTRAMEDULLARY (IM) NAIL INTERTROCHANTERIC Left  07/07/2022   Procedure: INTRAMEDULLARY (IM) NAILING OF LEFT FEMUR;  Surgeon: Wes Hamman, MD;  Location: [facility] OR;  Service: Orthopedics;  Laterality: Left;   LAPAROSCOPIC CHOLECYSTECTOMY  1990's   SUBMUCOSAL INJECTION  07/11/2022   Procedure: SUBMUCOSAL INJECTION;  Surgeon: Genell Ken, MD;  Location: [facility] ENDOSCOPY;  Service: Gastroenterology;;   TONSILLECTOMY AND ADENOIDECTOMY  child   URETEROLITHOTOMY  1990's   Patient Active Problem List   Diagnosis Date Noted   Fall 07/06/2022   Displaced intertrochanteric fracture of left femur, initial encounter for closed fracture (HCC) 07/06/2022   CAD (coronary artery disease) 07/06/2022   Coronary artery disease 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) 05/12/2022   Paving stone retinal degeneration, bilateral 11/03/2021   Senile purpura (HCC) 09/07/2020   Claudication (HCC) 03/17/2020   Wears hearing aid    Type 2 diabetes mellitus (HCC)    S/P coronary artery stent placement    Right ureteral stone    Right leg weakness    Lumbar radiculopathy, right    History of kidney stones    DDD (degenerative disc disease), lumbar    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) 11/10/2019   Pseudophakia of both eyes 11/10/2019   Posterior vitreous detachment of both eyes 11/10/2019   Amnesia 09/19/2019   Dyspnea on exertion 08/22/2019   Atypical chest pain 08/22/2019   Diabetic retinopathy associated with type 2 diabetes mellitus (HCC) 03/27/2019   Trigger finger 05/28/2018   Dyslipidemia 01/25/2018   Diabetic peripheral neuropathy associated with type 2 diabetes mellitus (HCC) 12/12/2017   Chronic bilateral thoracic back pain 12/12/2017   Myofascial pain syndrome 12/12/2017   Foraminal stenosis of lumbar region 12/12/2017   Spondylosis without myelopathy or radiculopathy, lumbar region 12/12/2017   Cough 09/17/2017   Irritability and anger 09/17/2017   Localized  edema 08/01/2017   Abnormal gait 01/09/2017   Fatigue 09/12/2016   Hypertensive heart disease without congestive heart failure 09/03/2014   Long term (current) use of insulin  (HCC) 09/03/2014   Ventricular premature depolarization 09/03/2014   Encounter for general adult medical examination without abnormal findings 08/26/2014   Gout 08/28/2013   Impacted cerumen 08/28/2013   Hypo-osmolality and hyponatremia 08/28/2013   Urge incontinence of urine 08/26/2012   Age-related osteoporosis without current pathological fracture 02/19/2012   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   Vitamin D deficiency 02/19/2012   Underimmunization status 12/26/2011   Backache 08/21/2011   Hearing loss 05/23/2011   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) 03/22/2010   Kidney stone 05/25/2009   ED (erectile dysfunction) of organic origin 05/25/2009   Obesity 05/25/2009   Metabolic syndrome 05/25/2009   Polyneuropathy 12/25/2008   Hyperglycemia due to type 2 diabetes mellitus (HCC) 12/25/2008   Gastro-esophageal reflux disease without esophagitis 12/25/2008   Insomnia 12/25/2008   Dilatation of aorta (  HCC) 10/02/2008   Essential hypertension 10/02/2008   Hyperlipidemia 10/02/2008    PCP: Margarete Sharps, MD   REFERRING PROVIDER: Wes Hamman, MD  REFERRING DIAG: [phone] (ICD-10-CM) - Displaced intertrochanteric fracture of left femur, initial encounter for closed fracture [facility])  THERAPY DIAG:  Pain in left hip - Plan: PT plan of care cert/re-cert  Muscle weakness (generalized) - Plan: PT plan of care cert/re-cert  Other abnormalities of gait and mobility - Plan: PT plan of care cert/re-cert  Rationale for Evaluation and Treatment: Rehabilitation  ONSET DATE: Jul 19 2022  SUBJECTIVE:   SUBJECTIVE STATEMENT: Patient presents with left hip pain that began when he broke his femur Jul 19 2022.  He was in the hospital and did PT at [facility]. He has been traveling and  he has gain some weight and he feels he is limping more. Patient reports he had imaging done today and every has healed fine there is just some inflammation. It has been challenging to putting on his shoes, bending, and he feels his balance is off. Previously he was able to walk a mile and now he is limited to 0.5 miles.   PERTINENT HISTORY: Left femur fx May 2024; Type 2 DB; lumbar DDD; peripheral neuropathy; HTN; PAIN:  Are you having pain? Yes: NPRS scale: 2(currently) 3(worst) / 10 Pain location: lateral femur; where his scares or located Pain description: constant soreness; achy Aggravating factors: bending, walking Relieving factors: Tylenol    PRECAUTIONS: None  RED FLAGS: None   WEIGHT BEARING RESTRICTIONS: No  FALLS:  Has patient fallen in last 6 months? Yes. Number of falls 1 he fell in the hospital walking with his walker  LIVING ENVIRONMENT: Lives with: lives with their spouse Lives in: House/apartment Stairs: Yes: Internal: 12 steps; on left going up Has following equipment at home: Walking cane  OCCUPATION: Retired Nutritional therapist  PLOF: Independent, Independent with basic ADLs, Independent with household mobility without device, Independent with community mobility without device, Independent with gait, and Independent with transfers  PATIENT GOALS: To be able to go camping again and be about to walk without a limp  NEXT MD VISIT: PRN  OBJECTIVE:  Note: Objective measures were completed at Evaluation unless otherwise noted.  DIAGNOSTIC FINDINGS: No final results yet  PATIENT SURVEYS:  LEFS: 38/80 47.5%  COGNITION: Overall cognitive status: Within functional limits for tasks assessed     SENSATION: WFL    MUSCLE LENGTH: Hamstrings: limited bilateral    POSTURE: rounded shoulders and forward head  PALPATION: Tenderness to palpation Lt TFL, glutes, and quads  LOWER EXTREMITY ROM: WFL; limited Lt hip IR/ER   LOWER EXTREMITY MMT:  MMT  Right eval Left eval  Hip flexion 4+ 4-*  Hip extension    Hip abduction 4+ 4-  Hip adduction    Hip internal rotation    Hip external rotation    Knee flexion 4+ 4+  Knee extension 4+ 4shaky  Ankle dorsiflexion    Ankle plantarflexion    Ankle inversion    Ankle eversion     (Blank rows = not tested)    FUNCTIONAL TESTS:  5 times sit to stand: 10.67 sec no UE support Timed up and go (TUG): 12.27 6 minute walk test: 892FT ; groin pain after 5 mins;  GAIT: Distance walked: 892 FT Assistive device utilized: None Level of assistance: Complete Independence Comments: decreased step length on Rt; decreased stance time on Lt ; antaglic gait  TREATMENT DATE:  08/29/2023 Initial Evaluation & HEP created  Benefits of ice  PATIENT EDUCATION:  Education details: Ice; POC; examination findings; HEP Person educated: Patient Education method: Explanation, Demonstration, and Handouts Education comprehension: verbalized understanding, returned demonstration, and needs further education  HOME EXERCISE PROGRAM: Access Code: [email or web address] URL: [email or web address] Date: 08/29/2023 Prepared by: Penelope Bowie  Exercises - Supine Gluteus Stretch  - 1 x daily - 7 x weekly - 2 sets - 20-30 hold - Supine Hamstring Stretch with Strap  - 1 x daily - 7 x weekly - 2 sets - 20-30 hold - Supine ITB Stretch with Strap  - 1 x daily - 7 x weekly - 2 sets - 20-30 hold - Seated Long Arc Quad  - 1 x daily - 7 x weekly - 2 sets - 10 reps - 2 hold  ASSESSMENT:  CLINICAL IMPRESSION: Patient is a 89 y.o. male who was seen today for physical therapy evaluation and treatment for left hip pain. Alejandro Foster presents to therapy with left hip pain after having a femur fracture May 2024. He had surgery and went to PT afterwards, he feels he was more sedentary after discharging from  therapy and pain started again. He is normally active and goes hiking when he camps. Based on evaluation noted gait abnormality, muscle weakness, and increased muscle spasms. Educated patient on the benefit of ice to decrease inflammation in his him. He normally walking 1 mile everyday and recently he has been limited to 0.5 miles due to hip pain. Patient is motivated and wants to get better. Patient will benefit from skilled PT to address the below impairments and improve overall function.   OBJECTIVE IMPAIRMENTS: Abnormal gait, decreased balance, difficulty walking, decreased ROM, decreased strength, increased muscle spasms, impaired flexibility, postural dysfunction, and pain.   ACTIVITY LIMITATIONS: carrying, bending, dressing, and locomotion level  PARTICIPATION LIMITATIONS: cleaning, laundry, community activity, school, and camping/ hiking  PERSONAL FACTORS: Age, Fitness, Time since onset of injury/illness/exacerbation, and 3+ comorbidities: Left femur fx May 2024; Type 2 DB;  peripheral neuropathy; HTN; are also affecting patient's functional outcome.   REHAB POTENTIAL: Good  CLINICAL DECISION MAKING: Stable/uncomplicated  EVALUATION COMPLEXITY: Moderate   GOALS: Goals reviewed with patient? Yes  SHORT TERM GOALS: Target date: 09/26/2023  Patient will be independent with initial HEP. Baseline:  Goal status: INITIAL  2.  Patient will be able to walk 0.5 miles without left him pain or discomfort. Baseline: increased pain and has to stop Goal status: INITIAL  3.  Patient will ambulate > or = to 1,000 feet during for improved community ambulation. Baseline: 841ft Goal status: INITIAL    LONG TERM GOALS: Target date: 10/24/2023  Patient will demonstrate independence in advanced HEP. Baseline:  Goal status: INITIAL  2.  Patient will be able to walk a mile with no left hip discomfort for improved cardiovascular endurance. Baseline: unable to walk 1 mile Goal status:  INITIAL  3.  Patient will verbalize and demonstrate self-care strategies to manage pain including tissue mobility practices and change of position. Baseline:  Goal status: INITIAL  4.  Patient will be able to bend and put on his socks and shoes with no left hip discomfort. Baseline: 2/10 pain Goal status: INITIAL  5.  Patient will perform TUG in < or = to 9 sec to decrease risk of falls. Baseline: 12.27sec Goal status: INITIAL  6.  Patient will score 48/80 on LEFS due to improved function of Lt LE. Baseline: 38/80  Goal  status: INITIAL   PLAN:  PT FREQUENCY: 2x/week  PT DURATION: 8 weeks  PLANNED INTERVENTIONS: 97164- PT Re-evaluation, 97110-Therapeutic exercises, 97530- Therapeutic activity, 97112- Neuromuscular re-education, 97535- Self Care, 16109- Manual therapy, [phone]- Gait training, [phone]- Canalith repositioning, [id number]- Aquatic Therapy, [phone]- Electrical stimulation (unattended), [phone]- Electrical stimulation (manual), [id number]- Ionotophoresis 4mg /ml Dexamethasone , 20560 (1-2 muscles), 20561 (3+ muscles)- Dry Needling, Patient/Family education, Balance training, Stair training, Taping, Joint mobilization, Joint manipulation, Spinal manipulation, Spinal mobilization, Vestibular training, Cryotherapy, and Moist heat  PLAN FOR NEXT SESSION: Review HEP; NuStep; Hip abductor & flexor strengthening; hip flexibility; single leg balance training    Penelope Bowie, PT 08/29/23 2:15 PM [facility] [address], [address] [place], [place] [address] Phone # [phone] Fax [phone]

## 2023-09-06 ENCOUNTER — Telehealth: Payer: Self-pay

## 2023-09-06 NOTE — Telephone Encounter (Signed)
 Patient called stating that he stopped taking the prednisone  due to his blood sugar being high.  Wanted to know if that's okay?  CB# [phone].  Please advise.  Thank you.

## 2023-09-06 NOTE — Telephone Encounter (Signed)
 Called and let patient know

## 2023-09-06 NOTE — Telephone Encounter (Signed)
 yes

## 2023-09-18 ENCOUNTER — Ambulatory Visit

## 2023-09-20 ENCOUNTER — Encounter

## 2023-09-25 ENCOUNTER — Ambulatory Visit: Attending: Orthopaedic Surgery | Admitting: Physical Therapy

## 2023-09-25 ENCOUNTER — Encounter: Payer: Self-pay | Admitting: Physical Therapy

## 2023-09-25 DIAGNOSIS — R2689 Other abnormalities of gait and mobility: Secondary | ICD-10-CM | POA: Diagnosis present

## 2023-09-25 DIAGNOSIS — M25552 Pain in left hip: Secondary | ICD-10-CM | POA: Diagnosis present

## 2023-09-25 DIAGNOSIS — R252 Cramp and spasm: Secondary | ICD-10-CM | POA: Diagnosis present

## 2023-09-25 DIAGNOSIS — R262 Difficulty in walking, not elsewhere classified: Secondary | ICD-10-CM | POA: Insufficient documentation

## 2023-09-25 DIAGNOSIS — R293 Abnormal posture: Secondary | ICD-10-CM | POA: Diagnosis present

## 2023-09-25 DIAGNOSIS — M6281 Muscle weakness (generalized): Secondary | ICD-10-CM | POA: Diagnosis present

## 2023-09-25 NOTE — Therapy (Signed)
 OUTPATIENT PHYSICAL THERAPY LOWER EXTREMITY TREATMENT   Patient Name: Alejandro Foster MRN: [id number] DOB:26-May-1933, 89 y.o., male Today's Date: 09/25/2023  END OF SESSION:  PT End of Session - 09/25/23 1101     Visit Number 2    Date for PT Re-Evaluation 10/24/23    Authorization Type [employer] Medicare [employer] approved 16 visits 08/29/2023 - 10/24/2023 [id number]    Authorization Time Period 08/29/2023 - 10/24/2023    Authorization - Visit Number 2    Authorization - Number of Visits 16    Progress Note Due on Visit 10    PT Start Time 1020    PT Stop Time 1100    PT Time Calculation (min) 40 min    Activity Tolerance Patient tolerated treatment well    Behavior During Therapy WFL for tasks assessed/performed           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) 11/10/2019   Coronary artery disease    cardiologist-  dr blanca--  s/p  cardiac cath's w/ angioplasty and stenting x2   DDD (degenerative disc disease), lumbar    Diabetic peripheral neuropathy associated with type 2 diabetes mellitus (HCC) 12/12/2017   Dyslipidemia 01/25/2018   Dyspnea on exertion 08/22/2019   Foraminal stenosis of lumbar region 12/12/2017   History of kidney stones    Hyperlipidemia    Hypertension    Lumbar radiculopathy, right    right leg weakness   Myofascial pain syndrome 12/12/2017   Nephrolithiasis    bilateral non-obstructive per ct 01-19-2016   Peripheral neuropathy    feet   Posterior vitreous detachment of both eyes 11/10/2019   Pseudophakia of both eyes 11/10/2019   Right leg weakness    due to lumbar radiculopathy   Right ureteral stone    S/P coronary artery stent placement    2002 x1  and 2003 x1   Spondylosis without myelopathy or radiculopathy, lumbar region 12/12/2017    Trigger finger 05/28/2018   Type 2 diabetes mellitus (HCC)    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) 11/10/2019   Wears hearing aid    BILATERAL   Past Surgical History:  Procedure Laterality Date   BIOPSY  07/11/2022   Procedure: BIOPSY;  Surgeon: Saintclair Jasper, MD;  Location: [facility] ENDOSCOPY;  Service: Gastroenterology;;   BOTOX  INJECTION N/A 03/20/2023   Procedure: BOTOX  INJECTION;  Surgeon: Saintclair Jasper, MD;  Location: [facility] ENDOSCOPY;  Service: Gastroenterology;  Laterality: N/A;   CARDIAC CATHETERIZATION  01/ 2002   dr blanca   mLAD 30%,  CFX OM3 40%,  RCA 30% previous stent site   CARDIOVASCULAR STRESS TEST  04-15-2009  dr blanca   normal nuclear study w/ no ischemia/  normal LV function and wall motion , ef 77%   CATARACT EXTRACTION W/ INTRAOCULAR LENS  IMPLANT, BILATERAL  2013  approx.   COLONOSCOPY WITH PROPOFOL   2014   CORONARY ANGIOPLASTY  1990  dr blanca   PTCA to OM2   CORONARY ANGIOPLASTY  10/ 1996  dr blanca   PTCA to RCA   CORONARY ANGIOPLASTY WITH STENT PLACEMENT  04/ 1996  dr blanca   stenting to RCA   CYSTOSCOPY WITH URETEROSCOPY AND STENT PLACEMENT Right 03/28/2016   Procedure: CYSTOSCOPY  WITH RIGHT  URETEROSCOPY AND STENT PLACEMENT;  Surgeon: Donnice Brooks, MD;  Location: [facility];  Service: Urology;  Laterality: Right;   ESOPHAGOGASTRODUODENOSCOPY (EGD) WITH PROPOFOL  N/A 07/11/2022   Procedure: ESOPHAGOGASTRODUODENOSCOPY (EGD) WITH PROPOFOL ;  Surgeon: Saintclair Jasper, MD;  Location: [facility] ENDOSCOPY;  Service: Gastroenterology;  Laterality: N/A;   ESOPHAGOGASTRODUODENOSCOPY (EGD) WITH PROPOFOL  N/A 03/20/2023   Procedure: ESOPHAGOGASTRODUODENOSCOPY (EGD) WITH PROPOFOL ;  Surgeon: Saintclair Jasper, MD;  Location: [facility] ENDOSCOPY;  Service: Gastroenterology;  Laterality: N/A;  with botox  injection s   EXTRACORPOREAL SHOCK WAVE LITHOTRIPSY  yrs ago   HOLMIUM LASER APPLICATION Right 03/28/2016   Procedure: HOLMIUM  LASER APPLICATION;  Surgeon: Donnice Brooks, MD;  Location: [facility];  Service: Urology;  Laterality: Right;   INTRAMEDULLARY (IM) NAIL INTERTROCHANTERIC Left 07/07/2022   Procedure: INTRAMEDULLARY (IM) NAILING OF LEFT FEMUR;  Surgeon: Jerri Kay HERO, MD;  Location: [facility] OR;  Service: Orthopedics;  Laterality: Left;   LAPAROSCOPIC CHOLECYSTECTOMY  1990's   SUBMUCOSAL INJECTION  07/11/2022   Procedure: SUBMUCOSAL INJECTION;  Surgeon: Saintclair Jasper, MD;  Location: [facility] ENDOSCOPY;  Service: Gastroenterology;;   TONSILLECTOMY AND ADENOIDECTOMY  child   URETEROLITHOTOMY  1990's   Patient Active Problem List   Diagnosis Date Noted   Fall 07/06/2022   Displaced intertrochanteric fracture of left femur, initial encounter for closed fracture (HCC) 07/06/2022   CAD (coronary artery disease) 07/06/2022   Coronary artery disease 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) 05/12/2022   Paving stone retinal degeneration, bilateral 11/03/2021   Senile purpura (HCC) 09/07/2020   Claudication (HCC) 03/17/2020   Wears hearing aid    Type 2 diabetes mellitus (HCC)    S/P coronary artery stent placement    Right ureteral stone    Right leg weakness    Lumbar radiculopathy, right    History of kidney stones    DDD (degenerative disc disease), lumbar    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) 11/10/2019   Pseudophakia of both eyes 11/10/2019   Posterior vitreous detachment of both eyes 11/10/2019   Amnesia 09/19/2019   Dyspnea on exertion 08/22/2019   Atypical chest pain 08/22/2019   Diabetic retinopathy associated with type 2 diabetes mellitus (HCC) 03/27/2019   Trigger finger 05/28/2018   Dyslipidemia 01/25/2018   Diabetic peripheral neuropathy associated with type 2 diabetes mellitus (HCC) 12/12/2017   Chronic bilateral thoracic back pain 12/12/2017   Myofascial pain syndrome 12/12/2017   Foraminal  stenosis of lumbar region 12/12/2017   Spondylosis without myelopathy or radiculopathy, lumbar region 12/12/2017   Cough 09/17/2017   Irritability and anger 09/17/2017   Localized edema 08/01/2017   Abnormal gait 01/09/2017   Fatigue 09/12/2016   Hypertensive heart disease without congestive heart failure 09/03/2014   Long term (current) use of insulin  (HCC) 09/03/2014   Ventricular premature depolarization 09/03/2014   Encounter for general adult medical examination without abnormal findings 08/26/2014   Gout 08/28/2013   Impacted cerumen 08/28/2013   Hypo-osmolality and hyponatremia 08/28/2013   Urge incontinence of urine 08/26/2012   Age-related osteoporosis without current pathological fracture 02/19/2012   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   Vitamin D deficiency 02/19/2012   Underimmunization status 12/26/2011   Backache 08/21/2011   Hearing loss 05/23/2011   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) 03/22/2010   Kidney stone 05/25/2009   ED (erectile dysfunction) of organic origin 05/25/2009   Obesity 05/25/2009   Metabolic  syndrome 05/25/2009   Polyneuropathy 12/25/2008   Hyperglycemia due to type 2 diabetes mellitus (HCC) 12/25/2008   Gastro-esophageal reflux disease without esophagitis 12/25/2008   Insomnia 12/25/2008   Dilatation of aorta (HCC) 10/02/2008   Essential hypertension 10/02/2008   Hyperlipidemia 10/02/2008    PCP: Onita Rush, MD   REFERRING PROVIDER: Jerri Kay HERO, MD  REFERRING DIAG: [phone] (ICD-10-CM) - Displaced intertrochanteric fracture of left femur, initial encounter for closed fracture [facility])  THERAPY DIAG:  Pain in left hip  Muscle weakness (generalized)  Other abnormalities of gait and mobility  Rationale for Evaluation and Treatment: Rehabilitation  ONSET DATE: Jul 19 2022  SUBJECTIVE:   SUBJECTIVE STATEMENT: Patient reports he is doing good today. He has not been doing his exercises enough  but he has been walking. Pain is 3/10.  From Eval: Patient presents with left hip pain that began when he broke his femur Jul 19 2022.  He was in the hospital and did PT at [facility]. He has been traveling and he has gain some weight and he feels he is limping more. Patient reports he had imaging done today and every has healed fine there is just some inflammation. It has been challenging to putting on his shoes, bending, and he feels his balance is off. Previously he was able to walk a mile and now he is limited to 0.5 miles.   PERTINENT HISTORY: Left femur fx May 2024; Type 2 DB; lumbar DDD; peripheral neuropathy; HTN; PAIN:  Are you having pain? Yes: NPRS scale: 2(currently) 3(worst) / 10 Pain location: lateral femur; where his scares or located Pain description: constant soreness; achy Aggravating factors: bending, walking Relieving factors: Tylenol    PRECAUTIONS: None  RED FLAGS: None   WEIGHT BEARING RESTRICTIONS: No  FALLS:  Has patient fallen in last 6 months? Yes. Number of falls 1 he fell in the hospital walking with his walker  LIVING ENVIRONMENT: Lives with: lives with their spouse Lives in: House/apartment Stairs: Yes: Internal: 12 steps; on left going up Has following equipment at home: Walking cane  OCCUPATION: Retired Nutritional therapist  PLOF: Independent, Independent with basic ADLs, Independent with household mobility without device, Independent with community mobility without device, Independent with gait, and Independent with transfers  PATIENT GOALS: To be able to go camping again and be about to walk without a limp  NEXT MD VISIT: PRN  OBJECTIVE:  Note: Objective measures were completed at Evaluation unless otherwise noted.  DIAGNOSTIC FINDINGS: No final results yet  PATIENT SURVEYS:  LEFS: 38/80 47.5%  COGNITION: Overall cognitive status: Within functional limits for tasks assessed     SENSATION: WFL    MUSCLE LENGTH: Hamstrings: limited  bilateral    POSTURE: rounded shoulders and forward head  PALPATION: Tenderness to palpation Lt TFL, glutes, and quads  LOWER EXTREMITY ROM: WFL; limited Lt hip IR/ER   LOWER EXTREMITY MMT:  MMT Right eval Left eval  Hip flexion 4+ 4-*  Hip extension    Hip abduction 4+ 4-  Hip adduction    Hip internal rotation    Hip external rotation    Knee flexion 4+ 4+  Knee extension 4+ 4shaky  Ankle dorsiflexion    Ankle plantarflexion    Ankle inversion    Ankle eversion     (Blank rows = not tested)    FUNCTIONAL TESTS:  5 times sit to stand: 10.67 sec no UE support Timed up and go (TUG): 12.27 6 minute walk test: 892FT ; groin  pain after 5 mins;  GAIT: Distance walked: 892 FT Assistive device utilized: None Level of assistance: Complete Independence Comments: decreased step length on Rt; decreased stance time on Lt ; antaglic gait                                                                                                                                TREATMENT DATE:  09/25/2023 NuStep Level 5 6 mins- PT present to discuss status Supine glute stretch 2 x 30 sec on Lt  Supine hamstring stretch with green strap 2 x 30 sec on Lt Supine ITB stretch 2 x 30 sec on Lt Supine SLR 2 x 10 bilateral  Seated LAQ 2 x 10 bilateral  Sit to stand x 10 0# x 10 5# DB hold Standing hip abduction & march 2# AW 2 x 10 bilateral  6 in step ups at barre x 12 bilateral  6in lateral step ups x 12 bilateral  Seated clam with red TB 2 x 10 Seated band step overs (long band on floor) 2 x 10 bilateral  Unilateral farmer's carry (down long hallway and back) 5# x 2 laps switching hands  08/29/2023 Initial Evaluation & HEP created  Benefits of ice  PATIENT EDUCATION:  Education details: Ice; POC; examination findings; HEP Person educated: Patient Education method: Explanation, Demonstration, and Handouts Education comprehension: verbalized understanding, returned demonstration, and  needs further education  HOME EXERCISE PROGRAM: Access Code: [email or web address] URL: [email or web address] Date: 08/29/2023 Prepared by: Kristeen Sar  Exercises - Supine Gluteus Stretch  - 1 x daily - 7 x weekly - 2 sets - 20-30 hold - Supine Hamstring Stretch with Strap  - 1 x daily - 7 x weekly - 2 sets - 20-30 hold - Supine ITB Stretch with Strap  - 1 x daily - 7 x weekly - 2 sets - 20-30 hold - Seated Long Arc Quad  - 1 x daily - 7 x weekly - 2 sets - 10 reps - 2 hold  ASSESSMENT:  CLINICAL IMPRESSION: Alejandro Foster presents to first follow up appointment since evaluation. He has not been compliant with HEP but he has been walking more to get more movement in. Reviewed HEP and provided verbal and visual cues as needed. Introduced hip strengthening exercises and patient verbalized feeling a challenge. Overall, patient tolerated treatment session well. PT provided verbal and visual cues as needed during treatment session. Patient should respond well to physical therapy.  OBJECTIVE IMPAIRMENTS: Abnormal gait, decreased balance, difficulty walking, decreased ROM, decreased strength, increased muscle spasms, impaired flexibility, postural dysfunction, and pain.   ACTIVITY LIMITATIONS: carrying, bending, dressing, and locomotion level  PARTICIPATION LIMITATIONS: cleaning, laundry, community activity, school, and camping/ hiking  PERSONAL FACTORS: Age, Fitness, Time since onset of injury/illness/exacerbation, and 3+ comorbidities: Left femur fx May 2024; Type 2 DB;  peripheral neuropathy; HTN; are also affecting patient's functional outcome.   REHAB POTENTIAL: Good  CLINICAL DECISION MAKING: Stable/uncomplicated  EVALUATION  COMPLEXITY: Moderate   GOALS: Goals reviewed with patient? Yes  SHORT TERM GOALS: Target date: 09/26/2023  Patient will be independent with initial HEP. Baseline:  Goal status: INITIAL  2.  Patient will be able to walk 0.5 miles without left him pain or  discomfort. Baseline: increased pain and has to stop Goal status: INITIAL  3.  Patient will ambulate > or = to 1,000 feet during for improved community ambulation. Baseline: 849ft Goal status: INITIAL    LONG TERM GOALS: Target date: 10/24/2023  Patient will demonstrate independence in advanced HEP. Baseline:  Goal status: INITIAL  2.  Patient will be able to walk a mile with no left hip discomfort for improved cardiovascular endurance. Baseline: unable to walk 1 mile Goal status: INITIAL  3.  Patient will verbalize and demonstrate self-care strategies to manage pain including tissue mobility practices and change of position. Baseline:  Goal status: INITIAL  4.  Patient will be able to bend and put on his socks and shoes with no left hip discomfort. Baseline: 2/10 pain Goal status: INITIAL  5.  Patient will perform TUG in < or = to 9 sec to decrease risk of falls. Baseline: 12.27sec Goal status: INITIAL  6.  Patient will score 48/80 on LEFS due to improved function of Lt LE. Baseline: 38/80  Goal status: INITIAL   PLAN:  PT FREQUENCY: 2x/week  PT DURATION: 8 weeks  PLANNED INTERVENTIONS: 97164- PT Re-evaluation, 97110-Therapeutic exercises, 97530- Therapeutic activity, 97112- Neuromuscular re-education, 97535- Self Care, 02859- Manual therapy, [phone]- Gait training, [phone]- Canalith repositioning, [id number]- Aquatic Therapy, [phone]- Electrical stimulation (unattended), [phone]- Electrical stimulation (manual), [id number]- Ionotophoresis 4mg /ml Dexamethasone , 79439 (1-2 muscles), 20561 (3+ muscles)- Dry Needling, Patient/Family education, Balance training, Stair training, Taping, Joint mobilization, Joint manipulation, Spinal manipulation, Spinal mobilization, Vestibular training, Cryotherapy, and Moist heat  PLAN FOR NEXT SESSION: assess tolerance to treatment session; leg press; continue hip strengthening & balance training    Kristeen Sar, PT 09/25/23 11:02 AM [facility]  Specialty Rehab Services [address] [place], [place] [address] Phone # [phone] Fax [phone]

## 2023-10-01 NOTE — Telephone Encounter (Signed)
 SABRA

## 2023-10-02 ENCOUNTER — Ambulatory Visit

## 2023-10-02 DIAGNOSIS — M25552 Pain in left hip: Secondary | ICD-10-CM | POA: Diagnosis not present

## 2023-10-02 DIAGNOSIS — M6281 Muscle weakness (generalized): Secondary | ICD-10-CM

## 2023-10-02 DIAGNOSIS — R262 Difficulty in walking, not elsewhere classified: Secondary | ICD-10-CM

## 2023-10-02 DIAGNOSIS — R293 Abnormal posture: Secondary | ICD-10-CM

## 2023-10-02 DIAGNOSIS — R252 Cramp and spasm: Secondary | ICD-10-CM

## 2023-10-02 NOTE — Therapy (Signed)
 OUTPATIENT PHYSICAL THERAPY LOWER EXTREMITY TREATMENT   Patient Name: Alejandro Foster MRN: [id number] DOB:Oct 04, 1933, 89 y.o., male Today's Date: 10/02/2023  END OF SESSION:  PT End of Session - 10/02/23 1018     Visit Number 3    Date for PT Re-Evaluation 10/24/23    Authorization Type [employer] Medicare [employer] approved 16 visits 08/29/2023 - 10/24/2023 [id number]    Authorization Time Period 08/29/2023 - 10/24/2023    Authorization - Visit Number 3    Authorization - Number of Visits 16    Progress Note Due on Visit 10    PT Start Time 1019    PT Stop Time 1100    PT Time Calculation (min) 41 min    Activity Tolerance Patient tolerated treatment well    Behavior During Therapy WFL for tasks assessed/performed           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) 11/10/2019   Coronary artery disease    cardiologist-  dr blanca--  s/p  cardiac cath's w/ angioplasty and stenting x2   DDD (degenerative disc disease), lumbar    Diabetic peripheral neuropathy associated with type 2 diabetes mellitus (HCC) 12/12/2017   Dyslipidemia 01/25/2018   Dyspnea on exertion 08/22/2019   Foraminal stenosis of lumbar region 12/12/2017   History of kidney stones    Hyperlipidemia    Hypertension    Lumbar radiculopathy, right    right leg weakness   Myofascial pain syndrome 12/12/2017   Nephrolithiasis    bilateral non-obstructive per ct 01-19-2016   Peripheral neuropathy    feet   Posterior vitreous detachment of both eyes 11/10/2019   Pseudophakia of both eyes 11/10/2019   Right leg weakness    due to lumbar radiculopathy   Right ureteral stone    S/P coronary artery stent placement    2002 x1  and 2003 x1   Spondylosis without myelopathy or radiculopathy, lumbar region 12/12/2017    Trigger finger 05/28/2018   Type 2 diabetes mellitus (HCC)    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) 11/10/2019   Wears hearing aid    BILATERAL   Past Surgical History:  Procedure Laterality Date   BIOPSY  07/11/2022   Procedure: BIOPSY;  Surgeon: Saintclair Jasper, MD;  Location: [facility] ENDOSCOPY;  Service: Gastroenterology;;   BOTOX  INJECTION N/A 03/20/2023   Procedure: BOTOX  INJECTION;  Surgeon: Saintclair Jasper, MD;  Location: [facility];  Service: Gastroenterology;  Laterality: N/A;   CARDIAC CATHETERIZATION  01/ 2002   dr blanca   mLAD 30%,  CFX OM3 40%,  RCA 30% previous stent site   CARDIOVASCULAR STRESS TEST  04-15-2009  dr blanca   normal nuclear study w/ no ischemia/  normal LV function and wall motion , ef 77%   CATARACT EXTRACTION W/ INTRAOCULAR LENS  IMPLANT, BILATERAL  2013  approx.   COLONOSCOPY WITH PROPOFOL   2014   CORONARY ANGIOPLASTY  1990  dr blanca   PTCA to OM2   CORONARY ANGIOPLASTY  10/ 1996  dr blanca   PTCA to RCA   CORONARY ANGIOPLASTY WITH STENT PLACEMENT  04/ 1996  dr blanca   stenting to RCA   CYSTOSCOPY WITH URETEROSCOPY AND STENT PLACEMENT Right 03/28/2016   Procedure: CYSTOSCOPY  WITH RIGHT  URETEROSCOPY AND STENT PLACEMENT;  Surgeon: Donnice Brooks, MD;  Location: [facility];  Service: Urology;  Laterality: Right;   ESOPHAGOGASTRODUODENOSCOPY (EGD) WITH PROPOFOL  N/A 07/11/2022   Procedure: ESOPHAGOGASTRODUODENOSCOPY (EGD) WITH PROPOFOL ;  Surgeon: Saintclair Jasper, MD;  Location: [facility] ENDOSCOPY;  Service: Gastroenterology;  Laterality: N/A;   ESOPHAGOGASTRODUODENOSCOPY (EGD) WITH PROPOFOL  N/A 03/20/2023   Procedure: ESOPHAGOGASTRODUODENOSCOPY (EGD) WITH PROPOFOL ;  Surgeon: Saintclair Jasper, MD;  Location: [facility] ENDOSCOPY;  Service: Gastroenterology;  Laterality: N/A;  with botox  injection s   EXTRACORPOREAL SHOCK WAVE LITHOTRIPSY  yrs ago   HOLMIUM LASER APPLICATION Right 03/28/2016   Procedure: HOLMIUM  LASER APPLICATION;  Surgeon: Donnice Brooks, MD;  Location: [facility];  Service: Urology;  Laterality: Right;   INTRAMEDULLARY (IM) NAIL INTERTROCHANTERIC Left 07/07/2022   Procedure: INTRAMEDULLARY (IM) NAILING OF LEFT FEMUR;  Surgeon: Jerri Kay HERO, MD;  Location: [facility] OR;  Service: Orthopedics;  Laterality: Left;   LAPAROSCOPIC CHOLECYSTECTOMY  1990's   SUBMUCOSAL INJECTION  07/11/2022   Procedure: SUBMUCOSAL INJECTION;  Surgeon: Saintclair Jasper, MD;  Location: [facility] ENDOSCOPY;  Service: Gastroenterology;;   TONSILLECTOMY AND ADENOIDECTOMY  child   URETEROLITHOTOMY  1990's   Patient Active Problem List   Diagnosis Date Noted   Fall 07/06/2022   Displaced intertrochanteric fracture of left femur, initial encounter for closed fracture (HCC) 07/06/2022   CAD (coronary artery disease) 07/06/2022   Coronary artery disease 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) 05/12/2022   Paving stone retinal degeneration, bilateral 11/03/2021   Senile purpura (HCC) 09/07/2020   Claudication (HCC) 03/17/2020   Wears hearing aid    Type 2 diabetes mellitus (HCC)    S/P coronary artery stent placement    Right ureteral stone    Right leg weakness    Lumbar radiculopathy, right    History of kidney stones    DDD (degenerative disc disease), lumbar    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) 11/10/2019   Pseudophakia of both eyes 11/10/2019   Posterior vitreous detachment of both eyes 11/10/2019   Amnesia 09/19/2019   Dyspnea on exertion 08/22/2019   Atypical chest pain 08/22/2019   Diabetic retinopathy associated with type 2 diabetes mellitus (HCC) 03/27/2019   Trigger finger 05/28/2018   Dyslipidemia 01/25/2018   Diabetic peripheral neuropathy associated with type 2 diabetes mellitus (HCC) 12/12/2017   Chronic bilateral thoracic back pain 12/12/2017   Myofascial pain syndrome 12/12/2017   Foraminal  stenosis of lumbar region 12/12/2017   Spondylosis without myelopathy or radiculopathy, lumbar region 12/12/2017   Cough 09/17/2017   Irritability and anger 09/17/2017   Localized edema 08/01/2017   Abnormal gait 01/09/2017   Fatigue 09/12/2016   Hypertensive heart disease without congestive heart failure 09/03/2014   Long term (current) use of insulin  (HCC) 09/03/2014   Ventricular premature depolarization 09/03/2014   Encounter for general adult medical examination without abnormal findings 08/26/2014   Gout 08/28/2013   Impacted cerumen 08/28/2013   Hypo-osmolality and hyponatremia 08/28/2013   Urge incontinence of urine 08/26/2012   Age-related osteoporosis without current pathological fracture 02/19/2012   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   Vitamin D deficiency 02/19/2012   Underimmunization status 12/26/2011   Backache 08/21/2011   Hearing loss 05/23/2011   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) 03/22/2010   Kidney stone 05/25/2009   ED (erectile dysfunction) of organic origin 05/25/2009   Obesity 05/25/2009   Metabolic  syndrome 05/25/2009   Polyneuropathy 12/25/2008   Hyperglycemia due to type 2 diabetes mellitus (HCC) 12/25/2008   Gastro-esophageal reflux disease without esophagitis 12/25/2008   Insomnia 12/25/2008   Dilatation of aorta (HCC) 10/02/2008   Essential hypertension 10/02/2008   Hyperlipidemia 10/02/2008    PCP: Onita Rush, MD   REFERRING PROVIDER: Jerri Kay HERO, MD  REFERRING DIAG: [phone] (ICD-10-CM) - Displaced intertrochanteric fracture of left femur, initial encounter for closed fracture [facility])  THERAPY DIAG:  Pain in left hip  Difficulty in walking, not elsewhere classified  Muscle weakness (generalized)  Cramp and spasm  Abnormal posture  Rationale for Evaluation and Treatment: Rehabilitation  ONSET DATE: Jul 19 2022  SUBJECTIVE:   SUBJECTIVE STATEMENT: Patient reports he is doing a little  better every day.  He reports he is waking more and doing his exercises.   From Eval: Patient presents with left hip pain that began when he broke his femur Jul 19 2022.  He was in the hospital and did PT at [facility]. He has been traveling and he has gain some weight and he feels he is limping more. Patient reports he had imaging done today and every has healed fine there is just some inflammation. It has been challenging to putting on his shoes, bending, and he feels his balance is off. Previously he was able to walk a mile and now he is limited to 0.5 miles.   PERTINENT HISTORY: Left femur fx May 2024; Type 2 DB; lumbar DDD; peripheral neuropathy; HTN; PAIN:  10/02/23 Are you having pain? Yes: NPRS scale: 2-3/ 10 Pain location: lateral femur; where his scares or located Pain description: constant soreness; achy Aggravating factors: bending, walking Relieving factors: Tylenol    PRECAUTIONS: None  RED FLAGS: None   WEIGHT BEARING RESTRICTIONS: No  FALLS:  Has patient fallen in last 6 months? Yes. Number of falls 1 he fell in the hospital walking with his walker  LIVING ENVIRONMENT: Lives with: lives with their spouse Lives in: House/apartment Stairs: Yes: Internal: 12 steps; on left going up Has following equipment at home: Walking cane  OCCUPATION: Retired Nutritional therapist  PLOF: Independent, Independent with basic ADLs, Independent with household mobility without device, Independent with community mobility without device, Independent with gait, and Independent with transfers  PATIENT GOALS: To be able to go camping again and be about to walk without a limp  NEXT MD VISIT: PRN  OBJECTIVE:  Note: Objective measures were completed at Evaluation unless otherwise noted.  DIAGNOSTIC FINDINGS: No final results yet  PATIENT SURVEYS:  LEFS: 38/80 47.5%  COGNITION: Overall cognitive status: Within functional limits for tasks assessed     SENSATION: WFL    MUSCLE  LENGTH: Hamstrings: limited bilateral    POSTURE: rounded shoulders and forward head  PALPATION: Tenderness to palpation Lt TFL, glutes, and quads  LOWER EXTREMITY ROM: WFL; limited Lt hip IR/ER   LOWER EXTREMITY MMT:  MMT Right eval Left eval  Hip flexion 4+ 4-*  Hip extension    Hip abduction 4+ 4-  Hip adduction    Hip internal rotation    Hip external rotation    Knee flexion 4+ 4+  Knee extension 4+ 4shaky  Ankle dorsiflexion    Ankle plantarflexion    Ankle inversion    Ankle eversion     (Blank rows = not tested)    FUNCTIONAL TESTS:  5 times sit to stand: 10.67 sec no UE support Timed up and go (TUG): 12.27 6 minute  walk test: 892FT ; groin pain after 5 mins;  GAIT: Distance walked: 892 FT Assistive device utilized: None Level of assistance: Complete Independence Comments: decreased step length on Rt; decreased stance time on Lt ; antaglic gait                                                                                                                                TREATMENT DATE:  10/02/2023 NuStep Level 5 6 mins- PT present to discuss status Standing hamstring stretch 3 x 30 sec Standing quad/hip flexor stretch 3 x 30 sec Seated piriformis stretch 3 x 30 sec Lunge stretch on steps fwd 5 x 10 sec each LE Side lunge stretch for adductors 3 x 10 sec Lateral band walks with blue loop 3 laps of 10 feet Hip matrix hip abduction and extension both LE's with 40 lbs 2 x 10 each  09/25/2023 Supine glute stretch 2 x 30 sec on Lt  Supine hamstring stretch with green strap 2 x 30 sec on Lt Supine ITB stretch 2 x 30 sec on Lt Supine SLR 2 x 10 bilateral  Seated LAQ 2 x 10 bilateral  Sit to stand x 10 0# x 10 5# DB hold Standing hip abduction & march 2# AW 2 x 10 bilateral  6 in step ups at barre x 12 bilateral  6in lateral step ups x 12 bilateral  Seated clam with red TB 2 x 10 Seated band step overs (long band on floor) 2 x 10 bilateral  Unilateral  farmer's carry (down long hallway and back) 5# x 2 laps switching hands  08/29/2023 Initial Evaluation & HEP created  Benefits of ice  PATIENT EDUCATION:  Education details: Ice; POC; examination findings; HEP Person educated: Patient Education method: Explanation, Demonstration, and Handouts Education comprehension: verbalized understanding, returned demonstration, and needs further education  HOME EXERCISE PROGRAM: Access Code: [email or web address] URL: [email or web address] Date: 10/02/2023 Prepared by: Delon Haddock  Exercises - Supine Gluteus Stretch  - 1 x daily - 7 x weekly - 2 sets - 20-30 hold - Supine Hamstring Stretch with Strap  - 1 x daily - 7 x weekly - 2 sets - 20-30 hold - Supine ITB Stretch with Strap  - 1 x daily - 7 x weekly - 2 sets - 20-30 hold - Seated Long Arc Quad  - 1 x daily - 7 x weekly - 2 sets - 10 reps - 2 hold - Standing Hamstring Stretch on Chair  - 1 x daily - 7 x weekly - 1 sets - 3 reps - 30 sec hold - Quadricep Stretch with Chair and Counter Support  - 1 x daily - 7 x weekly - 1 sets - 3 reps - 30 sec hold - Seated Figure 4 Piriformis Stretch  - 1 x daily - 7 x weekly - 1 sets - 3 reps - 30 sed hold ASSESSMENT:  CLINICAL IMPRESSION: Trajan is progressing appropriately.  He  is extremely tight and with limited left hip mobility.  He has shortened step length and width.  We discussed being more intentional to correct this with his gait.  He will continue to work on this.  He was unable to get either foot up onto the knee with piriformis stretch so we modified to use a strap which he has at home from having had the previous hip injury.  He fatigues easily with hip abductor and hip ER strengthening indicating weakness in these areas.  He would benefit from continuing skilled PT to address these issues.    OBJECTIVE IMPAIRMENTS: Abnormal gait, decreased balance, difficulty walking, decreased ROM, decreased strength, increased muscle spasms, impaired  flexibility, postural dysfunction, and pain.   ACTIVITY LIMITATIONS: carrying, bending, dressing, and locomotion level  PARTICIPATION LIMITATIONS: cleaning, laundry, community activity, school, and camping/ hiking  PERSONAL FACTORS: Age, Fitness, Time since onset of injury/illness/exacerbation, and 3+ comorbidities: Left femur fx May 2024; Type 2 DB;  peripheral neuropathy; HTN; are also affecting patient's functional outcome.   REHAB POTENTIAL: Good  CLINICAL DECISION MAKING: Stable/uncomplicated  EVALUATION COMPLEXITY: Moderate   GOALS: Goals reviewed with patient? Yes  SHORT TERM GOALS: Target date: 09/26/2023  Patient will be independent with initial HEP. Baseline:  Goal status: In progress 10/02/23  2.  Patient will be able to walk 0.5 miles without left him pain or discomfort. Baseline: increased pain and has to stop Goal status: MET 10/02/23  3.  Patient will ambulate > or = to 1,000 feet during for improved community ambulation. Baseline: 848ft Goal status: In progress    LONG TERM GOALS: Target date: 10/24/2023  Patient will demonstrate independence in advanced HEP. Baseline:  Goal status: INITIAL  2.  Patient will be able to walk a mile with no left hip discomfort for improved cardiovascular endurance. Baseline: unable to walk 1 mile Goal status: INITIAL  3.  Patient will verbalize and demonstrate self-care strategies to manage pain including tissue mobility practices and change of position. Baseline:  Goal status: INITIAL  4.  Patient will be able to bend and put on his socks and shoes with no left hip discomfort. Baseline: 2/10 pain Goal status: INITIAL  5.  Patient will perform TUG in < or = to 9 sec to decrease risk of falls. Baseline: 12.27sec Goal status: INITIAL  6.  Patient will score 48/80 on LEFS due to improved function of Lt LE. Baseline: 38/80  Goal status: INITIAL   PLAN:  PT FREQUENCY: 2x/week  PT DURATION: 8 weeks  PLANNED  INTERVENTIONS: 97164- PT Re-evaluation, 97110-Therapeutic exercises, 97530- Therapeutic activity, 97112- Neuromuscular re-education, 97535- Self Care, 02859- Manual therapy, [phone]- Gait training, [phone]- Canalith repositioning, [id number]- Aquatic Therapy, [phone]- Electrical stimulation (unattended), [phone]- Electrical stimulation (manual), [id number]- Ionotophoresis 4mg /ml Dexamethasone , 79439 (1-2 muscles), [id number] (3+ muscles)- Dry Needling, Patient/Family education, Balance training, Stair training, Taping, Joint mobilization, Joint manipulation, Spinal manipulation, Spinal mobilization, Vestibular training, Cryotherapy, and Moist heat  PLAN FOR NEXT SESSION: Band walks, bilateral hip mobility exercises and stretching,  leg press; continue hip strengthening & balance training    Eula Mazzola B. Ardra Kuznicki, PT 10/02/23 11:50 AM [facility] [address] [place], [place] [address] Phone # [phone] Fax [phone]

## 2023-10-04 ENCOUNTER — Encounter: Payer: Self-pay | Admitting: Physical Therapy

## 2023-10-04 ENCOUNTER — Ambulatory Visit: Admitting: Physical Therapy

## 2023-10-04 DIAGNOSIS — M25552 Pain in left hip: Secondary | ICD-10-CM | POA: Diagnosis not present

## 2023-10-04 DIAGNOSIS — R2689 Other abnormalities of gait and mobility: Secondary | ICD-10-CM

## 2023-10-04 DIAGNOSIS — M6281 Muscle weakness (generalized): Secondary | ICD-10-CM

## 2023-10-04 NOTE — Therapy (Signed)
 OUTPATIENT PHYSICAL THERAPY LOWER EXTREMITY TREATMENT   Patient Name: Alejandro Foster MRN: [id number] DOB:[birth year], 89 y.o., male Today's Date: 10/04/2023  END OF SESSION:  PT End of Session - 10/04/23 1318     Visit Number 4    Date for PT Re-Evaluation 10/24/23    Authorization Type [employer] Medicare [employer] approved 16 visits 08/29/2023 - 10/24/2023 [id number]    Authorization Time Period 08/29/2023 - 10/24/2023    Authorization - Visit Number 4    Authorization - Number of Visits 16    Progress Note Due on Visit 10    PT Start Time 1230    PT Stop Time 1310    PT Time Calculation (min) 40 min    Activity Tolerance Patient tolerated treatment well    Behavior During Therapy WFL for tasks assessed/performed            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) 11/10/2019   Coronary artery disease    cardiologist-  dr blanca--  s/p  cardiac cath's w/ angioplasty and stenting x2   DDD (degenerative disc disease), lumbar    Diabetic peripheral neuropathy associated with type 2 diabetes mellitus (HCC) 12/12/2017   Dyslipidemia 01/25/2018   Dyspnea on exertion 08/22/2019   Foraminal stenosis of lumbar region 12/12/2017   History of kidney stones    Hyperlipidemia    Hypertension    Lumbar radiculopathy, right    right leg weakness   Myofascial pain syndrome 12/12/2017   Nephrolithiasis    bilateral non-obstructive per ct 01-19-2016   Peripheral neuropathy    feet   Posterior vitreous detachment of both eyes 11/10/2019   Pseudophakia of both eyes 11/10/2019   Right leg weakness    due to lumbar radiculopathy   Right ureteral stone    S/P coronary artery stent placement    2002 x1  and 2003 x1   Spondylosis without myelopathy or radiculopathy, lumbar region 12/12/2017    Trigger finger 05/28/2018   Type 2 diabetes mellitus (HCC)    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) 11/10/2019   Wears hearing aid    BILATERAL   Past Surgical History:  Procedure Laterality Date   BIOPSY  07/11/2022   Procedure: BIOPSY;  Surgeon: Saintclair Jasper, MD;  Location: [facility] ENDOSCOPY;  Service: Gastroenterology;;   BOTOX  INJECTION N/A 03/20/2023   Procedure: BOTOX  INJECTION;  Surgeon: Saintclair Jasper, MD;  Location: [facility];  Service: Gastroenterology;  Laterality: N/A;   CARDIAC CATHETERIZATION  01/ 2002   dr blanca   mLAD 30%,  CFX OM3 40%,  RCA 30% previous stent site   CARDIOVASCULAR STRESS TEST  04-15-2009  dr blanca   normal nuclear study w/ no ischemia/  normal LV function and wall motion , ef 77%   CATARACT EXTRACTION W/ INTRAOCULAR LENS  IMPLANT, BILATERAL  2013  approx.   COLONOSCOPY WITH PROPOFOL   2014   CORONARY ANGIOPLASTY  1990  dr blanca   PTCA to OM2   CORONARY ANGIOPLASTY  10/ 1996  dr blanca   PTCA to RCA   CORONARY ANGIOPLASTY WITH STENT PLACEMENT  04/ 1996  dr blanca   stenting to RCA   CYSTOSCOPY WITH URETEROSCOPY AND STENT PLACEMENT Right 03/28/2016   Procedure:  CYSTOSCOPY WITH RIGHT  URETEROSCOPY AND STENT PLACEMENT;  Surgeon: Donnice Brooks, MD;  Location: [facility];  Service: Urology;  Laterality: Right;   ESOPHAGOGASTRODUODENOSCOPY (EGD) WITH PROPOFOL  N/A 07/11/2022   Procedure: ESOPHAGOGASTRODUODENOSCOPY (EGD) WITH PROPOFOL ;  Surgeon: Saintclair Jasper, MD;  Location: [facility] ENDOSCOPY;  Service: Gastroenterology;  Laterality: N/A;   ESOPHAGOGASTRODUODENOSCOPY (EGD) WITH PROPOFOL  N/A 03/20/2023   Procedure: ESOPHAGOGASTRODUODENOSCOPY (EGD) WITH PROPOFOL ;  Surgeon: Saintclair Jasper, MD;  Location: [facility] ENDOSCOPY;  Service: Gastroenterology;  Laterality: N/A;  with botox  injection s   EXTRACORPOREAL SHOCK WAVE LITHOTRIPSY  yrs ago   HOLMIUM LASER APPLICATION Right 03/28/2016   Procedure: HOLMIUM  LASER APPLICATION;  Surgeon: Donnice Brooks, MD;  [facility]: [facility];  Service: Urology;  Laterality: Right;   INTRAMEDULLARY (IM) NAIL INTERTROCHANTERIC Left 07/07/2022   Procedure: INTRAMEDULLARY (IM) NAILING OF LEFT FEMUR;  Surgeon: Jerri Kay HERO, MD;  Location: [facility] OR;  Service: Orthopedics;  Laterality: Left;   LAPAROSCOPIC CHOLECYSTECTOMY  1990's   SUBMUCOSAL INJECTION  07/11/2022   Procedure: SUBMUCOSAL INJECTION;  Surgeon: Saintclair Jasper, MD;  Location: [facility] ENDOSCOPY;  Service: Gastroenterology;;   TONSILLECTOMY AND ADENOIDECTOMY  child   URETEROLITHOTOMY  1990's   Patient Active Problem List   Diagnosis Date Noted   Fall 07/06/2022   Displaced intertrochanteric fracture of left femur, initial encounter for closed fracture (HCC) 07/06/2022   CAD (coronary artery disease) 07/06/2022   Coronary artery disease 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) 05/12/2022   Paving stone retinal degeneration, bilateral 11/03/2021   Senile purpura (HCC) 09/07/2020   Claudication (HCC) 03/17/2020   Wears hearing aid    Type 2 diabetes mellitus (HCC)    S/P coronary artery stent placement    Right ureteral stone    Right leg weakness    Lumbar radiculopathy, right    History of kidney stones    DDD (degenerative disc disease), lumbar    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) 11/10/2019   Pseudophakia of both eyes 11/10/2019   Posterior vitreous detachment of both eyes 11/10/2019   Amnesia 09/19/2019   Dyspnea on exertion 08/22/2019   Atypical chest pain 08/22/2019   Diabetic retinopathy associated with type 2 diabetes mellitus (HCC) 03/27/2019   Trigger finger 05/28/2018   Dyslipidemia 01/25/2018   Diabetic peripheral neuropathy associated with type 2 diabetes mellitus (HCC) 12/12/2017   Chronic bilateral thoracic back pain 12/12/2017   Myofascial pain syndrome 12/12/2017   Foraminal  stenosis of lumbar region 12/12/2017   Spondylosis without myelopathy or radiculopathy, lumbar region 12/12/2017   Cough 09/17/2017   Irritability and anger 09/17/2017   Localized edema 08/01/2017   Abnormal gait 01/09/2017   Fatigue 09/12/2016   Hypertensive heart disease without congestive heart failure 09/03/2014   Long term (current) use of insulin  (HCC) 09/03/2014   Ventricular premature depolarization 09/03/2014   Encounter for general adult medical examination without abnormal findings 08/26/2014   Gout 08/28/2013   Impacted cerumen 08/28/2013   Hypo-osmolality and hyponatremia 08/28/2013   Urge incontinence of urine 08/26/2012   Age-related osteoporosis without current pathological fracture 02/19/2012   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   Vitamin D deficiency 02/19/2012   Underimmunization status 12/26/2011   Backache 08/21/2011   Hearing loss 05/23/2011   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) 03/22/2010   Kidney stone 05/25/2009   ED (erectile dysfunction) of organic origin 05/25/2009   Obesity 05/25/2009  Metabolic syndrome 05/25/2009   Polyneuropathy 12/25/2008   Hyperglycemia due to type 2 diabetes mellitus (HCC) 12/25/2008   Gastro-esophageal reflux disease without esophagitis 12/25/2008   Insomnia 12/25/2008   Dilatation of aorta (HCC) 10/02/2008   Essential hypertension 10/02/2008   Hyperlipidemia 10/02/2008    PCP: Onita Rush, MD   REFERRING PROVIDER: Jerri Kay HERO, MD  REFERRING DIAG: [id number] (ICD-10-CM) - Displaced intertrochanteric fracture of left femur, initial encounter for closed fracture [facility])  THERAPY DIAG:  Pain in left hip  Muscle weakness (generalized)  Other abnormalities of gait and mobility  Rationale for Evaluation and Treatment: Rehabilitation  ONSET DATE: Jul 19 2022  SUBJECTIVE:   SUBJECTIVE STATEMENT: Patient reports he had pain going his exercises yesterday. But today he woke up and  felt fine. No pain currently.  From Eval: Patient presents with left hip pain that began when he broke his femur Jul 19 2022.  He was in the hospital and did PT at [facility]. He has been traveling and he has gain some weight and he feels he is limping more. Patient reports he had imaging done today and every has healed fine there is just some inflammation. It has been challenging to putting on his shoes, bending, and he feels his balance is off. Previously he was able to walk a mile and now he is limited to 0.5 miles.   PERTINENT HISTORY: Left femur fx May 2024; Type 2 DB; lumbar DDD; peripheral neuropathy; HTN; PAIN:  10/02/23 Are you having pain? Yes: NPRS scale: 2-3/ 10 Pain location: lateral femur; where his scares or located Pain description: constant soreness; achy Aggravating factors: bending, walking Relieving factors: Tylenol    PRECAUTIONS: None  RED FLAGS: None   WEIGHT BEARING RESTRICTIONS: No  FALLS:  Has patient fallen in last 6 months? Yes. Number of falls 1 he fell in the hospital walking with his walker  LIVING ENVIRONMENT: Lives with: lives with their spouse Lives in: House/apartment Stairs: Yes: Internal: 12 steps; on left going up Has following equipment at home: Walking cane  OCCUPATION: Retired Nutritional therapist  PLOF: Independent, Independent with basic ADLs, Independent with household mobility without device, Independent with community mobility without device, Independent with gait, and Independent with transfers  PATIENT GOALS: To be able to go camping again and be about to walk without a limp  NEXT MD VISIT: PRN  OBJECTIVE:  Note: Objective measures were completed at Evaluation unless otherwise noted.  DIAGNOSTIC FINDINGS: No final results yet  PATIENT SURVEYS:  LEFS: 38/80 47.5%  COGNITION: Overall cognitive status: Within functional limits for tasks assessed     SENSATION: WFL    MUSCLE LENGTH: Hamstrings: limited bilateral     POSTURE: rounded shoulders and forward head  PALPATION: Tenderness to palpation Lt TFL, glutes, and quads  LOWER EXTREMITY ROM: WFL; limited Lt hip IR/ER   LOWER EXTREMITY MMT:  MMT Right eval Left eval  Hip flexion 4+ 4-*  Hip extension    Hip abduction 4+ 4-  Hip adduction    Hip internal rotation    Hip external rotation    Knee flexion 4+ 4+  Knee extension 4+ 4shaky  Ankle dorsiflexion    Ankle plantarflexion    Ankle inversion    Ankle eversion     (Blank rows = not tested)    FUNCTIONAL TESTS:  5 times sit to stand: 10.67 sec no UE support Timed up and go (TUG): 12.27 6 minute walk test: 892FT ; groin pain after 5  mins;  GAIT: Distance walked: 892 FT Assistive device utilized: None Level of assistance: Complete Independence Comments: decreased step length on Rt; decreased stance time on Lt ; antaglic gait                                                                                                                                TREATMENT DATE:  10/04/2023 NuStep Level 5 5 mins- PT present to discuss status Standing hamstring stretch 2 x 30 sec bilateral  Standing quad/hip flexor stretch 2 x 30 sec bilateral  Seated piriformis stretch 2 x 30 sec bilateral (using green strap on Lt ) Side lunge stretch for adductors 3 x 10 sec Lateral band walks with blue loop 3 laps of 10 feet 6 in step ups at barre x 12 bilateral  6in lateral step ups x 12 bilateral  Sit to stand  5# DB hold 2 x 10 Hip matrix hip abduction and extension both LE's with 40 lbs 2 x 10 each Seated LAQ 3# AW 2 x 10 bilateral  Seated clam with red TB 2 x 10 Unilateral farmer's carry (down long hallway and back) 7# x 2 laps switching hands      10/02/2023 NuStep Level 5 6 mins- PT present to discuss status Standing hamstring stretch 3 x 30 sec Standing quad/hip flexor stretch 3 x 30 sec Seated piriformis stretch 3 x 30 sec Lunge stretch on steps fwd 5 x 10 sec each LE Side  lunge stretch for adductors 3 x 10 sec Lateral band walks with blue loop 3 laps of 10 feet Hip matrix hip abduction and extension both LE's with 40 lbs 2 x 10 each  09/25/2023 Supine glute stretch 2 x 30 sec on Lt  Supine hamstring stretch with green strap 2 x 30 sec on Lt Supine ITB stretch 2 x 30 sec on Lt Supine SLR 2 x 10 bilateral  Seated LAQ 2 x 10 bilateral  Sit to stand x 10 0# x 10 5# DB hold Standing hip abduction & march 2# AW 2 x 10 bilateral  6 in step ups at barre x 12 bilateral  6in lateral step ups x 12 bilateral  Seated clam with red TB 2 x 10 Seated band step overs (long band on floor) 2 x 10 bilateral  Unilateral farmer's carry (down long hallway and back) 5# x 2 laps switching hands  08/29/2023 Initial Evaluation & HEP created  Benefits of ice  PATIENT EDUCATION:  Education details: Ice; POC; examination findings; HEP Person educated: Patient Education method: Explanation, Demonstration, and Handouts Education comprehension: verbalized understanding, returned demonstration, and needs further education  HOME EXERCISE PROGRAM: Access Code: [email or web address] URL: [email or web address] Date: 10/02/2023 Prepared by: Delon Haddock  Exercises - Supine Gluteus Stretch  - 1 x daily - 7 x weekly - 2 sets - 20-30 hold - Supine Hamstring Stretch with Strap  - 1 x daily - 7 x weekly - 2  sets - 20-30 hold - Supine ITB Stretch with Strap  - 1 x daily - 7 x weekly - 2 sets - 20-30 hold - Seated Long Arc Quad  - 1 x daily - 7 x weekly - 2 sets - 10 reps - 2 hold - Standing Hamstring Stretch on Chair  - 1 x daily - 7 x weekly - 1 sets - 3 reps - 30 sec hold - Quadricep Stretch with Chair and Counter Support  - 1 x daily - 7 x weekly - 1 sets - 3 reps - 30 sec hold - Seated Figure 4 Piriformis Stretch  - 1 x daily - 7 x weekly - 1 sets - 3 reps - 30 sed hold ASSESSMENT:  CLINICAL IMPRESSION: Theo presents to therapy with no pain. He has been compliant with updated  HEP. He verbalized increased challenge when performing seated piriformis stretch. Patient tolerated treatment session well. He was able to perform exercises with increased weight with no difficulty. Noted good carryover with gait mechanics from last session. Patient should continue to perform well from PT.   OBJECTIVE IMPAIRMENTS: Abnormal gait, decreased balance, difficulty walking, decreased ROM, decreased strength, increased muscle spasms, impaired flexibility, postural dysfunction, and pain.   ACTIVITY LIMITATIONS: carrying, bending, dressing, and locomotion level  PARTICIPATION LIMITATIONS: cleaning, laundry, community activity, school, and camping/ hiking  PERSONAL FACTORS: Age, Fitness, Time since onset of injury/illness/exacerbation, and 3+ comorbidities: Left femur fx May 2024; Type 2 DB;  peripheral neuropathy; HTN; are also affecting patient's functional outcome.   REHAB POTENTIAL: Good  CLINICAL DECISION MAKING: Stable/uncomplicated  EVALUATION COMPLEXITY: Moderate   GOALS: Goals reviewed with patient? Yes  SHORT TERM GOALS: Target date: 09/26/2023  Patient will be independent with initial HEP. Baseline:  Goal status: In progress 10/02/23  2.  Patient will be able to walk 0.5 miles without left him pain or discomfort. Baseline: increased pain and has to stop Goal status: MET 10/02/23  3.  Patient will ambulate > or = to 1,000 feet during for improved community ambulation. Baseline: 824ft Goal status: In progress    LONG TERM GOALS: Target date: 10/24/2023  Patient will demonstrate independence in advanced HEP. Baseline:  Goal status: INITIAL  2.  Patient will be able to walk a mile with no left hip discomfort for improved cardiovascular endurance. Baseline: unable to walk 1 mile Goal status: INITIAL  3.  Patient will verbalize and demonstrate self-care strategies to manage pain including tissue mobility practices and change of position. Baseline:  Goal  status: INITIAL  4.  Patient will be able to bend and put on his socks and shoes with no left hip discomfort. Baseline: 2/10 pain Goal status: INITIAL  5.  Patient will perform TUG in < or = to 9 sec to decrease risk of falls. Baseline: 12.27sec Goal status: INITIAL  6.  Patient will score 48/80 on LEFS due to improved function of Lt LE. Baseline: 38/80  Goal status: INITIAL   PLAN:  PT FREQUENCY: 2x/week  PT DURATION: 8 weeks  PLANNED INTERVENTIONS: 97164- PT Re-evaluation, 97110-Therapeutic exercises, 97530- Therapeutic activity, 97112- Neuromuscular re-education, 97535- Self Care, 02859- Manual therapy, [phone]- Gait training, [phone]- Canalith repositioning, [id number]- Aquatic Therapy, [phone]- Electrical stimulation (unattended), [phone]- Electrical stimulation (manual), [id number]- Ionotophoresis 4mg /ml Dexamethasone , 79439 (1-2 muscles), 20561 (3+ muscles)- Dry Needling, Patient/Family education, Balance training, Stair training, Taping, Joint mobilization, Joint manipulation, Spinal manipulation, Spinal mobilization, Vestibular training, Cryotherapy, and Moist heat  PLAN FOR NEXT SESSION:  bilateral hip mobility  exercises and stretching,  leg press; continue hip strengthening & balance training    Kristeen Sar, PT 10/04/23 1:19 PM [facility] [address], [address] [place], [place] [address] Phone # [phone] Fax [phone]

## 2023-10-05 ENCOUNTER — Other Ambulatory Visit: Payer: Self-pay | Admitting: Urology

## 2023-10-09 ENCOUNTER — Ambulatory Visit: Admitting: Physical Therapy

## 2023-10-09 ENCOUNTER — Encounter: Payer: Self-pay | Admitting: Physical Therapy

## 2023-10-09 DIAGNOSIS — R2689 Other abnormalities of gait and mobility: Secondary | ICD-10-CM

## 2023-10-09 DIAGNOSIS — M6281 Muscle weakness (generalized): Secondary | ICD-10-CM

## 2023-10-09 DIAGNOSIS — M25552 Pain in left hip: Secondary | ICD-10-CM

## 2023-10-09 NOTE — Therapy (Signed)
 OUTPATIENT PHYSICAL THERAPY LOWER EXTREMITY TREATMENT   Patient Name: Alejandro Foster MRN: [id number] DOB:14-Jan-1934, 89 y.o., male Today's Date: 10/09/2023  END OF SESSION:  PT End of Session - 10/09/23 1106     Visit Number 5    Date for PT Re-Evaluation 10/24/23    Authorization Type [employer] Medicare [employer] approved 16 visits 08/29/2023 - 10/24/2023 [id number]    Authorization Time Period 08/29/2023 - 10/24/2023    Authorization - Visit Number 5    Authorization - Number of Visits 16    Progress Note Due on Visit 10    PT Start Time 1018    PT Stop Time 1058    PT Time Calculation (min) 40 min    Activity Tolerance Patient tolerated treatment well    Behavior During Therapy WFL for tasks assessed/performed             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) 11/10/2019   Coronary artery disease    cardiologist-  dr blanca--  s/p  cardiac cath's w/ angioplasty and stenting x2   DDD (degenerative disc disease), lumbar    Diabetic peripheral neuropathy associated with type 2 diabetes mellitus (HCC) 12/12/2017   Dyslipidemia 01/25/2018   Dyspnea on exertion 08/22/2019   Foraminal stenosis of lumbar region 12/12/2017   History of kidney stones    Hyperlipidemia    Hypertension    Lumbar radiculopathy, right    right leg weakness   Myofascial pain syndrome 12/12/2017   Nephrolithiasis    bilateral non-obstructive per ct 01-19-2016   Peripheral neuropathy    feet   Posterior vitreous detachment of both eyes 11/10/2019   Pseudophakia of both eyes 11/10/2019   Right leg weakness    due to lumbar radiculopathy   Right ureteral stone    S/P coronary artery stent placement    2002 x1  and 2003 x1   Spondylosis without myelopathy or radiculopathy, lumbar region 12/12/2017    Trigger finger 05/28/2018   Type 2 diabetes mellitus (HCC)    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) 11/10/2019   Wears hearing aid    BILATERAL   Past Surgical History:  Procedure Laterality Date   BIOPSY  07/11/2022   Procedure: BIOPSY;  Surgeon: Saintclair Jasper, MD;  Location: [facility] ENDOSCOPY;  Service: Gastroenterology;;   BOTOX  INJECTION N/A 03/20/2023   Procedure: BOTOX  INJECTION;  Surgeon: Saintclair Jasper, MD;  Location: [facility];  Service: Gastroenterology;  Laterality: N/A;   CARDIAC CATHETERIZATION  01/ 2002   dr blanca   mLAD 30%,  CFX OM3 40%,  RCA 30% previous stent site   CARDIOVASCULAR STRESS TEST  04-15-2009  dr blanca   normal nuclear study w/ no ischemia/  normal LV function and wall motion , ef 77%   CATARACT EXTRACTION W/ INTRAOCULAR LENS  IMPLANT, BILATERAL  2013  approx.   COLONOSCOPY WITH PROPOFOL   2014   CORONARY ANGIOPLASTY  1990  dr blanca   PTCA to OM2   CORONARY ANGIOPLASTY  10/ 1996  dr blanca   PTCA to RCA   CORONARY ANGIOPLASTY WITH STENT PLACEMENT  04/ 1996  dr blanca   stenting to RCA   CYSTOSCOPY WITH URETEROSCOPY AND STENT PLACEMENT Right 03/28/2016  Procedure: CYSTOSCOPY WITH RIGHT  URETEROSCOPY AND STENT PLACEMENT;  Surgeon: Donnice Brooks, MD;  Location: [facility];  Service: Urology;  Laterality: Right;   ESOPHAGOGASTRODUODENOSCOPY (EGD) WITH PROPOFOL  N/A 07/11/2022   Procedure: ESOPHAGOGASTRODUODENOSCOPY (EGD) WITH PROPOFOL ;  Surgeon: Saintclair Jasper, MD;  Location: [facility] ENDOSCOPY;  Service: Gastroenterology;  Laterality: N/A;   ESOPHAGOGASTRODUODENOSCOPY (EGD) WITH PROPOFOL  N/A 03/20/2023   Procedure: ESOPHAGOGASTRODUODENOSCOPY (EGD) WITH PROPOFOL ;  Surgeon: Saintclair Jasper, MD;  Location: [facility] ENDOSCOPY;  Service: Gastroenterology;  Laterality: N/A;  with botox  injection s   EXTRACORPOREAL SHOCK WAVE LITHOTRIPSY  yrs ago   HOLMIUM LASER APPLICATION Right 03/28/2016   Procedure: HOLMIUM  LASER APPLICATION;  Surgeon: Donnice Brooks, MD;  Location: [facility];  Service: Urology;  Laterality: Right;   INTRAMEDULLARY (IM) NAIL INTERTROCHANTERIC Left 07/07/2022   Procedure: INTRAMEDULLARY (IM) NAILING OF LEFT FEMUR;  Surgeon: Jerri Kay HERO, MD;  Location: [facility] OR;  Service: Orthopedics;  Laterality: Left;   LAPAROSCOPIC CHOLECYSTECTOMY  1990's   SUBMUCOSAL INJECTION  07/11/2022   Procedure: SUBMUCOSAL INJECTION;  Surgeon: Saintclair Jasper, MD;  Location: [facility] ENDOSCOPY;  Service: Gastroenterology;;   TONSILLECTOMY AND ADENOIDECTOMY  child   URETEROLITHOTOMY  1990's   Patient Active Problem List   Diagnosis Date Noted   Fall 07/06/2022   Displaced intertrochanteric fracture of left femur, initial encounter for closed fracture (HCC) 07/06/2022   CAD (coronary artery disease) 07/06/2022   Coronary artery disease 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) 05/12/2022   Paving stone retinal degeneration, bilateral 11/03/2021   Senile purpura (HCC) 09/07/2020   Claudication (HCC) 03/17/2020   Wears hearing aid    Type 2 diabetes mellitus (HCC)    S/P coronary artery stent placement    Right ureteral stone    Right leg weakness    Lumbar radiculopathy, right    History of kidney stones    DDD (degenerative disc disease), lumbar    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) 11/10/2019   Pseudophakia of both eyes 11/10/2019   Posterior vitreous detachment of both eyes 11/10/2019   Amnesia 09/19/2019   Dyspnea on exertion 08/22/2019   Atypical chest pain 08/22/2019   Diabetic retinopathy associated with type 2 diabetes mellitus (HCC) 03/27/2019   Trigger finger 05/28/2018   Dyslipidemia 01/25/2018   Diabetic peripheral neuropathy associated with type 2 diabetes mellitus (HCC) 12/12/2017   Chronic bilateral thoracic back pain 12/12/2017   Myofascial pain syndrome 12/12/2017   Foraminal  stenosis of lumbar region 12/12/2017   Spondylosis without myelopathy or radiculopathy, lumbar region 12/12/2017   Cough 09/17/2017   Irritability and anger 09/17/2017   Localized edema 08/01/2017   Abnormal gait 01/09/2017   Fatigue 09/12/2016   Hypertensive heart disease without congestive heart failure 09/03/2014   Long term (current) use of insulin  (HCC) 09/03/2014   Ventricular premature depolarization 09/03/2014   Encounter for general adult medical examination without abnormal findings 08/26/2014   Gout 08/28/2013   Impacted cerumen 08/28/2013   Hypo-osmolality and hyponatremia 08/28/2013   Urge incontinence of urine 08/26/2012   Age-related osteoporosis without current pathological fracture 02/19/2012   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   Vitamin D deficiency 02/19/2012   Underimmunization status 12/26/2011   Backache 08/21/2011   Hearing loss 05/23/2011   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) 03/22/2010   Kidney stone 05/25/2009   ED (erectile dysfunction) of organic origin 05/25/2009   Obesity 05/25/2009  Metabolic syndrome 05/25/2009   Polyneuropathy 12/25/2008   Hyperglycemia due to type 2 diabetes mellitus (HCC) 12/25/2008   Gastro-esophageal reflux disease without esophagitis 12/25/2008   Insomnia 12/25/2008   Dilatation of aorta (HCC) 10/02/2008   Essential hypertension 10/02/2008   Hyperlipidemia 10/02/2008    PCP: Onita Rush, MD   REFERRING PROVIDER: Jerri Kay HERO, MD  REFERRING DIAG: [phone] (ICD-10-CM) - Displaced intertrochanteric fracture of left femur, initial encounter for closed fracture [facility])  THERAPY DIAG:  Pain in left hip  Muscle weakness (generalized)  Other abnormalities of gait and mobility  Rationale for Evaluation and Treatment: Rehabilitation  ONSET DATE: Jul 19 2022  SUBJECTIVE:   SUBJECTIVE STATEMENT: Patient reports he is doing good today. He has been doing his exercises and they are  going well.  From Eval: Patient presents with left hip pain that began when he broke his femur Jul 19 2022.  He was in the hospital and did PT at [facility]. He has been traveling and he has gain some weight and he feels he is limping more. Patient reports he had imaging done today and every has healed fine there is just some inflammation. It has been challenging to putting on his shoes, bending, and he feels his balance is off. Previously he was able to walk a mile and now he is limited to 0.5 miles.   PERTINENT HISTORY: Left femur fx May 2024; Type 2 DB; lumbar DDD; peripheral neuropathy; HTN; PAIN:  10/02/23 Are you having pain? Yes: NPRS scale: 2-3/ 10 Pain location: lateral femur; where his scares or located Pain description: constant soreness; achy Aggravating factors: bending, walking Relieving factors: Tylenol    PRECAUTIONS: None  RED FLAGS: None   WEIGHT BEARING RESTRICTIONS: No  FALLS:  Has patient fallen in last 6 months? Yes. Number of falls 1 he fell in the hospital walking with his walker  LIVING ENVIRONMENT: Lives with: lives with their spouse Lives in: House/apartment Stairs: Yes: Internal: 12 steps; on left going up Has following equipment at home: Walking cane  OCCUPATION: Retired Nutritional therapist  PLOF: Independent, Independent with basic ADLs, Independent with household mobility without device, Independent with community mobility without device, Independent with gait, and Independent with transfers  PATIENT GOALS: To be able to go camping again and be about to walk without a limp  NEXT MD VISIT: PRN  OBJECTIVE:  Note: Objective measures were completed at Evaluation unless otherwise noted.  DIAGNOSTIC FINDINGS: No final results yet  PATIENT SURVEYS:  LEFS: 38/80 47.5%  COGNITION: Overall cognitive status: Within functional limits for tasks assessed     SENSATION: WFL    MUSCLE LENGTH: Hamstrings: limited bilateral    POSTURE: rounded  shoulders and forward head  PALPATION: Tenderness to palpation Lt TFL, glutes, and quads  LOWER EXTREMITY ROM: WFL; limited Lt hip IR/ER   LOWER EXTREMITY MMT:  MMT Right eval Left eval  Hip flexion 4+ 4-*  Hip extension    Hip abduction 4+ 4-  Hip adduction    Hip internal rotation    Hip external rotation    Knee flexion 4+ 4+  Knee extension 4+ 4shaky  Ankle dorsiflexion    Ankle plantarflexion    Ankle inversion    Ankle eversion     (Blank rows = not tested)    FUNCTIONAL TESTS:  5 times sit to stand: 10.67 sec no UE support Timed up and go (TUG): 12.27 6 minute walk test: 892FT ; groin pain after 5 mins;  GAIT: Distance walked: 892 FT Assistive device utilized: None Level of assistance: Complete Independence Comments: decreased step length on Rt; decreased stance time on Lt ; antaglic gait                                                                                                                                TREATMENT DATE:  10/09/2023 NuStep Level 6 6 mins- PT present to discuss status Standing hamstring stretch 3 x 30 sec bilateral  Standing quad/hip flexor stretch 2 x 30 sec bilateral  Side stepping with green TB around ankles x 3 laps in // bars 6 in step ups at barre x 12 bilateral  6in lateral step ups x 12 bilateral  Hip matrix hip abduction and extension both LE's with 40 lbs 2 x 10 each Sit to stand with 7# DB 2x 0 Unilateral farmer's carry (down long hallway and back) 7# x 2 laps switching hands     10/04/2023 NuStep Level 5 5 mins- PT present to discuss status Standing hamstring stretch 2 x 30 sec bilateral  Standing quad/hip flexor stretch 2 x 30 sec bilateral  Seated piriformis stretch 2 x 30 sec bilateral (using green strap on Lt ) Side lunge stretch for adductors 3 x 10 sec Lateral band walks with blue loop 3 laps of 10 feet 6 in step ups at barre x 12 bilateral  6in lateral step ups x 12 bilateral  Sit to stand  5# DB hold 2  x 10 Hip matrix hip abduction and extension both LE's with 40 lbs 2 x 10 each Seated LAQ 3# AW 2 x 10 bilateral  Seated clam with red TB 2 x 10 Unilateral farmer's carry (down long hallway and back) 7# x 2 laps switching hands      10/02/2023 NuStep Level 5 6 mins- PT present to discuss status Standing hamstring stretch 3 x 30 sec Standing quad/hip flexor stretch 3 x 30 sec Seated piriformis stretch 3 x 30 sec Lunge stretch on steps fwd 5 x 10 sec each LE Side lunge stretch for adductors 3 x 10 sec Lateral band walks with blue loop 3 laps of 10 feet Hip matrix hip abduction and extension both LE's with 40 lbs 2 x 10 each  PATIENT EDUCATION:  Education details: Ice; POC; examination findings; HEP Person educated: Patient Education method: Explanation, Demonstration, and Handouts Education comprehension: verbalized understanding, returned demonstration, and needs further education  HOME EXERCISE PROGRAM: Access Code: [email or web address] URL: [email or web address] Date: 10/02/2023 Prepared by: Delon Haddock  Exercises - Supine Gluteus Stretch  - 1 x daily - 7 x weekly - 2 sets - 20-30 hold - Supine Hamstring Stretch with Strap  - 1 x daily - 7 x weekly - 2 sets - 20-30 hold - Supine ITB Stretch with Strap  - 1 x daily - 7 x weekly - 2 sets - 20-30 hold - Seated Long Arc Quad  - 1 x  daily - 7 x weekly - 2 sets - 10 reps - 2 hold - Standing Hamstring Stretch on Chair  - 1 x daily - 7 x weekly - 1 sets - 3 reps - 30 sec hold - Quadricep Stretch with Chair and Counter Support  - 1 x daily - 7 x weekly - 1 sets - 3 reps - 30 sec hold - Seated Figure 4 Piriformis Stretch  - 1 x daily - 7 x weekly - 1 sets - 3 reps - 30 sed hold ASSESSMENT:  CLINICAL IMPRESSION: Terik presents to therapy verbalizing compliance with HEP. Today's treatment session focused on functional strengthening and hip mobility. He continues to demonstrate limitations in hamstring flexibility and overall hip  mobility. He still requires occasional verbal cues to increase step length when walking. Incorporated hurdles today to encourage this. He should continue to respond well to skilled therapy. Patient will benefit from skilled PT to address the below impairments and improve overall function.    OBJECTIVE IMPAIRMENTS: Abnormal gait, decreased balance, difficulty walking, decreased ROM, decreased strength, increased muscle spasms, impaired flexibility, postural dysfunction, and pain.   ACTIVITY LIMITATIONS: carrying, bending, dressing, and locomotion level  PARTICIPATION LIMITATIONS: cleaning, laundry, community activity, school, and camping/ hiking  PERSONAL FACTORS: Age, Fitness, Time since onset of injury/illness/exacerbation, and 3+ comorbidities: Left femur fx May 2024; Type 2 DB;  peripheral neuropathy; HTN; are also affecting patient's functional outcome.   REHAB POTENTIAL: Good  CLINICAL DECISION MAKING: Stable/uncomplicated  EVALUATION COMPLEXITY: Moderate   GOALS: Goals reviewed with patient? Yes  SHORT TERM GOALS: Target date: 09/26/2023  Patient will be independent with initial HEP. Baseline:  Goal status: In progress 10/02/23  2.  Patient will be able to walk 0.5 miles without left him pain or discomfort. Baseline: increased pain and has to stop Goal status: MET 10/02/23  3.  Patient will ambulate > or = to 1,000 feet during for improved community ambulation. Baseline: 858ft Goal status: In progress    LONG TERM GOALS: Target date: 10/24/2023  Patient will demonstrate independence in advanced HEP. Baseline:  Goal status: INITIAL  2.  Patient will be able to walk a mile with no left hip discomfort for improved cardiovascular endurance. Baseline: unable to walk 1 mile Goal status: INITIAL  3.  Patient will verbalize and demonstrate self-care strategies to manage pain including tissue mobility practices and change of position. Baseline:  Goal status:  INITIAL  4.  Patient will be able to bend and put on his socks and shoes with no left hip discomfort. Baseline: 2/10 pain Goal status: INITIAL  5.  Patient will perform TUG in < or = to 9 sec to decrease risk of falls. Baseline: 12.27sec Goal status: INITIAL  6.  Patient will score 48/80 on LEFS due to improved function of Lt LE. Baseline: 38/80  Goal status: INITIAL   PLAN:  PT FREQUENCY: 2x/week  PT DURATION: 8 weeks  PLANNED INTERVENTIONS: 97164- PT Re-evaluation, 97110-Therapeutic exercises, 97530- Therapeutic activity, 97112- Neuromuscular re-education, 97535- Self Care, 02859- Manual therapy, [phone]- Gait training, [phone]- Canalith repositioning, [id number]- Aquatic Therapy, [phone]- Electrical stimulation (unattended), [phone]- Electrical stimulation (manual), [id number]- Ionotophoresis 4mg /ml Dexamethasone , 79439 (1-2 muscles), 20561 (3+ muscles)- Dry Needling, Patient/Family education, Balance training, Stair training, Taping, Joint mobilization, Joint manipulation, Spinal manipulation, Spinal mobilization, Vestibular training, Cryotherapy, and Moist heat  PLAN FOR NEXT SESSION:  continue mobility; leg press; continue hip strengthening & balance training    Kristeen Sar, PT 10/09/23 11:07 AM [facility] [address] [place], [place] [address] Phone # [phone] Fax [phone]

## 2023-10-11 ENCOUNTER — Ambulatory Visit: Admitting: Physical Therapy

## 2023-10-11 ENCOUNTER — Encounter: Payer: Self-pay | Admitting: Physical Therapy

## 2023-10-11 ENCOUNTER — Encounter (HOSPITAL_COMMUNITY): Payer: Self-pay

## 2023-10-11 DIAGNOSIS — R2689 Other abnormalities of gait and mobility: Secondary | ICD-10-CM

## 2023-10-11 DIAGNOSIS — M6281 Muscle weakness (generalized): Secondary | ICD-10-CM

## 2023-10-11 DIAGNOSIS — M25552 Pain in left hip: Secondary | ICD-10-CM

## 2023-10-11 NOTE — Patient Instructions (Addendum)
 SURGICAL WAITING ROOM VISITATION Patients having surgery or a procedure may have no more than 2 support people in the waiting area - these visitors may rotate.    Children under the age of 52 must have an adult with them who is not the patient.  If the patient needs to stay at the hospital during part of their recovery, the visitor guidelines for inpatient rooms apply. Pre-op nurse will coordinate an appropriate time for 1 support person to accompany patient in pre-op.  This support person may not rotate.    Please refer to the [facility] website for the visitor guidelines for Inpatients (after your surgery is over and you are in a regular room).       Your procedure is scheduled on: 11-02-23   Report to [facility], The Main Entrance    Report to admitting at 10:30 AM   Call this number if you have problems the morning of surgery [phone]   Do not eat food or drink liquids :After Midnight.          If you have questions, please contact your surgeon's office.  FOLLOW  ANY ADDITIONAL PRE OP INSTRUCTIONS YOU RECEIVED FROM YOUR SURGEON'S OFFICE!!!     Oral Hygiene is also important to reduce your risk of infection.                                    Remember - BRUSH YOUR TEETH THE MORNING OF SURGERY WITH YOUR REGULAR TOOTHPASTE   Do NOT smoke after Midnight   Take these medicines the morning of surgery with A SIP OF WATER :    Atorvastatin    Duloxetine    Gabapentin    Pantoprazole    Solifenacin   Tamsulosin   Zyrtec   If needed Tylenol   Stop all vitamins and herbal supplements 7 days before surgery  How to Manage Your Diabetes Before and After Surgery  Why is it important to control my blood sugar before and after surgery? Improving blood sugar levels before and after surgery helps healing and can limit problems. A way of improving blood sugar control is eating a healthy diet by:  Eating less sugar and carbohydrates  Increasing activity/exercise  Talking with  your doctor about reaching your blood sugar goals High blood sugars (greater than 180 mg/dL) can raise your risk of infections and slow your recovery, so you will need to focus on controlling your diabetes during the weeks before surgery. Make sure that the doctor who takes care of your diabetes knows about your planned surgery including the date and location.  How do I manage my blood sugar before surgery? Check your blood sugar at least 4 times a day, starting 2 days before surgery, to make sure that the level is not too high or low. Check your blood sugar the morning of your surgery when you wake up and every 2 hours until you get to the Short Stay unit. If your blood sugar is less than 70 mg/dL, you will need to treat for low blood sugar: Do not take insulin . Treat a low blood sugar (less than 70 mg/dL) with  cup of clear juice (cranberry or apple), 4 glucose tablets, OR glucose gel. Recheck blood sugar in 15 minutes after treatment (to make sure it is greater than 70 mg/dL). If your blood sugar is not greater than 70 mg/dL on recheck, call [phone] for further instructions. Report your blood sugar to  the short stay nurse when you get to Short Stay.  If you are admitted to the hospital after surgery: Your blood sugar will be checked by the staff and you will probably be given insulin  after surgery (instead of oral diabetes medicines) to make sure you have good blood sugar levels. The goal for blood sugar control after surgery is 80-180 mg/dL.   WHAT DO I DO ABOUT MY DIABETES MEDICATION?  Do not take oral diabetes medicines (pills) the morning of surgery.(Do not take Glipizide  the morning of surgery)  THE NIGHT BEFORE SURGERY, take  21 units of insulin .      THE MORNING OF SURGERY, - do not take insulin .  DO NOT TAKE THE FOLLOWING 7 DAYS PRIOR TO SURGERY: Ozempic, Wegovy, Rybelsus (Semaglutide), Byetta (exenatide), Bydureon (exenatide ER), Victoza, Saxenda (liraglutide), or  Trulicity (dulaglutide) Mounjaro (Tirzepatide) Adlyxin (Lixisenatide), Polyethylene Glycol Loxenatide.  Reviewed and Endorsed by [facility] Patient Education Committee, August 2015                              You may not have any metal on your body including  jewelry, and body piercing             Do not wear  lotions, powders, cologne, or deodorant              Men may shave face and neck.   Do not bring valuables to the hospital. [facility] IS NOT RESPONSIBLE   FOR VALUABLES.   Contacts, dentures or bridgework may not be worn into surgery.  DO NOT BRING YOUR HOME MEDICATIONS TO THE HOSPITAL. PHARMACY WILL DISPENSE MEDICATIONS LISTED ON YOUR MEDICATION LIST TO YOU DURING YOUR ADMISSION IN THE HOSPITAL!    Patients discharged on the day of surgery will not be allowed to drive home.  Someone NEEDS to stay with you for the first 24 hours after anesthesia.   Special Instructions: Bring a copy of your healthcare power of attorney and living will documents the day of surgery if you haven't scanned them before.              Please read over the following fact sheets you were given: IF YOU HAVE QUESTIONS ABOUT YOUR PRE-OP INSTRUCTIONS PLEASE CALL [phone] [facility]  If you received a COVID test during your pre-op visit  it is requested that you wear a mask when out in public, stay away from anyone that may not be feeling well and notify your surgeon if you develop symptoms. If you test positive for Covid or have been in contact with anyone that has tested positive in the last 10 days please notify you surgeon.  [facility] - Preparing for Surgery Before surgery, you can play an important role.  Because skin is not sterile, your skin needs to be as free of germs as possible.  You can reduce the number of germs on your skin by washing with CHG (chlorahexidine gluconate) soap before surgery.  CHG is an antiseptic cleaner which kills germs and bonds with the skin to continue killing germs even after  washing. Please DO NOT use if you have an allergy to CHG or antibacterial soaps.  If your skin becomes reddened/irritated stop using the CHG and inform your nurse when you arrive at Short Stay. Do not shave (including legs and underarms) for at least 48 hours prior to the first CHG shower.  You may shave your face/neck.  Please follow  these instructions carefully:  1.  Shower with CHG Soap the night before surgery and the  morning of surgery.  2.  If you choose to wash your hair, wash your hair first as usual with your normal  shampoo.  3.  After you shampoo, rinse your hair and body thoroughly to remove the shampoo.                             4.  Use CHG as you would any other liquid soap.  You can apply chg directly to the skin and wash.  Gently with a scrungie or clean washcloth.  5.  Apply the CHG Soap to your body ONLY FROM THE NECK DOWN.   Do   not use on face/ open                           Wound or open sores. Avoid contact with eyes, ears mouth and   genitals (private parts).                       Wash face,  Genitals (private parts) with your normal soap.             6.  Wash thoroughly, paying special attention to the area where your    surgery  will be performed.  7.  Thoroughly rinse your body with warm water  from the neck down.  8.  DO NOT shower/wash with your normal soap after using and rinsing off the CHG Soap.                9.  Pat yourself dry with a clean towel.            10.  Wear clean pajamas.            11.  Place clean sheets on your bed the night of your first shower and do not  sleep with pets. Day of Surgery : Do not apply any lotions/deodorants the morning of surgery.  Please wear clean clothes to the hospital/surgery center.  FAILURE TO FOLLOW THESE INSTRUCTIONS MAY RESULT IN THE CANCELLATION OF YOUR SURGERY  PATIENT SIGNATURE_________________________________  NURSE  SIGNATURE__________________________________  ________________________________________________________________________

## 2023-10-11 NOTE — Progress Notes (Addendum)
  Date of COVID positive in last 90 days:  No  PCP - Norleen Jungling, MD Cardiologist - Newman Lawrence, MD  Chest x-ray - N/A EKG - 10-15-23 Epic Stress Test - 06-19-22 Epic ECHO - 05-10-22 Epic Cardiac Cath - Yes with stent placement Pacemaker/ICD device last checked:N/A Spinal Cord Stimulator:N/A  Bowel Prep - N/A  Sleep Study - N/A CPAP -   Freestyle Libre L arm Fasting Blood Sugar - 101 to 150 Checks Blood Sugar - multiple times a day  Last dose of GLP1 agonist-  N/A GLP1 instructions:  Do not take after     Last dose of SGLT-2 inhibitors-  N/A SGLT-2 instructions:  Do not take after    Blood Thinner Instructions:  Last dose:   Time: Aspirin  Instructions:  ASA 81.  Per patient to hold x5 days Last Dose:  Activity level:  Can go up a flight of stairs and perform activities of daily living without stopping and without symptoms of chest pain.  Patient states that he has some mild shortness of breath with exertion.  Able to exercise, walks daily but does have some shortness of breath, will rest for a few minutes and finish walk.    Anesthesia review: CAD, HTN, DM  Patient denies shortness of breath, fever, cough and chest pain at PAT appointment  Patient verbalized understanding of instructions that were given to them at the PAT appointment. Patient was also instructed that they will need to review over the PAT instructions again at home before surgery.

## 2023-10-11 NOTE — Therapy (Signed)
 OUTPATIENT PHYSICAL THERAPY LOWER EXTREMITY TREATMENT   Patient Name: Alejandro Foster MRN: [id number] DOB:[birth year], 89 y.o., male Today's Date: 10/11/2023  END OF SESSION:  PT End of Session - 10/11/23 1110     Visit Number 6    Date for PT Re-Evaluation 10/24/23    Authorization Type [employer] Medicare [employer] approved 16 visits 08/29/2023 - 10/24/2023 [id number]    Authorization Time Period 08/29/2023 - 10/24/2023    Authorization - Visit Number 6    Authorization - Number of Visits 16    Progress Note Due on Visit 10    PT Start Time 1017    PT Stop Time 1059    PT Time Calculation (min) 42 min    Activity Tolerance Patient tolerated treatment well    Behavior During Therapy WFL for tasks assessed/performed              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) 11/10/2019   Coronary artery disease    cardiologist-  dr blanca--  s/p  cardiac cath's w/ angioplasty and stenting x2   DDD (degenerative disc disease), lumbar    Diabetic peripheral neuropathy associated with type 2 diabetes mellitus (HCC) 12/12/2017   Dyslipidemia 01/25/2018   Dyspnea on exertion 08/22/2019   Foraminal stenosis of lumbar region 12/12/2017   History of kidney stones    Hyperlipidemia    Hypertension    Lumbar radiculopathy, right    right leg weakness   Myofascial pain syndrome 12/12/2017   Nephrolithiasis    bilateral non-obstructive per ct 01-19-2016   Peripheral neuropathy    feet   Posterior vitreous detachment of both eyes 11/10/2019   Pseudophakia of both eyes 11/10/2019   Right leg weakness    due to lumbar radiculopathy   Right ureteral stone    S/P coronary artery stent placement    2002 x1  and 2003 x1   Spondylosis without myelopathy or radiculopathy, lumbar region 12/12/2017    Trigger finger 05/28/2018   Type 2 diabetes mellitus (HCC)    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) 11/10/2019   Wears hearing aid    BILATERAL   Past Surgical History:  Procedure Laterality Date   BIOPSY  07/11/2022   Procedure: BIOPSY;  Surgeon: Saintclair Jasper, MD;  Location[facility] END[facility]COPY;  Service: Gastroenterology;;   BOTOX  INJECTION N/A 03/20/2023   Procedure: BOTOX  INJECTION;  Surgeon: Saintclair Jasper, MD;  Location: [facility];  Service: Gastroenterology;  Laterality: N/A;   CARDIAC CATHETERIZATION  01/ 2002   dr blanca   mLAD 30%,  CFX OM3 40%,  RCA 30% previous stent site   CARDIOVASCULAR STRESS TEST  04-15-2009  dr blanca   normal nuclear study w/ no ischemia/  normal LV function and wall motion , ef 77%   CATARACT EXTRACTION W/ INTRAOCULAR LENS  IMPLANT, BILATERAL  2013  approx.   COLONOSCOPY WITH PROPOFOL   2014   CORONARY ANGIOPLASTY  1990  dr blanca   PTCA to OM2   CORONARY ANGIOPLASTY  10/ 1996  dr blanca   PTCA to RCA   CORONARY ANGIOPLASTY WITH STENT PLACEMENT  04/ 1996  dr blanca   stenting to RCA   CYSTOSCOPY WITH URETEROSCOPY AND STENT PLACEMENT Right 03/28/2016  Procedure: CYSTOSCOPY WITH RIGHT  URETEROSCOPY AND STENT PLACEMENT;  Surgeon: Donnice Brooks, MD;  Location: [facility];  Service: Urology;  Laterality: Right;   ESOPHAGOGASTRODUODENOSCOPY (EGD) WITH PROPOFOL  N/A 07/11/2022   Procedure: ESOPHAGOGASTRODUODENOSCOPY (EGD) WITH PROPOFOL ;  Surgeon: Saintclair Jasper, MD;  Location: [facility] ENDOSCOPY;  Service: Gastroenterology;  Laterality: N/A;   ESOPHAGOGASTRODUODENOSCOPY (EGD) WITH PROPOFOL  N/A 03/20/2023   Procedure: ESOPHAGOGASTRODUODENOSCOPY (EGD) WITH PROPOFOL ;  Surgeon: Saintclair Jasper, MD;  Location: [facility] ENDOSCOPY;  Service: Gastroenterology;  Laterality: N/A;  with botox  injection s   EXTRACORPOREAL SHOCK WAVE LITHOTRIPSY  yrs ago   HOLMIUM LASER APPLICATION Right 03/28/2016   Procedure:  HOLMIUM LASER APPLICATION;  Surgeon: Donnice Brooks, MD;  Location: [facility];  Service: Urology;  Laterality: Right;   INTRAMEDULLARY (IM) NAIL INTERTROCHANTERIC Left 07/07/2022   Procedure: INTRAMEDULLARY (IM) NAILING OF LEFT FEMUR;  Surgeon: Jerri Kay HERO, MD;  Location: [facility] OR;  Service: Orthopedics;  Laterality: Left;   LAPAROSCOPIC CHOLECYSTECTOMY  1990's   SUBMUCOSAL INJECTION  07/11/2022   Procedure: SUBMUCOSAL INJECTION;  Surgeon: Saintclair Jasper, MD;  Location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) 05/12/2022   Paving stone retinal degeneration, bilateral 11/03/2021   Senile purpura (HCC) 09/07/2020   Claudication (HCC) 03/17/2020   Wears hearing aid    Type 2 diabetes mellitus (HCC)    S/P coronary artery stent placement    Right ureteral stone    Right leg weakness    Lumbar radiculopathy, right    History of kidney stones    DDD (degenerative disc disease), lumbar    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) 11/10/2019   Pseudophakia of both eyes 11/10/2019   Posterior vitreous detachment of both eyes 11/10/2019   Amnesia 09/19/2019   Dyspnea on exertion 08/22/2019   Atypical chest pain 08/22/2019   Diabetic retinopathy associated with type 2 diabetes mellitus (HCC) 03/27/2019   Trigger finger 05/28/2018   Dyslipidemia 01/25/2018   Diabetic peripheral neuropathy associated with type 2 diabetes mellitus (HCC) 12/12/2017   Chronic bilateral thoracic back pain 12/12/2017   Myofascial pain syndrome 12/12/2017    Foraminal stenosis of lumbar region 12/12/2017   Spondylosis without myelopathy or radiculopathy, lumbar region 12/12/2017   Cough 09/17/2017   Irritability and anger 09/17/2017   Localized edema 08/01/2017   Abnormal gait 01/09/2017   Fatigue 09/12/2016   Hypertensive heart disease without congestive heart failure 09/03/2014   Long term (current) use of insulin  (HCC) 09/03/2014   Ventricular premature depolarization 09/03/2014   Encounter for general adult medical examination without abnormal findings 08/26/2014   Gout 08/28/2013   Impacted cerumen 08/28/2013   Hypo-osmolality and hyponatremia 08/28/2013   Urge incontinence of urine 08/26/2012   Age-related osteoporosis without current pathological fracture 02/19/2012   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   Vitamin D deficiency 02/19/2012   Underimmunization status 12/26/2011   Backache 08/21/2011   Hearing loss 05/23/2011   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) 03/22/2010   Kidney stone 05/25/2009   ED (erectile dysfunction) of organic origin 05/25/2009   Obesity 05/25/2009  Metabolic syndrome 05/25/2009   Polyneuropathy 12/25/2008   Hyperglycemia due to type 2 diabetes mellitus (HCC) 12/25/2008   Gastro-esophageal reflux disease without esophagitis 12/25/2008   Insomnia 12/25/2008   Dilatation of aorta (HCC) 10/02/2008   Essential hypertension 10/02/2008   Hyperlipidemia 10/02/2008    PCP: Onita Rush, MD   REFERRING PROVIDER: Jerri Kay HERO, MD  REFERRING DIAG: [phone] (ICD-10-CM) - Displaced intertrochanteric fracture of left femur, initial encounter for closed fracture [facility])  THERAPY DIAG:  Pain in left hip  Muscle weakness (generalized)  Other abnormalities of gait and mobility  Rationale for Evaluation and Treatment: Rehabilitation  ONSET DATE: Jul 19 2022  SUBJECTIVE:   SUBJECTIVE STATEMENT: Patient reports he is doing good today. He felt good after last treatment  session. No pain currently.  From Eval: Patient presents with left hip pain that began when he broke his femur Jul 19 2022.  He was in the hospital and did PT at [facility]. He has been traveling and he has gain some weight and he feels he is limping more. Patient reports he had imaging done today and every has healed fine there is just some inflammation. It has been challenging to putting on his shoes, bending, and he feels his balance is off. Previously he was able to walk a mile and now he is limited to 0.5 miles.   PERTINENT HISTORY: Left femur fx May 2024; Type 2 DB; lumbar DDD; peripheral neuropathy; HTN; PAIN:  10/02/23 Are you having pain? Yes: NPRS scale: 2-3/ 10 Pain location: lateral femur; where his scares or located Pain description: constant soreness; achy Aggravating factors: bending, walking Relieving factors: Tylenol    PRECAUTIONS: None  RED FLAGS: None   WEIGHT BEARING RESTRICTIONS: No  FALLS:  Has patient fallen in last 6 months? Yes. Number of falls 1 he fell in the hospital walking with his walker  LIVING ENVIRONMENT: Lives with: lives with their spouse Lives in: House/apartment Stairs: Yes: Internal: 12 steps; on left going up Has following equipment at home: Walking cane  OCCUPATION: Retired Nutritional therapist  PLOF: Independent, Independent with basic ADLs, Independent with household mobility without device, Independent with community mobility without device, Independent with gait, and Independent with transfers  PATIENT GOALS: To be able to go camping again and be about to walk without a limp  NEXT MD VISIT: PRN  OBJECTIVE:  Note: Objective measures were completed at Evaluation unless otherwise noted.  DIAGNOSTIC FINDINGS: No final results yet  PATIENT SURVEYS:  LEFS: 38/80 47.5%  COGNITION: Overall cognitive status: Within functional limits for tasks assessed     SENSATION: WFL   MUSCLE LENGTH: Hamstrings: limited bilateral     POSTURE: rounded shoulders and forward head  PALPATION: Tenderness to palpation Lt TFL, glutes, and quads  LOWER EXTREMITY ROM: WFL; limited Lt hip IR/ER   LOWER EXTREMITY MMT:  MMT Right eval Left eval  Hip flexion 4+ 4-*  Hip extension    Hip abduction 4+ 4-  Hip adduction    Hip internal rotation    Hip external rotation    Knee flexion 4+ 4+  Knee extension 4+ 4shaky  Ankle dorsiflexion    Ankle plantarflexion    Ankle inversion    Ankle eversion     (Blank rows = not tested)    FUNCTIONAL TESTS:  5 times sit to stand: 10.67 sec no UE support Timed up and go (TUG): 12.27 6 minute walk test: 892FT ; groin pain after 5 mins;  GAIT: Distance  walked: 892 FT Assistive device utilized: None Level of assistance: Complete Independence Comments: decreased step length on Rt; decreased stance time on Lt ; antaglic gait                                                                                                                                TREATMENT DATE:  10/11/2023 NuStep Level 6 6 mins- PT present to discuss status Standing hamstring stretch 3 x 30 sec bilateral  Standing quad/hip flexor stretch 2 x 30 sec bilateral  Seated piriformis stretch 2 x 30 sec bilateral  Green TB around ankles (lateral step & backwards step) 2 x 10 bilateral  6in step ups holding two 8# DB x 12 bilateral (SBA)- forwards and lateral Sit to stand + chest press holding 8# DB 2 x 10 Hurdles (forwards & lateral) step over pattern x 2 laps each Single leg cone tone (cone infront and lateral) x 8 taps each leg Bilateral farmer's carry (around both gyms) 8# x 1laps 8#    10/09/2023 NuStep Level 6 6 mins- PT present to discuss status Standing hamstring stretch 3 x 30 sec bilateral  Standing quad/hip flexor stretch 2 x 30 sec bilateral  Side stepping with green TB around ankles x 3 laps in // bars 6 in step ups at barre x 12 bilateral  6in lateral step ups x 12 bilateral  Hip matrix  hip abduction and extension both LE's with 40 lbs 2 x 10 each Sit to stand with 7# DB 2x 0 Unilateral farmer's carry (down long hallway and back) 7# x 2 laps switching hands     10/04/2023 NuStep Level 5 5 mins- PT present to discuss status Standing hamstring stretch 2 x 30 sec bilateral  Standing quad/hip flexor stretch 2 x 30 sec bilateral  Seated piriformis stretch 2 x 30 sec bilateral (using green strap on Lt ) Side lunge stretch for adductors 3 x 10 sec Lateral band walks with blue loop 3 laps of 10 feet 6 in step ups at barre x 12 bilateral  6in lateral step ups x 12 bilateral  Sit to stand  5# DB hold 2 x 10 Hip matrix hip abduction and extension both LE's with 40 lbs 2 x 10 each Seated LAQ 3# AW 2 x 10 bilateral  Seated clam with red TB 2 x 10 Unilateral farmer's carry (down long hallway and back) 7# x 2 laps switching hands    PATIENT EDUCATION:  Education details: Ice; POC; examination findings; HEP Person educated: Patient Education method: Explanation, Demonstration, and Handouts Education comprehension: verbalized understanding, returned demonstration, and needs further education  HOME EXERCISE PROGRAM: Access Code: [email or web address] URL: [email or web address] Date: 10/11/2023 Prepared by: Kristeen Sar  Exercises - Supine Gluteus Stretch  - 1 x daily - 7 x weekly - 2 sets - 20-30 hold - Supine Hamstring Stretch with Strap  - 1 x daily - 7 x weekly - 2 sets -  20-30 hold - Supine ITB Stretch with Strap  - 1 x daily - 7 x weekly - 2 sets - 20-30 hold - Seated Long Arc Quad  - 1 x daily - 7 x weekly - 2 sets - 10 reps - 2 hold - Standing Hamstring Stretch on Chair  - 1 x daily - 7 x weekly - 1 sets - 3 reps - 30 sec hold - Quadricep Stretch with Chair and Counter Support  - 1 x daily - 7 x weekly - 1 sets - 3 reps - 30 sec hold - Seated Figure 4 Piriformis Stretch  - 1 x daily - 7 x weekly - 1 sets - 3 reps - 30 sed hold - Standing 3-Way Leg Reach with  Resistance at Ankles and Unilateral Counter Support  - 1 x daily - 7 x weekly - 2 sets - 10 reps ASSESSMENT:  CLINICAL IMPRESSION: Izrael is progressing appropriately with physical therapy. He has been walking more often with his dogs. With step up exercises added bilateral 8# hand hold today. This was a good challenge for patient's balance. When ascending with left he caught foot three times but was able to self recover balance. PT was guarding patient while performing exercise. Patient is tolerating current level on exercise intensity well. Patient will benefit from skilled PT to address the below impairments and improve overall function.     OBJECTIVE IMPAIRMENTS: Abnormal gait, decreased balance, difficulty walking, decreased ROM, decreased strength, increased muscle spasms, impaired flexibility, postural dysfunction, and pain.   ACTIVITY LIMITATIONS: carrying, bending, dressing, and locomotion level  PARTICIPATION LIMITATIONS: cleaning, laundry, community activity, school, and camping/ hiking  PERSONAL FACTORS: Age, Fitness, Time since onset of injury/illness/exacerbation, and 3+ comorbidities: Left femur fx May 2024; Type 2 DB;  peripheral neuropathy; HTN; are also affecting patient's functional outcome.   REHAB POTENTIAL: Good  CLINICAL DECISION MAKING: Stable/uncomplicated  EVALUATION COMPLEXITY: Moderate   GOALS: Goals reviewed with patient? Yes  SHORT TERM GOALS: Target date: 09/26/2023  Patient will be independent with initial HEP. Baseline:  Goal status: In progress 10/02/23  2.  Patient will be able to walk 0.5 miles without left him pain or discomfort. Baseline: increased pain and has to stop Goal status: MET 10/02/23  3.  Patient will ambulate > or = to 1,000 feet during for improved community ambulation. Baseline: 835ft Goal status: In progress    LONG TERM GOALS: Target date: 10/24/2023  Patient will demonstrate independence in advanced HEP. Baseline:   Goal status: INITIAL  2.  Patient will be able to walk a mile with no left hip discomfort for improved cardiovascular endurance. Baseline: unable to walk 1 mile Goal status: INITIAL  3.  Patient will verbalize and demonstrate self-care strategies to manage pain including tissue mobility practices and change of position. Baseline:  Goal status: INITIAL  4.  Patient will be able to bend and put on his socks and shoes with no left hip discomfort. Baseline: 2/10 pain Goal status: INITIAL  5.  Patient will perform TUG in < or = to 9 sec to decrease risk of falls. Baseline: 12.27sec Goal status: INITIAL  6.  Patient will score 48/80 on LEFS due to improved function of Lt LE. Baseline: 38/80  Goal status: INITIAL   PLAN:  PT FREQUENCY: 2x/week  PT DURATION: 8 weeks  PLANNED INTERVENTIONS: [id number]- PT Re-evaluation, 97110-Therapeutic exercises, 97530- Therapeutic activity, [id number]- Neuromuscular re-education, 97535- Self Care, 02859- Manual therapy, [id number]- Gait training, [phone]- Canalith repositioning, [id number]-  Aquatic Therapy, [phone]- Electrical stimulation (unattended), [id number]- Electrical stimulation (manual), 02966- Ionotophoresis 4mg /ml Dexamethasone , 20560 (1-2 muscles), 20561 (3+ muscles)- Dry Needling, Patient/Family education, Balance training, Stair training, Taping, Joint mobilization, Joint manipulation, Spinal manipulation, Spinal mobilization, Vestibular training, Cryotherapy, and Moist heat  PLAN FOR NEXT SESSION: assess tolerance to treatment session; continue mobility; leg press; continue hip strengthening & balance training    Kristeen Sar, PT 10/11/23 11:12 AM [facility] [address] [place], [place] [address] Phone # [phone] Fax [phone]

## 2023-10-15 ENCOUNTER — Other Ambulatory Visit: Payer: Self-pay

## 2023-10-15 ENCOUNTER — Encounter (HOSPITAL_COMMUNITY): Payer: Self-pay

## 2023-10-15 ENCOUNTER — Encounter (HOSPITAL_COMMUNITY)
Admission: RE | Admit: 2023-10-15 | Discharge: 2023-10-15 | Disposition: A | Discharge status: Home / Self Care | Source: Ambulatory Visit | Attending: Urology | Admitting: Urology

## 2023-10-15 ENCOUNTER — Encounter (HOSPITAL_COMMUNITY): Payer: Self-pay | Admitting: Medical

## 2023-10-15 VITALS — BP 139/69 | HR 64 | Temp 98.5°F | Resp 16 | Ht 67.0 in | Wt 185.4 lb

## 2023-10-15 DIAGNOSIS — Z794 Long term (current) use of insulin: Secondary | ICD-10-CM | POA: Diagnosis not present

## 2023-10-15 DIAGNOSIS — D649 Anemia, unspecified: Secondary | ICD-10-CM | POA: Diagnosis not present

## 2023-10-15 DIAGNOSIS — Z01818 Encounter for other preprocedural examination: Secondary | ICD-10-CM | POA: Diagnosis present

## 2023-10-15 DIAGNOSIS — E119 Type 2 diabetes mellitus without complications: Secondary | ICD-10-CM | POA: Diagnosis not present

## 2023-10-15 HISTORY — DX: Gastro-esophageal reflux disease without esophagitis: K21.9

## 2023-10-15 HISTORY — DX: Anemia, unspecified: D64.9

## 2023-10-15 HISTORY — DX: Depression, unspecified: F32.A

## 2023-10-15 HISTORY — DX: Malignant neoplasm of bladder, unspecified: C67.9

## 2023-10-15 HISTORY — DX: Unspecified osteoarthritis, unspecified site: M19.90

## 2023-10-15 LAB — BASIC METABOLIC PANEL WITH GFR
Anion gap: 13 (ref 5–15)
BUN: 19 mg/dL (ref 8–23)
CO2: 20 mmol/L — ABNORMAL LOW (ref 22–32)
Calcium: 9.4 mg/dL (ref 8.9–10.3)
Chloride: 105 mmol/L (ref 98–111)
Creatinine, Ser: 0.91 mg/dL (ref 0.61–1.24)
GFR, Estimated: >60 mL/min (ref >60)
Glucose, Bld: 156 mg/dL — ABNORMAL HIGH (ref 70–99)
Potassium: 4.1 mmol/L (ref 3.5–5.1)
Sodium: 138 mmol/L (ref 135–145)

## 2023-10-15 LAB — CBC
HCT: 42.9 % (ref 39.0–52.0)
Hemoglobin: 13.6 g/dL (ref 13.0–17.0)
MCH: 28.9 pg (ref 26.0–34.0)
MCHC: 31.7 g/dL (ref 30.0–36.0)
MCV: 91.3 fL (ref 80.0–100.0)
Platelets: 252 K/uL (ref 150–400)
RBC: 4.7 MIL/uL (ref 4.22–5.81)
RDW: 13.5 % (ref 11.5–15.5)
WBC: 6.4 K/uL (ref 4.0–10.5)
nRBC: 0 % (ref 0.0–0.2)

## 2023-10-15 LAB — GLUCOSE, CAPILLARY: Glucose-Capillary: 159 mg/dL — ABNORMAL HIGH (ref 70–99)

## 2023-10-15 LAB — HEMOGLOBIN A1C
Hgb A1c MFr Bld: 7.6 % — ABNORMAL HIGH (ref 4.8–5.6)
Mean Plasma Glucose: 171.42 mg/dL

## 2023-10-16 ENCOUNTER — Encounter: Payer: Self-pay | Admitting: Physical Therapy

## 2023-10-16 ENCOUNTER — Ambulatory Visit: Admitting: Physical Therapy

## 2023-10-16 DIAGNOSIS — M25552 Pain in left hip: Secondary | ICD-10-CM

## 2023-10-16 DIAGNOSIS — R2689 Other abnormalities of gait and mobility: Secondary | ICD-10-CM

## 2023-10-16 DIAGNOSIS — M6281 Muscle weakness (generalized): Secondary | ICD-10-CM

## 2023-10-16 NOTE — Therapy (Signed)
 OUTPATIENT PHYSICAL THERAPY LOWER EXTREMITY TREATMENT   Patient Name: Alejandro Foster MRN: [id number] DOB:1933-06-21, 89 y.o., male Today's Date: 10/16/2023  END OF SESSION:  PT End of Session - 10/16/23 1317     Visit Number 7    Date for PT Re-Evaluation 10/24/23    Authorization Type [employer] Medicare [employer] approved 16 visits 08/29/2023 - 10/24/2023 [id number]    Authorization Time Period 08/29/2023 - 10/24/2023    Authorization - Visit Number 7    Authorization - Number of Visits 16    Progress Note Due on Visit 10    PT Start Time 1230    PT Stop Time 1312    PT Time Calculation (min) 42 min    Activity Tolerance Patient tolerated treatment well    Behavior During Therapy WFL for tasks assessed/performed               Past Medical History:  Diagnosis Date   Anemia    Arthritis    Hands   Bladder cancer (HCC)    Chronic bilateral thoracic back pain 12/12/2017   Complication of anesthesia    Hard time waking up in the past   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) 11/10/2019   Coronary artery disease    cardiologist-  dr blanca--  s/p  cardiac cath's w/ angioplasty and stenting x2   DDD (degenerative disc disease), lumbar    Depression    Diabetic peripheral neuropathy associated with type 2 diabetes mellitus (HCC) 12/12/2017   Dyslipidemia 01/25/2018   Dyspnea on exertion 08/22/2019   Foraminal stenosis of lumbar region 12/12/2017   GERD (gastroesophageal reflux disease)    History of kidney stones    Hyperlipidemia    Hypertension    Lumbar radiculopathy, right    right leg weakness   Myofascial pain syndrome 12/12/2017   Nephrolithiasis    bilateral non-obstructive per ct 01-19-2016   Peripheral neuropathy    feet   Posterior vitreous detachment of both eyes 11/10/2019   Pseudophakia of both eyes 11/10/2019   Right leg weakness    due to lumbar radiculopathy   Right ureteral stone    S/P coronary artery  stent placement    2002 x1  and 2003 x1   Spondylosis without myelopathy or radiculopathy, lumbar region 12/12/2017   Trigger finger 05/28/2018   Type 2 diabetes mellitus (HCC)    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) 11/10/2019   Wears hearing aid    BILATERAL   Past Surgical History:  Procedure Laterality Date   BIOPSY  07/11/2022   Procedure: BIOPSY;  Surgeon: Saintclair Jasper, MD;  Location: [facility];  Service: Gastroenterology;;   BOTOX  INJECTION N/A 03/20/2023   Procedure: BOTOX  INJECTION;  Surgeon: Saintclair Jasper, MD;  Location: [facility];  Service: Gastroenterology;  Laterality: N/A;   CARDIAC CATHETERIZATION  01/ 2002   dr blanca   mLAD 30%,  CFX OM3 40%,  RCA 30% previous stent site   CARDIOVASCULAR STRESS TEST  04-15-2009  dr blanca   normal nuclear study w/ no ischemia/  normal LV function and wall motion , ef 77%   CATARACT EXTRACTION W/ INTRAOCULAR LENS  IMPLANT, BILATERAL  2013  approx.   COLONOSCOPY WITH PROPOFOL   2014   CORONARY ANGIOPLASTY  1990  dr blanca   PTCA to OM2   CORONARY ANGIOPLASTY  10/ 1996  dr blanca   PTCA to RCA  CORONARY ANGIOPLASTY WITH STENT PLACEMENT  04/ 1996  dr blanca   stenting to RCA   CYSTOSCOPY WITH URETEROSCOPY AND STENT PLACEMENT Right 03/28/2016   Procedure: CYSTOSCOPY WITH RIGHT  URETEROSCOPY AND STENT PLACEMENT;  Surgeon: Donnice Brooks, MD;  Location: [facility];  Service: Urology;  Laterality: Right;   ESOPHAGOGASTRODUODENOSCOPY (EGD) WITH PROPOFOL  N/A 07/11/2022   Procedure: ESOPHAGOGASTRODUODENOSCOPY (EGD) WITH PROPOFOL ;  Surgeon: Saintclair Jasper, MD;  Location: [facility]COPY;  Service: Gastroenterology;  Laterality: N/A;   ESOPHAGOGASTRODUODENOSCOPY (EGD) WITH PROPOFOL  N/A 03/20/2023   Procedure: ESOPHAGOGASTRODUODENOSCOPY (EGD) WITH PROPOFOL ;  Surgeon: Saintclair Jasper, MD;  Location: [facility]COPY;  Service: Gastroenterology;  Laterality: N/A;  with botox   injection s   EXTRACORPOREAL SHOCK WAVE LITHOTRIPSY  yrs ago   HOLMIUM LASER APPLICATION Right 03/28/2016   Procedure: HOLMIUM LASER APPLICATION;  Surgeon: Donnice Brooks, MD;  Location: [facility];  Service: Urology;  Laterality: Right;   INTRAMEDULLARY (IM) NAIL INTERTROCHANTERIC Left 07/07/2022   Procedure: INTRAMEDULLARY (IM) NAILING OF LEFT FEMUR;  Surgeon: Jerri Kay HERO, MD;  Location: [facility] OR;  Service: Orthopedics;  Laterality: Left;   LAPAROSCOPIC CHOLECYSTECTOMY  1990's   SUBMUCOSAL INJECTION  07/11/2022   Procedure: SUBMUCOSAL INJECTION;  Surgeon: Saintclair Jasper, MD;  Location: [facility] ENDOSCOPY;  Service: Gastroenterology;;   TONSILLECTOMY AND ADENOIDECTOMY  child   URETEROLITHOTOMY  1990's   Patient Active Problem List   Diagnosis Date Noted   Fall 07/06/2022   Displaced intertrochanteric fracture of left femur, initial encounter for closed fracture (HCC) 07/06/2022   CAD (coronary artery disease) 07/06/2022   Coronary artery disease 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) 05/12/2022   Paving stone retinal degeneration, bilateral 11/03/2021   Senile purpura (HCC) 09/07/2020   Claudication (HCC) 03/17/2020   Wears hearing aid    Type 2 diabetes mellitus (HCC)    S/P coronary artery stent placement    Right ureteral stone    Right leg weakness    Lumbar radiculopathy, right    History of kidney stones    DDD (degenerative disc disease), lumbar    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) 11/10/2019   Pseudophakia of both eyes 11/10/2019   Posterior vitreous detachment of both eyes 11/10/2019   Amnesia 09/19/2019   Dyspnea on exertion 08/22/2019   Atypical chest pain 08/22/2019   Diabetic retinopathy associated with type 2 diabetes mellitus (HCC) 03/27/2019   Trigger finger 05/28/2018   Dyslipidemia 01/25/2018   Diabetic peripheral neuropathy associated with type 2 diabetes  mellitus (HCC) 12/12/2017   Chronic bilateral thoracic back pain 12/12/2017   Myofascial pain syndrome 12/12/2017   Foraminal stenosis of lumbar region 12/12/2017   Spondylosis without myelopathy or radiculopathy, lumbar region 12/12/2017   Cough 09/17/2017   Irritability and anger 09/17/2017   Localized edema 08/01/2017   Abnormal gait 01/09/2017   Fatigue 09/12/2016   Hypertensive heart disease without congestive heart failure 09/03/2014   Long term (current) use of insulin  (HCC) 09/03/2014   Ventricular premature depolarization 09/03/2014   Encounter for general adult medical examination without abnormal findings 08/26/2014   Gout 08/28/2013   Impacted cerumen 08/28/2013   Hypo-osmolality and hyponatremia 08/28/2013   Urge incontinence of urine 08/26/2012   Age-related osteoporosis without current pathological fracture 02/19/2012   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   Vitamin D deficiency 02/19/2012   Underimmunization status 12/26/2011   Backache 08/21/2011   Hearing loss 05/23/2011   Type 2  diabetes mellitus with other diabetic neurological complication (HCC) 03/22/2010   Kidney stone 05/25/2009   ED (erectile dysfunction) of organic origin 05/25/2009   Obesity 05/25/2009   Metabolic syndrome 05/25/2009   Polyneuropathy 12/25/2008   Hyperglycemia due to type 2 diabetes mellitus (HCC) 12/25/2008   Gastro-esophageal reflux disease without esophagitis 12/25/2008   Insomnia 12/25/2008   Dilatation of aorta (HCC) 10/02/2008   Essential hypertension 10/02/2008   Hyperlipidemia 10/02/2008    PCP: Onita Rush, MD   REFERRING PROVIDER: Jerri Kay HERO, MD  REFERRING DIAG: [phone] (ICD-10-CM) - Displaced intertrochanteric fracture of left femur, initial encounter for closed fracture [facility])  THERAPY DIAG:  Pain in left hip  Muscle weakness (generalized)  Other abnormalities of gait and mobility  Rationale for Evaluation and Treatment: Rehabilitation  ONSET DATE: Jul 19 2022  SUBJECTIVE:   SUBJECTIVE STATEMENT: Patient reports he is doing good today. He is not currently having any pain. He felt good after last treatment session.  From Eval: Patient presents with left hip pain that began when he broke his femur Jul 19 2022.  He was in the hospital and did PT at [facility]. He has been traveling and he has gain some weight and he feels he is limping more. Patient reports he had imaging done today and every has healed fine there is just some inflammation. It has been challenging to putting on his shoes, bending, and he feels his balance is off. Previously he was able to walk a mile and now he is limited to 0.5 miles.   PERTINENT HISTORY: Left femur fx May 2024; Type 2 DB; lumbar DDD; peripheral neuropathy; HTN; PAIN:  10/02/23 Are you having pain? Yes: NPRS scale: 2-3/ 10 Pain location: lateral femur; where his scares or located Pain description: constant soreness; achy Aggravating factors: bending, walking Relieving factors: Tylenol    PRECAUTIONS: None  RED FLAGS: None   WEIGHT BEARING RESTRICTIONS: No  FALLS:  Has patient fallen in last 6 months? Yes. Number of falls 1 he fell in the hospital walking with his walker  LIVING ENVIRONMENT: Lives with: lives with their spouse Lives in: House/apartment Stairs: Yes: Internal: 12 steps; on left going up Has following equipment at home: Walking cane  OCCUPATION: Retired Nutritional therapist  PLOF: Independent, Independent with basic ADLs, Independent with household mobility without device, Independent with community mobility without device, Independent with gait, and Independent with transfers  PATIENT GOALS: To be able to go camping again and be about to walk without a limp  NEXT MD VISIT: PRN  OBJECTIVE:  Note: Objective measures were completed at Evaluation unless otherwise noted.  DIAGNOSTIC FINDINGS: No final results yet  PATIENT SURVEYS:  LEFS: 38/80 47.5%  COGNITION: Overall  cognitive status: Within functional limits for tasks assessed     SENSATION: WFL   MUSCLE LENGTH: Hamstrings: limited bilateral    POSTURE: rounded shoulders and forward head  PALPATION: Tenderness to palpation Lt TFL, glutes, and quads  LOWER EXTREMITY ROM: WFL; limited Lt hip IR/ER   LOWER EXTREMITY MMT:  MMT Right eval Left eval  Hip flexion 4+ 4-*  Hip extension    Hip abduction 4+ 4-  Hip adduction    Hip internal rotation    Hip external rotation    Knee flexion 4+ 4+  Knee extension 4+ 4shaky  Ankle dorsiflexion    Ankle plantarflexion    Ankle inversion    Ankle eversion     (Blank rows = not tested)  FUNCTIONAL TESTS:  5 times sit to stand: 10.67 sec no UE support Timed up and go (TUG): 12.27 6 minute walk test: 892FT ; groin pain after 5 mins;  GAIT: Distance walked: 892 FT Assistive device utilized: None Level of assistance: Complete Independence Comments: decreased step length on Rt; decreased stance time on Lt ; antaglic gait                                                                                                                                TREATMENT DATE:  10/16/2023 NuStep Level 6 5 mins- PT present to discuss status Green TB around ankles (lateral step & backwards step) 2 x 10 bilateral  Side stepping with green TB around ankles at barre x 4 laps 6in step ups holding two 8# DB x 12 bilateral (SBA)- forwards and lateral Single leg on airex + cone tap (cone forwards & lateral) Single leg cone tap (cone infront and lateral) standing on airex x 8 taps each leg min- mod assist at barre Discussion & demonstration of correct technique when lifting objects from floor, safe transfer from ground Single leg lunge holding 8# DB x 10 bilateral  Leg Press 95# bilateral 2 x 10; 45# unilateral (Lt ); 55# (Rt) x 20 Hurdles (forwards & lateral) step over pattern x 2 laps each no UE support Bilateral farmer's carry (around both gyms) 8# x 1laps      10/11/2023 NuStep Level 6 6 mins- PT present to discuss status Standing hamstring stretch 3 x 30 sec bilateral  Standing quad/hip flexor stretch 2 x 30 sec bilateral  Seated piriformis stretch 2 x 30 sec bilateral  Green TB around ankles (lateral step & backwards step) 2 x 10 bilateral  6in step ups holding two 8# DB x 12 bilateral (SBA)- forwards and lateral Sit to stand + chest press holding 8# DB 2 x 10 Hurdles (forwards & lateral) step over pattern x 2 laps each Single leg cone tap (cone infront and lateral) x 8 taps each leg Bilateral farmer's carry (around both gyms) 8# x 1laps 8#    10/09/2023 NuStep Level 6 6 mins- PT present to discuss status Standing hamstring stretch 3 x 30 sec bilateral  Standing quad/hip flexor stretch 2 x 30 sec bilateral  Side stepping with green TB around ankles x 3 laps in // bars 6 in step ups at barre x 12 bilateral  6in lateral step ups x 12 bilateral  Hip matrix hip abduction and extension both LE's with 40 lbs 2 x 10 each Sit to stand with 7# DB 2x 0 Unilateral farmer's carry (down long hallway and back) 7# x 2 laps switching hands    PATIENT EDUCATION:  Education details: Ice; POC; examination findings; HEP Person educated: Patient Education method: Explanation, Demonstration, and Handouts Education comprehension: verbalized understanding, returned demonstration, and needs further education  HOME EXERCISE PROGRAM: Access Code: [email or web address] URL: [email or web address] Date: 10/11/2023 Prepared by: Westley Blass  Barney Gertsch  Exercises - Supine Gluteus Stretch  - 1 x daily - 7 x weekly - 2 sets - 20-30 hold - Supine Hamstring Stretch with Strap  - 1 x daily - 7 x weekly - 2 sets - 20-30 hold - Supine ITB Stretch with Strap  - 1 x daily - 7 x weekly - 2 sets - 20-30 hold - Seated Long Arc Quad  - 1 x daily - 7 x weekly - 2 sets - 10 reps - 2 hold - Standing Hamstring Stretch on Chair  - 1 x daily - 7 x weekly - 1 sets - 3 reps - 30 sec  hold - Quadricep Stretch with Chair and Counter Support  - 1 x daily - 7 x weekly - 1 sets - 3 reps - 30 sec hold - Seated Figure 4 Piriformis Stretch  - 1 x daily - 7 x weekly - 1 sets - 3 reps - 30 sed hold - Standing 3-Way Leg Reach with Resistance at Ankles and Unilateral Counter Support  - 1 x daily - 7 x weekly - 2 sets - 10 reps ASSESSMENT:  CLINICAL IMPRESSION: Biff continues to verbalize no to low hip pain levels. He verbalized the most challenging activity for him is bending to put on his shoes and standing up after kneeling when hooking up his trailer. Demonstrated lifting technique with a wide BOS for improved stability. Incorporated more single leg balance activities for improved strength and mobility. Overall, patient tolerated treatment session well. Patient should continue to respond well to physical therapy.     OBJECTIVE IMPAIRMENTS: Abnormal gait, decreased balance, difficulty walking, decreased ROM, decreased strength, increased muscle spasms, impaired flexibility, postural dysfunction, and pain.   ACTIVITY LIMITATIONS: carrying, bending, dressing, and locomotion level  PARTICIPATION LIMITATIONS: cleaning, laundry, community activity, school, and camping/ hiking  PERSONAL FACTORS: Age, Fitness, Time since onset of injury/illness/exacerbation, and 3+ comorbidities: Left femur fx May 2024; Type 2 DB;  peripheral neuropathy; HTN; are also affecting patient's functional outcome.   REHAB POTENTIAL: Good  CLINICAL DECISION MAKING: Stable/uncomplicated  EVALUATION COMPLEXITY: Moderate   GOALS: Goals reviewed with patient? Yes  SHORT TERM GOALS: Target date: 09/26/2023  Patient will be independent with initial HEP. Baseline:  Goal status: In progress 10/02/23  2.  Patient will be able to walk 0.5 miles without left him pain or discomfort. Baseline: increased pain and has to stop Goal status: MET 10/02/23  3.  Patient will ambulate > or = to 1,000 feet during for  improved community ambulation. Baseline: 855ft Goal status: In progress    LONG TERM GOALS: Target date: 10/24/2023  Patient will demonstrate independence in advanced HEP. Baseline:  Goal status: INITIAL  2.  Patient will be able to walk a mile with no left hip discomfort for improved cardiovascular endurance. Baseline: unable to walk 1 mile Goal status: INITIAL  3.  Patient will verbalize and demonstrate self-care strategies to manage pain including tissue mobility practices and change of position. Baseline:  Goal status: INITIAL  4.  Patient will be able to bend and put on his socks and shoes with no left hip discomfort. Baseline: 2/10 pain Goal status: INITIAL  5.  Patient will perform TUG in < or = to 9 sec to decrease risk of falls. Baseline: 12.27sec Goal status: INITIAL  6.  Patient will score 48/80 on LEFS due to improved function of Lt LE. Baseline: 38/80  Goal status: INITIAL   PLAN:  PT FREQUENCY: 2x/week  PT DURATION: 8 weeks  PLANNED INTERVENTIONS: 97164- PT Re-evaluation, 97110-Therapeutic exercises, 97530- Therapeutic activity, 97112- Neuromuscular re-education, [phone]- Self Care, 02859- Manual therapy, [phone]- Gait training, [phone]- Canalith repositioning, [id number]- Aquatic Therapy, [phone]- Electrical stimulation (unattended), [phone]- Electrical stimulation (manual), [id number]- Ionotophoresis 4mg /ml Dexamethasone , 20560 (1-2 muscles), 20561 (3+ muscles)- Dry Needling, Patient/Family education, Balance training, Stair training, Taping, Joint mobilization, Joint manipulation, Spinal manipulation, Spinal mobilization, Vestibular training, Cryotherapy, and Moist heat  PLAN FOR NEXT SESSION: ; single leg strength & balance; continue hip strengthening & balance training    Kristeen Sar, PT 10/16/23 1:19 PM [facility] [address], [address] [place], [place] [address] Phone # [phone] Fax [phone]

## 2023-10-18 ENCOUNTER — Encounter: Payer: Self-pay | Admitting: Physical Therapy

## 2023-10-18 ENCOUNTER — Telehealth: Payer: Self-pay | Admitting: Cardiology

## 2023-10-18 ENCOUNTER — Ambulatory Visit: Admitting: Physical Therapy

## 2023-10-18 DIAGNOSIS — R2689 Other abnormalities of gait and mobility: Secondary | ICD-10-CM

## 2023-10-18 DIAGNOSIS — M25552 Pain in left hip: Secondary | ICD-10-CM

## 2023-10-18 DIAGNOSIS — M6281 Muscle weakness (generalized): Secondary | ICD-10-CM

## 2023-10-18 NOTE — Telephone Encounter (Signed)
   Name: Alejandro Foster  DOB: [birth year]  MRN: [id number]  Primary Cardiologist: None   Preoperative team, please contact this patient and set up a phone call appointment for further preoperative risk assessment. Please obtain consent and complete medication review. Thank you for your help.  I confirm that guidance regarding antiplatelet and oral anticoagulation therapy has been completed and, if necessary, noted below.  Regarding ASA therapy, we recommend continuation of ASA throughout the perioperative period. However, if the surgeon feels that cessation of ASA is required in the perioperative period, it may be stopped 5-7 days prior to surgery with a plan to resume it as soon as felt to be feasible from a surgical standpoint in the post-operative period.    I also confirmed the patient resides in the state of [place] . As per [facility] Medical Board telemedicine laws, the patient must reside in the state in which the provider is licensed.   Josefa CHRISTELLA [place]vais, NP 10/18/2023, 10:44 AM [facility]

## 2023-10-18 NOTE — Telephone Encounter (Signed)
 Left message to call back to schedule tele pre op appt.

## 2023-10-18 NOTE — Telephone Encounter (Signed)
   Pre-operative Risk Assessment    Patient Name: Alejandro Foster  DOB: 13-Jul-1933 MRN: [id number]      Request for Surgical Clearance    Procedure:  Direct visual internal urethrotomy and dilation  Date of Surgery:  Clearance 11/02/23                                 Surgeon: Dr. Sherwood Edison Surgeon's Group or Practice Name: [facility] Phone number: [phone] Fax number: [phone]   Type of Clearance Requested:   - Medical  - Pharmacy:  Hold        Type of Anesthesia:  General    Additional requests/questions:  Please advise surgeon/provider what medications should be held.  Alejandro Foster   10/18/2023, 8:43 AM

## 2023-10-18 NOTE — Therapy (Signed)
 OUTPATIENT PHYSICAL THERAPY LOWER EXTREMITY TREATMENT   Patient Name: Alejandro Foster MRN: [id number] DOB:16-Feb-1934, 89 y.o., male Today's Date: 10/18/2023  END OF SESSION:  PT End of Session - 10/18/23 1108     Visit Number 8    Date for PT Re-Evaluation 10/24/23    Authorization Type [employer] Medicare [employer] approved 16 visits 08/29/2023 - 10/24/2023 [id number]    Authorization Time Period 08/29/2023 - 10/24/2023    Authorization - Visit Number 8    Authorization - Number of Visits 16    Progress Note Due on Visit 10    PT Start Time 1019    PT Stop Time 1059    PT Time Calculation (min) 40 min    Activity Tolerance Patient tolerated treatment well    Behavior During Therapy WFL for tasks assessed/performed                Past Medical History:  Diagnosis Date   Anemia    Arthritis    Hands   Bladder cancer (HCC)    Chronic bilateral thoracic back pain 12/12/2017   Complication of anesthesia    Hard time waking up in the past   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) 11/10/2019   Coronary artery disease    cardiologist-  dr blanca--  s/p  cardiac cath's w/ angioplasty and stenting x2   DDD (degenerative disc disease), lumbar    Depression    Diabetic peripheral neuropathy associated with type 2 diabetes mellitus (HCC) 12/12/2017   Dyslipidemia 01/25/2018   Dyspnea on exertion 08/22/2019   Foraminal stenosis of lumbar region 12/12/2017   GERD (gastroesophageal reflux disease)    History of kidney stones    Hyperlipidemia    Hypertension    Lumbar radiculopathy, right    right leg weakness   Myofascial pain syndrome 12/12/2017   Nephrolithiasis    bilateral non-obstructive per ct 01-19-2016   Peripheral neuropathy    feet   Posterior vitreous detachment of both eyes 11/10/2019   Pseudophakia of both eyes 11/10/2019   Right leg weakness    due to lumbar radiculopathy   Right ureteral stone    S/P coronary  artery stent placement    2002 x1  and 2003 x1   Spondylosis without myelopathy or radiculopathy, lumbar region 12/12/2017   Trigger finger 05/28/2018   Type 2 diabetes mellitus (HCC)    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) 11/10/2019   Wears hearing aid    BILATERAL   Past Surgical History:  Procedure Laterality Date   BIOPSY  07/11/2022   Procedure: BIOPSY;  Surgeon: Saintclair Jasper, MD;  Location: [facility] ENDOSCOPY;  Service: Gastroenterology;;   BOTOX  INJECTION N/A 03/20/2023   Procedure: BOTOX  INJECTION;  Surgeon: Saintclair Jasper, MD;  Location: [facility] ENDOSCOPY;  Service: Gastroenterology;  Laterality: N/A;   CARDIAC CATHETERIZATION  01/ 2002   dr blanca   mLAD 30%,  CFX OM3 40%,  RCA 30% previous stent site   CARDIOVASCULAR STRESS TEST  04-15-2009  dr blanca   normal nuclear study w/ no ischemia/  normal LV function and wall motion , ef 77%   CATARACT EXTRACTION W/ INTRAOCULAR LENS  IMPLANT, BILATERAL  2013  approx.   COLONOSCOPY WITH PROPOFOL   2014   CORONARY ANGIOPLASTY  1990  dr blanca   PTCA to OM2   CORONARY ANGIOPLASTY  10/ 1996  dr blanca   PTCA to  RCA   CORONARY ANGIOPLASTY WITH STENT PLACEMENT  04/ 1996  dr blanca   stenting to RCA   CYSTOSCOPY WITH URETEROSCOPY AND STENT PLACEMENT Right 03/28/2016   Procedure: CYSTOSCOPY WITH RIGHT  URETEROSCOPY AND STENT PLACEMENT;  Surgeon: Donnice Brooks, MD;  Location: [facility];  Service: Urology;  Laterality: Right;   ESOPHAGOGASTRODUODENOSCOPY (EGD) WITH PROPOFOL  N/A 07/11/2022   Procedure: ESOPHAGOGASTRODUODENOSCOPY (EGD) WITH PROPOFOL ;  Surgeon: Saintclair Jasper, MD;  Location: [facility] ENDOSCOPY;  Service: Gastroenterology;  Laterality: N/A;   ESOPHAGOGASTRODUODENOSCOPY (EGD) WITH PROPOFOL  N/A 03/20/2023   Procedure: ESOPHAGOGASTRODUODENOSCOPY (EGD) WITH PROPOFOL ;  Surgeon: Saintclair Jasper, MD;  Location: [facility] ENDOSCOPY;  Service: Gastroenterology;  Laterality: N/A;  with botox   injection s   EXTRACORPOREAL SHOCK WAVE LITHOTRIPSY  yrs ago   HOLMIUM LASER APPLICATION Right 03/28/2016   Procedure: HOLMIUM LASER APPLICATION;  Surgeon: Donnice Brooks, MD;  Location: [facility];  Service: Urology;  Laterality: Right;   INTRAMEDULLARY (IM) NAIL INTERTROCHANTERIC Left 07/07/2022   Procedure: INTRAMEDULLARY (IM) NAILING OF LEFT FEMUR;  Surgeon: Jerri Kay HERO, MD;  Location: [facility] OR;  Service: Orthopedics;  Laterality: Left;   LAPAROSCOPIC CHOLECYSTECTOMY  1990's   SUBMUCOSAL INJECTION  07/11/2022   Procedure: SUBMUCOSAL INJECTION;  Surgeon: Saintclair Jasper, MD;  Location: [facility] ENDOSCOPY;  Service: Gastroenterology;;   TONSILLECTOMY AND ADENOIDECTOMY  child   URETEROLITHOTOMY  1990's   Patient Active Problem List   Diagnosis Date Noted   Fall 07/06/2022   Displaced intertrochanteric fracture of left femur, initial encounter for closed fracture (HCC) 07/06/2022   CAD (coronary artery disease) 07/06/2022   Coronary artery disease 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) 05/12/2022   Paving stone retinal degeneration, bilateral 11/03/2021   Senile purpura (HCC) 09/07/2020   Claudication (HCC) 03/17/2020   Wears hearing aid    Type 2 diabetes mellitus (HCC)    S/P coronary artery stent placement    Right ureteral stone    Right leg weakness    Lumbar radiculopathy, right    History of kidney stones    DDD (degenerative disc disease), lumbar    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) 11/10/2019   Pseudophakia of both eyes 11/10/2019   Posterior vitreous detachment of both eyes 11/10/2019   Amnesia 09/19/2019   Dyspnea on exertion 08/22/2019   Atypical chest pain 08/22/2019   Diabetic retinopathy associated with type 2 diabetes mellitus (HCC) 03/27/2019   Trigger finger 05/28/2018   Dyslipidemia 01/25/2018   Diabetic peripheral neuropathy associated with type 2 diabetes  mellitus (HCC) 12/12/2017   Chronic bilateral thoracic back pain 12/12/2017   Myofascial pain syndrome 12/12/2017   Foraminal stenosis of lumbar region 12/12/2017   Spondylosis without myelopathy or radiculopathy, lumbar region 12/12/2017   Cough 09/17/2017   Irritability and anger 09/17/2017   Localized edema 08/01/2017   Abnormal gait 01/09/2017   Fatigue 09/12/2016   Hypertensive heart disease without congestive heart failure 09/03/2014   Long term (current) use of insulin  (HCC) 09/03/2014   Ventricular premature depolarization 09/03/2014   Encounter for general adult medical examination without abnormal findings 08/26/2014   Gout 08/28/2013   Impacted cerumen 08/28/2013   Hypo-osmolality and hyponatremia 08/28/2013   Urge incontinence of urine 08/26/2012   Age-related osteoporosis without current pathological fracture 02/19/2012   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   Vitamin D deficiency 02/19/2012   Underimmunization status 12/26/2011   Backache 08/21/2011   Hearing loss 05/23/2011  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) 03/22/2010   Kidney stone 05/25/2009   ED (erectile dysfunction) of organic origin 05/25/2009   Obesity 05/25/2009   Metabolic syndrome 05/25/2009   Polyneuropathy 12/25/2008   Hyperglycemia due to type 2 diabetes mellitus (HCC) 12/25/2008   Gastro-esophageal reflux disease without esophagitis 12/25/2008   Insomnia 12/25/2008   Dilatation of aorta (HCC) 10/02/2008   Essential hypertension 10/02/2008   Hyperlipidemia 10/02/2008    PCP: Onita Rush, MD   REFERRING PROVIDER: Jerri Kay HERO, MD  REFERRING DIAG: [phone] (ICD-10-CM) - Displaced intertrochanteric fracture of left femur, initial encounter for closed fracture [facility])  THERAPY DIAG:  Pain in left hip  Muscle weakness (generalized)  Other abnormalities of gait and mobility  Rationale for Evaluation and Treatment: Rehabilitation  ONSET DATE: Jul 19 2022  SUBJECTIVE:   SUBJECTIVE STATEMENT: Patient reports he is doing good today. He is not currently having any pain.   From Eval: Patient presents with left hip pain that began when he broke his femur Jul 19 2022.  He was in the hospital and did PT at [facility]. He has been traveling and he has gain some weight and he feels he is limping more. Patient reports he had imaging done today and every has healed fine there is just some inflammation. It has been challenging to putting on his shoes, bending, and he feels his balance is off. Previously he was able to walk a mile and now he is limited to 0.5 miles.   PERTINENT HISTORY: Left femur fx May 2024; Type 2 DB; lumbar DDD; peripheral neuropathy; HTN; PAIN:  10/02/23 Are you having pain? Yes: NPRS scale: 2-3/ 10 Pain location: lateral femur; where his scares or located Pain description: constant soreness; achy Aggravating factors: bending, walking Relieving factors: Tylenol    PRECAUTIONS: None  RED FLAGS: None   WEIGHT BEARING RESTRICTIONS: No  FALLS:  Has patient fallen in last 6 months? Yes. Number of falls 1 he fell in the hospital walking with his walker  LIVING ENVIRONMENT: Lives with: lives with their spouse Lives in: House/apartment Stairs: Yes: Internal: 12 steps; on left going up Has following equipment at home: Walking cane  OCCUPATION: Retired Nutritional therapist  PLOF: Independent, Independent with basic ADLs, Independent with household mobility without device, Independent with community mobility without device, Independent with gait, and Independent with transfers  PATIENT GOALS: To be able to go camping again and be about to walk without a limp  NEXT MD VISIT: PRN  OBJECTIVE:  Note: Objective measures were completed at Evaluation unless otherwise noted.  DIAGNOSTIC FINDINGS: No final results yet  PATIENT SURVEYS:  LEFS: 38/80 47.5%  COGNITION: Overall cognitive status: Within functional limits for  tasks assessed     SENSATION: WFL   MUSCLE LENGTH: Hamstrings: limited bilateral    POSTURE: rounded shoulders and forward head  PALPATION: Tenderness to palpation Lt TFL, glutes, and quads  LOWER EXTREMITY ROM: WFL; limited Lt hip IR/ER   LOWER EXTREMITY MMT:  MMT Right eval Left eval  Hip flexion 4+ 4-*  Hip extension    Hip abduction 4+ 4-  Hip adduction    Hip internal rotation    Hip external rotation    Knee flexion 4+ 4+  Knee extension 4+ 4shaky  Ankle dorsiflexion    Ankle plantarflexion    Ankle inversion    Ankle eversion     (Blank rows = not tested)    FUNCTIONAL TESTS:  5  times sit to stand: 10.67 sec no UE support Timed up and go (TUG): 12.27 6 minute walk test: 892FT ; groin pain after 5 mins;  10/18/2023 1066ft . Had some left hip soreness after 4: 30  GAIT: Distance walked: 892 FT Assistive device utilized: None Level of assistance: Complete Independence Comments: decreased step length on Rt; decreased stance time on Lt ; antaglic gait                                                                                                                                TREATMENT DATE:  10/18/2023 NuStep Level 6 5 mins- PT present to discuss status 1074ft . Had some left hip soreness after 4: 30 Leg Press 95# bilateral 2 x 10; 45# unilateral (Lt ); 55# (Rt) x 20 (seat 6) Resisted walking with belt 10# (backwards & sideways) x 5 laps each Step ups at stair holding unilateral 8# DB x 15 bilateral  Discussion of POC plan to do(1x week 6 weeks starting 8/25. Patient will be having a minor procedure) Standing hamstring stretch at stair 2 x 30 sec bilateral    10/16/2023 NuStep Level 6 5 mins- PT present to discuss status Green TB around ankles (lateral step & backwards step) 2 x 10 bilateral  Side stepping with green TB around ankles at barre x 4 laps 6in step ups holding two 8# DB x 12 bilateral (SBA)- forwards and lateral Single leg  on airex + cone tap (cone forwards & lateral) Single leg cone tap (cone infront and lateral) standing on airex x 8 taps each leg min- mod assist at barre Discussion & demonstration of correct technique when lifting objects from floor, safe transfer from ground Single leg lunge holding 8# DB x 10 bilateral  Leg Press 95# bilateral 2 x 10; 45# unilateral (Lt ); 55# (Rt) x 20 Hurdles (forwards & lateral) step over pattern x 2 laps each no UE support Bilateral farmer's carry (around both gyms) 8# x 1laps     10/11/2023 NuStep Level 6 6 mins- PT present to discuss status Standing hamstring stretch 3 x 30 sec bilateral  Standing quad/hip flexor stretch 2 x 30 sec bilateral  Seated piriformis stretch 2 x 30 sec bilateral  Green TB around ankles (lateral step & backwards step) 2 x 10 bilateral  6in step ups holding two 8# DB x 12 bilateral (SBA)- forwards and lateral Sit to stand + chest press holding 8# DB 2 x 10 Hurdles (forwards & lateral) step over pattern x 2 laps each Single leg cone tap (cone infront and lateral) x 8 taps each leg Bilateral farmer's carry (around both gyms) 8# x 1laps 8#    10/09/2023 NuStep Level 6 6 mins- PT present to discuss status Standing hamstring stretch 3 x 30 sec bilateral  Standing quad/hip flexor stretch 2 x 30 sec bilateral  Side stepping with green TB around ankles x 3 laps in //  bars 6 in step ups at barre x 12 bilateral  6in lateral step ups x 12 bilateral  Hip matrix hip abduction and extension both LE's with 40 lbs 2 x 10 each Sit to stand with 7# DB 2x 0 Unilateral farmer's carry (down long hallway and back) 7# x 2 laps switching hands    PATIENT EDUCATION:  Education details: Ice; POC; examination findings; HEP Person educated: Patient Education method: Explanation, Demonstration, and Handouts Education comprehension: verbalized understanding, returned demonstration, and needs further education  HOME EXERCISE PROGRAM: Access Code:  [email or web address] URL: [email or web address] Date: 10/11/2023 Prepared by: Kristeen Sar  Exercises - Supine Gluteus Stretch  - 1 x daily - 7 x weekly - 2 sets - 20-30 hold - Supine Hamstring Stretch with Strap  - 1 x daily - 7 x weekly - 2 sets - 20-30 hold - Supine ITB Stretch with Strap  - 1 x daily - 7 x weekly - 2 sets - 20-30 hold - Seated Long Arc Quad  - 1 x daily - 7 x weekly - 2 sets - 10 reps - 2 hold - Standing Hamstring Stretch on Chair  - 1 x daily - 7 x weekly - 1 sets - 3 reps - 30 sec hold - Quadricep Stretch with Chair and Counter Support  - 1 x daily - 7 x weekly - 1 sets - 3 reps - 30 sec hold - Seated Figure 4 Piriformis Stretch  - 1 x daily - 7 x weekly - 1 sets - 3 reps - 30 sed hold - Standing 3-Way Leg Reach with Resistance at Ankles and Unilateral Counter Support  - 1 x daily - 7 x weekly - 2 sets - 10 reps ASSESSMENT:  CLINICAL IMPRESSION: Re-administered and patient ambulated 141ft more than last administration. Incorporated resisted walking for balance and hip strengthening and patient performed exercise well. Patient is progressing appropriately with physical therapy. All short term goals at me     OBJECTIVE IMPAIRMENTS: Abnormal gait, decreased balance, difficulty walking, decreased ROM, decreased strength, increased muscle spasms, impaired flexibility, postural dysfunction, and pain.   ACTIVITY LIMITATIONS: carrying, bending, dressing, and locomotion level  PARTICIPATION LIMITATIONS: cleaning, laundry, community activity, school, and camping/ hiking  PERSONAL FACTORS: Age, Fitness, Time since onset of injury/illness/exacerbation, and 3+ comorbidities: Left femur fx May 2024; Type 2 DB;  peripheral neuropathy; HTN; are also affecting patient's functional outcome.   REHAB POTENTIAL: Good  CLINICAL DECISION MAKING: Stable/uncomplicated  EVALUATION COMPLEXITY: Moderate   GOALS: Goals reviewed with patient? Yes  SHORT TERM GOALS: Target date:  09/26/2023  Patient will be independent with initial HEP. Baseline:  Goal status: MET 10/18/2023  2.  Patient will be able to walk 0.5 miles without left him pain or discomfort. Baseline: increased pain and has to stop Goal status: MET 10/02/23  3.  Patient will ambulate > or = to 1,000 feet during for improved community ambulation. Baseline: 88ft Goal status: MET 10/18/2023    LONG TERM GOALS: Target date: 10/24/2023  Patient will demonstrate independence in advanced HEP. Baseline:  Goal status: INITIAL  2.  Patient will be able to walk a mile with no left hip discomfort for improved cardiovascular endurance. Baseline: unable to walk 1 mile Goal status: INITIAL  3.  Patient will verbalize and demonstrate self-care strategies to manage pain including tissue mobility practices and change of position. Baseline:  Goal status: INITIAL  4.  Patient will be able to bend and put on his socks and  shoes with no left hip discomfort. Baseline: 2/10 pain Goal status: INITIAL  5.  Patient will perform TUG in < or = to 9 sec to decrease risk of falls. Baseline: 12.27sec Goal status: INITIAL  6.  Patient will score 48/80 on LEFS due to improved function of Lt LE. Baseline: 38/80  Goal status: INITIAL   PLAN:  PT FREQUENCY: 2x/week  PT DURATION: 8 weeks  PLANNED INTERVENTIONS: 97164- PT Re-evaluation, 97110-Therapeutic exercises, 97530- Therapeutic activity, 97112- Neuromuscular re-education, 97535- Self Care, 02859- Manual therapy, [phone]- Gait training, [phone]- Canalith repositioning, [id number]- Aquatic Therapy, [phone]- Electrical stimulation (unattended), [phone]- Electrical stimulation (manual), [id number]- Ionotophoresis 4mg /ml Dexamethasone , 79439 (1-2 muscles), 20561 (3+ muscles)- Dry Needling, Patient/Family education, Balance training, Stair training, Taping, Joint mobilization, Joint manipulation, Spinal manipulation, Spinal mobilization, Vestibular training, Cryotherapy, and Moist  heat  PLAN FOR NEXT SESSION: recert next visit & 10th visit PN; single leg strength & balance; continue hip strengthening & balance training    Kristeen Sar, PT 10/18/23 11:12 AM [facility] [address], [address] [place], [place] [address] Phone # [phone] Fax [phone]

## 2023-10-22 ENCOUNTER — Encounter: Payer: Self-pay | Admitting: Physical Therapy

## 2023-10-22 ENCOUNTER — Ambulatory Visit: Attending: Orthopaedic Surgery | Admitting: Physical Therapy

## 2023-10-22 DIAGNOSIS — R2689 Other abnormalities of gait and mobility: Secondary | ICD-10-CM | POA: Insufficient documentation

## 2023-10-22 DIAGNOSIS — M6281 Muscle weakness (generalized): Secondary | ICD-10-CM | POA: Diagnosis present

## 2023-10-22 DIAGNOSIS — R293 Abnormal posture: Secondary | ICD-10-CM | POA: Insufficient documentation

## 2023-10-22 DIAGNOSIS — R262 Difficulty in walking, not elsewhere classified: Secondary | ICD-10-CM | POA: Diagnosis present

## 2023-10-22 DIAGNOSIS — M25552 Pain in left hip: Secondary | ICD-10-CM | POA: Diagnosis present

## 2023-10-22 DIAGNOSIS — R252 Cramp and spasm: Secondary | ICD-10-CM | POA: Diagnosis present

## 2023-10-22 NOTE — Therapy (Signed)
 OUTPATIENT PHYSICAL THERAPY LOWER EXTREMITY TREATMENT/ RE-CERTIFICATION   Progress Note Reporting Period 08/29/2023 to 10/22/2023  See note below for Objective Data and Assessment of Progress/Goals.     Patient Name: Alejandro Foster MRN: [id number] DOB:03/28/1933, 89 y.o., male Today's Date: 10/22/2023  END OF SESSION:  PT End of Session - 10/22/23 1124     Visit Number 9    Date for PT Re-Evaluation 12/24/23    Authorization Type [employer] Medicare [employer] approved 16 visits 08/29/2023 - 10/24/2023 [id number]    Authorization Time Period 08/29/2023 - 10/24/2023    Authorization - Visit Number 9    Authorization - Number of Visits 16    Progress Note Due on Visit 18    PT Start Time 1016    PT Stop Time 1056    PT Time Calculation (min) 40 min    Activity Tolerance Patient tolerated treatment well    Behavior During Therapy WFL for tasks assessed/performed                 Past Medical History:  Diagnosis Date   Anemia    Arthritis    Hands   Bladder cancer (HCC)    Chronic bilateral thoracic back pain 12/12/2017   Complication of anesthesia    Hard time waking up in the past   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) 11/10/2019   Coronary artery disease    cardiologist-  dr blanca--  s/p  cardiac cath's w/ angioplasty and stenting x2   DDD (degenerative disc disease), lumbar    Depression    Diabetic peripheral neuropathy associated with type 2 diabetes mellitus (HCC) 12/12/2017   Dyslipidemia 01/25/2018   Dyspnea on exertion 08/22/2019   Foraminal stenosis of lumbar region 12/12/2017   GERD (gastroesophageal reflux disease)    History of kidney stones    Hyperlipidemia    Hypertension    Lumbar radiculopathy, right    right leg weakness   Myofascial pain syndrome 12/12/2017   Nephrolithiasis    bilateral non-obstructive per ct 01-19-2016   Peripheral neuropathy    feet   Posterior vitreous detachment of both eyes  11/10/2019   Pseudophakia of both eyes 11/10/2019   Right leg weakness    due to lumbar radiculopathy   Right ureteral stone    S/P coronary artery stent placement    2002 x1  and 2003 x1   Spondylosis without myelopathy or radiculopathy, lumbar region 12/12/2017   Trigger finger 05/28/2018   Type 2 diabetes mellitus (HCC)    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) 11/10/2019   Wears hearing aid    BILATERAL   Past Surgical History:  Procedure Laterality Date   BIOPSY  07/11/2022   Procedure: BIOPSY;  Surgeon: Saintclair Jasper, MD;  Location: [facility] ENDOSCOPY;  Service: Gastroenterology;;   BOTOX  INJECTION N/A 03/20/2023   Procedure: BOTOX  INJECTION;  Surgeon: Saintclair Jasper, MD;  Location: [facility]COPY;  Service: Gastroenterology;  Laterality: N/A;   CARDIAC CATHETERIZATION  01/ 2002   dr blanca   mLAD 30%,  CFX OM3 40%,  RCA 30% previous stent site   CARDIOVASCULAR STRESS TEST  04-15-2009  dr blanca   normal nuclear study w/ no ischemia/  normal LV function and wall motion , ef 77%   CATARACT EXTRACTION W/ INTRAOCULAR LENS  IMPLANT, BILATERAL  2013  approx.   COLONOSCOPY WITH PROPOFOL   2014   CORONARY ANGIOPLASTY  1990  dr blanca   PTCA to OM2   CORONARY ANGIOPLASTY  10/ 1996  dr blanca   PTCA to RCA   CORONARY ANGIOPLASTY WITH STENT PLACEMENT  04/ 1996  dr blanca   stenting to RCA   CYSTOSCOPY WITH URETEROSCOPY AND STENT PLACEMENT Right 03/28/2016   Procedure: CYSTOSCOPY WITH RIGHT  URETEROSCOPY AND STENT PLACEMENT;  Surgeon: Donnice Brooks, MD;  Location: [facility];  Service: Urology;  Laterality: Right;   ESOPHAGOGASTRODUODENOSCOPY (EGD) WITH PROPOFOL  N/A 07/11/2022   Procedure: ESOPHAGOGASTRODUODENOSCOPY (EGD) WITH PROPOFOL ;  Surgeon: Saintclair Jasper, MD;  Location: [facility] ENDOSCOPY;  Service: Gastroenterology;  Laterality: N/A;   ESOPHAGOGASTRODUODENOSCOPY (EGD) WITH PROPOFOL  N/A 03/20/2023   Procedure:  ESOPHAGOGASTRODUODENOSCOPY (EGD) WITH PROPOFOL ;  Surgeon: Saintclair Jasper, MD;  Location: [facility] ENDOSCOPY;  Service: Gastroenterology;  Laterality: N/A;  with botox  injection s   EXTRACORPOREAL SHOCK WAVE LITHOTRIPSY  yrs ago   HOLMIUM LASER APPLICATION Right 03/28/2016   Procedure: HOLMIUM LASER APPLICATION;  Surgeon: Donnice Brooks, MD;  Location: [facility];  Service: Urology;  Laterality: Right;   INTRAMEDULLARY (IM) NAIL INTERTROCHANTERIC Left 07/07/2022   Procedure: INTRAMEDULLARY (IM) NAILING OF LEFT FEMUR;  Surgeon: Jerri Kay HERO, MD;  Location: [facility] OR;  Service: Orthopedics;  Laterality: Left;   LAPAROSCOPIC CHOLECYSTECTOMY  1990's   SUBMUCOSAL INJECTION  07/11/2022   Procedure: SUBMUCOSAL INJECTION;  Surgeon: Saintclair Jasper, MD;  Location: [facility] ENDOSCOPY;  Service: Gastroenterology;;   TONSILLECTOMY AND ADENOIDECTOMY  child   URETEROLITHOTOMY  1990's   Patient Active Problem List   Diagnosis Date Noted   Fall 07/06/2022   Displaced intertrochanteric fracture of left femur, initial encounter for closed fracture (HCC) 07/06/2022   CAD (coronary artery disease) 07/06/2022   Coronary artery disease 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) 05/12/2022   Paving stone retinal degeneration, bilateral 11/03/2021   Senile purpura (HCC) 09/07/2020   Claudication (HCC) 03/17/2020   Wears hearing aid    Type 2 diabetes mellitus (HCC)    S/P coronary artery stent placement    Right ureteral stone    Right leg weakness    Lumbar radiculopathy, right    History of kidney stones    DDD (degenerative disc disease), lumbar    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) 11/10/2019   Pseudophakia of both eyes 11/10/2019   Posterior vitreous detachment of both eyes 11/10/2019   Amnesia 09/19/2019   Dyspnea on exertion 08/22/2019   Atypical chest pain 08/22/2019   Diabetic retinopathy associated with type 2  diabetes mellitus (HCC) 03/27/2019   Trigger finger 05/28/2018   Dyslipidemia 01/25/2018   Diabetic peripheral neuropathy associated with type 2 diabetes mellitus (HCC) 12/12/2017   Chronic bilateral thoracic back pain 12/12/2017   Myofascial pain syndrome 12/12/2017   Foraminal stenosis of lumbar region 12/12/2017   Spondylosis without myelopathy or radiculopathy, lumbar region 12/12/2017   Cough 09/17/2017   Irritability and anger 09/17/2017   Localized edema 08/01/2017   Abnormal gait 01/09/2017   Fatigue 09/12/2016   Hypertensive heart disease without congestive heart failure 09/03/2014   Long term (current) use of insulin  (HCC) 09/03/2014   Ventricular premature depolarization 09/03/2014   Encounter for general adult medical examination without abnormal findings 08/26/2014   Gout 08/28/2013   Impacted cerumen 08/28/2013   Hypo-osmolality and hyponatremia 08/28/2013   Urge incontinence of urine 08/26/2012   Age-related osteoporosis without current pathological fracture 02/19/2012   Testicular  hypofunction 02/19/2012   Vitamin D deficiency 02/19/2012   Underimmunization status 12/26/2011   Backache 08/21/2011   Hearing loss 05/23/2011   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) 03/22/2010   Kidney stone 05/25/2009   ED (erectile dysfunction) of organic origin 05/25/2009   Obesity 05/25/2009   Metabolic syndrome 05/25/2009   Polyneuropathy 12/25/2008   Hyperglycemia due to type 2 diabetes mellitus (HCC) 12/25/2008   Gastro-esophageal reflux disease without esophagitis 12/25/2008   Insomnia 12/25/2008   Dilatation of aorta (HCC) 10/02/2008   Essential hypertension 10/02/2008   Hyperlipidemia 10/02/2008    PCP: Onita Rush, MD   REFERRING PROVIDER: Jerri Kay HERO, MD  REFERRING DIAG: [phone] (ICD-10-CM) - Displaced intertrochanteric fracture of left femur, initial encounter for closed fracture [facility])  THERAPY DIAG:  Pain in left  hip  Muscle weakness (generalized)  Other abnormalities of gait and mobility  Rationale for Evaluation and Treatment: Rehabilitation  ONSET DATE: Jul 19 2022  SUBJECTIVE:   SUBJECTIVE STATEMENT: Patient reports he was able to walk a mile yesterday with no difficulty.   From Eval: Patient presents with left hip pain that began when he broke his femur Jul 19 2022.  He was in the hospital and did PT at [facility] He has been traveling and he has gain some weight and he feels he is limping more. Patient reports he had imaging done today and every has healed fine there is just some inflammation. It has been challenging to putting on his shoes, bending, and he feels his balance is off. Previously he was able to walk a mile and now he is limited to 0.5 miles.   PERTINENT HISTORY: Left femur fx May 2024; Type 2 DB; lumbar DDD; peripheral neuropathy; HTN; PAIN:  10/22/23 Are you having pain? Yes: NPRS scale: 2/ 10 Pain location: lateral femur; where his scares or located Pain description: constant soreness; achy Aggravating factors: bending, walking Relieving factors: Tylenol    PRECAUTIONS: None  RED FLAGS: None   WEIGHT BEARING RESTRICTIONS: No  FALLS:  Has patient fallen in last 6 months? Yes. Number of falls 1 he fell in the hospital walking with his walker  LIVING ENVIRONMENT: Lives with: lives with their spouse Lives in: House/apartment Stairs: Yes: Internal: 12 steps; on left going up Has following equipment at home: Walking cane  OCCUPATION: Retired Nutritional therapist  PLOF: Independent, Independent with basic ADLs, Independent with household mobility without device, Independent with community mobility without device, Independent with gait, and Independent with transfers  PATIENT GOALS: To be able to go camping again and be about to walk without a limp  NEXT MD VISIT: PRN  OBJECTIVE:  Note: Objective measures were completed at Evaluation unless otherwise  noted.  DIAGNOSTIC FINDINGS: No final results yet  PATIENT SURVEYS:  LEFS: 38/80 47.5%  10/22/2023 LEFS: 56/80 70%  COGNITION: Overall cognitive status: Within functional limits for tasks assessed     SENSATION: WFL   MUSCLE LENGTH: Hamstrings: limited bilateral    POSTURE: rounded shoulders and forward head  PALPATION: Tenderness to palpation Lt TFL, glutes, and quads  LOWER EXTREMITY ROM: WFL; limited Lt hip IR/ER   LOWER EXTREMITY MMT:  MMT Right eval Left eval  Hip flexion 4+ 4-*  Hip extension    Hip abduction 4+ 4-  Hip adduction    Hip internal rotation    Hip external rotation    Knee flexion 4+ 4+  Knee extension 4+ 4shaky  Ankle dorsiflexion  Ankle plantarflexion    Ankle inversion    Ankle eversion     (Blank rows = not tested)    FUNCTIONAL TESTS:  5 times sit to stand: 10.67 sec no UE support Timed up and go (TUG): 12.27 6 minute walk test: 892FT ; groin pain after 5 mins;   10/18/2023 10109ft . Had some left hip soreness after 4: 30  10/22/2023 TUG: 8.78ft GAIT: Distance walked: 892 FT Assistive device utilized: None Level of assistance: Complete Independence Comments: decreased step length on Rt; decreased stance time on Lt ; antaglic gait                                                                                                                                TREATMENT DATE:  10/22/2023 NuStep Level 5 5 mins- PT present to discuss status Goal Assessment LEFS: 56/80 70% TUG 8.88 sec Resisted walking with belt 10# (backwards & sideways) x 5 laps each Sit to stand holding 7 # 2 x10 Leg Press 95# bilateral 2 x 10; 60# unilateral x 20 bilateral  6in Step ups at stair holding unilateral 8# DB x 12 bilateral (forwards) 6in lateral step ups unilateral 8# DB hold x 12 bilateral  Marching on airex no UE support x 20 Heel raised on airex x 20 Discussion of flexibility exercises he can do to help him touch his toes to help tie  his shoes.    10/18/2023 NuStep Level 6 5 mins- PT present to discuss status 1016ft . Had some left hip soreness after 4: 30 Leg Press 95# bilateral 2 x 10; 45# unilateral (Lt ); 55# (Rt) x 20 (seat 6) Resisted walking with belt 10# (backwards & sideways) x 5 laps each Step ups at stair holding unilateral 8# DB x 15 bilateral  Discussion of POC plan to do(1x week 6 weeks starting 8/25. Patient will be having a minor procedure) Standing hamstring stretch at stair 2 x 30 sec bilateral    10/16/2023 NuStep Level 6 5 mins- PT present to discuss status Green TB around ankles (lateral step & backwards step) 2 x 10 bilateral  Side stepping with green TB around ankles at barre x 4 laps 6in step ups holding two 8# DB x 12 bilateral (SBA)- forwards and lateral Single leg on airex + cone tap (cone forwards & lateral) Single leg cone tap (cone infront and lateral) standing on airex x 8 taps each leg min- mod assist at barre Discussion & demonstration of correct technique when lifting objects from floor, safe transfer from ground Single leg lunge holding 8# DB x 10 bilateral  Leg Press 95# bilateral 2 x 10; 45# unilateral (Lt ); 55# (Rt) x 20 Hurdles (forwards & lateral) step over pattern x 2 laps each no UE support Bilateral farmer's carry (around both gyms) 8# x 1laps     10/11/2023 NuStep Level 6 6 mins- PT present to discuss status Standing hamstring  stretch 3 x 30 sec bilateral  Standing quad/hip flexor stretch 2 x 30 sec bilateral  Seated piriformis stretch 2 x 30 sec bilateral  Green TB around ankles (lateral step & backwards step) 2 x 10 bilateral  6in step ups holding two 8# DB x 12 bilateral (SBA)- forwards and lateral Sit to stand + chest press holding 8# DB 2 x 10 Hurdles (forwards & lateral) step over pattern x 2 laps each Single leg cone tap (cone infront and lateral) x 8 taps each leg Bilateral farmer's carry (around both gyms) 8# x 1laps 8#    10/09/2023 NuStep Level 6  6 mins- PT present to discuss status Standing hamstring stretch 3 x 30 sec bilateral  Standing quad/hip flexor stretch 2 x 30 sec bilateral  Side stepping with green TB around ankles x 3 laps in // bars 6 in step ups at barre x 12 bilateral  6in lateral step ups x 12 bilateral  Hip matrix hip abduction and extension both LE's with 40 lbs 2 x 10 each Sit to stand with 7# DB 2x 0 Unilateral farmer's carry (down long hallway and back) 7# x 2 laps switching hands    PATIENT EDUCATION:  Education details: Ice; POC; examination findings; HEP Person educated: Patient Education method: Explanation, Demonstration, and Handouts Education comprehension: verbalized understanding, returned demonstration, and needs further education  HOME EXERCISE PROGRAM: Access Code: [email or web address] URL: [email or web address] Date: 10/11/2023 Prepared by: Kristeen Sar  Exercises - Supine Gluteus Stretch  - 1 x daily - 7 x weekly - 2 sets - 20-30 hold - Supine Hamstring Stretch with Strap  - 1 x daily - 7 x weekly - 2 sets - 20-30 hold - Supine ITB Stretch with Strap  - 1 x daily - 7 x weekly - 2 sets - 20-30 hold - Seated Long Arc Quad  - 1 x daily - 7 x weekly - 2 sets - 10 reps - 2 hold - Standing Hamstring Stretch on Chair  - 1 x daily - 7 x weekly - 1 sets - 3 reps - 30 sec hold - Quadricep Stretch with Chair and Counter Support  - 1 x daily - 7 x weekly - 1 sets - 3 reps - 30 sec hold - Seated Figure 4 Piriformis Stretch  - 1 x daily - 7 x weekly - 1 sets - 3 reps - 30 sed hold - Standing 3-Way Leg Reach with Resistance at Ankles and Unilateral Counter Support  - 1 x daily - 7 x weekly - 2 sets - 10 reps ASSESSMENT:  CLINICAL IMPRESSION: Re-certification and 10th visit progress note completed today. Rasmus has made great improvements since starting therapy. He has met all his short term goals and is on track to meet long term goals with POC extension. One of his bigger challenging is bending over to  tie his shoes. He wears slip on shoes majority of the time, and required assistance from his wife. Educated patient on the importance of performing his hip flexibility exercises to help with this. Noted improved times on his TUG, and improved function based on LEFS score. With 6 inch step up exercise patient occasionally catches his toe on the step due to decreased toe clearance. He still requires verbal cues for increased step length and toe clearance while ambulating. Patient would benefit from continued therapy to continue hip strengthening, single leg balance, and meet remaining goals    75% BETTER  OBJECTIVE IMPAIRMENTS: Abnormal gait, decreased balance, difficulty walking,  decreased ROM, decreased strength, increased muscle spasms, impaired flexibility, postural dysfunction, and pain.   ACTIVITY LIMITATIONS: carrying, bending, dressing, and locomotion level  PARTICIPATION LIMITATIONS: cleaning, laundry, community activity, school, and camping/ hiking  PERSONAL FACTORS: Age, Fitness, Time since onset of injury/illness/exacerbation, and 3+ comorbidities: Left femur fx May 2024; Type 2 DB;  peripheral neuropathy; HTN; are also affecting patient's functional outcome.   REHAB POTENTIAL: Good  CLINICAL DECISION MAKING: Stable/uncomplicated  EVALUATION COMPLEXITY: Moderate   GOALS: Goals reviewed with patient? Yes  SHORT TERM GOALS: Target date: 09/26/2023  Patient will be independent with initial HEP. Baseline:  Goal status: MET 10/18/2023  2.  Patient will be able to walk 0.5 miles without left him pain or discomfort. Baseline: increased pain and has to stop Goal status: MET 10/02/23  3.  Patient will ambulate > or = to 1,000 feet during for improved community ambulation. Baseline: 875ft Goal status: MET 10/18/2023    LONG TERM GOALS: Target date: 12/24/2023  Patient will demonstrate independence in advanced HEP. Baseline:  Goal status: IN PROGRESS 10/22/2023  2.  Patient  will be able to walk a mile with no left hip discomfort for improved cardiovascular endurance. Baseline: unable to walk 1 mile Goal status: MET 10/22/2023  3.  Patient will verbalize and demonstrate self-care strategies to manage pain including tissue mobility practices and change of position. Baseline:  Goal status: IN PROGRESS 10/22/2023  4.  Patient will be able to bend and put on his socks and shoes with no left hip discomfort. Baseline: 2/10 pain Goal status: IN PROGRESS (still challenging) 10/22/2023  5.  Patient will perform TUG in < or = to 9 sec to decrease risk of falls. Baseline: 12.27sec Goal status: MET 10/22/2023  6.  Patient will score 48/80 on LEFS due to improved function of Lt LE. Baseline: 38/80  Goal status: MET 10/22/2023    7.  Patient will ambulate > or = to 1173ft during to be within 60% of age expected norms for test and improve community negotiation.  Baseline: 1038ft  Goal status:NEW   PLAN:  PT FREQUENCY: 1-2x/week  PT DURATION: 6 weeks starting 11/12/2023  PLANNED INTERVENTIONS: 02835- PT Re-evaluation, 97110-Therapeutic exercises, 97530- Therapeutic activity, 97112- Neuromuscular re-education, 97535- Self Care, 02859- Manual therapy, [phone]- Gait training, [phone]- Canalith repositioning, [id number]- Aquatic Therapy, [phone]- Electrical stimulation (unattended), [phone]- Electrical stimulation (manual), [id number]- Ionotophoresis 4mg /ml Dexamethasone , 20560 (1-2 muscles), 20561 (3+ muscles)- Dry Needling, Patient/Family education, Balance training, Stair training, Taping, Joint mobilization, Joint manipulation, Spinal manipulation, Spinal mobilization, Vestibular training, Cryotherapy, and Moist heat  PLAN FOR NEXT SESSION: (auth requested) single leg strength & balance; continue hip strengthening & balance training    Kristeen Sar, PT 10/22/23 11:29 AM [facility] [address], [address] [place], [place] [address] Phone # [phone] Fax  [phone]

## 2023-10-22 NOTE — Telephone Encounter (Signed)
 Pt was returning call regarding tele pre op appt. Please advise

## 2023-10-22 NOTE — Telephone Encounter (Signed)
 LVM asking pt to call our office to schedule appt for preop clearance. Please contact preop team if pt returns call. Thank you.

## 2023-10-22 NOTE — Telephone Encounter (Signed)
 Patient to have tele clearance on 10/29/23 with Alejandro Finder, NP.   SABRA  Patient Consent for Virtual Visit        Alejandro HILLERY has provided verbal consent on 10/22/2023 for a virtual visit (video or telephone).   CONSENT FOR VIRTUAL VISIT FOR:  Alejandro Foster  By participating in this virtual visit I agree to the following:  I hereby voluntarily request, consent and authorize [facility] and its employed or contracted physicians, physician assistants, nurse practitioners or other licensed health care professionals (the Practitioner), to provide me with telemedicine health care services (the "Services) as deemed necessary by the treating Practitioner. I acknowledge and consent to receive the Services by the Practitioner via telemedicine. I understand that the telemedicine visit will involve communicating with the Practitioner through live audiovisual communication technology and the disclosure of certain medical information by electronic transmission. I acknowledge that I have been given the opportunity to request an in-person assessment or other available alternative prior to the telemedicine visit and am voluntarily participating in the telemedicine visit.  I understand that I have the right to withhold or withdraw my consent to the use of telemedicine in the course of my care at any time, without affecting my right to future care or treatment, and that the Practitioner or I may terminate the telemedicine visit at any time. I understand that I have the right to inspect all information obtained and/or recorded in the course of the telemedicine visit and may receive copies of available information for a reasonable fee.  I understand that some of the potential risks of receiving the Services via telemedicine include:  Delay or interruption in medical evaluation due to technological equipment failure or disruption; Information transmitted may not be sufficient (e.g. poor resolution of images)  to allow for appropriate medical decision making by the Practitioner; and/or  In rare instances, security protocols could fail, causing a breach of personal health information.  Furthermore, I acknowledge that it is my responsibility to provide information about my medical history, conditions and care that is complete and accurate to the best of my ability. I acknowledge that Practitioner's advice, recommendations, and/or decision may be based on factors not within their control, such as incomplete or inaccurate data provided by me or distortions of diagnostic images or specimens that may result from electronic transmissions. I understand that the practice of medicine is not an exact science and that Practitioner makes no warranties or guarantees regarding treatment outcomes. I acknowledge that a copy of this consent can be made available to me via my patient portal [facility] MyChart), or I can request a printed copy by calling the office of [facility].    I understand that my insurance will be billed for this visit.   I have read or had this consent read to me. I understand the contents of this consent, which adequately explains the benefits and risks of the Services being provided via telemedicine.  I have been provided ample opportunity to ask questions regarding this consent and the Services and have had my questions answered to my satisfaction. I give my informed consent for the services to be provided through the use of telemedicine in my medical care

## 2023-10-22 NOTE — Telephone Encounter (Signed)
 Pt returning call to nurse

## 2023-10-24 ENCOUNTER — Encounter: Payer: Self-pay | Admitting: Physical Therapy

## 2023-10-24 ENCOUNTER — Ambulatory Visit: Admitting: Physical Therapy

## 2023-10-24 DIAGNOSIS — M6281 Muscle weakness (generalized): Secondary | ICD-10-CM

## 2023-10-24 DIAGNOSIS — R2689 Other abnormalities of gait and mobility: Secondary | ICD-10-CM

## 2023-10-24 DIAGNOSIS — M25552 Pain in left hip: Secondary | ICD-10-CM | POA: Diagnosis not present

## 2023-10-24 NOTE — Therapy (Signed)
 OUTPATIENT PHYSICAL THERAPY LOWER EXTREMITY TREATMENT   Patient Name: Alejandro Foster MRN: [id number] DOB:1933-07-18, 89 y.o., male Today's Date: 10/24/2023  END OF SESSION:  PT End of Session - 10/24/23 1131     Visit Number 10    Date for PT Re-Evaluation 12/24/23    Authorization Type U[employer] Medicare [employer] approved 16 visits 08/29/2023 - 10/24/2023 [id number] (second shara has been submitted check appt notes)    Authorization Time Period 08/29/2023 - 10/24/2023    Authorization - Visit Number 10    Authorization - Number of Visits 16    Progress Note Due on Visit 19    PT Start Time 1021    PT Stop Time 1102    PT Time Calculation (min) 41 min    Activity Tolerance Patient tolerated treatment well    Behavior During Therapy WFL for tasks assessed/performed                  Past Medical History:  Diagnosis Date   Anemia    Arthritis    Hands   Bladder cancer (HCC)    Chronic bilateral thoracic back pain 12/12/2017   Complication of anesthesia    Hard time waking up in the past   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) 11/10/2019   Coronary artery disease    cardiologist-  dr blanca--  s/p  cardiac cath's w/ angioplasty and stenting x2   DDD (degenerative disc disease), lumbar    Depression    Diabetic peripheral neuropathy associated with type 2 diabetes mellitus (HCC) 12/12/2017   Dyslipidemia 01/25/2018   Dyspnea on exertion 08/22/2019   Foraminal stenosis of lumbar region 12/12/2017   GERD (gastroesophageal reflux disease)    History of kidney stones    Hyperlipidemia    Hypertension    Lumbar radiculopathy, right    right leg weakness   Myofascial pain syndrome 12/12/2017   Nephrolithiasis    bilateral non-obstructive per ct 01-19-2016   Peripheral neuropathy    feet   Posterior vitreous detachment of both eyes 11/10/2019   Pseudophakia of both eyes 11/10/2019   Right leg weakness    due to lumbar  radiculopathy   Right ureteral stone    S/P coronary artery stent placement    2002 x1  and 2003 x1   Spondylosis without myelopathy or radiculopathy, lumbar region 12/12/2017   Trigger finger 05/28/2018   Type 2 diabetes mellitus (HCC)    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) 11/10/2019   Wears hearing aid    BILATERAL   Past Surgical History:  Procedure Laterality Date   BIOPSY  07/11/2022   Procedure: BIOPSY;  Surgeon: Saintclair Jasper, MD;  Location: [facility] ENDOSCOPY;  Service: Gastroenterology;;   BOTOX  INJECTION N/A 03/20/2023   Procedure: BOTOX  INJECTION;  Surgeon: Saintclair Jasper, MD;  Location: [facility] ENDOSCOPY;  Service: Gastroenterology;  Laterality: N/A;   CARDIAC CATHETERIZATION  01/ 2002   dr blanca   mLAD 30%,  CFX OM3 40%,  RCA 30% previous stent site   CARDIOVASCULAR STRESS TEST  04-15-2009  dr blanca   normal nuclear study w/ no ischemia/  normal LV function and wall motion , ef 77%   CATARACT EXTRACTION W/ INTRAOCULAR LENS  IMPLANT, BILATERAL  2013  approx.   COLONOSCOPY WITH PROPOFOL   2014   CORONARY ANGIOPLASTY  1990  dr blanca   PTCA to OM2   CORONARY ANGIOPLASTY  10/ 1996  dr blanca   PTCA to RCA   CORONARY ANGIOPLASTY WITH STENT PLACEMENT  04/ 1996  dr blanca   stenting to RCA   CYSTOSCOPY WITH URETEROSCOPY AND STENT PLACEMENT Right 03/28/2016   Procedure: CYSTOSCOPY WITH RIGHT  URETEROSCOPY AND STENT PLACEMENT;  Surgeon: Donnice Brooks, MD;  Location: [facility];  Service: Urology;  Laterality: Right;   ESOPHAGOGASTRODUODENOSCOPY (EGD) WITH PROPOFOL  N/A 07/11/2022   Procedure: ESOPHAGOGASTRODUODENOSCOPY (EGD) WITH PROPOFOL ;  Surgeon: Saintclair Jasper, MD;  Location: [facility] ENDOSCOPY;  Service: Gastroenterology;  Laterality: N/A;   ESOPHAGOGASTRODUODENOSCOPY (EGD) WITH PROPOFOL  N/A 03/20/2023   Procedure: ESOPHAGOGASTRODUODENOSCOPY (EGD) WITH PROPOFOL ;  Surgeon: Saintclair Jasper, MD;  Location: [facility] ENDOSCOPY;   Service: Gastroenterology;  Laterality: N/A;  with botox  injection s   EXTRACORPOREAL SHOCK WAVE LITHOTRIPSY  yrs ago   HOLMIUM LASER APPLICATION Right 03/28/2016   Procedure: HOLMIUM LASER APPLICATION;  Surgeon: Donnice Brooks, MD;  Location: [facility];  Service: Urology;  Laterality: Right;   INTRAMEDULLARY (IM) NAIL INTERTROCHANTERIC Left 07/07/2022   Procedure: INTRAMEDULLARY (IM) NAILING OF LEFT FEMUR;  Surgeon: Jerri Kay HERO, MD;  Location: [facility] OR;  Service: Orthopedics;  Laterality: Left;   LAPAROSCOPIC CHOLECYSTECTOMY  1990's   SUBMUCOSAL INJECTION  07/11/2022   Procedure: SUBMUCOSAL INJECTION;  Surgeon: Saintclair Jasper, MD;  Location: [facility] ENDOSCOPY;  Service: Gastroenterology;;   TONSILLECTOMY AND ADENOIDECTOMY  child   URETEROLITHOTOMY  1990's   Patient Active Problem List   Diagnosis Date Noted   Fall 07/06/2022   Displaced intertrochanteric fracture of left femur, initial encounter for closed fracture (HCC) 07/06/2022   CAD (coronary artery disease) 07/06/2022   Coronary artery disease 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) 05/12/2022   Paving stone retinal degeneration, bilateral 11/03/2021   Senile purpura (HCC) 09/07/2020   Claudication (HCC) 03/17/2020   Wears hearing aid    Type 2 diabetes mellitus (HCC)    S/P coronary artery stent placement    Right ureteral stone    Right leg weakness    Lumbar radiculopathy, right    History of kidney stones    DDD (degenerative disc disease), lumbar    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) 11/10/2019   Pseudophakia of both eyes 11/10/2019   Posterior vitreous detachment of both eyes 11/10/2019   Amnesia 09/19/2019   Dyspnea on exertion 08/22/2019   Atypical chest pain 08/22/2019   Diabetic retinopathy associated with type 2 diabetes mellitus (HCC) 03/27/2019   Trigger finger 05/28/2018   Dyslipidemia 01/25/2018   Diabetic  peripheral neuropathy associated with type 2 diabetes mellitus (HCC) 12/12/2017   Chronic bilateral thoracic back pain 12/12/2017   Myofascial pain syndrome 12/12/2017   Foraminal stenosis of lumbar region 12/12/2017   Spondylosis without myelopathy or radiculopathy, lumbar region 12/12/2017   Cough 09/17/2017   Irritability and anger 09/17/2017   Localized edema 08/01/2017   Abnormal gait 01/09/2017   Fatigue 09/12/2016   Hypertensive heart disease without congestive heart failure 09/03/2014   Long term (current) use of insulin  (HCC) 09/03/2014   Ventricular premature depolarization 09/03/2014   Encounter for general adult medical examination without abnormal findings 08/26/2014   Gout 08/28/2013   Impacted cerumen 08/28/2013   Hypo-osmolality and hyponatremia 08/28/2013   Urge incontinence of urine 08/26/2012   Age-related osteoporosis without current pathological fracture 02/19/2012   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   Vitamin D deficiency 02/19/2012   Underimmunization status 12/26/2011  Backache 08/21/2011   Hearing loss 05/23/2011   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) 03/22/2010   Kidney stone 05/25/2009   ED (erectile dysfunction) of organic origin 05/25/2009   Obesity 05/25/2009   Metabolic syndrome 05/25/2009   Polyneuropathy 12/25/2008   Hyperglycemia due to type 2 diabetes mellitus (HCC) 12/25/2008   Gastro-esophageal reflux disease without esophagitis 12/25/2008   Insomnia 12/25/2008   Dilatation of aorta (HCC) 10/02/2008   Essential hypertension 10/02/2008   Hyperlipidemia 10/02/2008    PCP: Onita Rush, MD   REFERRING PROVIDER: Jerri Kay HERO, MD  REFERRING DIAG: [phone] (ICD-10-CM) - Displaced intertrochanteric fracture of left femur, initial encounter for closed fracture [facility])  THERAPY DIAG:  Pain in left hip  Muscle weakness (generalized)  Other abnormalities of gait and mobility  Rationale for Evaluation  and Treatment: Rehabilitation  ONSET DATE: Jul 19 2022  SUBJECTIVE:   SUBJECTIVE STATEMENT: Patient denies pain today. He just has some soreness  From Eval: Patient presents with left hip pain that began when he broke his femur Jul 19 2022.  He was in the hospital and did PT at [facility]. He has been traveling and he has gain some weight and he feels he is limping more. Patient reports he had imaging done today and every has healed fine there is just some inflammation. It has been challenging to putting on his shoes, bending, and he feels his balance is off. Previously he was able to walk a mile and now he is limited to 0.5 miles.   PERTINENT HISTORY: Left femur fx May 2024; Type 2 DB; lumbar DDD; peripheral neuropathy; HTN; PAIN:  10/24/23 Are you having pain? Yes: NPRS scale: 0/ 10 Pain location: lateral femur; where his scares or located Pain description: constant soreness; achy Aggravating factors: bending, walking Relieving factors: Tylenol    PRECAUTIONS: None  RED FLAGS: None   WEIGHT BEARING RESTRICTIONS: No  FALLS:  Has patient fallen in last 6 months? Yes. Number of falls 1 he fell in the hospital walking with his walker  LIVING ENVIRONMENT: Lives with: lives with their spouse Lives in: House/apartment Stairs: Yes: Internal: 12 steps; on left going up Has following equipment at home: Walking cane  OCCUPATION: Retired Nutritional therapist  PLOF: Independent, Independent with basic ADLs, Independent with household mobility without device, Independent with community mobility without device, Independent with gait, and Independent with transfers  PATIENT GOALS: To be able to go camping again and be about to walk without a limp  NEXT MD VISIT: PRN  OBJECTIVE:  Note: Objective measures were completed at Evaluation unless otherwise noted.  DIAGNOSTIC FINDINGS: No final results yet  PATIENT SURVEYS:  LEFS: 38/80 47.5%  10/22/2023 LEFS: 56/80 70%  COGNITION: Overall  cognitive status: Within functional limits for tasks assessed     SENSATION: WFL   MUSCLE LENGTH: Hamstrings: limited bilateral    POSTURE: rounded shoulders and forward head  PALPATION: Tenderness to palpation Lt TFL, glutes, and quads  LOWER EXTREMITY ROM: WFL; limited Lt hip IR/ER   LOWER EXTREMITY MMT:  MMT Right eval Left eval  Hip flexion 4+ 4-*  Hip extension    Hip abduction 4+ 4-  Hip adduction    Hip internal rotation    Hip external rotation    Knee flexion 4+ 4+  Knee extension 4+ 4shaky  Ankle dorsiflexion    Ankle plantarflexion    Ankle inversion    Ankle eversion     (Blank rows = not  tested)    FUNCTIONAL TESTS:  5 times sit to stand: 10.67 sec no UE support Timed up and go (TUG): 12.27 6 minute walk test: 892FT ; groin pain after 5 mins;   10/18/2023 1059ft . Had some left hip soreness after 4: 30  10/22/2023 TUG: 8.26ft GAIT: Distance walked: 892 FT Assistive device utilized: None Level of assistance: Complete Independence Comments: decreased step length on Rt; decreased stance time on Lt ; antaglic gait                                                                                                                                TREATMENT DATE:  10/24/2023 NuStep Level 5 6 mins- PT present to discuss status Side stepping in // with red TB around ankles x4 laps 8 inch step ups holding 10# DB unilateral x 10 bilateral  8 inch lateral step ups holding 10# DB unilateral x 10 bilateral  Sit to stand + chest press holding 10 # 2 x10 Leg Press 95# bilateral 2 x 10; 85# (Rt ) 70# (Lt)  x 20 bilateral (seat 7) 10# DB waist height + march x 20 marches each hand Resisted walking with belt 10# (forwards backwards & sideways) x 6 laps each Lunges to BOSU x 12 bilateral      10/22/2023 NuStep Level 5 5 mins- PT present to discuss status Goal Assessment LEFS: 56/80 70% TUG 8.88 sec Resisted walking with belt 10# (backwards & sideways) x  5 laps each Sit to stand holding 7 # 2 x10 Leg Press 95# bilateral 2 x 10; 60# unilateral x 20 bilateral  6in Step ups at stair holding unilateral 8# DB x 12 bilateral (forwards) 6in lateral step ups unilateral 8# DB hold x 12 bilateral  Marching on airex no UE support x 20 Heel raised on airex x 20 Discussion of flexibility exercises he can do to help him touch his toes to help tie his shoes.    10/18/2023 NuStep Level 6 5 mins- PT present to discuss status 1054ft . Had some left hip soreness after 4: 30 Leg Press 95# bilateral 2 x 10; 45# unilateral (Lt ); 55# (Rt) x 20 (seat 6) Resisted walking with belt 10# (backwards & sideways) x 5 laps each Step ups at stair holding unilateral 8# DB x 15 bilateral  Discussion of POC plan to do(1x week 6 weeks starting 8/25. Patient will be having a minor procedure) Standing hamstring stretch at stair 2 x 30 sec bilateral    10/16/2023 NuStep Level 6 5 mins- PT present to discuss status Green TB around ankles (lateral step & backwards step) 2 x 10 bilateral  Side stepping with green TB around ankles at barre x 4 laps 6in step ups holding two 8# DB x 12 bilateral (SBA)- forwards and lateral Single leg on airex + cone tap (cone forwards & lateral) Single leg cone tap (cone infront and lateral) standing on  airex x 8 taps each leg min- mod assist at barre Discussion & demonstration of correct technique when lifting objects from floor, safe transfer from ground Single leg lunge holding 8# DB x 10 bilateral  Leg Press 95# bilateral 2 x 10; 45# unilateral (Lt ); 55# (Rt) x 20 Hurdles (forwards & lateral) step over pattern x 2 laps each no UE support Bilateral farmer's carry (around both gyms) 8# x 1laps      PATIENT EDUCATION:  Education details: Ice; POC; examination findings; HEP Person educated: Patient Education method: Explanation, Demonstration, and Handouts Education comprehension: verbalized understanding, returned demonstration,  and needs further education  HOME EXERCISE PROGRAM: Access Code: [email or web address] URL: [email or web address] Date: 10/11/2023 Prepared by: Kristeen Sar  Exercises - Supine Gluteus Stretch  - 1 x daily - 7 x weekly - 2 sets - 20-30 hold - Supine Hamstring Stretch with Strap  - 1 x daily - 7 x weekly - 2 sets - 20-30 hold - Supine ITB Stretch with Strap  - 1 x daily - 7 x weekly - 2 sets - 20-30 hold - Seated Long Arc Quad  - 1 x daily - 7 x weekly - 2 sets - 10 reps - 2 hold - Standing Hamstring Stretch on Chair  - 1 x daily - 7 x weekly - 1 sets - 3 reps - 30 sec hold - Quadricep Stretch with Chair and Counter Support  - 1 x daily - 7 x weekly - 1 sets - 3 reps - 30 sec hold - Seated Figure 4 Piriformis Stretch  - 1 x daily - 7 x weekly - 1 sets - 3 reps - 30 sed hold - Standing 3-Way Leg Reach with Resistance at Ankles and Unilateral Counter Support  - 1 x daily - 7 x weekly - 2 sets - 10 reps ASSESSMENT:  CLINICAL IMPRESSION: Kayveon present to skilled therapy with no increased pain but he feels some soreness in his left hip. Progresses weight and stair height for step up exercise today. Did not note any instances of unbalance or patient catching his toe on the step. With side stepping patient required verbal cues to improve right leg clearance. Progressed single leg balance activities today. Exercises on the BOSU was a good challenge for patient. There will be a small lapse in treatment due to patient having a minor procedure soon.    75% BETTER  OBJECTIVE IMPAIRMENTS: Abnormal gait, decreased balance, difficulty walking, decreased ROM, decreased strength, increased muscle spasms, impaired flexibility, postural dysfunction, and pain.   ACTIVITY LIMITATIONS: carrying, bending, dressing, and locomotion level  PARTICIPATION LIMITATIONS: cleaning, laundry, community activity, school, and camping/ hiking  PERSONAL FACTORS: Age, Fitness, Time since onset of injury/illness/exacerbation,  and 3+ comorbidities: Left femur fx May 2024; Type 2 DB;  peripheral neuropathy; HTN; are also affecting patient's functional outcome.   REHAB POTENTIAL: Good  CLINICAL DECISION MAKING: Stable/uncomplicated  EVALUATION COMPLEXITY: Moderate   GOALS: Goals reviewed with patient? Yes  SHORT TERM GOALS: Target date: 09/26/2023  Patient will be independent with initial HEP. Baseline:  Goal status: MET 10/18/2023  2.  Patient will be able to walk 0.5 miles without left him pain or discomfort. Baseline: increased pain and has to stop Goal status: MET 10/02/23  3.  Patient will ambulate > or = to 1,000 feet during for improved community ambulation. Baseline: 842ft Goal status: MET 10/18/2023    LONG TERM GOALS: Target date: 12/24/2023  Patient will demonstrate independence in advanced HEP. Baseline:  Goal status: IN PROGRESS 10/22/2023  2.  Patient will be able to walk a mile with no left hip discomfort for improved cardiovascular endurance. Baseline: unable to walk 1 mile Goal status: MET 10/22/2023  3.  Patient will verbalize and demonstrate self-care strategies to manage pain including tissue mobility practices and change of position. Baseline:  Goal status: IN PROGRESS 10/22/2023  4.  Patient will be able to bend and put on his socks and shoes with no left hip discomfort. Baseline: 2/10 pain Goal status: IN PROGRESS (still challenging) 10/22/2023  5.  Patient will perform TUG in < or = to 9 sec to decrease risk of falls. Baseline: 12.27sec Goal status: MET 10/22/2023  6.  Patient will score 48/80 on LEFS due to improved function of Lt LE. Baseline: 38/80  Goal status: MET 10/22/2023    7.  Patient will ambulate > or = to 1145ft during to be within 60% of age expected norms for test and improve community negotiation.  Baseline: 1041ft  Goal status:NEW   PLAN:  PT FREQUENCY: 1-2x/week  PT DURATION: 6 weeks starting 11/12/2023  PLANNED INTERVENTIONS: 02835- PT  Re-evaluation, 97110-Therapeutic exercises, 97530- Therapeutic activity, 97112- Neuromuscular re-education, 97535- Self Care, 02859- Manual therapy, [phone]- Gait training, [phone]- Canalith repositioning, [id number]- Aquatic Therapy, [phone]- Electrical stimulation (unattended), [phone]- Electrical stimulation (manual), [id number]- Ionotophoresis 4mg /ml Dexamethasone , 79439 (1-2 muscles), [phone] (3+ muscles)- Dry Needling, Patient/Family education, Balance training, Stair training, Taping, Joint mobilization, Joint manipulation, Spinal manipulation, Spinal mobilization, Vestibular training, Cryotherapy, and Moist heat  PLAN FOR NEXT SESSION: (auth requested check for next visit) single leg strength & balance; continue hip strengthening & balance training    Kristeen Sar, PT 10/24/23 11:33 AM [facility] [address], [address] [place], [place] [address] Phone # [phone] Fax [phone]

## 2023-10-29 ENCOUNTER — Telehealth (HOSPITAL_BASED_OUTPATIENT_CLINIC_OR_DEPARTMENT_OTHER): Payer: Self-pay | Admitting: Family

## 2023-10-29 ENCOUNTER — Telehealth (INDEPENDENT_AMBULATORY_CARE_PROVIDER_SITE_OTHER): Admitting: Family

## 2023-10-29 ENCOUNTER — Encounter (HOSPITAL_BASED_OUTPATIENT_CLINIC_OR_DEPARTMENT_OTHER): Payer: Self-pay | Admitting: Family

## 2023-10-29 VITALS — BP 150/77 | HR 77 | Wt 180.0 lb

## 2023-10-29 DIAGNOSIS — R002 Palpitations: Secondary | ICD-10-CM

## 2023-10-29 DIAGNOSIS — E782 Mixed hyperlipidemia: Secondary | ICD-10-CM

## 2023-10-29 DIAGNOSIS — Z01810 Encounter for preprocedural cardiovascular examination: Secondary | ICD-10-CM | POA: Diagnosis not present

## 2023-10-29 DIAGNOSIS — I10 Essential (primary) hypertension: Secondary | ICD-10-CM | POA: Diagnosis not present

## 2023-10-29 DIAGNOSIS — I25118 Atherosclerotic heart disease of native coronary artery with other forms of angina pectoris: Secondary | ICD-10-CM

## 2023-10-29 DIAGNOSIS — R0609 Other forms of dyspnea: Secondary | ICD-10-CM

## 2023-10-29 NOTE — Telephone Encounter (Signed)
 Patient called 4x for preop virtual visit between myself and support staff. Phone goes straight to VM. Left VM requesting call back.   Tyshauna Finkbiner S Briley Bumgarner, NP

## 2023-10-29 NOTE — Patient Instructions (Signed)
 Medication Instructions:  Continue your current medications.   *If you need a refill on your cardiac medications before your next appointment, please call your pharmacy*  Testing/Procedures: Your physician has recommended that you wear a Zio monitor. This has been mailed to your home.   This monitor is a medical device that records the heart's electrical activity. Doctors most often use these monitors to diagnose arrhythmias. Arrhythmias are problems with the speed or rhythm of the heartbeat. The monitor is a small device applied to your chest. You can wear one while you do your normal daily activities. While wearing this monitor if you have any symptoms to push the button and record what you felt. Once you have worn this monitor for the period of time provider prescribed (Usually 14 days), you will return the monitor device in the postage paid box. Once it is returned they will download the data collected and provide us  with a report which the provider will then review and we will call you with those results. Important tips:  Avoid showering during the first 24 hours of wearing the monitor. Avoid excessive sweating to help maximize wear time. Do not submerge the device, no hot tubs, and no swimming pools. Keep any lotions or oils away from the patch. After 24 hours you may shower with the patch on. Take brief showers with your back facing the shower head.  Do not remove patch once it has been placed because that will interrupt data and decrease adhesive wear time. Push the button when you have any symptoms and write down what you were feeling. Once you have completed wearing your monitor, remove and place into box which has postage paid and place in your outgoing mailbox.  If for some reason you have misplaced your box then call our office and we can provide another box and/or mail it off for you.  Follow-Up: At [facility], you and your health needs are our priority.  As part of our  continuing mission to provide you with exceptional heart care, our providers are all part of one team.  This team includes your primary Cardiologist (physician) and Advanced Practice Providers or APPs (Physician Assistants and Nurse Practitioners) who all work together to provide you with the care you need, when you need it.  Your next appointment:   September 29th at 8:45 AM with Dr. Elmira  We recommend signing up for the patient portal called MyChart.  Sign up information is provided on this After Visit Summary.  MyChart is used to connect with patients for Virtual Visits (Telemedicine).  Patients are able to view lab/test results, encounter notes, upcoming appointments, etc.  Non-urgent messages can be sent to your provider as well.   To learn more about what you can do with MyChart, go to [email or web address].   Other Instructions  We have ordered a heart monitor to evaluate possible atrial fibrillation noted by your Apple Watch. Recommend holding off on surgery until monitor results obtained.

## 2023-10-29 NOTE — Progress Notes (Signed)
 Virtual Visit via Telephone Note   Because of Alejandro Foster's co-morbid illnesses, he is at least at moderate risk for complications without adequate follow up.  This format is felt to be most appropriate for this patient at this time.  The patient did not have access to video technology/had technical difficulties with video requiring transitioning to audio format only (telephone).  All issues noted in this document were discussed and addressed.  No physical exam could be performed with this format.  Please refer to the patient's chart for his consent to telehealth for [facility].    Date:  10/29/2023   ID:  Alejandro Foster, DOB [birth year], MRN [id number] The patient was identified using 2 identifiers.  Patient Location: Home Provider Location: Home Office   PCP:  Onita Rush, MD   [facility] Providers Cardiologist:  Newman JINNY Lawrence, MD     Evaluation Performed:  Follow-Up Visit  Chief Complaint:  Preop clearance  History of Present Illness:    Alejandro Foster is a 89 y.o. male with hypertension, hyperlipidemia, DM 2, CAD s/p PCI 1986, mild PAD, dysphagia, mild anemia. Last seen 04/13/2023 by Dr. Lawrence.  He was admitted 06/2022 with severe esophagitis and recommended to avoid NSAIDs.  No clear direction regarding discontinuation of aspirin . At visit 04/13/23 he was recommended to resume aspirin  81 mg daily.  Due to uncontrolled LDL atorvastatin  increased from 20 mg to 40 mg.  Metoprolol  tartrate discontinued due to absence of angina.  Presents today for preoperative clearance for direct visual internal urethrotomy and dilation scheduled for 11/02/23.  He notes he is considering canceling the procedure as he is not certain if he needs it quite yet.  He notes stable exertional dyspnea which he attributes to being out of shape and has continued to work with physical therapy after hip fracture.  He walks 30 minutes around his neighborhood a few times per week.  He did  recently have Apple Watch alert him twice to possible atrial fibrillation.  His ordered Crist which arrives today. Reports no known history of atrial fibrillation nor family history. BP elevated today but reports home readings most often 130s/60s-70s with reading such as 139/69 at recent preop visit.   Past Medical History:  Diagnosis Date   Anemia    Arthritis    Hands   Bladder cancer (HCC)    Chronic bilateral thoracic back pain 12/12/2017   Complication of anesthesia    Hard time waking up in the past   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) 11/10/2019   Coronary artery disease    cardiologist-  dr blanca--  s/p  cardiac cath's w/ angioplasty and stenting x2   DDD (degenerative disc disease), lumbar    Depression    Diabetic peripheral neuropathy associated with type 2 diabetes mellitus (HCC) 12/12/2017   Dyslipidemia 01/25/2018   Dyspnea on exertion 08/22/2019   Foraminal stenosis of lumbar region 12/12/2017   GERD (gastroesophageal reflux disease)    History of kidney stones    Hyperlipidemia    Hypertension    Lumbar radiculopathy, right    right leg weakness   Myofascial pain syndrome 12/12/2017   Nephrolithiasis    bilateral non-obstructive per ct 01-19-2016   Peripheral neuropathy    feet   Posterior vitreous detachment of both eyes 11/10/2019   Pseudophakia of both eyes 11/10/2019   Right leg weakness    due to lumbar radiculopathy  Right ureteral stone    S/P coronary artery stent placement    2002 x1  and 2003 x1   Spondylosis without myelopathy or radiculopathy, lumbar region 12/12/2017   Trigger finger 05/28/2018   Type 2 diabetes mellitus (HCC)    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) 11/10/2019   Wears hearing aid    BILATERAL   Past Surgical History:  Procedure Laterality Date   BIOPSY  07/11/2022   Procedure: BIOPSY;  Surgeon:  Saintclair Jasper, MD;  Location: [facility] ENDOSCOPY;  Service: Gastroenterology;;   BOTOX  INJECTION N/A 03/20/2023   Procedure: BOTOX  INJECTION;  Surgeon: Saintclair Jasper, MD;  Location: [facility] ENDOSCOPY;  Service: Gastroenterology;  Laterality: N/A;   CARDIAC CATHETERIZATION  01/ 2002   dr blanca   mLAD 30%,  CFX OM3 40%,  RCA 30% previous stent site   CARDIOVASCULAR STRESS TEST  04-15-2009  dr blanca   normal nuclear study w/ no ischemia/  normal LV function and wall motion , ef 77%   CATARACT EXTRACTION W/ INTRAOCULAR LENS  IMPLANT, BILATERAL  2013  approx.   COLONOSCOPY WITH PROPOFOL   2014   CORONARY ANGIOPLASTY  1990  dr blanca   PTCA to OM2   CORONARY ANGIOPLASTY  10/ 1996  dr blanca   PTCA to RCA   CORONARY ANGIOPLASTY WITH STENT PLACEMENT  04/ 1996  dr blanca   stenting to RCA   CYSTOSCOPY WITH URETEROSCOPY AND STENT PLACEMENT Right 03/28/2016   Procedure: CYSTOSCOPY WITH RIGHT  URETEROSCOPY AND STENT PLACEMENT;  Surgeon: Donnice Brooks, MD;  Location: [facility];  Service: Urology;  Laterality: Right;   ESOPHAGOGASTRODUODENOSCOPY (EGD) WITH PROPOFOL  N/A 07/11/2022   Procedure: ESOPHAGOGASTRODUODENOSCOPY (EGD) WITH PROPOFOL ;  Surgeon: Saintclair Jasper, MD;  Location: [facility] ENDOSCOPY;  Service: Gastroenterology;  Laterality: N/A;   ESOPHAGOGASTRODUODENOSCOPY (EGD) WITH PROPOFOL  N/A 03/20/2023   Procedure: ESOPHAGOGASTRODUODENOSCOPY (EGD) WITH PROPOFOL ;  Surgeon: Saintclair Jasper, MD;  Location: [facility] ENDOSCOPY;  Service: Gastroenterology;  Laterality: N/A;  with botox  injection s   EXTRACORPOREAL SHOCK WAVE LITHOTRIPSY  yrs ago   HOLMIUM LASER APPLICATION Right 03/28/2016   Procedure: HOLMIUM LASER APPLICATION;  Surgeon: Donnice Brooks, MD;  Location: [facility];  Service: Urology;  Laterality: Right;   INTRAMEDULLARY (IM) NAIL INTERTROCHANTERIC Left 07/07/2022   Procedure: INTRAMEDULLARY (IM) NAILING OF LEFT FEMUR;  Surgeon: Jerri Kay HERO, MD;  Location: [facility] OR;  Service: Orthopedics;   Laterality: Left;   LAPAROSCOPIC CHOLECYSTECTOMY  1990's   SUBMUCOSAL INJECTION  07/11/2022   Procedure: SUBMUCOSAL INJECTION;  Surgeon: Saintclair Jasper, MD;  Location: [facility] ENDOSCOPY;  Service: Gastroenterology;;   TONSILLECTOMY AND ADENOIDECTOMY  child   URETEROLITHOTOMY  1990's     Current Meds  Medication Sig   acetaminophen  (TYLENOL ) 325 MG tablet Take 2 tablets (650 mg total) by mouth every 4 (four) hours as needed for mild pain or moderate pain. Gel caps   amLODipine  (NORVASC ) 5 MG tablet Take 5 mg by mouth every evening.    aspirin  EC 81 MG tablet Take 1 tablet (81 mg total) by mouth daily. Swallow whole.   atorvastatin  (LIPITOR) 40 MG tablet Take 1 tablet (40 mg total) by mouth daily.   Cholecalciferol (VITAMIN D PO) Take 5 drops by mouth every evening.   DULoxetine  (CYMBALTA ) 30 MG capsule Take 30 mg by mouth daily.   fenofibrate (TRICOR) 48 MG tablet Take 48 mg by mouth every evening.    gabapentin  (NEURONTIN ) 100 MG capsule  Take 200 mg by mouth 3 (three) times daily.   glipiZIDE  (GLUCOTROL ) 5 MG tablet Take 5 mg by mouth daily.   HUMALOG  MIX 75/25 KWIKPEN (75-25) 100 UNIT/ML Kwikpen Inject 30-48 Units into the skin See admin instructions. Inject 48 units subcutaneously in the morning and then take 30 units at night   hydrochlorothiazide  (HYDRODIURIL ) 25 MG tablet Take 25 mg by mouth every morning.    Magnesium  Citrate 100 MG TABS Take 200 mg by mouth daily.   pantoprazole  (PROTONIX ) 40 MG tablet Take 1 tablet (40 mg total) by mouth 2 (two) times daily before a meal.   predniSONE  (STERAPRED UNI-PAK 21 TAB) 10 MG (21) TBPK tablet Take as directed   ramipril  (ALTACE ) 10 MG capsule Take 20 mg by mouth daily.   solifenacin (VESICARE) 5 MG tablet Take 5 mg by mouth every morning.    tamsulosin (FLOMAX) 0.4 MG CAPS capsule Take 0.4 mg by mouth daily.   zolpidem  (AMBIEN ) 10 MG tablet Take 1 tablet (10 mg total) by mouth at bedtime.     Allergies:   Sulfa antibiotics   Social History    Tobacco Use   Smoking status: Former    Current packs/day: 0.00    Average packs/day: 1 pack/day for 40.0 years (40.0 ttl pk-yrs)    Types: Cigarettes    Start date: 08/26/1948    Quit date: 08/26/1988    Years since quitting: 35.1   Smokeless tobacco: Never  Vaping Use   Vaping status: Never Used  Substance Use Topics   Alcohol use: Yes    Comment: rare   Drug use: No     Family Hx: The patient's family history includes Cancer in his brother; Gallbladder disease in his father.  ROS:   Please see the history of present illness.     All other systems reviewed and are negative.   Prior CV studies:   The following studies were reviewed today:  Cardiac Studies & Procedures   ______________________________________________________________________________________________   STRESS TESTS  PCV MYOCARDIAL PERFUSION WITH LEXISCAN  06/19/2022  Interpretation Summary Regadenoson  (with Mod Bruce protocol) Nuclear stress test 06/19/2022 Myocardial perfusion is normal. Diaphragmatic attenuation noted in the inferior wall. Overall LV systolic function is normal without regional wall motion abnormalities. Stress LV EF: 56%. Low risk study. Nondiagnostic ECG stress. The heart rate response was consistent with Regadenoson . The blood pressure response was physiologic. No previous exam available for comparison.   ECHOCARDIOGRAM  PCV ECHOCARDIOGRAM COMPLETE 05/10/2022  Narrative Echocardiogram 05/10/2022: Mildly depressed LV systolic function with visual EF 40-45%. Left ventricle cavity is normal in size. Eccentric hypertrophy of the left ventricle. Hypokinetic global wall motion. Doppler evidence of grade I (impaired) diastolic dysfunction. Calculated EF 42%. Structurally normal tricuspid valve with trace regurgitation. No evidence of pulmonary hypertension. No prior available for comparison.           ______________________________________________________________________________________________       Labs/Other Tests and Data Reviewed:    EKG:  No ECG reviewed.  Recent Labs: 10/15/2023: BUN 19; Creatinine, Ser 0.91; Hemoglobin 13.6; Platelets 252; Potassium 4.1; Sodium 138   Recent Lipid Panel No results found for: CHOL, TRIG, HDL, CHOLHDL, LDLCALC, LDLDIRECT  Wt Readings from Last 3 Encounters:  10/29/23 180 lb (81.6 kg)  10/15/23 185 lb 6.4 oz (84.1 kg)  04/13/23 187 lb (84.8 kg)     Risk Assessment/Calculations:          Objective:    Vital Signs:  BP (!) 150/77   Pulse 77  Wt 180 lb (81.6 kg)   BMI 28.19 kg/m    VITAL SIGNS:  reviewed  ASSESSMENT & PLAN:    Preoperative clearance - Able to achieve >4 METS. However with possible new onset atrial fibrillation by Apple Watch, plan for further evaluation prior to providing clearance. 14 day ZIO mailed to patient. follow up in clinic with Dr. Susana in 4-6 weeks. Defer clearance until follow up with Dr. Collis. Alejandro Foster is agreeable as noted he planned to delay procedure regardless.  Antiplatelet/Anticoagulant: Regarding ASA therapy, we recommend continuation of ASA throughout the perioperative period. However, if the surgeon feels that cessation of ASA is required in the perioperative period, it may be stopped 5-7 days prior to surgery with a plan to resume it as soon as felt to be feasible from a surgical standpoint in the post-operative period.   CAD / HLD, LDL goal <70 - Stable with no anginal symptoms. No indication for ischemic evaluation.  Exertional dyspnea attributed to gradually increasing activity after hip fracture.  GDMT includes aspirin  81 mg daily, atorvastatin  40 mg daily. Recommend aiming for 150 minutes of moderate intensity activity per week and following a heart healthy diet.    DM2 - Continue to follow with PCP.    Time:   Today, I have spent 10 minutes with the patient  with telehealth technology discussing the above problems.     Medication Adjustments/Labs and Tests Ordered: Current medicines are reviewed at length with the patient today.  Concerns regarding medicines are outlined above.   Tests Ordered: Orders Placed This Encounter  Procedures   LONG TERM MONITOR (3-14 DAYS)    Medication Changes: No orders of the defined types were placed in this encounter.   Follow Up:  In Person in September scheduled with Dr.Patwardhan to review monitor  Signed, Reche GORMAN Finder, NP  10/29/2023 10:33 AM    [facility]

## 2023-11-01 ENCOUNTER — Telehealth: Payer: Self-pay | Admitting: Cardiology

## 2023-11-01 NOTE — Telephone Encounter (Signed)
 Spoke with pt regarding his heart monitor. Pt stated he was told in clinic the heart monitor would come in the mail in 2 days. Pt was told it takes more like 3-7 days to come in the mail. Pt verbalized understanding. All questions if any were answered.

## 2023-11-01 NOTE — Telephone Encounter (Signed)
 Patient calling in about his heart monitor. Please advise

## 2023-11-02 ENCOUNTER — Encounter (HOSPITAL_COMMUNITY): Admission: RE | Payer: Self-pay | Source: Ambulatory Visit

## 2023-11-02 ENCOUNTER — Ambulatory Visit (HOSPITAL_COMMUNITY): Admission: RE | Admit: 2023-11-02 | Source: Ambulatory Visit | Admitting: Urology

## 2023-11-02 SURGERY — CYSTOSCOPY, WITH DIRECT VISION INTERNAL URETHROTOMY
Anesthesia: General

## 2023-11-13 ENCOUNTER — Ambulatory Visit

## 2023-11-13 DIAGNOSIS — R262 Difficulty in walking, not elsewhere classified: Secondary | ICD-10-CM

## 2023-11-13 DIAGNOSIS — R252 Cramp and spasm: Secondary | ICD-10-CM

## 2023-11-13 DIAGNOSIS — R293 Abnormal posture: Secondary | ICD-10-CM

## 2023-11-13 DIAGNOSIS — M6281 Muscle weakness (generalized): Secondary | ICD-10-CM

## 2023-11-13 DIAGNOSIS — M25552 Pain in left hip: Secondary | ICD-10-CM | POA: Diagnosis not present

## 2023-11-13 NOTE — Therapy (Signed)
 OUTPATIENT PHYSICAL THERAPY LOWER EXTREMITY TREATMENT   Patient Name: Alejandro Foster MRN: [id number] DOB:[birth year], 89 y.o., male Today's Date: 11/13/2023  END OF SESSION:  PT End of Session - 11/13/23 1451     Visit Number 11    Number of Visits 16    Date for PT Re-Evaluation 12/24/23    Authorization Type [employer] approved 6 more visits 10/25/23 through 12/06/23    Authorization Time Period 10/24/24-12/06/23    Authorization - Visit Number 1    Authorization - Number of Visits 6    Progress Note Due on Visit 20    PT Start Time 1445    PT Stop Time 1529    PT Time Calculation (min) 44 min    Activity Tolerance Patient tolerated treatment well    Behavior During Therapy [facility] for tasks assessed/performed                  Past Medical History:  Diagnosis Date   Anemia    Arthritis    Hands   Bladder cancer (HCC)    Chronic bilateral thoracic back pain 12/12/2017   Complication of anesthesia    Hard time waking up in the past   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) 11/10/2019   Coronary artery disease    cardiologist-  dr blanca--  s/p  cardiac cath's w/ angioplasty and stenting x2   DDD (degenerative disc disease), lumbar    Depression    Diabetic peripheral neuropathy associated with type 2 diabetes mellitus (HCC) 12/12/2017   Dyslipidemia 01/25/2018   Dyspnea on exertion 08/22/2019   Foraminal stenosis of lumbar region 12/12/2017   GERD (gastroesophageal reflux disease)    History of kidney stones    Hyperlipidemia    Hypertension    Lumbar radiculopathy, right    right leg weakness   Myofascial pain syndrome 12/12/2017   Nephrolithiasis    bilateral non-obstructive per ct 01-19-2016   Peripheral neuropathy    feet   Posterior vitreous detachment of both eyes 11/10/2019   Pseudophakia of both eyes 11/10/2019   Right leg weakness    due to lumbar radiculopathy   Right ureteral stone    S/P coronary  artery stent placement    2002 x1  and 2003 x1   Spondylosis without myelopathy or radiculopathy, lumbar region 12/12/2017   Trigger finger 05/28/2018   Type 2 diabetes mellitus (HCC)    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) 11/10/2019   Wears hearing aid    BILATERAL   Past Surgical History:  Procedure Laterality Date   BIOPSY  07/11/2022   Procedure: BIOPSY;  Surgeon: Saintclair Jasper, MD;  Location: [facility] ENDOSCOPY;  Service: Gastroenterology;;   BOTOX  INJECTION N/A 03/20/2023   Procedure: BOTOX  INJECTION;  Surgeon: Saintclair Jasper, MD;  Location: [facility] ENDOSCOPY;  Service: Gastroenterology;  Laterality: N/A;   CARDIAC CATHETERIZATION  01/ 2002   dr blanca   mLAD 30%,  CFX OM3 40%,  RCA 30% previous stent site   CARDIOVASCULAR STRESS TEST  04-15-2009  dr blanca   normal nuclear study w/ no ischemia/  normal LV function and wall motion , ef 77%   CATARACT EXTRACTION W/ INTRAOCULAR LENS  IMPLANT, BILATERAL  2013  approx.   COLONOSCOPY WITH PROPOFOL   2014   CORONARY ANGIOPLASTY  1990  dr blanca   PTCA to OM2   CORONARY ANGIOPLASTY  10/ 1996  dr  tilley   PTCA to RCA   CORONARY ANGIOPLASTY WITH STENT PLACEMENT  04/ 1996  dr blanca   stenting to RCA   CYSTOSCOPY WITH URETEROSCOPY AND STENT PLACEMENT Right 03/28/2016   Procedure: CYSTOSCOPY WITH RIGHT  URETEROSCOPY AND STENT PLACEMENT;  Surgeon: Donnice Brooks, MD;  Location: [facility];  Service: Urology;  Laterality: Right;   ESOPHAGOGASTRODUODENOSCOPY (EGD) WITH PROPOFOL  N/A 07/11/2022   Procedure: ESOPHAGOGASTRODUODENOSCOPY (EGD) WITH PROPOFOL ;  Surgeon: Saintclair Jasper, MD;  Location: [facility] ENDOSCOPY;  Service: Gastroenterology;  Laterality: N/A;   ESOPHAGOGASTRODUODENOSCOPY (EGD) WITH PROPOFOL  N/A 03/20/2023   Procedure: ESOPHAGOGASTRODUODENOSCOPY (EGD) WITH PROPOFOL ;  Surgeon: Saintclair Jasper, MD;  Location: [facility] ENDOSCOPY;  Service: Gastroenterology;  Laterality: N/A;  with botox   injection s   EXTRACORPOREAL SHOCK WAVE LITHOTRIPSY  yrs ago   HOLMIUM LASER APPLICATION Right 03/28/2016   Procedure: HOLMIUM LASER APPLICATION;  Surgeon: Donnice Brooks, MD;  [facility];  Service: Urology;  Laterality: Right;   INTRAMEDULLARY (IM) NAIL INTERTROCHANTERIC Left 07/07/2022   Procedure: INTRAMEDULLARY (IM) NAILING OF LEFT FEMUR;  Surgeon: Jerri Kay HERO, MD;  Location: [facility] OR;  Service: Orthopedics;  Laterality: Left;   LAPAROSCOPIC CHOLECYSTECTOMY  1990's   SUBMUCOSAL INJECTION  07/11/2022   Procedure: SUBMUCOSAL INJECTION;  Surgeon: Saintclair Jasper, MD;  Location: [facility] ENDOSCOPY;  Service: Gastroenterology;;   TONSILLECTOMY AND ADENOIDECTOMY  child   URETEROLITHOTOMY  1990's   Patient Active Problem List   Diagnosis Date Noted   Fall 07/06/2022   Displaced intertrochanteric fracture of left femur, initial encounter for closed fracture (HCC) 07/06/2022   CAD (coronary artery disease) 07/06/2022   Coronary artery disease 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) 05/12/2022   Paving stone retinal degeneration, bilateral 11/03/2021   Senile purpura (HCC) 09/07/2020   Claudication (HCC) 03/17/2020   Wears hearing aid    Type 2 diabetes mellitus (HCC)    S/P coronary artery stent placement    Right ureteral stone    Right leg weakness    Lumbar radiculopathy, right    History of kidney stones    DDD (degenerative disc disease), lumbar    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) 11/10/2019   Pseudophakia of both eyes 11/10/2019   Posterior vitreous detachment of both eyes 11/10/2019   Amnesia 09/19/2019   Dyspnea on exertion 08/22/2019   Atypical chest pain 08/22/2019   Diabetic retinopathy associated with type 2 diabetes mellitus (HCC) 03/27/2019   Trigger finger 05/28/2018   Dyslipidemia 01/25/2018   Diabetic peripheral neuropathy associated with type 2 diabetes  mellitus (HCC) 12/12/2017   Chronic bilateral thoracic back pain 12/12/2017   Myofascial pain syndrome 12/12/2017   Foraminal stenosis of lumbar region 12/12/2017   Spondylosis without myelopathy or radiculopathy, lumbar region 12/12/2017   Cough 09/17/2017   Irritability and anger 09/17/2017   Localized edema 08/01/2017   Abnormal gait 01/09/2017   Fatigue 09/12/2016   Hypertensive heart disease without congestive heart failure 09/03/2014   Long term (current) use of insulin  (HCC) 09/03/2014   Ventricular premature depolarization 09/03/2014   Encounter for general adult medical examination without abnormal findings 08/26/2014   Gout 08/28/2013   Impacted cerumen 08/28/2013   Hypo-osmolality and hyponatremia 08/28/2013   Urge incontinence of urine 08/26/2012   Age-related osteoporosis without current pathological fracture 02/19/2012   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   Vitamin D deficiency 02/19/2012   Underimmunization status 12/26/2011   Backache 08/21/2011  Hearing loss 05/23/2011   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) 03/22/2010   Kidney stone 05/25/2009   ED (erectile dysfunction) of organic origin 05/25/2009   Obesity 05/25/2009   Metabolic syndrome 05/25/2009   Polyneuropathy 12/25/2008   Hyperglycemia due to type 2 diabetes mellitus (HCC) 12/25/2008   Gastro-esophageal reflux disease without esophagitis 12/25/2008   Insomnia 12/25/2008   Dilatation of aorta (HCC) 10/02/2008   Essential hypertension 10/02/2008   Hyperlipidemia 10/02/2008    PCP: Onita Rush, MD   REFERRING PROVIDER: Jerri Kay HERO, MD  REFERRING DIAG: [phone] (ICD-10-CM) - Displaced intertrochanteric fracture of left femur, initial encounter for closed fracture [facility])  THERAPY DIAG:  Pain in left hip  Muscle weakness (generalized)  Difficulty in walking, not elsewhere classified  Cramp and spasm  Abnormal posture  Rationale for Evaluation and  Treatment: Rehabilitation  ONSET DATE: Jul 19 2022  SUBJECTIVE:   SUBJECTIVE STATEMENT: Patient denies pain today. He just has some soreness in the left IT band.   From Eval: Patient presents with left hip pain that began when he broke his femur Jul 19 2022.  He was in the hospital and did PT at [facility]. He has been traveling and he has gain some weight and he feels he is limping more. Patient reports he had imaging done today and every has healed fine there is just some inflammation. It has been challenging to putting on his shoes, bending, and he feels his balance is off. Previously he was able to walk a mile and now he is limited to 0.5 miles.   PERTINENT HISTORY: Left femur fx May 2024; Type 2 DB; lumbar DDD; peripheral neuropathy; HTN; PAIN:  11/13/23 Are you having pain? Yes: NPRS scale: 0/ 10 Pain location: lateral femur; where his scares or located Pain description: constant soreness; achy Aggravating factors: bending, walking Relieving factors: Tylenol    PRECAUTIONS: None  RED FLAGS: None   WEIGHT BEARING RESTRICTIONS: No  FALLS:  Has patient fallen in last 6 months? Yes. Number of falls 1 he fell in the hospital walking with his walker  LIVING ENVIRONMENT: Lives with: lives with their spouse Lives in: House/apartment Stairs: Yes: Internal: 12 steps; on left going up Has following equipment at home: Walking cane  OCCUPATION: Retired Nutritional therapist  PLOF: Independent, Independent with basic ADLs, Independent with household mobility without device, Independent with community mobility without device, Independent with gait, and Independent with transfers  PATIENT GOALS: To be able to go camping again and be about to walk without a limp  NEXT MD VISIT: PRN  OBJECTIVE:  Note: Objective measures were completed at Evaluation unless otherwise noted.  DIAGNOSTIC FINDINGS: No final results yet  PATIENT SURVEYS:  LEFS: 38/80 47.5%  10/22/2023 LEFS: 56/80  70%  COGNITION: Overall cognitive status: Within functional limits for tasks assessed     SENSATION: WFL   MUSCLE LENGTH: Hamstrings: limited bilateral    POSTURE: rounded shoulders and forward head  PALPATION: Tenderness to palpation Lt TFL, glutes, and quads  LOWER EXTREMITY ROM: WFL; limited Lt hip IR/ER   LOWER EXTREMITY MMT:  MMT Right eval Left eval  Hip flexion 4+ 4-*  Hip extension    Hip abduction 4+ 4-  Hip adduction    Hip internal rotation    Hip external rotation    Knee flexion 4+ 4+  Knee extension 4+ 4shaky  Ankle dorsiflexion    Ankle plantarflexion    Ankle inversion    Ankle  eversion     (Blank rows = not tested)    FUNCTIONAL TESTS:  5 times sit to stand: 10.67 sec no UE support Timed up and go (TUG): 12.27 6 minute walk test: 892FT ; groin pain after 5 mins;   10/18/2023 1019ft . Had some left hip soreness after 4: 30  10/22/2023 TUG: 8.63ft GAIT: Distance walked: 892 FT Assistive device utilized: None Level of assistance: Complete Independence Comments: decreased step length on Rt; decreased stance time on Lt ; antaglic gait                                                                                                                              TREATMENT DATE:  11/13/2023 NuStep Level 5 6 mins- PT present to discuss status Leg Press 95# bilateral 2 x 10; 85# (Rt ) 70# (Lt)  x 20 bilateral (seat 7) Side stepping in // with red TB around ankles x4 laps Sit to stand + chest press holding 10 # 2 x10 10# DB waist height + march x 20 marches each hand Resisted walking with belt 10# (forwards backwards & sideways) x 7 laps each Seated clamshell with red loop 2 x 10 Supine clamshell with red loop 2 x 10  Side lying clamshell with yellow loop 2 x 10 8 inch step ups holding 10# DB unilateral 2 x 10 bilateral   10/24/2023 NuStep Level 5 6 mins- PT present to discuss status Side stepping in // with red TB around ankles x4 laps 8  inch step ups holding 10# DB unilateral x 10 bilateral  8 inch lateral step ups holding 10# DB unilateral x 10 bilateral  Sit to stand + chest press holding 10 # 2 x10 Leg Press 95# bilateral 2 x 10; 85# (Rt ) 70# (Lt)  x 20 bilateral (seat 7) 10# DB waist height + march x 20 marches each hand Resisted walking with belt 10# (forwards backwards & sideways) x 6 laps each Lunges to BOSU x 12 bilateral   10/22/2023 NuStep Level 5 5 mins- PT present to discuss status Goal Assessment LEFS: 56/80 70% TUG 8.88 sec Resisted walking with belt 10# (backwards & sideways) x 5 laps each Sit to stand holding 7 # 2 x10 Leg Press 95# bilateral 2 x 10; 60# unilateral x 20 bilateral  6in Step ups at stair holding unilateral 8# DB x 12 bilateral (forwards) 6in lateral step ups unilateral 8# DB hold x 12 bilateral  Marching on airex no UE support x 20 Heel raised on airex x 20 Discussion of flexibility exercises he can do to help him touch his toes to help tie his shoes.  PATIENT EDUCATION:  Education details: Ice; POC; examination findings; HEP Person educated: Patient Education method: Explanation, Demonstration, and Handouts Education comprehension: verbalized understanding, returned demonstration, and needs further education  HOME EXERCISE PROGRAM: Access Code: [email or web address] URL: [email or web address] Date: 10/11/2023 Prepared by: Kristeen Sar  Exercises - Supine Gluteus  Stretch  - 1 x daily - 7 x weekly - 2 sets - 20-30 hold - Supine Hamstring Stretch with Strap  - 1 x daily - 7 x weekly - 2 sets - 20-30 hold - Supine ITB Stretch with Strap  - 1 x daily - 7 x weekly - 2 sets - 20-30 hold - Seated Long Arc Quad  - 1 x daily - 7 x weekly - 2 sets - 10 reps - 2 hold - Standing Hamstring Stretch on Chair  - 1 x daily - 7 x weekly - 1 sets - 3 reps - 30 sec hold - Quadricep Stretch with Chair and Counter Support  - 1 x daily - 7 x weekly - 1 sets - 3 reps - 30 sec hold - Seated Figure 4  Piriformis Stretch  - 1 x daily - 7 x weekly - 1 sets - 3 reps - 30 sed hold - Standing 3-Way Leg Reach with Resistance at Ankles and Unilateral Counter Support  - 1 x daily - 7 x weekly - 2 sets - 10 reps ASSESSMENT:  CLINICAL IMPRESSION: Patient states his procedure was cancelled.  He is doing well but still some pain in the left IT band.  He was able to complete all tasks today with intermittent rest breaks.  Right LE step ups were more difficult, likely due to weakness in left glut medius.  He is very well motivated and compliant.  He should continue to do well.      75% BETTER  OBJECTIVE IMPAIRMENTS: Abnormal gait, decreased balance, difficulty walking, decreased ROM, decreased strength, increased muscle spasms, impaired flexibility, postural dysfunction, and pain.   ACTIVITY LIMITATIONS: carrying, bending, dressing, and locomotion level  PARTICIPATION LIMITATIONS: cleaning, laundry, community activity, school, and camping/ hiking  PERSONAL FACTORS: Age, Fitness, Time since onset of injury/illness/exacerbation, and 3+ comorbidities: Left femur fx May 2024; Type 2 DB;  peripheral neuropathy; HTN; are also affecting patient's functional outcome.   REHAB POTENTIAL: Good  CLINICAL DECISION MAKING: Stable/uncomplicated  EVALUATION COMPLEXITY: Moderate   GOALS: Goals reviewed with patient? Yes  SHORT TERM GOALS: Target date: 09/26/2023  Patient will be independent with initial HEP. Baseline:  Goal status: MET 10/18/2023  2.  Patient will be able to walk 0.5 miles without left him pain or discomfort. Baseline: increased pain and has to stop Goal status: MET 10/02/23  3.  Patient will ambulate > or = to 1,000 feet during for improved community ambulation. Baseline: 839ft Goal status: MET 10/18/2023    LONG TERM GOALS: Target date: 12/24/2023  Patient will demonstrate independence in advanced HEP. Baseline:  Goal status: IN PROGRESS 10/22/2023  2.  Patient will be able to  walk a mile with no left hip discomfort for improved cardiovascular endurance. Baseline: unable to walk 1 mile Goal status: MET 10/22/2023  3.  Patient will verbalize and demonstrate self-care strategies to manage pain including tissue mobility practices and change of position. Baseline:  Goal status: IN PROGRESS 10/22/2023  4.  Patient will be able to bend and put on his socks and shoes with no left hip discomfort. Baseline: 2/10 pain Goal status: IN PROGRESS (still challenging) 10/22/2023  5.  Patient will perform TUG in < or = to 9 sec to decrease risk of falls. Baseline: 12.27sec Goal status: MET 10/22/2023  6.  Patient will score 48/80 on LEFS due to improved function of Lt LE. Baseline: 38/80  Goal status: MET 10/22/2023    7.  Patient will  ambulate > or = to 1144ft during to be within 60% of age expected norms for test and improve community negotiation.  Baseline: 1058ft  Goal status:NEW   PLAN:  PT FREQUENCY: 1-2x/week  PT DURATION: 6 weeks starting 11/12/2023  PLANNED INTERVENTIONS: 97164- PT Re-evaluation, 97110-Therapeutic exercises, 97530- Therapeutic activity, 97112- Neuromuscular re-education, 97535- Self Care, 02859- Manual therapy, [phone]- Gait training, [phone]- Canalith repositioning, [phone]- Aquatic Therapy, [phone]- Electrical stimulation (unattended), [phone]- Electrical stimulation (manual), [phone]- Ionotophoresis 4mg /ml Dexamethasone , 79439 (1-2 muscles), 20561 (3+ muscles)- Dry Needling, Patient/Family education, Balance training, Stair training, Taping, Joint mobilization, Joint manipulation, Spinal manipulation, Spinal mobilization, Vestibular training, Cryotherapy, and Moist heat  PLAN FOR NEXT SESSION: Continue single leg strength & balance; continue hip strengthening & balance training    Windsor Goeken B. Cristoval Teall, PT 11/13/23 8:21 PM [facility] [address], [address] [place], [place] [address] Phone # [phone] Fax [phone]

## 2023-11-20 ENCOUNTER — Ambulatory Visit: Attending: Orthopaedic Surgery

## 2023-11-20 DIAGNOSIS — R262 Difficulty in walking, not elsewhere classified: Secondary | ICD-10-CM | POA: Insufficient documentation

## 2023-11-20 DIAGNOSIS — M25552 Pain in left hip: Secondary | ICD-10-CM | POA: Diagnosis present

## 2023-11-20 DIAGNOSIS — R293 Abnormal posture: Secondary | ICD-10-CM | POA: Insufficient documentation

## 2023-11-20 DIAGNOSIS — R252 Cramp and spasm: Secondary | ICD-10-CM | POA: Diagnosis present

## 2023-11-20 DIAGNOSIS — M6281 Muscle weakness (generalized): Secondary | ICD-10-CM | POA: Diagnosis present

## 2023-11-20 NOTE — Therapy (Signed)
 OUTPATIENT PHYSICAL THERAPY LOWER EXTREMITY TREATMENT   Patient Name: Alejandro Foster MRN: [id number] DOB:1933-07-11, 89 y.o., male Today's Date: 11/20/2023  END OF SESSION:  PT End of Session - 11/20/23 1022     Visit Number 12    Number of Visits 16    Date for PT Re-Evaluation 12/24/23    Authorization Type [employer] approved 6 more visits 10/25/23 through 12/06/23    Authorization Time Period 10/24/24-12/06/23    Authorization - Visit Number 12    Authorization - Number of Visits 16    Progress Note Due on Visit 20    PT Start Time 1018    PT Stop Time 1100    PT Time Calculation (min) 42 min    Activity Tolerance Patient tolerated treatment well    Behavior During Therapy [facility] for tasks assessed/performed                  Past Medical History:  Diagnosis Date   Anemia    Arthritis    Hands   Bladder cancer (HCC)    Chronic bilateral thoracic back pain 12/12/2017   Complication of anesthesia    Hard time waking up in the past   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) 11/10/2019   Coronary artery disease    cardiologist-  dr blanca--  s/p  cardiac cath's w/ angioplasty and stenting x2   DDD (degenerative disc disease), lumbar    Depression    Diabetic peripheral neuropathy associated with type 2 diabetes mellitus (HCC) 12/12/2017   Dyslipidemia 01/25/2018   Dyspnea on exertion 08/22/2019   Foraminal stenosis of lumbar region 12/12/2017   GERD (gastroesophageal reflux disease)    History of kidney stones    Hyperlipidemia    Hypertension    Lumbar radiculopathy, right    right leg weakness   Myofascial pain syndrome 12/12/2017   Nephrolithiasis    bilateral non-obstructive per ct 01-19-2016   Peripheral neuropathy    feet   Posterior vitreous detachment of both eyes 11/10/2019   Pseudophakia of both eyes 11/10/2019   Right leg weakness    due to lumbar radiculopathy   Right ureteral stone    S/P coronary  artery stent placement    2002 x1  and 2003 x1   Spondylosis without myelopathy or radiculopathy, lumbar region 12/12/2017   Trigger finger 05/28/2018   Type 2 diabetes mellitus (HCC)    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) 11/10/2019   Wears hearing aid    BILATERAL   Past Surgical History:  Procedure Laterality Date   BIOPSY  07/11/2022   Procedure: BIOPSY;  Surgeon: Saintclair Jasper, MD;  Location: [facility] ENDOSCOPY;  Service: Gastroenterology;;   BOTOX  INJECTION N/A 03/20/2023   Procedure: BOTOX  INJECTION;  Surgeon: Saintclair Jasper, MD;  Location: [facility] ENDOSCOPY;  Service: Gastroenterology;  Laterality: N/A;   CARDIAC CATHETERIZATION  01/ 2002   dr blanca   mLAD 30%,  CFX OM3 40%,  RCA 30% previous stent site   CARDIOVASCULAR STRESS TEST  04-15-2009  dr blanca   normal nuclear study w/ no ischemia/  normal LV function and wall motion , ef 77%   CATARACT EXTRACTION W/ INTRAOCULAR LENS  IMPLANT, BILATERAL  2013  approx.   COLONOSCOPY WITH PROPOFOL   2014   CORONARY ANGIOPLASTY  1990  dr blanca   PTCA to OM2   CORONARY ANGIOPLASTY  10/ 1996  dr  tilley   PTCA to RCA   CORONARY ANGIOPLASTY WITH STENT PLACEMENT  04/ 1996  dr blanca   stenting to RCA   CYSTOSCOPY WITH URETEROSCOPY AND STENT PLACEMENT Right 03/28/2016   Procedure: CYSTOSCOPY WITH RIGHT  URETEROSCOPY AND STENT PLACEMENT;  Surgeon: Donnice Brooks, MD;  Location[facility];  Service: Urology;  Laterality: Right;   ESOPHAGOGASTRODUODENOSCOPY (EGD) WITH PROPOFOL  N/A 07/11/2022   Procedure: ESOPHAGOGASTRODUODENOSCOPY (EGD) WITH PROPOFOL ;  Surgeon: Saintclair Jasper, MD;  Location: [facility] ENDOSCOPY;  Service: Gastroenterology;  Laterality: N/A;   ESOPHAGOGASTRODUODENOSCOPY (EGD) WITH PROPOFOL  N/A 03/20/2023   Procedure: ESOPHAGOGASTRODUODENOSCOPY (EGD) WITH PROPOFOL ;  Surgeon: Saintclair Jasper, MD;  Location: [facility] ENDOSCOPY;  Service: Gastroenterology;  Laterality: N/A;  with botox   injection s   EXTRACORPOREAL SHOCK WAVE LITHOTRIPSY  yrs ago   HOLMIUM LASER APPLICATION Right 03/28/2016   Procedure: HOLMIUM LASER APPLICATION;  Surgeon: Donnice Brooks, MD;  Location: [facility];  Service: Urology;  Laterality: Right;   INTRAMEDULLARY (IM) NAIL INTERTROCHANTERIC Left 07/07/2022   Procedure: INTRAMEDULLARY (IM) NAILING OF LEFT FEMUR;  Surgeon: Jerri Kay HERO, MD;  Location: [facility] OR;  Service: Orthopedics;  Laterality: Left;   LAPAROSCOPIC CHOLECYSTECTOMY  1990's   SUBMUCOSAL INJECTION  07/11/2022   Procedure: SUBMUCOSAL INJECTION;  Surgeon: Saintclair Jasper, MD;  Location[facility] ENDOSCOPY;  Service: Gastroenterology;;   TONSILLECTOMY AND ADENOIDECTOMY  child   URETEROLITHOTOMY  1990's   Patient Active Problem List   Diagnosis Date Noted   Fall 07/06/2022   Displaced intertrochanteric fracture of left femur, initial encounter for closed fracture (HCC) 07/06/2022   CAD (coronary artery disease) 07/06/2022   Coronary artery disease 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) 05/12/2022   Paving stone retinal degeneration, bilateral 11/03/2021   Senile purpura (HCC) 09/07/2020   Claudication (HCC) 03/17/2020   Wears hearing aid    Type 2 diabetes mellitus (HCC)    S/P coronary artery stent placement    Right ureteral stone    Right leg weakness    Lumbar radiculopathy, right    History of kidney stones    DDD (degenerative disc disease), lumbar    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) 11/10/2019   Pseudophakia of both eyes 11/10/2019   Posterior vitreous detachment of both eyes 11/10/2019   Amnesia 09/19/2019   Dyspnea on exertion 08/22/2019   Atypical chest pain 08/22/2019   Diabetic retinopathy associated with type 2 diabetes mellitus (HCC) 03/27/2019   Trigger finger 05/28/2018   Dyslipidemia 01/25/2018   Diabetic peripheral neuropathy associated with type 2 diabetes  mellitus (HCC) 12/12/2017   Chronic bilateral thoracic back pain 12/12/2017   Myofascial pain syndrome 12/12/2017   Foraminal stenosis of lumbar region 12/12/2017   Spondylosis without myelopathy or radiculopathy, lumbar region 12/12/2017   Cough 09/17/2017   Irritability and anger 09/17/2017   Localized edema 08/01/2017   Abnormal gait 01/09/2017   Fatigue 09/12/2016   Hypertensive heart disease without congestive heart failure 09/03/2014   Long term (current) use of insulin  (HCC) 09/03/2014   Ventricular premature depolarization 09/03/2014   Encounter for general adult medical examination without abnormal findings 08/26/2014   Gout 08/28/2013   Impacted cerumen 08/28/2013   Hypo-osmolality and hyponatremia 08/28/2013   Urge incontinence of urine 08/26/2012   Age-related osteoporosis without current pathological fracture 02/19/2012   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   Vitamin D deficiency 02/19/2012   Underimmunization status 12/26/2011   Backache 08/21/2011  Hearing loss 05/23/2011   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) 03/22/2010   Kidney stone 05/25/2009   ED (erectile dysfunction) of organic origin 05/25/2009   Obesity 05/25/2009   Metabolic syndrome 05/25/2009   Polyneuropathy 12/25/2008   Hyperglycemia due to type 2 diabetes mellitus (HCC) 12/25/2008   Gastro-esophageal reflux disease without esophagitis 12/25/2008   Insomnia 12/25/2008   Dilatation of aorta (HCC) 10/02/2008   Essential hypertension 10/02/2008   Hyperlipidemia 10/02/2008    PCP: Onita Rush, MD   REFERRING PROVIDER: Jerri Kay HERO, MD  REFERRING DIAG: [phone] (ICD-10-CM) - Displaced intertrochanteric fracture of left femur, initial encounter for closed fracture [facility])  THERAPY DIAG:  Pain in left hip  Muscle weakness (generalized)  Difficulty in walking, not elsewhere classified  Cramp and spasm  Abnormal posture  Rationale for Evaluation and  Treatment: Rehabilitation  ONSET DATE: Jul 19 2022  SUBJECTIVE:   SUBJECTIVE STATEMENT: Patient continues to deny pain in right hip . He just has some soreness in the left IT band.   From Eval: Patient presents with left hip pain that began when he broke his femur Jul 19 2022.  He was in the hospital and did PT at [facility]. He has been traveling and he has gain some weight and he feels he is limping more. Patient reports he had imaging done today and every has healed fine there is just some inflammation. It has been challenging to putting on his shoes, bending, and he feels his balance is off. Previously he was able to walk a mile and now he is limited to 0.5 miles.   PERTINENT HISTORY: Left femur fx May 2024; Type 2 DB; lumbar DDD; peripheral neuropathy; HTN; PAIN:  11/20/23 Are you having pain? Yes: NPRS scale: 0/ 10 Pain location: lateral femur; where his scares or located Pain description: constant soreness; achy Aggravating factors: bending, walking Relieving factors: Tylenol    PRECAUTIONS: None  RED FLAGS: None   WEIGHT BEARING RESTRICTIONS: No  FALLS:  Has patient fallen in last 6 months? Yes. Number of falls 1 he fell in the hospital walking with his walker  LIVING ENVIRONMENT: Lives with: lives with their spouse Lives in: House/apartment Stairs: Yes: Internal: 12 steps; on left going up Has following equipment at home: Walking cane  OCCUPATION: Retired Nutritional therapist  PLOF: Independent, Independent with basic ADLs, Independent with household mobility without device, Independent with community mobility without device, Independent with gait, and Independent with transfers  PATIENT GOALS: To be able to go camping again and be about to walk without a limp  NEXT MD VISIT: PRN  OBJECTIVE:  Note: Objective measures were completed at Evaluation unless otherwise noted.  DIAGNOSTIC FINDINGS: No final results yet  PATIENT SURVEYS:  LEFS: 38/80 47.5%  10/22/2023  LEFS: 56/80 70%  COGNITION: Overall cognitive status: Within functional limits for tasks assessed     SENSATION: WFL   MUSCLE LENGTH: Hamstrings: limited bilateral    POSTURE: rounded shoulders and forward head  PALPATION: Tenderness to palpation Lt TFL, glutes, and quads  LOWER EXTREMITY ROM: WFL; limited Lt hip IR/ER   LOWER EXTREMITY MMT:  MMT Right eval Left eval  Hip flexion 4+ 4-*  Hip extension    Hip abduction 4+ 4-  Hip adduction    Hip internal rotation    Hip external rotation    Knee flexion 4+ 4+  Knee extension 4+ 4shaky  Ankle dorsiflexion    Ankle plantarflexion    Ankle  inversion    Ankle eversion     (Blank rows = not tested)    FUNCTIONAL TESTS:  5 times sit to stand: 10.67 sec no UE support Timed up and go (TUG): 12.27 6 minute walk test: 892FT ; groin pain after 5 mins;   10/18/2023 1068ft . Had some left hip soreness after 4: 30  10/22/2023 TUG: 8.54ft GAIT: Distance walked: 892 FT Assistive device utilized: None Level of assistance: Complete Independence Comments: decreased step length on Rt; decreased stance time on Lt ; antaglic gait                                                                                                                              TREATMENT DATE:  11/20/2023 NuStep Level 5 6 mins- PT present to discuss status/progress and proper alignment while doing Nustep Lateral band walks in // bars with yellow loop x 5 laps Fwd backward walking with yellow loop x 5 laps Standing hip extension and abduction with 7 lb ankle weights 2 x 10  Dynamic SLS with cone touch (foot on floor, cone on counter top 3 x 10 each LE Dynamic SLS with 1D ball toss at rebounder 2 x 20 each LE Supine clamshell with yellow loop 2 x 10  Side lying clamshell with yellow loop 2 x 10 Supine SLR 2 x 10 each LE Supine hip abduction 2 x 10 with towel under heel Supine IT band stretch 5 x 10 sec each left Supine adductor  stretch 5 x 10  sec each left  Supine butterfly stretch x 10 holding 10 sec each  11/13/2023 NuStep Level 5 6 mins- PT present to discuss status Leg Press 95# bilateral 2 x 10; 85# (Rt ) 70# (Lt)  x 20 bilateral (seat 7) Side stepping in // with red TB around ankles x4 laps Sit to stand + chest press holding 10 # 2 x10 10# DB waist height + march x 20 marches each hand Resisted walking with belt 10# (forwards backwards & sideways) x 7 laps each Seated clamshell with red loop 2 x 10 Supine clamshell with red loop 2 x 10  Side lying clamshell with yellow loop 2 x 10 8 inch step ups holding 10# DB unilateral 2 x 10 bilateral   10/24/2023 NuStep Level 5 6 mins- PT present to discuss status Side stepping in // with red TB around ankles x4 laps 8 inch step ups holding 10# DB unilateral x 10 bilateral  8 inch lateral step ups holding 10# DB unilateral x 10 bilateral  Sit to stand + chest press holding 10 # 2 x10 Leg Press 95# bilateral 2 x 10; 85# (Rt ) 70# (Lt)  x 20 bilateral (seat 7) 10# DB waist height + march x 20 marches each hand Resisted walking with belt 10# (forwards backwards & sideways) x 6 laps each Lunges to BOSU x 12 bilateral   PATIENT EDUCATION:  Education details: Ice;  POC; examination findings; HEP Person educated: Patient Education method: Explanation, Demonstration, and Handouts Education comprehension: verbalized understanding, returned demonstration, and needs further education  HOME EXERCISE PROGRAM: Access Code: [email or web address] URL: [email or web address] Date: 10/11/2023 Prepared by: Kristeen Sar  Exercises - Supine Gluteus Stretch  - 1 x daily - 7 x weekly - 2 sets - 20-30 hold - Supine Hamstring Stretch with Strap  - 1 x daily - 7 x weekly - 2 sets - 20-30 hold - Supine ITB Stretch with Strap  - 1 x daily - 7 x weekly - 2 sets - 20-30 hold - Seated Long Arc Quad  - 1 x daily - 7 x weekly - 2 sets - 10 reps - 2 hold - Standing Hamstring Stretch on Chair  - 1 x daily -  7 x weekly - 1 sets - 3 reps - 30 sec hold - Quadricep Stretch with Chair and Counter Support  - 1 x daily - 7 x weekly - 1 sets - 3 reps - 30 sec hold - Seated Figure 4 Piriformis Stretch  - 1 x daily - 7 x weekly - 1 sets - 3 reps - 30 sed hold - Standing 3-Way Leg Reach with Resistance at Ankles and Unilateral Counter Support  - 1 x daily - 7 x weekly - 2 sets - 10 reps ASSESSMENT:  CLINICAL IMPRESSION: Patient continues to have some lingering pain in the left IT band but doing well with regard to actual hip pain.  He states he only gets stiff if he sits for a long period of time and if riding in a car for a while.  He was able to do all tasks today with good tolerance and minimal fatigue.  He was able to perform single leg dynamic balance today with decreased loss of balance and good use of hip strategies.  We suggested adding tennis ball rolling to IT band at home to aid in [facility] and reduced pain.    He is very well motivated and compliant.  He should continue to do well.   He would benefit from continuing skilled PT for left hip flexibility and strength to progress toward goals below.     75% BETTER  OBJECTIVE IMPAIRMENTS: Abnormal gait, decreased balance, difficulty walking, decreased ROM, decreased strength, increased muscle spasms, impaired flexibility, postural dysfunction, and pain.   ACTIVITY LIMITATIONS: carrying, bending, dressing, and locomotion level  PARTICIPATION LIMITATIONS: cleaning, laundry, community activity, school, and camping/ hiking  PERSONAL FACTORS: Age, Fitness, Time since onset of injury/illness/exacerbation, and 3+ comorbidities: Left femur fx May 2024; Type 2 DB;  peripheral neuropathy; HTN; are also affecting patient's functional outcome.   REHAB POTENTIAL: Good  CLINICAL DECISION MAKING: Stable/uncomplicated  EVALUATION COMPLEXITY: Moderate   GOALS: Goals reviewed with patient? Yes  SHORT TERM GOALS: Target date: 09/26/2023  Patient will be independent  with initial HEP. Baseline:  Goal status: MET 10/18/2023  2.  Patient will be able to walk 0.5 miles without left him pain or discomfort. Baseline: increased pain and has to stop Goal status: MET 10/02/23  3.  Patient will ambulate > or = to 1,000 feet during for improved community ambulation. Baseline: 843ft Goal status: MET 10/18/2023    LONG TERM GOALS: Target date: 12/24/2023  Patient will demonstrate independence in advanced HEP. Baseline:  Goal status: IN PROGRESS 10/22/2023  2.  Patient will be able to walk a mile with no left hip discomfort for improved cardiovascular endurance. Baseline: unable to walk 1 mile Goal  status: MET 10/22/2023  3.  Patient will verbalize and demonstrate self-care strategies to manage pain including tissue mobility practices and change of position. Baseline:  Goal status: IN PROGRESS 10/22/2023  4.  Patient will be able to bend and put on his socks and shoes with no left hip discomfort. Baseline: 2/10 pain Goal status: IN PROGRESS (still challenging) 10/22/2023  5.  Patient will perform TUG in < or = to 9 sec to decrease risk of falls. Baseline: 12.27sec Goal status: MET 10/22/2023  6.  Patient will score 48/80 on LEFS due to improved function of Lt LE. Baseline: 38/80  Goal status: MET 10/22/2023    7.  Patient will ambulate > or = to 1165ft during to be within 60% of age expected norms for test and improve community negotiation.  Baseline: 1076ft  Goal status:NEW   PLAN:  PT FREQUENCY: 1-2x/week  PT DURATION: 6 weeks starting 11/12/2023  PLANNED INTERVENTIONS: 97164- PT Re-evaluation, 97110-Therapeutic exercises, 97530- Therapeutic activity, 97112- Neuromuscular re-education, 97535- Self Care, 02859- Manual therapy, [phone]- Gait training, [phone]- Canalith repositioning, [phone]- Aquatic Therapy, [phone]- Electrical stimulation (unattended), [phone]- Electrical stimulation (manual), [id number]- Ionotophoresis 4mg /ml Dexamethasone , 79439 (1-2  muscles), 20561 (3+ muscles)- Dry Needling, Patient/Family education, Balance training, Stair training, Taping, Joint mobilization, Joint manipulation, Spinal manipulation, Spinal mobilization, Vestibular training, Cryotherapy, and Moist heat  PLAN FOR NEXT SESSION: Continue single leg strength & balance; continue hip strengthening & balance training    Kaegan Stigler B. Ayme Short, PT 11/20/23 12:01 PM [facility] [address], [address] [place], [place] [address] Phone # [phone] Fax [phone]

## 2023-11-26 ENCOUNTER — Ambulatory Visit: Admitting: Physical Therapy

## 2023-11-26 ENCOUNTER — Encounter: Payer: Self-pay | Admitting: Physical Therapy

## 2023-11-26 DIAGNOSIS — M25552 Pain in left hip: Secondary | ICD-10-CM

## 2023-11-26 DIAGNOSIS — M6281 Muscle weakness (generalized): Secondary | ICD-10-CM

## 2023-11-26 DIAGNOSIS — R262 Difficulty in walking, not elsewhere classified: Secondary | ICD-10-CM

## 2023-11-26 NOTE — Therapy (Signed)
 OUTPATIENT PHYSICAL THERAPY LOWER EXTREMITY TREATMENT   Patient Name: Alejandro Foster MRN: [id number] DOB:[birth year], 89 y.o., male Today's Date: 11/26/2023  END OF SESSION:  PT End of Session - 11/26/23 1315     Visit Number 13    Number of Visits 16    Date for PT Re-Evaluation 12/24/23    Authorization Type [employer] approved 6 more visits 10/25/23 through 12/06/23    Authorization Time Period 10/24/24-12/06/23    Authorization - Visit Number 13    Authorization - Number of Visits 16    Progress Note Due on Visit 20    PT Start Time 1231    PT Stop Time 1313    PT Time Calculation (min) 42 min    Activity Tolerance Patient tolerated treatment well    Behavior During Therapy [facility] for tasks assessed/performed                   Past Medical History:  Diagnosis Date   Anemia    Arthritis    Hands   Bladder cancer (HCC)    Chronic bilateral thoracic back pain 12/12/2017   Complication of anesthesia    Hard time waking up in the past   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) 11/10/2019   Coronary artery disease    cardiologist-  dr blanca--  s/p  cardiac cath's w/ angioplasty and stenting x2   DDD (degenerative disc disease), lumbar    Depression    Diabetic peripheral neuropathy associated with type 2 diabetes mellitus (HCC) 12/12/2017   Dyslipidemia 01/25/2018   Dyspnea on exertion 08/22/2019   Foraminal stenosis of lumbar region 12/12/2017   GERD (gastroesophageal reflux disease)    History of kidney stones    Hyperlipidemia    Hypertension    Lumbar radiculopathy, right    right leg weakness   Myofascial pain syndrome 12/12/2017   Nephrolithiasis    bilateral non-obstructive per ct 01-19-2016   Peripheral neuropathy    feet   Posterior vitreous detachment of both eyes 11/10/2019   Pseudophakia of both eyes 11/10/2019   Right leg weakness    due to lumbar radiculopathy   Right ureteral stone    S/P coronary  artery stent placement    2002 x1  and 2003 x1   Spondylosis without myelopathy or radiculopathy, lumbar region 12/12/2017   Trigger finger 05/28/2018   Type 2 diabetes mellitus (HCC)    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) 11/10/2019   Wears hearing aid    BILATERAL   Past Surgical History:  Procedure Laterality Date   BIOPSY  07/11/2022   Procedure: BIOPSY;  Surgeon: Saintclair Jasper, MD;  Location: [facility] ENDOSCOPY;  Service: Gastroenterology;;   BOTOX  INJECTION N/A 03/20/2023   Procedure: BOTOX  INJECTION;  Surgeon: Saintclair Jasper, MD;  Location: [facility] ENDOSCOPY;  Service: Gastroenterology;  Laterality: N/A;   CARDIAC CATHETERIZATION  01/ 2002   dr blanca   mLAD 30%,  CFX OM3 40%,  RCA 30% previous stent site   CARDIOVASCULAR STRESS TEST  04-15-2009  dr blanca   normal nuclear study w/ no ischemia/  normal LV function and wall motion , ef 77%   CATARACT EXTRACTION W/ INTRAOCULAR LENS  IMPLANT, BILATERAL  2013  approx.   COLONOSCOPY WITH PROPOFOL   2014   CORONARY ANGIOPLASTY  1990  dr blanca   PTCA to OM2   CORONARY ANGIOPLASTY  10/ 1996  dr blanca   PTCA to RCA   CORONARY ANGIOPLASTY WITH STENT PLACEMENT  04/ 1996  dr blanca   stenting to RCA   CYSTOSCOPY WITH URETEROSCOPY AND STENT PLACEMENT Right 03/28/2016   Procedure: CYSTOSCOPY WITH RIGHT  URETEROSCOPY AND STENT PLACEMENT;  Surgeon: Donnice Brooks, MD;  Location: [facility];  Service: Urology;  Laterality: Right;   ESOPHAGOGASTRODUODENOSCOPY (EGD) WITH PROPOFOL  N/A 07/11/2022   Procedure: ESOPHAGOGASTRODUODENOSCOPY (EGD) WITH PROPOFOL ;  Surgeon: Saintclair Jasper, MD;  Location: [facility] ENDOSCOPY;  Service: Gastroenterology;  Laterality: N/A;   ESOPHAGOGASTRODUODENOSCOPY (EGD) WITH PROPOFOL  N/A 03/20/2023   Procedure: ESOPHAGOGASTRODUODENOSCOPY (EGD) WITH PROPOFOL ;  Surgeon: Saintclair Jasper, MD;  Location: [facility] ENDOSCOPY;  Service: Gastroenterology;  Laterality: N/A;  with botox   injection s   EXTRACORPOREAL SHOCK WAVE LITHOTRIPSY  yrs ago   HOLMIUM LASER APPLICATION Right 03/28/2016   Procedure: HOLMIUM LASER APPLICATION;  Surgeon: Donnice Brooks, MD;  Location: [facility];  Service: Urology;  Laterality: Right;   INTRAMEDULLARY (IM) NAIL INTERTROCHANTERIC Left 07/07/2022   Procedure: INTRAMEDULLARY (IM) NAILING OF LEFT FEMUR;  Surgeon: Jerri Kay HERO, MD;  Location: [facility] OR;  Service: Orthopedics;  Laterality: Left;   LAPAROSCOPIC CHOLECYSTECTOMY  1990's   SUBMUCOSAL INJECTION  07/11/2022   Procedure: SUBMUCOSAL INJECTION;  Surgeon: Saintclair Jasper, MD;  Location: [facility] ENDOSCOPY;  Service: Gastroenterology;;   TONSILLECTOMY AND ADENOIDECTOMY  child   URETEROLITHOTOMY  1990's   Patient Active Problem List   Diagnosis Date Noted   Fall 07/06/2022   Displaced intertrochanteric fracture of left femur, initial encounter for closed fracture (HCC) 07/06/2022   CAD (coronary artery disease) 07/06/2022   Coronary artery disease 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) 05/12/2022   Paving stone retinal degeneration, bilateral 11/03/2021   Senile purpura (HCC) 09/07/2020   Claudication (HCC) 03/17/2020   Wears hearing aid    Type 2 diabetes mellitus (HCC)    S/P coronary artery stent placement    Right ureteral stone    Right leg weakness    Lumbar radiculopathy, right    History of kidney stones    DDD (degenerative disc disease), lumbar    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) 11/10/2019   Pseudophakia of both eyes 11/10/2019   Posterior vitreous detachment of both eyes 11/10/2019   Amnesia 09/19/2019   Dyspnea on exertion 08/22/2019   Atypical chest pain 08/22/2019   Diabetic retinopathy associated with type 2 diabetes mellitus (HCC) 03/27/2019   Trigger finger 05/28/2018   Dyslipidemia 01/25/2018   Diabetic peripheral neuropathy associated with type 2 diabetes  mellitus (HCC) 12/12/2017   Chronic bilateral thoracic back pain 12/12/2017   Myofascial pain syndrome 12/12/2017   Foraminal stenosis of lumbar region 12/12/2017   Spondylosis without myelopathy or radiculopathy, lumbar region 12/12/2017   Cough 09/17/2017   Irritability and anger 09/17/2017   Localized edema 08/01/2017   Abnormal gait 01/09/2017   Fatigue 09/12/2016   Hypertensive heart disease without congestive heart failure 09/03/2014   Long term (current) use of insulin  (HCC) 09/03/2014   Ventricular premature depolarization 09/03/2014   Encounter for general adult medical examination without abnormal findings 08/26/2014   Gout 08/28/2013   Impacted cerumen 08/28/2013   Hypo-osmolality and hyponatremia 08/28/2013   Urge incontinence of urine 08/26/2012   Age-related osteoporosis without current pathological fracture 02/19/2012   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   Vitamin D deficiency 02/19/2012   Underimmunization status 12/26/2011   Backache 08/21/2011  Hearing loss 05/23/2011   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) 03/22/2010   Kidney stone 05/25/2009   ED (erectile dysfunction) of organic origin 05/25/2009   Obesity 05/25/2009   Metabolic syndrome 05/25/2009   Polyneuropathy 12/25/2008   Hyperglycemia due to type 2 diabetes mellitus (HCC) 12/25/2008   Gastro-esophageal reflux disease without esophagitis 12/25/2008   Insomnia 12/25/2008   Dilatation of aorta (HCC) 10/02/2008   Essential hypertension 10/02/2008   Hyperlipidemia 10/02/2008    PCP: Onita Rush, MD   REFERRING PROVIDER: Jerri Kay HERO, MD  REFERRING DIAG: [phone] (ICD-10-CM) - Displaced intertrochanteric fracture of left femur, initial encounter for closed fracture [facility])  THERAPY DIAG:  Pain in left hip  Muscle weakness (generalized)  Difficulty in walking, not elsewhere classified  Rationale for Evaluation and Treatment: Rehabilitation  ONSET DATE:  Jul 19 2022  SUBJECTIVE:   SUBJECTIVE STATEMENT: Patient reports he is doing good today. He is not currently having any pain.  From Eval: Patient presents with left hip pain that began when he broke his femur Jul 19 2022.  He was in the hospital and did PT at [facility]. He has been traveling and he has gain some weight and he feels he is limping more. Patient reports he had imaging done today and every has healed fine there is just some inflammation. It has been challenging to putting on his shoes, bending, and he feels his balance is off. Previously he was able to walk a mile and now he is limited to 0.5 miles.   PERTINENT HISTORY: Left femur fx May 2024; Type 2 DB; lumbar DDD; peripheral neuropathy; HTN; PAIN:  11/20/23 Are you having pain? Yes: NPRS scale: 0/ 10 Pain location: lateral femur; where his scares or located Pain description: constant soreness; achy Aggravating factors: bending, walking Relieving factors: Tylenol    PRECAUTIONS: None  RED FLAGS: None   WEIGHT BEARING RESTRICTIONS: No  FALLS:  Has patient fallen in last 6 months? Yes. Number of falls 1 he fell in the hospital walking with his walker  LIVING ENVIRONMENT: Lives with: lives with their spouse Lives in: House/apartment Stairs: Yes: Internal: 12 steps; on left going up Has following equipment at home: Walking cane  OCCUPATION: Retired Nutritional therapist  PLOF: Independent, Independent with basic ADLs, Independent with household mobility without device, Independent with community mobility without device, Independent with gait, and Independent with transfers  PATIENT GOALS: To be able to go camping again and be about to walk without a limp  NEXT MD VISIT: PRN  OBJECTIVE:  Note: Objective measures were completed at Evaluation unless otherwise noted.  DIAGNOSTIC FINDINGS: No final results yet  PATIENT SURVEYS:  LEFS: 38/80 47.5%  10/22/2023 LEFS: 56/80 70%  COGNITION: Overall cognitive status:  Within functional limits for tasks assessed     SENSATION: WFL   MUSCLE LENGTH: Hamstrings: limited bilateral    POSTURE: rounded shoulders and forward head  PALPATION: Tenderness to palpation Lt TFL, glutes, and quads  LOWER EXTREMITY ROM: WFL; limited Lt hip IR/ER   LOWER EXTREMITY MMT:  MMT Right eval Left eval  Hip flexion 4+ 4-*  Hip extension    Hip abduction 4+ 4-  Hip adduction    Hip internal rotation    Hip external rotation    Knee flexion 4+ 4+  Knee extension 4+ 4shaky  Ankle dorsiflexion    Ankle plantarflexion    Ankle inversion    Ankle eversion     (Blank rows =  not tested)    FUNCTIONAL TESTS:  5 times sit to stand: 10.67 sec no UE support Timed up and go (TUG): 12.27 6 minute walk test: 892FT ; groin pain after 5 mins;   10/18/2023 1038ft . Had some left hip soreness after 4: 30  10/22/2023 TUG: 8.57ft GAIT: Distance walked: 892 FT Assistive device utilized: None Level of assistance: Complete Independence Comments: decreased step length on Rt; decreased stance time on Lt ; antaglic gait                                                                                                                              TREATMENT DATE:  11/26/2023 NuStep Level 5 6 mins- PT present to discuss status/progress and proper alignment while doing Nustep Lateral band walks in // bars with yellow loop x 4 laps Standing clam with yellow loop around knees 3 x 10 bilateral  Dynamic SLS with cone touch (foot on floor, cone on counter top 3 x 10 each LE Leg Press 100# bilateral 2 x 10; (able to do 100# on Right) 80# (Lt) x 20 each (seat 7) Resisted walking with belt 10# (forwards backwards & sideways) x 6 laps each Supine clamshell with red loop 2 x 10  Side lying clamshell with red loop 2 x 10 Supine IT band stretch 5 x 10 sec each left Supine adductor  stretch 5 x 10 sec each left  Supine butterfly stretch x 5 holding 10 sec each Supine hip flexor  stretch with Lt left leg handing off plinth 2 x 30 sec     11/20/2023 NuStep Level 5 6 mins- PT present to discuss status/progress and proper alignment while doing Nustep Lateral band walks in // bars with yellow loop x 5 laps Fwd backward walking with yellow loop x 5 laps Standing hip extension and abduction with 7 lb ankle weights 2 x 10  Dynamic SLS with cone touch (foot on floor, cone on counter top 3 x 10 each LE Dynamic SLS with 1D ball toss at rebounder 2 x 20 each LE Supine clamshell with yellow loop 2 x 10  Side lying clamshell with yellow loop 2 x 10 Supine SLR 2 x 10 each LE Supine hip abduction 2 x 10 with towel under heel Supine IT band stretch 5 x 10 sec each left Supine adductor  stretch 5 x 10 sec each left  Supine butterfly stretch x 10 holding 10 sec each  11/13/2023 NuStep Level 5 6 mins- PT present to discuss status Leg Press 95# bilateral 2 x 10; 85# (Rt ) 70# (Lt)  x 20 bilateral (seat 7) Side stepping in // with red TB around ankles x4 laps Sit to stand + chest press holding 10 # 2 x10 10# DB waist height + march x 20 marches each hand Resisted walking with belt 10# (forwards backwards & sideways) x 7 laps each Seated clamshell with red loop 2 x 10 Supine  clamshell with red loop 2 x 10  Side lying clamshell with yellow loop 2 x 10 8 inch step ups holding 10# DB unilateral 2 x 10 bilateral   10/24/2023 NuStep Level 5 6 mins- PT present to discuss status Side stepping in // with red TB around ankles x4 laps 8 inch step ups holding 10# DB unilateral x 10 bilateral  8 inch lateral step ups holding 10# DB unilateral x 10 bilateral  Sit to stand + chest press holding 10 # 2 x10 Leg Press 95# bilateral 2 x 10; 85# (Rt ) 70# (Lt)  x 20 bilateral (seat 7) 10# DB waist height + march x 20 marches each hand Resisted walking with belt 10# (forwards backwards & sideways) x 6 laps each Lunges to BOSU x 12 bilateral   PATIENT EDUCATION:  Education details: Ice; POC;  examination findings; HEP Person educated: Patient Education method: Explanation, Demonstration, and Handouts Education comprehension: verbalized understanding, returned demonstration, and needs further education  HOME EXERCISE PROGRAM: Access Code: [email or web address] URL: [email or web address] Date: 11/26/2023 Prepared by: Kristeen Sar  Exercises - Supine Gluteus Stretch  - 1 x daily - 7 x weekly - 2 sets - 20-30 hold - Supine Hamstring Stretch with Strap  - 1 x daily - 7 x weekly - 2 sets - 20-30 hold - Supine ITB Stretch with Strap  - 1 x daily - 7 x weekly - 2 sets - 20-30 hold - Seated Long Arc Quad  - 1 x daily - 7 x weekly - 2 sets - 10 reps - 2 hold - Standing Hamstring Stretch on Chair  - 1 x daily - 7 x weekly - 1 sets - 3 reps - 30 sec hold - Quadricep Stretch with Chair and Counter Support  - 1 x daily - 7 x weekly - 1 sets - 3 reps - 30 sec hold - Seated Figure 4 Piriformis Stretch  - 1 x daily - 7 x weekly - 1 sets - 3 reps - 30 sed hold - Standing 3-Way Leg Reach with Resistance at Ankles and Unilateral Counter Support  - 1 x daily - 7 x weekly - 2 sets - 10 reps - Modified Single Leg Balance with Kickstand  - 1 x daily - 7 x weekly - 2 sets - 10 reps ASSESSMENT:  CLINICAL IMPRESSION: Treatment session focused on single limb balance and hip strengthening. Noted improved performance with single leg cone tap exercise. Added this exercise to HEP for patient to perform with the back leg as a kickstand. Resisted walking was a good challenge for patient today. Left side stepping was particularly challenging. PT monitored patient throughout and provided verbal and visual cues as needed. Patient will benefit from skilled PT to address the below impairments and improve overall function.      OBJECTIVE IMPAIRMENTS: Abnormal gait, decreased balance, difficulty walking, decreased ROM, decreased strength, increased muscle spasms, impaired flexibility, postural dysfunction, and  pain.   ACTIVITY LIMITATIONS: carrying, bending, dressing, and locomotion level  PARTICIPATION LIMITATIONS: cleaning, laundry, community activity, school, and camping/ hiking  PERSONAL FACTORS: Age, Fitness, Time since onset of injury/illness/exacerbation, and 3+ comorbidities: Left femur fx May 2024; Type 2 DB;  peripheral neuropathy; HTN; are also affecting patient's functional outcome.   REHAB POTENTIAL: Good  CLINICAL DECISION MAKING: Stable/uncomplicated  EVALUATION COMPLEXITY: Moderate   GOALS: Goals reviewed with patient? Yes  SHORT TERM GOALS: Target date: 09/26/2023  Patient will be independent with initial HEP. Baseline:  Goal status: MET 10/18/2023  2.  Patient will be able to walk 0.5 miles without left him pain or discomfort. Baseline: increased pain and has to stop Goal status: MET 10/02/23  3.  Patient will ambulate > or = to 1,000 feet during for improved community ambulation. Baseline: 880ft Goal status: MET 10/18/2023    LONG TERM GOALS: Target date: 12/24/2023  Patient will demonstrate independence in advanced HEP. Baseline:  Goal status: IN PROGRESS 10/22/2023  2.  Patient will be able to walk a mile with no left hip discomfort for improved cardiovascular endurance. Baseline: unable to walk 1 mile Goal status: MET 10/22/2023  3.  Patient will verbalize and demonstrate self-care strategies to manage pain including tissue mobility practices and change of position. Baseline:  Goal status: IN PROGRESS 10/22/2023  4.  Patient will be able to bend and put on his socks and shoes with no left hip discomfort. Baseline: 2/10 pain Goal status: IN PROGRESS (still challenging) 10/22/2023  5.  Patient will perform TUG in < or = to 9 sec to decrease risk of falls. Baseline: 12.27sec Goal status: MET 10/22/2023  6.  Patient will score 48/80 on LEFS due to improved function of Lt LE. Baseline: 38/80  Goal status: MET 10/22/2023    7.  Patient will ambulate > or = to  1124ft during to be within 60% of age expected norms for test and improve community negotiation.  Baseline: 1083ft  Goal status:NEW   PLAN:  PT FREQUENCY: 1-2x/week  PT DURATION: 6 weeks starting 11/12/2023  PLANNED INTERVENTIONS: 02835- PT Re-evaluation, 97110-Therapeutic exercises, 97530- Therapeutic activity, 97112- Neuromuscular re-education, 97535- Self Care, 02859- Manual therapy, [phone]- Gait training, [phone]- Canalith repositioning, [id number]- Aquatic Therapy, [phone]- Electrical stimulation (unattended), [phone]- Electrical stimulation (manual), [id number]- Ionotophoresis 4mg /ml Dexamethasone , 20560 (1-2 muscles), 20561 (3+ muscles)- Dry Needling, Patient/Family education, Balance training, Stair training, Taping, Joint mobilization, Joint manipulation, Spinal manipulation, Spinal mobilization, Vestibular training, Cryotherapy, and Moist heat  PLAN FOR NEXT SESSION: Continue single leg strength & balance; continue hip strengthening & balance training    Kristeen Sar, PT 11/26/23 1:18 PM [facility] [address] [place], [place] [address] Phone # [phone] Fax [phone]

## 2023-11-27 ENCOUNTER — Encounter: Admitting: Physical Therapy

## 2023-12-04 ENCOUNTER — Ambulatory Visit: Admitting: Physical Therapy

## 2023-12-04 ENCOUNTER — Encounter: Payer: Self-pay | Admitting: Physical Therapy

## 2023-12-04 DIAGNOSIS — R262 Difficulty in walking, not elsewhere classified: Secondary | ICD-10-CM

## 2023-12-04 DIAGNOSIS — M25552 Pain in left hip: Secondary | ICD-10-CM

## 2023-12-04 DIAGNOSIS — M6281 Muscle weakness (generalized): Secondary | ICD-10-CM

## 2023-12-04 NOTE — Therapy (Signed)
 OUTPATIENT PHYSICAL THERAPY LOWER EXTREMITY TREATMENT   Patient Name: Alejandro Foster MRN: [id number] DOB:05-Jun-1933, 89 y.o., male Today's Date: 12/04/2023  END OF SESSION:  PT End of Session - 12/04/23 1108     Visit Number 14    Number of Visits 16    Date for PT Re-Evaluation 12/24/23    Authorization Type [employer] approved 6 more visits 10/25/23 through 12/06/23    Authorization Time Period 10/24/24-12/06/23    Authorization - Visit Number 14    Authorization - Number of Visits 16    Progress Note Due on Visit 20    PT Start Time 1017    PT Stop Time 1059    PT Time Calculation (min) 42 min    Activity Tolerance Patient tolerated treatment well    Behavior During Therapy [facility] for tasks assessed/performed                    Past Medical History:  Diagnosis Date   Anemia    Arthritis    Hands   Bladder cancer (HCC)    Chronic bilateral thoracic back pain 12/12/2017   Complication of anesthesia    Hard time waking up in the past   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) 11/10/2019   Coronary artery disease    cardiologist-  dr blanca--  s/p  cardiac cath's w/ angioplasty and stenting x2   DDD (degenerative disc disease), lumbar    Depression    Diabetic peripheral neuropathy associated with type 2 diabetes mellitus (HCC) 12/12/2017   Dyslipidemia 01/25/2018   Dyspnea on exertion 08/22/2019   Foraminal stenosis of lumbar region 12/12/2017   GERD (gastroesophageal reflux disease)    History of kidney stones    Hyperlipidemia    Hypertension    Lumbar radiculopathy, right    right leg weakness   Myofascial pain syndrome 12/12/2017   Nephrolithiasis    bilateral non-obstructive per ct 01-19-2016   Peripheral neuropathy    feet   Posterior vitreous detachment of both eyes 11/10/2019   Pseudophakia of both eyes 11/10/2019   Right leg weakness    due to lumbar radiculopathy   Right ureteral stone    S/P  coronary artery stent placement    2002 x1  and 2003 x1   Spondylosis without myelopathy or radiculopathy, lumbar region 12/12/2017   Trigger finger 05/28/2018   Type 2 diabetes mellitus (HCC)    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) 11/10/2019   Wears hearing aid    BILATERAL   Past Surgical History:  Procedure Laterality Date   BIOPSY  07/11/2022   Procedure: BIOPSY;  Surgeon: Saintclair Jasper, MD;  Location: [facility] ENDOSCOPY;  Service: Gastroenterology;;   BOTOX  INJECTION N/A 03/20/2023   Procedure: BOTOX  INJECTION;  Surgeon: Saintclair Jasper, MD;  Location: [facility] ENDOSCOPY;  Service: Gastroenterology;  Laterality: N/A;   CARDIAC CATHETERIZATION  01/ 2002   dr blanca   mLAD 30%,  CFX OM3 40%,  RCA 30% previous stent site   CARDIOVASCULAR STRESS TEST  04-15-2009  dr blanca   normal nuclear study w/ no ischemia/  normal LV function and wall motion , ef 77%   CATARACT EXTRACTION W/ INTRAOCULAR LENS  IMPLANT, BILATERAL  2013  approx.   COLONOSCOPY WITH PROPOFOL   2014   CORONARY ANGIOPLASTY  1990  dr blanca   PTCA to OM2   CORONARY ANGIOPLASTY  10/ 1996  dr blanca   PTCA to RCA   CORONARY ANGIOPLASTY WITH STENT PLACEMENT  04/ 1996  dr blanca   stenting to RCA   CYSTOSCOPY WITH URETEROSCOPY AND STENT PLACEMENT Right 03/28/2016   Procedure: CYSTOSCOPY WITH RIGHT  URETEROSCOPY AND STENT PLACEMENT;  Surgeon: Donnice Brooks, MD;  [facility]  Service: Urology;  Laterality: Right;   ESOPHAGOGASTRODUODENOSCOPY (EGD) WITH PROPOFOL  N/A 07/11/2022   Procedure: ESOPHAGOGASTRODUODENOSCOPY (EGD) WITH PROPOFOL ;  Surgeon: Saintclair Jasper, MD;  Location: [facility] ENDOSCOPY;  Service: Gastroenterology;  Laterality: N/A;   ESOPHAGOGASTRODUODENOSCOPY (EGD) WITH PROPOFOL  N/A 03/20/2023   Procedure: ESOPHAGOGASTRODUODENOSCOPY (EGD) WITH PROPOFOL ;  Surgeon: Saintclair Jasper, MD;  Location: [facility] ENDOSCOPY;  Service: Gastroenterology;  Laterality: N/A;   with botox  injection s   EXTRACORPOREAL SHOCK WAVE LITHOTRIPSY  yrs ago   HOLMIUM LASER APPLICATION Right 03/28/2016   Procedure: HOLMIUM LASER APPLICATION;  Surgeon: Donnice Brooks, MD;  Location: [facility];  Service: Urology;  Laterality: Right;   INTRAMEDULLARY (IM) NAIL INTERTROCHANTERIC Left 07/07/2022   Procedure: INTRAMEDULLARY (IM) NAILING OF LEFT FEMUR;  Surgeon: Jerri Kay HERO, MD;  Location: [facility] OR;  Service: Orthopedics;  Laterality: Left;   LAPAROSCOPIC CHOLECYSTECTOMY  1990's   SUBMUCOSAL INJECTION  07/11/2022   Procedure: SUBMUCOSAL INJECTION;  Surgeon: Saintclair Jasper, MD;  Location: [facility] ENDOSCOPY;  Service: Gastroenterology;;   TONSILLECTOMY AND ADENOIDECTOMY  child   URETEROLITHOTOMY  1990's   Patient Active Problem List   Diagnosis Date Noted   Fall 07/06/2022   Displaced intertrochanteric fracture of left femur, initial encounter for closed fracture (HCC) 07/06/2022   CAD (coronary artery disease) 07/06/2022   Coronary artery disease 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) 05/12/2022   Paving stone retinal degeneration, bilateral 11/03/2021   Senile purpura (HCC) 09/07/2020   Claudication (HCC) 03/17/2020   Wears hearing aid    Type 2 diabetes mellitus (HCC)    S/P coronary artery stent placement    Right ureteral stone    Right leg weakness    Lumbar radiculopathy, right    History of kidney stones    DDD (degenerative disc disease), lumbar    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) 11/10/2019   Pseudophakia of both eyes 11/10/2019   Posterior vitreous detachment of both eyes 11/10/2019   Amnesia 09/19/2019   Dyspnea on exertion 08/22/2019   Atypical chest pain 08/22/2019   Diabetic retinopathy associated with type 2 diabetes mellitus (HCC) 03/27/2019   Trigger finger 05/28/2018   Dyslipidemia 01/25/2018   Diabetic peripheral neuropathy associated with type 2  diabetes mellitus (HCC) 12/12/2017   Chronic bilateral thoracic back pain 12/12/2017   Myofascial pain syndrome 12/12/2017   Foraminal stenosis of lumbar region 12/12/2017   Spondylosis without myelopathy or radiculopathy, lumbar region 12/12/2017   Cough 09/17/2017   Irritability and anger 09/17/2017   Localized edema 08/01/2017   Abnormal gait 01/09/2017   Fatigue 09/12/2016   Hypertensive heart disease without congestive heart failure 09/03/2014   Long term (current) use of insulin  (HCC) 09/03/2014   Ventricular premature depolarization 09/03/2014   Encounter for general adult medical examination without abnormal findings 08/26/2014   Gout 08/28/2013   Impacted cerumen 08/28/2013   Hypo-osmolality and hyponatremia 08/28/2013   Urge incontinence of urine 08/26/2012   Age-related osteoporosis without current pathological fracture 02/19/2012   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   Vitamin D deficiency 02/19/2012   Underimmunization status 12/26/2011   Backache 08/21/2011  Hearing loss 05/23/2011   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) 03/22/2010   Kidney stone 05/25/2009   ED (erectile dysfunction) of organic origin 05/25/2009   Obesity 05/25/2009   Metabolic syndrome 05/25/2009   Polyneuropathy 12/25/2008   Hyperglycemia due to type 2 diabetes mellitus (HCC) 12/25/2008   Gastro-esophageal reflux disease without esophagitis 12/25/2008   Insomnia 12/25/2008   Dilatation of aorta (HCC) 10/02/2008   Essential hypertension 10/02/2008   Hyperlipidemia 10/02/2008    PCP: Onita Rush, MD   REFERRING PROVIDER: Jerri Kay HERO, MD  REFERRING DIAG: [phone] (ICD-10-CM) - Displaced intertrochanteric fracture of left femur, initial encounter for closed fracture [facility])  THERAPY DIAG:  Pain in left hip  Muscle weakness (generalized)  Difficulty in walking, not elsewhere classified  Rationale for Evaluation and Treatment:  Rehabilitation  ONSET DATE: Jul 19 2022  SUBJECTIVE:   SUBJECTIVE STATEMENT: Patient reports he is doing good today. He had increased hip soreness yesterday.  From Eval: Patient presents with left hip pain that began when he broke his femur Jul 19 2022.  He was in the hospital and did PT at [facility]. He has been traveling and he has gain some weight and he feels he is limping more. Patient reports he had imaging done today and every has healed fine there is just some inflammation. It has been challenging to putting on his shoes, bending, and he feels his balance is off. Previously he was able to walk a mile and now he is limited to 0.5 miles.   PERTINENT HISTORY: Left femur fx May 2024; Type 2 DB; lumbar DDD; peripheral neuropathy; HTN; PAIN:  11/20/23 Are you having pain? Yes: NPRS scale: 0/ 10 Pain location: lateral femur; where his scares or located Pain description: constant soreness; achy Aggravating factors: bending, walking Relieving factors: Tylenol    PRECAUTIONS: None  RED FLAGS: None   WEIGHT BEARING RESTRICTIONS: No  FALLS:  Has patient fallen in last 6 months? Yes. Number of falls 1 he fell in the hospital walking with his walker  LIVING ENVIRONMENT: Lives with: lives with their spouse Lives in: House/apartment Stairs: Yes: Internal: 12 steps; on left going up Has following equipment at home: Walking cane  OCCUPATION: Retired Nutritional therapist  PLOF: Independent, Independent with basic ADLs, Independent with household mobility without device, Independent with community mobility without device, Independent with gait, and Independent with transfers  PATIENT GOALS: To be able to go camping again and be about to walk without a limp  NEXT MD VISIT: PRN  OBJECTIVE:  Note: Objective measures were completed at Evaluation unless otherwise noted.  DIAGNOSTIC FINDINGS: No final results yet  PATIENT SURVEYS:  LEFS: 38/80 47.5%  10/22/2023 LEFS: 56/80  70%  12/04/2023 LEFS 51/80 63.7%  COGNITION: Overall cognitive status: Within functional limits for tasks assessed     SENSATION: WFL   MUSCLE LENGTH: Hamstrings: limited bilateral    POSTURE: rounded shoulders and forward head  PALPATION: Tenderness to palpation Lt TFL, glutes, and quads  LOWER EXTREMITY ROM: WFL; limited Lt hip IR/ER   LOWER EXTREMITY MMT:  MMT Right eval Left eval  Hip flexion 4+ 4-*  Hip extension    Hip abduction 4+ 4-  Hip adduction    Hip internal rotation    Hip external rotation    Knee flexion 4+ 4+  Knee extension 4+ 4shaky  Ankle dorsiflexion    Ankle plantarflexion    Ankle inversion    Ankle eversion     (  Blank rows = not tested)    FUNCTIONAL TESTS:  5 times sit to stand: 10.67 sec no UE support Timed up and go (TUG): 12.27 6 minute walk test: 892FT ; groin pain after 5 mins;   10/18/2023 1078ft . Had some left hip soreness after 4: 30  10/22/2023 TUG: 8.77ft  12/04/2023 928 ft. Patient verbalized pain in both hip joints while walking   GAIT: Distance walked: 892 FT Assistive device utilized: None Level of assistance: Complete Independence Comments: decreased step length on Rt; decreased stance time on Lt ; antaglic gait                                                                                                                              TREATMENT DATE:  12/04/2023 NuStep Level 5 6 mins- PT present to discuss status/progress and proper alignment while doing Nustep 12/04/2023 LEFS 51/80 63.7% Goal Assessment : 928 ft. Patient verbalized pain in both hip joints while walking Hip Matrix : (abduction, flexion, extension ) 45# 2 x 10 bilateral  Leg Press 150# bilateral 2 x 10; (able to do 100# on Right) 80# (Lt) x 10 each (seat 7) Standing hamstring stretch with 8 inch step 2 x 30 sec bilateral     11/26/2023 NuStep Level 5 6 mins- PT present to discuss status/progress and proper alignment while doing  Nustep Lateral band walks in // bars with yellow loop x 4 laps Standing clam with yellow loop around knees 3 x 10 bilateral  Dynamic SLS with cone touch (foot on floor, cone on counter top 3 x 10 each LE Leg Press 100# bilateral 2 x 10; (able to do 100# on Right) 80# (Lt) x 20 each (seat 7) Resisted walking with belt 10# (forwards backwards & sideways) x 6 laps each Supine clamshell with red loop 2 x 10  Side lying clamshell with red loop 2 x 10 Supine IT band stretch 5 x 10 sec each left Supine adductor  stretch 5 x 10 sec each left  Supine butterfly stretch x 5 holding 10 sec each Supine hip flexor stretch with Lt left leg handing off plinth 2 x 30 sec     11/20/2023 NuStep Level 5 6 mins- PT present to discuss status/progress and proper alignment while doing Nustep Lateral band walks in // bars with yellow loop x 5 laps Fwd backward walking with yellow loop x 5 laps Standing hip extension and abduction with 7 lb ankle weights 2 x 10  Dynamic SLS with cone touch (foot on floor, cone on counter top 3 x 10 each LE Dynamic SLS with 1D ball toss at rebounder 2 x 20 each LE Supine clamshell with yellow loop 2 x 10  Side lying clamshell with yellow loop 2 x 10 Supine SLR 2 x 10 each LE Supine hip abduction 2 x 10 with towel under heel Supine IT band stretch 5 x 10 sec each left Supine adductor  stretch 5 x 10 sec each left  Supine butterfly stretch x 10 holding 10 sec each  11/13/2023 NuStep Level 5 6 mins- PT present to discuss status Leg Press 95# bilateral 2 x 10; 85# (Rt ) 70# (Lt)  x 20 bilateral (seat 7) Side stepping in // with red TB around ankles x4 laps Sit to stand + chest press holding 10 # 2 x10 10# DB waist height + march x 20 marches each hand Resisted walking with belt 10# (forwards backwards & sideways) x 7 laps each Seated clamshell with red loop 2 x 10 Supine clamshell with red loop 2 x 10  Side lying clamshell with yellow loop 2 x 10 8 inch step ups holding 10# DB  unilateral 2 x 10 bilateral    PATIENT EDUCATION:  Education details: Ice; POC; examination findings; HEP Person educated: Patient Education method: Explanation, Demonstration, and Handouts Education comprehension: verbalized understanding, returned demonstration, and needs further education  HOME EXERCISE PROGRAM: Access Code: [email or web address] URL: [email or web address] Date: 11/26/2023 Prepared by: Kristeen Sar  Exercises - Supine Gluteus Stretch  - 1 x daily - 7 x weekly - 2 sets - 20-30 hold - Supine Hamstring Stretch with Strap  - 1 x daily - 7 x weekly - 2 sets - 20-30 hold - Supine ITB Stretch with Strap  - 1 x daily - 7 x weekly - 2 sets - 20-30 hold - Seated Long Arc Quad  - 1 x daily - 7 x weekly - 2 sets - 10 reps - 2 hold - Standing Hamstring Stretch on Chair  - 1 x daily - 7 x weekly - 1 sets - 3 reps - 30 sec hold - Quadricep Stretch with Chair and Counter Support  - 1 x daily - 7 x weekly - 1 sets - 3 reps - 30 sec hold - Seated Figure 4 Piriformis Stretch  - 1 x daily - 7 x weekly - 1 sets - 3 reps - 30 sed hold - Standing 3-Way Leg Reach with Resistance at Ankles and Unilateral Counter Support  - 1 x daily - 7 x weekly - 2 sets - 10 reps - Modified Single Leg Balance with Kickstand  - 1 x daily - 7 x weekly - 2 sets - 10 reps ASSESSMENT:  CLINICAL IMPRESSION: Mr. Steil verbalized feeling 85-90% better since starting skilled therapy. He is still challenged with bending to put on his shoes and tying his shoes. When hiking he has been having pain in his hip joints that is relieved when he takes a rest. During his he verbalized the same hip discomfort while ambulating. He ambulated a few feet less than last administration. Patient ambulated with a slower cadence today. His LEFS decreased some which PT attributes to mild increase in hip pain. Patient would benefit from continued therapy to meet remaining goals, decrease falls risk, and improve performance of  ADLS.    OBJECTIVE IMPAIRMENTS: Abnormal gait, decreased balance, difficulty walking, decreased ROM, decreased strength, increased muscle spasms, impaired flexibility, postural dysfunction, and pain.   ACTIVITY LIMITATIONS: carrying, bending, dressing, and locomotion level  PARTICIPATION LIMITATIONS: cleaning, laundry, community activity, school, and camping/ hiking  PERSONAL FACTORS: Age, Fitness, Time since onset of injury/illness/exacerbation, and 3+ comorbidities: Left femur fx May 2024; Type 2 DB;  peripheral neuropathy; HTN; are also affecting patient's functional outcome.   REHAB POTENTIAL: Good  CLINICAL DECISION MAKING: Stable/uncomplicated  EVALUATION COMPLEXITY: Moderate   GOALS: Goals reviewed with patient? Yes  SHORT TERM  GOALS: Target date: 09/26/2023  Patient will be independent with initial HEP. Baseline:  Goal status: MET 10/18/2023  2.  Patient will be able to walk 0.5 miles without left him pain or discomfort. Baseline: increased pain and has to stop Goal status: MET 10/02/23  3.  Patient will ambulate > or = to 1,000 feet during for improved community ambulation. Baseline: 828ft Goal status: MET 10/18/2023    LONG TERM GOALS: Target date: 12/24/2023  Patient will demonstrate independence in advanced HEP. Baseline:  Goal status: IN PROGRESS 12/04/2023  2.  Patient will be able to walk a mile with no left hip discomfort for improved cardiovascular endurance. Baseline: unable to walk 1 mile Goal status: MET 10/22/2023  3.  Patient will verbalize and demonstrate self-care strategies to manage pain including tissue mobility practices and change of position. Baseline:  Goal status: IN PROGRESS 12/04/2023  4.  Patient will be able to bend and put on his socks and shoes with no left hip discomfort. Baseline: 2/10 pain Goal status: IN PROGRESS (still challenging. Can do it standing up now. Balance is better) 12/04/2023  5.  Patient will perform TUG in < or  = to 9 sec to decrease risk of falls. Baseline: 12.27sec Goal status: MET 10/22/2023  6.  Patient will score 48/80 on LEFS due to improved function of Lt LE. Baseline: 38/80  Goal status: MET 10/22/2023    7.  Patient will ambulate > or = to 1150ft during to be within 60% of age expected norms for test and improve community negotiation.  Baseline: 106ft  Goal status:IN PROGRESS (928)   PLAN:  PT FREQUENCY: 1-2x/week  PT DURATION: 6 weeks starting 11/12/2023  PLANNED INTERVENTIONS: 97164- PT Re-evaluation, 97110-Therapeutic exercises, 97530- Therapeutic activity, 97112- Neuromuscular re-education, 97535- Self Care, 02859- Manual therapy, [phone]- Gait training, [phone]- Canalith repositioning, [id number]- Aquatic Therapy, [phone]- Electrical stimulation (unattended), [phone]- Electrical stimulation (manual), [id number]- Ionotophoresis 4mg /ml Dexamethasone , 20560 (1-2 muscles), 20561 (3+ muscles)- Dry Needling, Patient/Family education, Balance training, Stair training, Taping, Joint mobilization, Joint manipulation, Spinal manipulation, Spinal mobilization, Vestibular training, Cryotherapy, and Moist heat  PLAN FOR NEXT SESSION: (auth requested 9/16); Hip IR/ER; Continue single leg strength & balance; continue hip strengthening & balance training    Kristeen Sar, PT 12/04/23 11:10 AM [facility] [address] [place], [place] [address] Phone # [phone] Fax [phone]

## 2023-12-11 ENCOUNTER — Encounter: Payer: Self-pay | Admitting: Physical Therapy

## 2023-12-11 ENCOUNTER — Ambulatory Visit: Admitting: Physical Therapy

## 2023-12-11 DIAGNOSIS — M25552 Pain in left hip: Secondary | ICD-10-CM

## 2023-12-11 DIAGNOSIS — M6281 Muscle weakness (generalized): Secondary | ICD-10-CM

## 2023-12-11 DIAGNOSIS — R262 Difficulty in walking, not elsewhere classified: Secondary | ICD-10-CM

## 2023-12-11 NOTE — Therapy (Signed)
 OUTPATIENT PHYSICAL THERAPY LOWER EXTREMITY TREATMENT   Patient Name: Alejandro Foster MRN: [id number] DOB:22-Sep-1933, 89 y.o., male Today's Date: 12/11/2023  END OF SESSION:  PT End of Session - 12/11/23 1115     Visit Number 15    Date for Recertification  12/24/23    Authorization Type [employer] approved 6 more visits 10/25/23 through 12/06/23    Authorization Time Period 10/24/24-12/06/23    Authorization - Visit Number 15    Authorization - Number of Visits 16    Progress Note Due on Visit 20    PT Start Time 1023    PT Stop Time 1101    PT Time Calculation (min) 38 min    Activity Tolerance Patient tolerated treatment well    Behavior During Therapy [facility] for tasks assessed/performed                     Past Medical History:  Diagnosis Date   Anemia    Arthritis    Hands   Bladder cancer (HCC)    Chronic bilateral thoracic back pain 12/12/2017   Complication of anesthesia    Hard time waking up in the past   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) 11/10/2019   Coronary artery disease    cardiologist-  dr blanca--  s/p  cardiac cath's w/ angioplasty and stenting x2   DDD (degenerative disc disease), lumbar    Depression    Diabetic peripheral neuropathy associated with type 2 diabetes mellitus (HCC) 12/12/2017   Dyslipidemia 01/25/2018   Dyspnea on exertion 08/22/2019   Foraminal stenosis of lumbar region 12/12/2017   GERD (gastroesophageal reflux disease)    History of kidney stones    Hyperlipidemia    Hypertension    Lumbar radiculopathy, right    right leg weakness   Myofascial pain syndrome 12/12/2017   Nephrolithiasis    bilateral non-obstructive per ct 01-19-2016   Peripheral neuropathy    feet   Posterior vitreous detachment of both eyes 11/10/2019   Pseudophakia of both eyes 11/10/2019   Right leg weakness    due to lumbar radiculopathy   Right ureteral stone    S/P coronary artery stent  placement    2002 x1  and 2003 x1   Spondylosis without myelopathy or radiculopathy, lumbar region 12/12/2017   Trigger finger 05/28/2018   Type 2 diabetes mellitus (HCC)    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) 11/10/2019   Wears hearing aid    BILATERAL   Past Surgical History:  Procedure Laterality Date   BIOPSY  07/11/2022   Procedure: BIOPSY;  Surgeon: Saintclair Jasper, MD;  Location: [facility] ENDOSCOPY;  Service: Gastroenterology;;   BOTOX  INJECTION N/A 03/20/2023   Procedure: BOTOX  INJECTION;  Surgeon: Saintclair Jasper, MD;  Location: [facility] ENDOSCOPY;  Service: Gastroenterology;  Laterality: N/A;   CARDIAC CATHETERIZATION  01/ 2002   dr blanca   mLAD 30%,  CFX OM3 40%,  RCA 30% previous stent site   CARDIOVASCULAR STRESS TEST  04-15-2009  dr blanca   normal nuclear study w/ no ischemia/  normal LV function and wall motion , ef 77%   CATARACT EXTRACTION W/ INTRAOCULAR LENS  IMPLANT, BILATERAL  2013  approx.   COLONOSCOPY WITH PROPOFOL   2014   CORONARY ANGIOPLASTY  1990  dr blanca   PTCA to OM2   CORONARY ANGIOPLASTY  10/ 1996  dr blanca   PTCA  to RCA   CORONARY ANGIOPLASTY WITH STENT PLACEMENT  04/ 1996  dr blanca   stenting to RCA   CYSTOSCOPY WITH URETEROSCOPY AND STENT PLACEMENT Right 03/28/2016   Procedure: CYSTOSCOPY WITH RIGHT  URETEROSCOPY AND STENT PLACEMENT;  Surgeon: Donnice Brooks, MD;  Location: [facility];  Service: Urology;  Laterality: Right;   ESOPHAGOGASTRODUODENOSCOPY (EGD) WITH PROPOFOL  N/A 07/11/2022   Procedure: ESOPHAGOGASTRODUODENOSCOPY (EGD) WITH PROPOFOL ;  Surgeon: Saintclair Jasper, MD;  Location: [facility] ENDOSCOPY;  Service: Gastroenterology;  Laterality: N/A;   ESOPHAGOGASTRODUODENOSCOPY (EGD) WITH PROPOFOL  N/A 03/20/2023   Procedure: ESOPHAGOGASTRODUODENOSCOPY (EGD) WITH PROPOFOL ;  Surgeon: Saintclair Jasper, MD;  Location: [facility] ENDOSCOPY;  Service: Gastroenterology;  Laterality: N/A;  with botox  injection s    EXTRACORPOREAL SHOCK WAVE LITHOTRIPSY  yrs ago   HOLMIUM LASER APPLICATION Right 03/28/2016   Procedure: HOLMIUM LASER APPLICATION;  Surgeon: Donnice Brooks, MD;  Location: [facility];  Service: Urology;  Laterality: Right;   INTRAMEDULLARY (IM) NAIL INTERTROCHANTERIC Left 07/07/2022   Procedure: INTRAMEDULLARY (IM) NAILING OF LEFT FEMUR;  Surgeon: Jerri Kay HERO, MD;  Location: [facility] OR;  Service: Orthopedics;  Laterality: Left;   LAPAROSCOPIC CHOLECYSTECTOMY  1990's   SUBMUCOSAL INJECTION  07/11/2022   Procedure: SUBMUCOSAL INJECTION;  Surgeon: Saintclair Jasper, MD;  Location: [facility] ENDOSCOPY;  Service: Gastroenterology;;   TONSILLECTOMY AND ADENOIDECTOMY  child   URETEROLITHOTOMY  1990's   Patient Active Problem List   Diagnosis Date Noted   Fall 07/06/2022   Displaced intertrochanteric fracture of left femur, initial encounter for closed fracture (HCC) 07/06/2022   CAD (coronary artery disease) 07/06/2022   Coronary artery disease of bypass graft of native heart with stable angina pectoris 05/12/2022   Paving stone retinal degeneration, bilateral 11/03/2021   Senile purpura 09/07/2020   Claudication 03/17/2020   Wears hearing aid    Type 2 diabetes mellitus (HCC)    S/P coronary artery stent placement    Right ureteral stone    Right leg weakness    Lumbar radiculopathy, right    History of kidney stones    DDD (degenerative disc disease), lumbar    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) 11/10/2019   Pseudophakia of both eyes 11/10/2019   Posterior vitreous detachment of both eyes 11/10/2019   Amnesia 09/19/2019   Dyspnea on exertion 08/22/2019   Atypical chest pain 08/22/2019   Diabetic retinopathy associated with type 2 diabetes mellitus (HCC) 03/27/2019   Trigger finger 05/28/2018   Dyslipidemia 01/25/2018   Diabetic peripheral neuropathy associated with type 2 diabetes mellitus (HCC) 12/12/2017    Chronic bilateral thoracic back pain 12/12/2017   Myofascial pain syndrome 12/12/2017   Foraminal stenosis of lumbar region 12/12/2017   Spondylosis without myelopathy or radiculopathy, lumbar region 12/12/2017   Cough 09/17/2017   Irritability and anger 09/17/2017   Localized edema 08/01/2017   Abnormal gait 01/09/2017   Fatigue 09/12/2016   Hypertensive heart disease without congestive heart failure 09/03/2014   Long term (current) use of insulin  (HCC) 09/03/2014   Ventricular premature depolarization 09/03/2014   Encounter for general adult medical examination without abnormal findings 08/26/2014   Gout 08/28/2013   Impacted cerumen 08/28/2013   Hypo-osmolality and hyponatremia 08/28/2013   Urge incontinence of urine 08/26/2012   Age-related osteoporosis without current pathological fracture 02/19/2012   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   Vitamin D deficiency 02/19/2012   Underimmunization status 12/26/2011   Backache 08/21/2011   Hearing loss 05/23/2011   Type  2 diabetes mellitus with other diabetic neurological complication (HCC) 03/22/2010   Kidney stone 05/25/2009   ED (erectile dysfunction) of organic origin 05/25/2009   Obesity 05/25/2009   Metabolic syndrome 05/25/2009   Polyneuropathy 12/25/2008   Hyperglycemia due to type 2 diabetes mellitus (HCC) 12/25/2008   Gastro-esophageal reflux disease without esophagitis 12/25/2008   Insomnia 12/25/2008   Dilatation of aorta 10/02/2008   Essential hypertension 10/02/2008   Hyperlipidemia 10/02/2008    PCP: Onita Rush, MD   REFERRING PROVIDER: Jerri Kay HERO, MD  REFERRING DIAG: [phone] (ICD-10-CM) - Displaced intertrochanteric fracture of left femur, initial encounter for closed fracture [facility])  THERAPY DIAG:  Pain in left hip  Muscle weakness (generalized)  Difficulty in walking, not elsewhere classified  Rationale for Evaluation and Treatment: Rehabilitation  ONSET DATE: Jul 19 2022  SUBJECTIVE:    SUBJECTIVE STATEMENT: Patient reports he is doing good today. He had increased hip soreness yesterday.  From Eval: Patient presents with left hip pain that began when he broke his femur Jul 19 2022.  He was in the hospital and did PT at [facility]. He has been traveling and he has gain some weight and he feels he is limping more. Patient reports he had imaging done today and every has healed fine there is just some inflammation. It has been challenging to putting on his shoes, bending, and he feels his balance is off. Previously he was able to walk a mile and now he is limited to 0.5 miles.   PERTINENT HISTORY: Left femur fx May 2024; Type 2 DB; lumbar DDD; peripheral neuropathy; HTN; PAIN:  11/20/23 Are you having pain? Yes: NPRS scale: 0/ 10 Pain location: lateral femur; where his scares or located Pain description: constant soreness; achy Aggravating factors: bending, walking Relieving factors: Tylenol    PRECAUTIONS: None  RED FLAGS: None   WEIGHT BEARING RESTRICTIONS: No  FALLS:  Has patient fallen in last 6 months? Yes. Number of falls 1 he fell in the hospital walking with his walker  LIVING ENVIRONMENT: Lives with: lives with their spouse Lives in: House/apartment Stairs: Yes: Internal: 12 steps; on left going up Has following equipment at home: Walking cane  OCCUPATION: Retired Nutritional therapist  PLOF: Independent, Independent with basic ADLs, Independent with household mobility without device, Independent with community mobility without device, Independent with gait, and Independent with transfers  PATIENT GOALS: To be able to go camping again and be about to walk without a limp  NEXT MD VISIT: PRN  OBJECTIVE:  Note: Objective measures were completed at Evaluation unless otherwise noted.  DIAGNOSTIC FINDINGS: No final results yet  PATIENT SURVEYS:  LEFS: 38/80 47.5%  10/22/2023 LEFS: 56/80 70%  12/04/2023 LEFS 51/80 63.7%  COGNITION: Overall cognitive  status: Within functional limits for tasks assessed     SENSATION: WFL   MUSCLE LENGTH: Hamstrings: limited bilateral    POSTURE: rounded shoulders and forward head  PALPATION: Tenderness to palpation Lt TFL, glutes, and quads  LOWER EXTREMITY ROM: WFL; limited Lt hip IR/ER   LOWER EXTREMITY MMT:  MMT Right eval Left eval  Hip flexion 4+ 4-*  Hip extension    Hip abduction 4+ 4-  Hip adduction    Hip internal rotation    Hip external rotation    Knee flexion 4+ 4+  Knee extension 4+ 4shaky  Ankle dorsiflexion    Ankle plantarflexion    Ankle inversion    Ankle eversion     (Blank rows = not tested)  FUNCTIONAL TESTS:  5 times sit to stand: 10.67 sec no UE support Timed up and go (TUG): 12.27 6 minute walk test: 892FT ; groin pain after 5 mins;   10/18/2023 1039ft . Had some left hip soreness after 4: 30  10/22/2023 TUG: 8.13ft  12/04/2023 928 ft. Patient verbalized pain in both hip joints while walking   GAIT: Distance walked: 892 FT Assistive device utilized: None Level of assistance: Complete Independence Comments: decreased step length on Rt; decreased stance time on Lt ; antaglic gait                                                                                                                              TREATMENT DATE:  12/11/2023 Sit to stand holding 7# Unilateral 7# DB hold + march x 20 each side Hip Matrix : (abduction, flexion, extension ) 45# 2 x 10 bilateral  Leg Press 150# bilateral 2 x 10; 100# unilateral x 10 6 in step ups with unilateral 7# DB hold x 20 bilateral  6 inch lateral step ups x 10 bilateral  Single leg on airex + opposite leg swing (forward, lateral, back) x 10 bilateral  Aires step ups + knee drive x 10 bilateral  Farmer's carry 9# DB x 2 laps from ortho gym to cancer rehab gym NuStep Level 4 8 mins- PT present to discuss status/progress a   12/04/2023 NuStep Level 5 6 mins- PT present to discuss  status/progress and proper alignment while doing Nustep 12/04/2023 LEFS 51/80 63.7% Goal Assessment : 928 ft. Patient verbalized pain in both hip joints while walking Hip Matrix : (abduction, flexion, extension ) 45# 2 x 10 bilateral  Leg Press 150# bilateral 2 x 10; (able to do 100# on Right) 80# (Lt) x 10 each (seat 7) Standing hamstring stretch with 8 inch step 2 x 30 sec bilateral     11/26/2023 NuStep Level 5 6 mins- PT present to discuss status/progress and proper alignment while doing Nustep Lateral band walks in // bars with yellow loop x 4 laps Standing clam with yellow loop around knees 3 x 10 bilateral  Dynamic SLS with cone touch (foot on floor, cone on counter top 3 x 10 each LE Leg Press 100# bilateral 2 x 10; (able to do 100# on Right) 80# (Lt) x 20 each (seat 7) Resisted walking with belt 10# (forwards backwards & sideways) x 6 laps each Supine clamshell with red loop 2 x 10  Side lying clamshell with red loop 2 x 10 Supine IT band stretch 5 x 10 sec each left Supine adductor  stretch 5 x 10 sec each left  Supine butterfly stretch x 5 holding 10 sec each Supine hip flexor stretch with Lt left leg handing off plinth 2 x 30 sec     11/20/2023 NuStep Level 5 6 mins- PT present to discuss status/progress and proper alignment while doing Nustep Lateral band walks in // bars  with yellow loop x 5 laps Fwd backward walking with yellow loop x 5 laps Standing hip extension and abduction with 7 lb ankle weights 2 x 10  Dynamic SLS with cone touch (foot on floor, cone on counter top 3 x 10 each LE Dynamic SLS with 1D ball toss at rebounder 2 x 20 each LE Supine clamshell with yellow loop 2 x 10  Side lying clamshell with yellow loop 2 x 10 Supine SLR 2 x 10 each LE Supine hip abduction 2 x 10 with towel under heel Supine IT band stretch 5 x 10 sec each left Supine adductor  stretch 5 x 10 sec each left  Supine butterfly stretch x 10 holding 10 sec each      PATIENT  EDUCATION:  Education details: Ice; POC; examination findings; HEP Person educated: Patient Education method: Explanation, Demonstration, and Handouts Education comprehension: verbalized understanding, returned demonstration, and needs further education  HOME EXERCISE PROGRAM: Access Code: [email or web address] URL: [email or web address] Date: 11/26/2023 Prepared by: Kristeen Sar  Exercises - Supine Gluteus Stretch  - 1 x daily - 7 x weekly - 2 sets - 20-30 hold - Supine Hamstring Stretch with Strap  - 1 x daily - 7 x weekly - 2 sets - 20-30 hold - Supine ITB Stretch with Strap  - 1 x daily - 7 x weekly - 2 sets - 20-30 hold - Seated Long Arc Quad  - 1 x daily - 7 x weekly - 2 sets - 10 reps - 2 hold - Standing Hamstring Stretch on Chair  - 1 x daily - 7 x weekly - 1 sets - 3 reps - 30 sec hold - Quadricep Stretch with Chair and Counter Support  - 1 x daily - 7 x weekly - 1 sets - 3 reps - 30 sec hold - Seated Figure 4 Piriformis Stretch  - 1 x daily - 7 x weekly - 1 sets - 3 reps - 30 sed hold - Standing 3-Way Leg Reach with Resistance at Ankles and Unilateral Counter Support  - 1 x daily - 7 x weekly - 2 sets - 10 reps - Modified Single Leg Balance with Kickstand  - 1 x daily - 7 x weekly - 2 sets - 10 reps ASSESSMENT:  CLINICAL IMPRESSION: Mr. Kataoka found out that the cramping pain in his leg is neuropathy. He has updated his dose of gabapentin  at night and that has helped his pain tremendously. He feels he as gotten a lot stronger with skilled therapy. He is challenged with single leg activities on the airex. PT monitored patient throughout session and provided verbal and visual cues as needed. Plan to discharge patient next treatment session.    OBJECTIVE IMPAIRMENTS: Abnormal gait, decreased balance, difficulty walking, decreased ROM, decreased strength, increased muscle spasms, impaired flexibility, postural dysfunction, and pain.   ACTIVITY LIMITATIONS: carrying, bending,  dressing, and locomotion level  PARTICIPATION LIMITATIONS: cleaning, laundry, community activity, school, and camping/ hiking  PERSONAL FACTORS: Age, Fitness, Time since onset of injury/illness/exacerbation, and 3+ comorbidities: Left femur fx May 2024; Type 2 DB;  peripheral neuropathy; HTN; are also affecting patient's functional outcome.   REHAB POTENTIAL: Good  CLINICAL DECISION MAKING: Stable/uncomplicated  EVALUATION COMPLEXITY: Moderate   GOALS: Goals reviewed with patient? Yes  SHORT TERM GOALS: Target date: 09/26/2023  Patient will be independent with initial HEP. Baseline:  Goal status: MET 10/18/2023  2.  Patient will be able to walk 0.5 miles without left him pain or discomfort. Baseline: increased  pain and has to stop Goal status: MET 10/02/23  3.  Patient will ambulate > or = to 1,000 feet during for improved community ambulation. Baseline: 856ft Goal status: MET 10/18/2023    LONG TERM GOALS: Target date: 12/24/2023  Patient will demonstrate independence in advanced HEP. Baseline:  Goal status: IN PROGRESS 12/04/2023  2.  Patient will be able to walk a mile with no left hip discomfort for improved cardiovascular endurance. Baseline: unable to walk 1 mile Goal status: MET 10/22/2023  3.  Patient will verbalize and demonstrate self-care strategies to manage pain including tissue mobility practices and change of position. Baseline:  Goal status: IN PROGRESS 12/04/2023  4.  Patient will be able to bend and put on his socks and shoes with no left hip discomfort. Baseline: 2/10 pain Goal status: IN PROGRESS (still challenging. Can do it standing up now. Balance is better) 12/04/2023  5.  Patient will perform TUG in < or = to 9 sec to decrease risk of falls. Baseline: 12.27sec Goal status: MET 10/22/2023  6.  Patient will score 48/80 on LEFS due to improved function of Lt LE. Baseline: 38/80  Goal status: MET 10/22/2023    7.  Patient will ambulate > or = to  1131ft during to be within 60% of age expected norms for test and improve community negotiation.  Baseline: 1071ft  Goal status:IN PROGRESS (928)   PLAN:  PT FREQUENCY: 1-2x/week  PT DURATION: 6 weeks starting 11/12/2023  PLANNED INTERVENTIONS: 97164- PT Re-evaluation, 97110-Therapeutic exercises, 97530- Therapeutic activity, 97112- Neuromuscular re-education, 97535- Self Care, 02859- Manual therapy, [phone]- Gait training, [phone]- Canalith repositioning, [id number]- Aquatic Therapy, [phone]- Electrical stimulation (unattended), [phone]- Electrical stimulation (manual), [id number]- Ionotophoresis 4mg /ml Dexamethasone , 79439 (1-2 muscles), 20561 (3+ muscles)- Dry Needling, Patient/Family education, Balance training, Stair training, Taping, Joint mobilization, Joint manipulation, Spinal manipulation, Spinal mobilization, Vestibular training, Cryotherapy, and Moist heat  PLAN FOR NEXT SESSION: check auth; plan to discharge patient next treatment session   Kristeen Sar, PT 12/11/23 11:17 AM [facility] [address], [address] [place], [place] [address] Phone # [phone] Fax [phone]

## 2023-12-17 ENCOUNTER — Ambulatory Visit (INDEPENDENT_AMBULATORY_CARE_PROVIDER_SITE_OTHER)

## 2023-12-17 ENCOUNTER — Ambulatory Visit: Attending: Cardiology | Admitting: Cardiology

## 2023-12-17 ENCOUNTER — Encounter: Payer: Self-pay | Admitting: Cardiology

## 2023-12-17 VITALS — BP 130/58 | HR 65 | Ht 68.0 in | Wt 190.0 lb

## 2023-12-17 DIAGNOSIS — I25118 Atherosclerotic heart disease of native coronary artery with other forms of angina pectoris: Secondary | ICD-10-CM

## 2023-12-17 DIAGNOSIS — R002 Palpitations: Secondary | ICD-10-CM

## 2023-12-17 DIAGNOSIS — E782 Mixed hyperlipidemia: Secondary | ICD-10-CM

## 2023-12-17 MED ORDER — METOPROLOL SUCCINATE ER 25 MG PO TB24: 25.0000 mg | ORAL_TABLET | Freq: Every day | ORAL | 3 refills | Status: AC

## 2023-12-17 MED ORDER — NITROGLYCERIN 0.4 MG SL SUBL: 0.4000 mg | SUBLINGUAL_TABLET | SUBLINGUAL | 3 refills | Status: AC | PRN

## 2023-12-17 NOTE — Patient Instructions (Signed)
 Medication Instructions:  START Metoprolol  Succinate 25 mg daily  START AS NEEDED FOR CHEST PAIN: Nitroglycerin   *If you need a refill on your cardiac medications before your next appointment, please call your pharmacy*   Testing/Procedures: 2 week zio monitor   Your physician has requested that you wear a Zio heart monitor for __14___ days. This will be mailed to your home with instructions on how to apply the monitor and how to return it when finished. Please allow 2 weeks after returning the heart monitor before our office calls you with the results.   Follow-Up: At [facility], you and your health needs are our priority.  As part of our continuing mission to provide you with exceptional heart care, our providers are all part of one team.  This team includes your primary Cardiologist (physician) and Advanced Practice Providers or APPs (Physician Assistants and Nurse Practitioners) who all work together to provide you with the care you need, when you need it.  Your next appointment:   3 month(s)  Provider:   Newman JINNY Lawrence, MD

## 2023-12-17 NOTE — Progress Notes (Signed)
 Cardiology Office Note:  .   Date:  12/17/2023  ID:  Alejandro Foster, DOB 11-11-1933, MRN [id number] PCP: Onita Rush, MD  [facility] Providers Cardiologist:  Newman Lawrence, MD PCP: Onita Rush, MD  Chief Complaint  Patient presents with   Chest Pain      History of Present Illness: .    Alejandro Foster is a 89 y.o. male with hypertension, hyperlipidemia type 2 diabetes mellitus, coronary artery disease s/p PCI with Palmar Schatz stents in 1996, mild PAD, dysphagia, mild anemia  Patient has known history of dysphagia, has been seen by GI for the same.  However, recently, he has noticed symptoms of neck pain radiating to left arm with ambulation, improved with rest.  He is still very active, recently hiked, but had to stop couple times with shortness of breath without any chest pain.  Separately, he has noticed that his smart watch has indicated A-fib a few times.  He had labs checked with PCP recently, results not available for my review today.  Vitals:   12/17/23 0839  BP: (!) 130/58  Pulse: 65  SpO2: 94%      ROS:  Review of Systems  Cardiovascular:  Positive for chest pain and dyspnea on exertion. Negative for leg swelling, palpitations and syncope.     Studies Reviewed: SABRA        Labs 09/2023: Cr 0.91  8-02/2023: Chol 167, TG 169, HDL 51, LDL 82 HbA1C 8.1% Hb 10.9  06/2022: HbA1C 7.2% Hb 10.9 Cr 0.81 K 3.1, Mg 1.5    Physical Exam:   Physical Exam Vitals and nursing note reviewed.  Constitutional:      General: He is not in acute distress. Neck:     Vascular: No JVD.  Cardiovascular:     Rate and Rhythm: Normal rate and regular rhythm.     Heart sounds: Normal heart sounds. No murmur heard. Pulmonary:     Effort: Pulmonary effort is normal.     Breath sounds: Normal breath sounds. No wheezing or rales.  Musculoskeletal:     Right lower leg: No edema.     Left lower leg: No edema.      VISIT DIAGNOSES:   ICD-10-CM   1.  Palpitations  R00.2 LONG TERM MONITOR (3-14 DAYS)    2. Coronary artery disease of native artery of native heart with stable angina pectoris  I25.118     3. Mixed hyperlipidemia  E78.2         ASSESSMENT AND PLAN: .    Alejandro Foster is a 89 y.o. male with hypertension, hyperlipidemia type 2 diabetes mellitus, coronary artery disease s/p PCI with Palmar Schatz stents in 1996, mild PAD, dysphagia, mild anemia   CAD: Recent upper chest pain and shortness of breath symptoms concerning for angina. No ischemia on stress testing (06/2022). Mildly reduced EF without signs of heart failure (04/2022). I will start metoprolol  succinate 25 mg daily and supplement nitroglycerin as needed. If symptoms do not improve, I will repeat echocardiogram and stress test at next visit. Continue aspirin , statin and current antihypertensive therapy which includes amlodipine .  Palpitations: Reportedly A-fib noticed on smart watch. Recommend 2-week Zio patch.  Type 2 diabetes mellitus: Continue follow-up with Dr. Onita.   Will obtain lab results from PCP.    Meds ordered this encounter  Medications   metoprolol  succinate (TOPROL  XL) 25 MG 24 hr tablet    Sig: Take 1 tablet (25 mg total) by mouth daily.  Dispense:  90 tablet    Refill:  3   nitroGLYCERIN (NITROSTAT) 0.4 MG SL tablet    Sig: Place 1 tablet (0.4 mg total) under the tongue every 5 (five) minutes as needed.    Dispense:  25 tablet    Refill:  3     F/u in 3 months  Signed, Newman JINNY Lawrence, MD

## 2023-12-17 NOTE — Progress Notes (Unsigned)
 Applied a 14 day Zio XT monitor to patient in the office ?

## 2023-12-18 ENCOUNTER — Ambulatory Visit: Admitting: Physical Therapy

## 2023-12-18 ENCOUNTER — Encounter: Payer: Self-pay | Admitting: Physical Therapy

## 2023-12-18 DIAGNOSIS — M25552 Pain in left hip: Secondary | ICD-10-CM

## 2023-12-18 DIAGNOSIS — R262 Difficulty in walking, not elsewhere classified: Secondary | ICD-10-CM

## 2023-12-18 DIAGNOSIS — M6281 Muscle weakness (generalized): Secondary | ICD-10-CM

## 2023-12-18 NOTE — Therapy (Signed)
 OUTPATIENT PHYSICAL THERAPY LOWER EXTREMITY TREATMENT / DISCHARGE NOTE   Patient Name: Alejandro Foster MRN: [id number] DOB:1934-01-17, 89 y.o., male Today's Date: 12/18/2023  END OF SESSION:  PT End of Session - 12/18/23 1052     Visit Number 16    Date for Recertification  12/24/23    Authorization Type [employer] Approved 3 vls 12/07/2023 - 12/28/2023, [id number].    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) 11/10/2019   Coronary artery disease    cardiologist-  dr blanca--  s/p  cardiac cath's w/ angioplasty and stenting x2   DDD (degenerative disc disease), lumbar    Depression    Diabetic peripheral neuropathy associated with type 2 diabetes mellitus (HCC) 12/12/2017   Dyslipidemia 01/25/2018   Dyspnea on exertion 08/22/2019   Foraminal stenosis of lumbar region 12/12/2017   GERD (gastroesophageal reflux disease)    History of kidney stones    Hyperlipidemia    Hypertension    Lumbar radiculopathy, right    right leg weakness   Myofascial pain syndrome 12/12/2017   Nephrolithiasis    bilateral non-obstructive per ct 01-19-2016   Peripheral neuropathy    feet   Posterior vitreous detachment of both eyes 11/10/2019   Pseudophakia of both eyes 11/10/2019   Right leg weakness    due to lumbar radiculopathy   Right ureteral stone     S/P coronary artery stent placement    2002 x1  and 2003 x1   Spondylosis without myelopathy or radiculopathy, lumbar region 12/12/2017   Trigger finger 05/28/2018   Type 2 diabetes mellitus (HCC)    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) 11/10/2019   Wears hearing aid    BILATERAL   Past Surgical History:  Procedure Laterality Date   BIOPSY  07/11/2022   Procedure: BIOPSY;  Surgeon: Saintclair Jasper, MD;  Location: [facility] ENDOSCOPY;  Service: Gastroenterology;;   BOTOX  INJECTION N/A 03/20/2023   Procedure: BOTOX  INJECTION;  Surgeon: Saintclair Jasper, MD;  Location: [facility] ENDOSCOPY;  Service: Gastroenterology;  Laterality: N/A;   CARDIAC CATHETERIZATION  01/ 2002   dr blanca   mLAD 30%,  CFX OM3 40%,  RCA 30% previous stent site   CARDIOVASCULAR STRESS TEST  04-15-2009  dr blanca   normal nuclear study w/ no ischemia/  normal LV function and wall motion , ef 77%   CATARACT EXTRACTION W/ INTRAOCULAR LENS  IMPLANT, BILATERAL  2013  approx.   COLONOSCOPY WITH PROPOFOL   2014   CORONARY ANGIOPLASTY  1990  dr blanca   PTCA to OM2   CORONARY ANGIOPLASTY  10/ 1996  dr blanca   PTCA to RCA   CORONARY ANGIOPLASTY WITH STENT PLACEMENT  04/ 1996  dr blanca   stenting to RCA   CYSTOSCOPY WITH URETEROSCOPY AND STENT PLACEMENT Right 03/28/2016   Procedure: CYSTOSCOPY WITH RIGHT  URETEROSCOPY AND STENT PLACEMENT;  Surgeon: Donnice Brooks, MD;  Location[facility];  Service: Urology;  Laterality: Right;   ESOPHAGOGASTRODUODENOSCOPY (EGD) WITH PROPOFOL  N/A 07/11/2022   Procedure: ESOPHAGOGASTRODUODENOSCOPY (EGD) WITH PROPOFOL ;  Surgeon: Saintclair Jasper, MD;  Location: [facility] ENDOSCOPY;  Service: Gastroenterology;  Laterality: N/A;   ESOPHAGOGASTRODUODENOSCOPY (EGD) WITH PROPOFOL  N/A 03/20/2023   Procedure: ESOPHAGOGASTRODUODENOSCOPY (EGD) WITH PROPOFOL ;  Surgeon: Saintclair Jasper, MD;  Location: [facility] ENDOSCOPY;  Service: Gastroenterology;  Laterality:  N/A;  with botox  injection s   EXTRACORPOREAL SHOCK WAVE LITHOTRIPSY  yrs ago   HOLMIUM LASER APPLICATION Right 03/28/2016   Procedure: HOLMIUM LASER APPLICATION;  Surgeon: Donnice Brooks, MD;  Location: [facility];  Service: Urology;  Laterality: Right;   INTRAMEDULLARY (IM) NAIL INTERTROCHANTERIC Left 07/07/2022   Procedure: INTRAMEDULLARY (IM) NAILING OF LEFT FEMUR;  Surgeon: Jerri Kay HERO, MD;  Location: [facility] OR;  Service: Orthopedics;  Laterality: Left;   LAPAROSCOPIC CHOLECYSTECTOMY  1990's   SUBMUCOSAL INJECTION  07/11/2022   Procedure: SUBMUCOSAL INJECTION;  Surgeon: Saintclair Jasper, MD;  Location: [facility] ENDOSCOPY;  Service: Gastroenterology;;   TONSILLECTOMY AND ADENOIDECTOMY  child   URETEROLITHOTOMY  1990's   Patient Active Problem List   Diagnosis Date Noted   Palpitations 12/17/2023   Fall 07/06/2022   Displaced intertrochanteric fracture of left femur, initial encounter for closed fracture (HCC) 07/06/2022   CAD (coronary artery disease) 07/06/2022   Coronary artery disease of bypass graft of native heart with stable angina pectoris 05/12/2022   Paving stone retinal degeneration, bilateral 11/03/2021   Senile purpura 09/07/2020   Claudication 03/17/2020   Wears hearing aid    Type 2 diabetes mellitus (HCC)    S/P coronary artery stent placement    Right ureteral stone    Right leg weakness    Lumbar radiculopathy, right    History of kidney stones    DDD (degenerative disc disease), lumbar    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) 11/10/2019   Pseudophakia of both eyes 11/10/2019   Posterior vitreous detachment of both eyes 11/10/2019   Amnesia 09/19/2019   Dyspnea on exertion 08/22/2019   Atypical chest pain 08/22/2019   Diabetic retinopathy associated with type 2 diabetes mellitus (HCC) 03/27/2019   Trigger finger 05/28/2018   Dyslipidemia 01/25/2018   Diabetic peripheral neuropathy associated  with type 2 diabetes mellitus (HCC) 12/12/2017   Chronic bilateral thoracic back pain 12/12/2017   Myofascial pain syndrome 12/12/2017   Foraminal stenosis of lumbar region 12/12/2017   Spondylosis without myelopathy or radiculopathy, lumbar region 12/12/2017   Cough 09/17/2017   Irritability and anger 09/17/2017   Localized edema 08/01/2017   Abnormal gait 01/09/2017   Fatigue 09/12/2016   Hypertensive heart disease without congestive heart failure 09/03/2014   Long term (current) use of insulin  (HCC) 09/03/2014   Ventricular premature depolarization 09/03/2014   Encounter for general adult medical examination without abnormal findings 08/26/2014   Gout 08/28/2013   Impacted cerumen 08/28/2013   Hypo-osmolality and hyponatremia 08/28/2013   Urge incontinence of urine 08/26/2012   Age-related osteoporosis without current pathological fracture 02/19/2012   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   Vitamin D deficiency 02/19/2012   Underimmunization status 12/26/2011   Backache  08/21/2011   Hearing loss 05/23/2011   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) 03/22/2010   Kidney stone 05/25/2009   ED (erectile dysfunction) of organic origin 05/25/2009   Obesity 05/25/2009   Metabolic syndrome 05/25/2009   Polyneuropathy 12/25/2008   Hyperglycemia due to type 2 diabetes mellitus (HCC) 12/25/2008   Gastro-esophageal reflux disease without esophagitis 12/25/2008   Insomnia 12/25/2008   Dilatation of aorta 10/02/2008   Essential hypertension 10/02/2008   Hyperlipidemia 10/02/2008    PCP: Onita Rush, MD   REFERRING PROVIDER: Jerri Kay HERO, MD  REFERRING DIAG: [phone] (ICD-10-CM) - Displaced intertrochanteric fracture of left femur, initial encounter for closed fracture [facility])  THERAPY DIAG:  Pain in left hip  Muscle weakness (generalized)  Difficulty in walking, not elsewhere classified  Rationale for Evaluation and Treatment:  Rehabilitation  ONSET DATE: Jul 19 2022  SUBJECTIVE:   SUBJECTIVE STATEMENT: Patient reports he is ready for discharge. No pain  From Eval: Patient presents with left hip pain that began when he broke his femur Jul 19 2022.  He was in the hospital and did PT at [facility]. He has been traveling and he has gain some weight and he feels he is limping more. Patient reports he had imaging done today and every has healed fine there is just some inflammation. It has been challenging to putting on his shoes, bending, and he feels his balance is off. Previously he was able to walk a mile and now he is limited to 0.5 miles.   PERTINENT HISTORY: Left femur fx May 2024; Type 2 DB; lumbar DDD; peripheral neuropathy; HTN; PAIN:  12/18/23 Are you having pain? Yes: NPRS scale: 0/ 10 Pain location: lateral femur; where his scares or located Pain description: constant soreness; achy Aggravating factors: bending, walking Relieving factors: Tylenol    PRECAUTIONS: None  RED FLAGS: None   WEIGHT BEARING RESTRICTIONS: No  FALLS:  Has patient fallen in last 6 months? Yes. Number of falls 1 he fell in the hospital walking with his walker  LIVING ENVIRONMENT: Lives with: lives with their spouse Lives in: House/apartment Stairs: Yes: Internal: 12 steps; on left going up Has following equipment at home: Walking cane  OCCUPATION: Retired Nutritional therapist  PLOF: Independent, Independent with basic ADLs, Independent with household mobility without device, Independent with community mobility without device, Independent with gait, and Independent with transfers  PATIENT GOALS: To be able to go camping again and be about to walk without a limp  NEXT MD VISIT: PRN  OBJECTIVE:  Note: Objective measures were completed at Evaluation unless otherwise noted.  DIAGNOSTIC FINDINGS: No final results yet  PATIENT SURVEYS:  LEFS: 38/80 47.5%  10/22/2023 LEFS: 56/80 70%  12/04/2023 LEFS 51/80  63.7%  COGNITION: Overall cognitive status: Within functional limits for tasks assessed     SENSATION: WFL   MUSCLE LENGTH: Hamstrings: limited bilateral    POSTURE: rounded shoulders and forward head  PALPATION: Tenderness to palpation Lt TFL, glutes, and quads  LOWER EXTREMITY ROM: WFL; limited Lt hip IR/ER   LOWER EXTREMITY MMT:  MMT Right eval Left eval  Hip flexion 4+ 4-*  Hip extension    Hip abduction 4+ 4-  Hip adduction    Hip internal rotation    Hip external rotation    Knee flexion 4+ 4+  Knee extension 4+ 4shaky  Ankle dorsiflexion    Ankle plantarflexion    Ankle inversion    Ankle eversion     (Blank  rows = not tested)    FUNCTIONAL TESTS:  5 times sit to stand: 10.67 sec no UE support Timed up and go (TUG): 12.27 6 minute walk test: 892FT ; groin pain after 5 mins;   10/18/2023 1039ft . Had some left hip soreness after 4: 30  10/22/2023 TUG: 8.24ft  12/04/2023 928 ft. Patient verbalized pain in both hip joints while walking  12/18/2023 6MWT:1019ft   GAIT: Distance walked: 892 FT Assistive device utilized: None Level of assistance: Complete Independence Comments: decreased step length on Rt; decreased stance time on Lt ; antaglic gait                                                                                                                              TREATMENT DATE:  12/18/2023 NuStep Level 4 8 mins- PT present to discuss status/progress  6MWT:1058ft Goal Assessment Review and Updated of HEP   12/11/2023 Sit to stand holding 7# Unilateral 7# DB hold + march x 20 each side Hip Matrix : (abduction, flexion, extension ) 45# 2 x 10 bilateral  Leg Press 150# bilateral 2 x 10; 100# unilateral x 10 6 in step ups with unilateral 7# DB hold x 20 bilateral  6 inch lateral step ups x 10 bilateral  Single leg on airex + opposite leg swing (forward, lateral, back) x 10 bilateral  Aires step ups + knee drive x 10 bilateral   Farmer's carry 9# DB x 2 laps from ortho gym to cancer rehab gym NuStep Level 4 8 mins- PT present to discuss status/progress a   12/04/2023 NuStep Level 5 6 mins- PT present to discuss status/progress and proper alignment while doing Nustep 12/04/2023 LEFS 51/80 63.7% Goal Assessment : 928 ft. Patient verbalized pain in both hip joints while walking Hip Matrix : (abduction, flexion, extension ) 45# 2 x 10 bilateral  Leg Press 150# bilateral 2 x 10; (able to do 100# on Right) 80# (Lt) x 10 each (seat 7) Standing hamstring stretch with 8 inch step 2 x 30 sec bilateral    PATIENT EDUCATION:  Education details: Ice; POC; examination findings; HEP Person educated: Patient Education method: Explanation, Demonstration, and Handouts Education comprehension: verbalized understanding, returned demonstration, and needs further education  HOME EXERCISE PROGRAM: Access Code: [email or web address] URL: [email or web address] Date: 12/18/2023 Prepared by: Kristeen Sar  Exercises - Supine Gluteus Stretch  - 1 x daily - 7 x weekly - 2 sets - 20-30 hold - Supine Hamstring Stretch with Strap  - 1 x daily - 7 x weekly - 2 sets - 20-30 hold - Supine ITB Stretch with Strap  - 1 x daily - 7 x weekly - 2 sets - 20-30 hold - Seated Long Arc Quad  - 1 x daily - 7 x weekly - 2 sets - 10 reps - 2 hold - Standing Hamstring Stretch on Chair  - 1 x daily - 7 x weekly - 1 sets - 3  reps - 30 sec hold - Theatre manager with Chair and Counter Support  - 1 x daily - 7 x weekly - 1 sets - 3 reps - 30 sec hold - Seated Figure 4 Piriformis Stretch  - 1 x daily - 7 x weekly - 1 sets - 3 reps - 30 sed hold - Standing 3-Way Leg Reach with Resistance at Ankles and Unilateral Counter Support  - 1 x daily - 7 x weekly - 2 sets - 10 reps - Modified Single Leg Balance with Kickstand  - 1 x daily - 7 x weekly - 2 sets - 10 reps - Side Stepping with Resistance at Ankles  - 1 x daily - 7 x weekly - 3 sets - 5-8 reps - Step  Up  - 1 x daily - 7 x weekly - 2 sets - 10 reps - Staggered Sit-to-Stand  - 1 x daily - 7 x weekly - 1 sets - 10 reps - Resisted Sit-to-Stand With Dumbbell at Chest  - 1 x daily - 7 x weekly - 2 sets - 10 reps - Single Leg Balance with Clock Reach  - 1 x daily - 7 x weekly - 1 sets - 10 reps  CLINICAL IMPRESSION: Mr. Deboraha has made great improvements with physical therapy. He is able to do all his functional activities and hobbies without any pain or discomfort. He feels he has gotten stronger with skilled therapy and plans to continue his physical activity after discharge. He has been camping recently and he is able to bend and hook everything up with no difficulty. Reviewed and updated HEP to include final higher level exercises we have been doing. All goals met besides his goal. Patient to discharge home with HEP.    OBJECTIVE IMPAIRMENTS: Abnormal gait, decreased balance, difficulty walking, decreased ROM, decreased strength, increased muscle spasms, impaired flexibility, postural dysfunction, and pain.   ACTIVITY LIMITATIONS: carrying, bending, dressing, and locomotion level  PARTICIPATION LIMITATIONS: cleaning, laundry, community activity, school, and camping/ hiking  PERSONAL FACTORS: Age, Fitness, Time since onset of injury/illness/exacerbation, and 3+ comorbidities: Left femur fx May 2024; Type 2 DB;  peripheral neuropathy; HTN; are also affecting patient's functional outcome.   REHAB POTENTIAL: Good  CLINICAL DECISION MAKING: Stable/uncomplicated  EVALUATION COMPLEXITY: Moderate   GOALS: Goals reviewed with patient? Yes  SHORT TERM GOALS: Target date: 09/26/2023  Patient will be independent with initial HEP. Baseline:  Goal status: MET 10/18/2023  2.  Patient will be able to walk 0.5 miles without left him pain or discomfort. Baseline: increased pain and has to stop Goal status: MET 10/02/23  3.  Patient will ambulate > or = to 1,000 feet during for improved  community ambulation. Baseline: 855ft Goal status: MET 10/18/2023    LONG TERM GOALS: Target date: 12/24/2023  Patient will demonstrate independence in advanced HEP. Baseline:  Goal status: MET 12/18/2023  2.  Patient will be able to walk a mile with no left hip discomfort for improved cardiovascular endurance. Baseline: unable to walk 1 mile Goal status: MET 10/22/2023  3.  Patient will verbalize and demonstrate self-care strategies to manage pain including tissue mobility practices and change of position. Baseline:  Goal status: MET 12/18/2023  4.  Patient will be able to bend and put on his socks and shoes with no left hip discomfort. Baseline: 2/10 pain Goal status: MET (easier and he still has some muscle tightness) 12/18/2023  5.  Patient will perform TUG in < or =  to 9 sec to decrease risk of falls. Baseline: 12.27sec Goal status: MET 10/22/2023  6.  Patient will score 48/80 on LEFS due to improved function of Lt LE. Baseline: 38/80  Goal status: MET 10/22/2023    7.  Patient will ambulate > or = to 1174ft during to be within 60% of age expected norms for test and improve community negotiation.  Baseline: 1014ft  Goal status:NOT MET (1072ft) 12/18/2023   PLAN:  PT FREQUENCY: 1-2x/week  PT DURATION: 6 weeks starting 11/12/2023  PLANNED INTERVENTIONS: 97164- PT Re-evaluation, 97110-Therapeutic exercises, 97530- Therapeutic activity, 97112- Neuromuscular re-education, 97535- Self Care, 02859- Manual therapy, [phone]- Gait training, [phone]- Canalith repositioning, [id number]- Aquatic Therapy, [phone]- Electrical stimulation (unattended), [phone]- Electrical stimulation (manual), [id number]- Ionotophoresis 4mg /ml Dexamethasone , 20560 (1-2 muscles), 20561 (3+ muscles)- Dry Needling, Patient/Family education, Balance training, Stair training, Taping, Joint mobilization, Joint manipulation, Spinal manipulation, Spinal mobilization, Vestibular training, Cryotherapy, and Moist heat  PLAN FOR NEXT  SESSION: patient to discharge home with HEP   Kristeen Sar, PT 12/18/23 10:53 AM [facility] [address], [address] [place], [place] [address] Phone # [phone] Fax [phone]  PHYSICAL THERAPY DISCHARGE SUMMARY  Visits from Start of Care: 16  Current functional level related to goals / functional outcomes: Patient pleased with current functional level and goals met   Remaining deficits: None   Education / Equipment: See above   Patient agrees to discharge. Patient goals were met. Patient is being discharged due to being pleased with the current functional level.

## 2023-12-27 ENCOUNTER — Other Ambulatory Visit: Payer: Self-pay | Admitting: Cardiology

## 2024-01-18 ENCOUNTER — Ambulatory Visit: Payer: Self-pay | Admitting: Cardiology

## 2024-01-18 DIAGNOSIS — I471 Supraventricular tachycardia, unspecified: Secondary | ICD-10-CM

## 2024-01-18 DIAGNOSIS — R002 Palpitations: Secondary | ICD-10-CM | POA: Diagnosis not present

## 2024-01-18 NOTE — Progress Notes (Signed)
 Extra beats mostly from bottom chamber of the heart, episodes of rapid heartbeat both from top and bottom chamber of the heart.  He is already on metoprolol  succinate with well-controlled heart rate.  Continue same.  Recommend echocardiogram to evaluate for heart function.  I would like to have the echocardiogram done before his follow-up visit with me in December.  Thanks MJP

## 2024-01-21 ENCOUNTER — Encounter: Payer: Self-pay | Admitting: Radiology

## 2024-01-22 NOTE — Progress Notes (Signed)
 Thank you all  Thanks MJP

## 2024-03-06 ENCOUNTER — Ambulatory Visit (HOSPITAL_COMMUNITY)

## 2024-03-06 DIAGNOSIS — E785 Hyperlipidemia, unspecified: Secondary | ICD-10-CM | POA: Insufficient documentation

## 2024-03-06 DIAGNOSIS — Z87891 Personal history of nicotine dependence: Secondary | ICD-10-CM | POA: Insufficient documentation

## 2024-03-06 DIAGNOSIS — I2510 Atherosclerotic heart disease of native coronary artery without angina pectoris: Secondary | ICD-10-CM | POA: Insufficient documentation

## 2024-03-06 DIAGNOSIS — Z8551 Personal history of malignant neoplasm of bladder: Secondary | ICD-10-CM | POA: Diagnosis not present

## 2024-03-06 DIAGNOSIS — I10 Essential (primary) hypertension: Secondary | ICD-10-CM

## 2024-03-06 DIAGNOSIS — R0602 Shortness of breath: Secondary | ICD-10-CM | POA: Diagnosis not present

## 2024-03-06 DIAGNOSIS — E119 Type 2 diabetes mellitus without complications: Secondary | ICD-10-CM | POA: Insufficient documentation

## 2024-03-06 DIAGNOSIS — I471 Supraventricular tachycardia, unspecified: Secondary | ICD-10-CM | POA: Insufficient documentation

## 2024-03-06 DIAGNOSIS — R079 Chest pain, unspecified: Secondary | ICD-10-CM

## 2024-03-06 DIAGNOSIS — I119 Hypertensive heart disease without heart failure: Secondary | ICD-10-CM | POA: Insufficient documentation

## 2024-03-06 LAB — ECHOCARDIOGRAM COMPLETE
Area-P 1/2: 2.85 cm2
MV VTI: 1.49 cm2
S' Lateral: 2.5 cm

## 2024-03-06 NOTE — Progress Notes (Unsigned)
°  Cardiology Office Note:  .   Date:  03/06/2024  ID:  Alejandro Foster, DOB 1934-01-02, MRN [id number] PCP: Onita Rush, MD  [facility] Providers Cardiologist:  Newman Lawrence, MD PCP: Onita Rush, MD  No chief complaint on file.    Alejandro Foster is a 89 y.o. male with hypertension, hyperlipidemia type 2 diabetes mellitus, coronary artery disease s/p PCI with Palmar Schatz stents in 1996, mild PAD, dysphagia, mild anemia   Discussed the use of AI scribe software for clinical note transcription with the patient, who gave verbal consent to proceed.  History of Present Illness       There were no vitals filed for this visit.    ROS      Studies Reviewed: .        ***  Echocardiogram 03/06/2024:  1. Left ventricular ejection fraction, by estimation, is 60 to 65%. Left ventricular ejection fraction by 3D volume is 61 %. The left ventricle has normal function. The left ventricle has no regional wall motion abnormalities. There is mild concentric  left ventricular hypertrophy. Left ventricular diastolic parameters are consistent with Grade II diastolic dysfunction (pseudonormalization).  2. Right ventricular systolic function is normal. The right ventricular size is normal. There is normal pulmonary artery systolic pressure. The estimated right ventricular systolic pressure is 30.7 mmHg.  3. Left atrial size was mildly dilated.  4. Right atrial size was mildly dilated.  5. The mitral valve is normal in structure. No evidence of mitral valve regurgitation. No evidence of mitral stenosis.  6. The aortic valve is tricuspid. There is mild calcification of the aortic valve. Aortic valve regurgitation is not visualized. Aortic valve sclerosis/calcification is present, without any evidence of aortic stenosis.  7. Aortic dilatation noted. There is borderline dilatation of the ascending aorta, measuring 39 mm.  8. The inferior vena cava is normal in size with greater than 50%  respiratory variability, suggesting right atrial pressure of 3 mmHg.  Labs ***/202***: Chol ***, TG ***, HDL ***, LDL *** HbA1C ***% Hb *** Cr *** ***  Labs 09/2023: Cr 0.91   8-02/2023: Chol 167, TG 169, HDL 51, LDL 82 HbA1C 8.1% Hb 10.9   06/2022: HbA1C 7.2% Hb 10.9 Cr 0.81 K 3.1, Mg 1.5  Risk Assessment/Calculations:   {Does this patient have ATRIAL FIBRILLATION?:[phone]}    Physical Exam   VISIT DIAGNOSES: No diagnosis found.   Alejandro Foster is a 89 y.o. male with ***hypertension, hyperlipidemia type 2 diabetes mellitus, coronary artery disease s/p PCI with Palmar Schatz stents in 1996, mild PAD, dysphagia, mild anemia  Assessment & Plan       {Are you ordering a CV Procedure (e.g. stress test, cath, DCCV, TEE, etc)?   Press F2        :[id number]}    No orders of the defined types were placed in this encounter.    F/u in ***  Signed, Newman JINNY Lawrence, MD

## 2024-03-10 ENCOUNTER — Other Ambulatory Visit (HOSPITAL_COMMUNITY): Payer: Self-pay

## 2024-03-10 ENCOUNTER — Other Ambulatory Visit (HOSPITAL_BASED_OUTPATIENT_CLINIC_OR_DEPARTMENT_OTHER): Payer: Self-pay

## 2024-03-10 ENCOUNTER — Encounter: Payer: Self-pay | Admitting: Cardiology

## 2024-03-10 ENCOUNTER — Ambulatory Visit: Attending: Cardiology | Admitting: Cardiology

## 2024-03-10 VITALS — BP 120/48 | HR 63 | Ht 67.0 in | Wt 192.0 lb

## 2024-03-10 DIAGNOSIS — I25118 Atherosclerotic heart disease of native coronary artery with other forms of angina pectoris: Secondary | ICD-10-CM | POA: Diagnosis not present

## 2024-03-10 DIAGNOSIS — I5032 Chronic diastolic (congestive) heart failure: Secondary | ICD-10-CM | POA: Diagnosis not present

## 2024-03-10 DIAGNOSIS — I471 Supraventricular tachycardia, unspecified: Secondary | ICD-10-CM | POA: Insufficient documentation

## 2024-03-10 MED ORDER — EMPAGLIFLOZIN 10 MG PO TABS
10.0000 mg | ORAL_TABLET | Freq: Every day | ORAL | 3 refills | Status: AC
  Filled 2024-03-10 (×2): qty 90, 90d supply, fill #0

## 2024-03-10 NOTE — Patient Instructions (Signed)
 Medication Instructions:  STOP hydrochlorothiazide   START Jardiance  10 mg daily   *If you need a refill on your cardiac medications before your next appointment, please call your pharmacy*   Follow-Up: At [facility], you and your health needs are our priority.  As part of our continuing mission to provide you with exceptional heart care, our providers are all part of one team.  This team includes your primary Cardiologist (physician) and Advanced Practice Providers or APPs (Physician Assistants and Nurse Practitioners) who all work together to provide you with the care you need, when you need it.  Your next appointment:   3 month(s)  Provider:   Newman JINNY Lawrence, MD
